# Patient Record
Sex: Female | Born: 1990 | Race: Black or African American | Hispanic: No | Marital: Single | Smoking: Former smoker
Health system: Southern US, Community
[De-identification: ages and names within clinical notes are randomized; demographics above are authoritative.]

## PROBLEM LIST (undated history)

## (undated) ENCOUNTER — Ambulatory Visit (HOSPITAL_COMMUNITY): Payer: Self-pay | Source: Home / Self Care

## (undated) DIAGNOSIS — F419 Anxiety disorder, unspecified: Secondary | ICD-10-CM

## (undated) DIAGNOSIS — E7800 Pure hypercholesterolemia, unspecified: Secondary | ICD-10-CM

## (undated) DIAGNOSIS — Z9221 Personal history of antineoplastic chemotherapy: Secondary | ICD-10-CM

## (undated) DIAGNOSIS — M1990 Unspecified osteoarthritis, unspecified site: Secondary | ICD-10-CM

## (undated) DIAGNOSIS — Z923 Personal history of irradiation: Secondary | ICD-10-CM

## (undated) DIAGNOSIS — G629 Polyneuropathy, unspecified: Secondary | ICD-10-CM

## (undated) DIAGNOSIS — M0890 Juvenile arthritis, unspecified, unspecified site: Secondary | ICD-10-CM

## (undated) DIAGNOSIS — D649 Anemia, unspecified: Secondary | ICD-10-CM

## (undated) HISTORY — DX: Juvenile arthritis, unspecified, unspecified site: M08.90

## (undated) HISTORY — DX: Anemia, unspecified: D64.9

## (undated) HISTORY — DX: Polyneuropathy, unspecified: G62.9

## (undated) HISTORY — PX: DENTAL SURGERY: SHX609

## (undated) HISTORY — DX: Pure hypercholesterolemia, unspecified: E78.00

## (undated) MED FILL — Fosaprepitant Dimeglumine For IV Infusion 150 MG (Base Eq): INTRAVENOUS | Qty: 5 | Status: AC

---
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
## 2011-03-05 LAB — GC/CHLAMYDIA PROBE AMP, GENITAL
Chlamydia, DNA Probe: POSITIVE — AB
GC Probe Amp, Genital: NEGATIVE

## 2011-03-05 LAB — URINE MICROSCOPIC-ADD ON

## 2011-03-05 LAB — URINALYSIS, ROUTINE W REFLEX MICROSCOPIC
Bilirubin Urine: NEGATIVE
Glucose, UA: NEGATIVE mg/dL
Hgb urine dipstick: NEGATIVE
Specific Gravity, Urine: 1.015 (ref 1.005–1.030)
Urobilinogen, UA: 0.2 mg/dL (ref 0.0–1.0)
pH: 7.5 (ref 5.0–8.0)

## 2011-03-05 LAB — WET PREP, GENITAL: Yeast Wet Prep HPF POC: NONE SEEN

## 2013-11-30 ENCOUNTER — Ambulatory Visit (INDEPENDENT_AMBULATORY_CARE_PROVIDER_SITE_OTHER): Payer: Medicaid Other | Admitting: General Practice

## 2013-11-30 ENCOUNTER — Encounter: Payer: Self-pay | Admitting: General Practice

## 2013-11-30 VITALS — BP 106/58 | HR 80 | Temp 98.0°F | Ht 69.25 in | Wt 216.5 lb

## 2013-11-30 DIAGNOSIS — Z0000 Encounter for general adult medical examination without abnormal findings: Secondary | ICD-10-CM

## 2013-11-30 DIAGNOSIS — Z124 Encounter for screening for malignant neoplasm of cervix: Secondary | ICD-10-CM

## 2013-11-30 DIAGNOSIS — Z01419 Encounter for gynecological examination (general) (routine) without abnormal findings: Secondary | ICD-10-CM

## 2013-11-30 LAB — POCT URINALYSIS DIPSTICK
Bilirubin, UA: NEGATIVE
Blood, UA: NEGATIVE
GLUCOSE UA: NEGATIVE
Ketones, UA: NEGATIVE
NITRITE UA: NEGATIVE
Spec Grav, UA: 1.025
UROBILINOGEN UA: NEGATIVE
pH, UA: 6.5

## 2013-11-30 LAB — POCT UA - MICROSCOPIC ONLY
Casts, Ur, LPF, POC: NEGATIVE
Crystals, Ur, HPF, POC: NEGATIVE
RBC, urine, microscopic: NEGATIVE
YEAST UA: NEGATIVE

## 2013-11-30 NOTE — Progress Notes (Signed)
   Subjective:    Patient ID: Gabrielle [name], female    DOB: [birth year], 23 y.o.   MRN: [id number]  HPI Patient presents today for annual exam. She denies any complaints at this time.     Review of Systems  Constitutional: Negative for fever and chills.  Respiratory: Negative for chest tightness and shortness of breath.   Cardiovascular: Negative for chest pain and palpitations.  Genitourinary: Negative for dysuria, frequency, hematuria and difficulty urinating.  Neurological: Negative for dizziness, weakness and headaches.       Objective:   Physical Exam  Constitutional: She is oriented to person, place, and time. She appears well-developed and well-nourished.  HENT:  Head: Normocephalic and atraumatic.  Right Ear: External ear normal.  Left Ear: External ear normal.  Mouth/Throat: Oropharynx is clear and moist.  Eyes: Conjunctivae and EOM are normal. Pupils are equal, round, and reactive to light.  Neck: Normal range of motion. Neck supple. No thyromegaly present.  Cardiovascular: Normal rate, regular rhythm, normal heart sounds and intact distal pulses.   Pulmonary/Chest: Effort normal and breath sounds normal. No respiratory distress. She exhibits no tenderness. Right breast exhibits no inverted nipple, no mass, no nipple discharge, no skin change and no tenderness. Left breast exhibits no inverted nipple, no mass, no nipple discharge, no skin change and no tenderness. Breasts are symmetrical.  Abdominal: Soft. Bowel sounds are normal. She exhibits no distension.  Genitourinary: Vagina normal and uterus normal. No breast swelling, tenderness, discharge or bleeding. No labial fusion. There is no rash, tenderness, lesion or injury on the right labia. There is no rash, tenderness, lesion or injury on the left labia. Uterus is not deviated, not enlarged, not fixed and not tender. Cervix exhibits no motion tenderness, no discharge and no friability. Right adnexum displays no mass, no  tenderness and no fullness. Left adnexum displays no mass, no tenderness and no fullness. No erythema, tenderness or bleeding around the vagina. No foreign body around the vagina. No signs of injury around the vagina. No vaginal discharge found.  Lymphadenopathy:    She has no cervical adenopathy.  Neurological: She is alert and oriented to person, place, and time.  Skin: Skin is warm and dry.  Psychiatric: She has a normal mood and affect.          Assessment & Plan:  1. Annual physical exam - POCT urinalysis dipstick - POCT UA - Microscopic Only - Pap IG, CT/NG w/ reflex HPV when ASC-U -discussed healthy eating and regular exercise RTO prn Patient verbalized understanding Erby Pian, FNP-C

## 2013-11-30 NOTE — Patient Instructions (Signed)

## 2013-12-02 ENCOUNTER — Other Ambulatory Visit: Payer: Self-pay | Admitting: General Practice

## 2013-12-02 DIAGNOSIS — A749 Chlamydial infection, unspecified: Secondary | ICD-10-CM

## 2013-12-02 DIAGNOSIS — N390 Urinary tract infection, site not specified: Secondary | ICD-10-CM

## 2013-12-02 LAB — PAP IG, CT-NG, RFX HPV ASCU
Chlamydia, Nuc. Acid Amp: POSITIVE — AB
Gonococcus by Nucleic Acid Amp: NEGATIVE
PAP Smear Comment: 0

## 2013-12-02 MED ORDER — AZITHROMYCIN 500 MG PO TABS: 1000.0000 mg | ORAL_TABLET | Freq: Once | ORAL | Status: DC

## 2013-12-02 MED ORDER — NITROFURANTOIN MONOHYD MACRO 100 MG PO CAPS: 100.0000 mg | ORAL_CAPSULE | Freq: Two times a day (BID) | ORAL | Status: DC

## 2014-01-28 ENCOUNTER — Telehealth: Payer: Self-pay | Admitting: Family Medicine

## 2014-02-04 ENCOUNTER — Telehealth: Payer: Self-pay | Admitting: General Practice

## 2014-02-04 ENCOUNTER — Ambulatory Visit (INDEPENDENT_AMBULATORY_CARE_PROVIDER_SITE_OTHER): Payer: Medicaid Other | Admitting: Family Medicine

## 2014-02-04 VITALS — BP 101/59 | HR 86 | Temp 98.6°F | Ht 69.25 in | Wt 221.0 lb

## 2014-02-04 DIAGNOSIS — IMO0001 Reserved for inherently not codable concepts without codable children: Secondary | ICD-10-CM

## 2014-02-04 DIAGNOSIS — N898 Other specified noninflammatory disorders of vagina: Secondary | ICD-10-CM

## 2014-02-04 LAB — POCT WET PREP (WET MOUNT)
KOH Wet Prep POC: POSITIVE
Trichomonas Wet Prep HPF POC: NEGATIVE

## 2014-02-04 MED ORDER — FLUCONAZOLE 150 MG PO TABS: 150.0000 mg | ORAL_TABLET | Freq: Once | ORAL | Status: DC

## 2014-02-04 MED ORDER — METRONIDAZOLE 500 MG PO TABS: 500.0000 mg | ORAL_TABLET | Freq: Three times a day (TID) | ORAL | Status: DC

## 2014-02-04 NOTE — Patient Instructions (Signed)

## 2014-02-04 NOTE — Progress Notes (Signed)
   Subjective:    Patient ID: Gabrielle [name], female    DOB: 07-16-1991, 23 y.o.   MRN: [id number]  HPI This 23 y.o. female presents for evaluation of vaginal discharge.  She has concerns about her vagina not being as tight since having her baby.  She is wanting to get her birth control implant changed and needs a referral to obgyn.   Review of Systems C/o vaginal discharge No chest pain, SOB, HA, dizziness, vision change, N/V, diarrhea, constipation, dysuria, urinary urgency or frequency, myalgias, arthralgias or rash.     Objective:   Physical Exam  Vital signs noted  Well developed well nourished female.  HEENT - Head atraumatic Normocephalic                Eyes - PERRLA, Conjuctiva - clear Sclera- Clear EOMI                Ears - EAC's Wnl TM's Wnl Gross Hearing WNL                Throat - oropharanx wnl Respiratory - Lungs CTA bilateral Cardiac - RRR S1 and S2 without murmur GI - Abdomen soft Nontender and bowel sounds active x 4       Assessment & Plan:  Vaginal discharge - Plan: POCT Wet Prep ([place]), metroNIDAZOLE (FLAGYL) 500 MG tablet, fluconazole (DIFLUCAN) 150 MG tablet  Contraception - Plan: Ambulatory referral to Obstetrics / Gynecology  Discussed doing kegel exercises for her vaginal or pelvic changes after child birth.  Lysbeth Penner FNP

## 2014-02-04 NOTE — Telephone Encounter (Signed)
appt made

## 2014-02-19 ENCOUNTER — Emergency Department (HOSPITAL_COMMUNITY)
Admission: EM | Admit: 2014-02-19 | Discharge: 2014-02-19 | Disposition: A | Discharge status: Home / Self Care | Payer: Medicaid Other | Attending: Emergency Medicine | Admitting: Emergency Medicine

## 2014-02-19 ENCOUNTER — Encounter (HOSPITAL_COMMUNITY): Payer: Self-pay | Admitting: Emergency Medicine

## 2014-02-19 DIAGNOSIS — R5381 Other malaise: Secondary | ICD-10-CM | POA: Insufficient documentation

## 2014-02-19 DIAGNOSIS — R5383 Other fatigue: Secondary | ICD-10-CM

## 2014-02-19 DIAGNOSIS — N939 Abnormal uterine and vaginal bleeding, unspecified: Secondary | ICD-10-CM

## 2014-02-19 DIAGNOSIS — R110 Nausea: Secondary | ICD-10-CM | POA: Insufficient documentation

## 2014-02-19 DIAGNOSIS — N390 Urinary tract infection, site not specified: Secondary | ICD-10-CM | POA: Insufficient documentation

## 2014-02-19 DIAGNOSIS — Z3202 Encounter for pregnancy test, result negative: Secondary | ICD-10-CM | POA: Insufficient documentation

## 2014-02-19 DIAGNOSIS — N898 Other specified noninflammatory disorders of vagina: Secondary | ICD-10-CM | POA: Insufficient documentation

## 2014-02-19 LAB — URINALYSIS, ROUTINE W REFLEX MICROSCOPIC
BILIRUBIN URINE: NEGATIVE
Glucose, UA: NEGATIVE mg/dL
Ketones, ur: NEGATIVE mg/dL
LEUKOCYTES UA: NEGATIVE
NITRITE: POSITIVE — AB
PH: 5.5 (ref 5.0–8.0)
Protein, ur: 30 mg/dL — AB
Urobilinogen, UA: 0.2 mg/dL (ref 0.0–1.0)

## 2014-02-19 LAB — URINE MICROSCOPIC-ADD ON

## 2014-02-19 LAB — PREGNANCY, URINE: Preg Test, Ur: NEGATIVE

## 2014-02-19 MED ORDER — ONDANSETRON 8 MG PO TBDP
8.0000 mg | ORAL_TABLET | Freq: Once | ORAL | Status: AC
  Administered 2014-02-19: 8 mg via ORAL
  Filled 2014-02-19: qty 1

## 2014-02-19 MED ORDER — CEPHALEXIN 500 MG PO CAPS: 500.0000 mg | ORAL_CAPSULE | Freq: Four times a day (QID) | ORAL | Status: DC

## 2014-02-19 NOTE — Discharge Instructions (Signed)
Abnormal Uterine Bleeding Abnormal uterine bleeding means bleeding from the vagina that is not your normal menstrual period. This can be:  Bleeding or spotting between periods.  Bleeding after sex (sexual intercourse).  Bleeding that is heavier or more than normal.  Periods that last longer than usual.  Bleeding after menopause. There are many problems that may cause this. Treatment will depend on the cause of the bleeding. Any kind of bleeding that is not normal should be reviewed by your doctor.  HOME CARE Watch your condition for any changes. These actions may lessen any discomfort you are having:  Do not use tampons or douches as told by your doctor.  Change your pads often. You should get regular pelvic exams and Pap tests. Keep all appointments for tests as told by your doctor. GET HELP IF:  You are bleeding for more than 1 week.  You feel dizzy at times. GET HELP RIGHT AWAY IF:   You pass out.  You have to change pads every 15 to 30 minutes.  You have belly pain.  You have a fever.  You become sweaty or weak.  You are passing large blood clots from the vagina.  You feel sick to your stomach (nauseous) and throw up (vomit). MAKE SURE YOU:  Understand these instructions.  Will watch your condition.  Will get help right away if you are not doing well or get worse. Document Released: 09/01/2009 Document Revised: 08/25/2013 Document Reviewed: 06/03/2013 [employer] Patient Information 2014 [employer].  

## 2014-02-19 NOTE — ED Provider Notes (Signed)
CSN: [id number]     Arrival date & time 02/19/14  1823 History  This chart was scribed for Sharyon Cable, MD by Jenne Campus, ED Scribe. This patient was seen in room APA16A/APA16A and the patient's care was started at 7:11 PM.   Chief Complaint  Patient presents with  . Nausea     Patient is a 23 y.o. female presenting with vaginal bleeding. The history is provided by the patient. No language interpreter was used.  Vaginal Bleeding Quality:  Bright red and dark red Severity:  Moderate Onset quality:  Gradual Duration:  2 weeks Timing:  Constant Progression:  Waxing and waning Chronicity:  New Menstrual history:  Regular Context: spontaneously   Associated symptoms: fatigue and nausea   Associated symptoms: no abdominal pain, no dysuria and no fever     HPI Comments: Gabrielle [name] is a 23 y.o. female who presents to the Emergency Department complaining of 2 weeks of constant vaginal bleeding. Pt states that her normal menses starts around the 28th of each month. Pt states that she had a NMP at the end of February, had one week with no bleeding and then started up with the vaginal bleeding once again. She states that the bleeding is slowing down currently but reports that the bleeding will go through cycles of brown blood then back bright red blood the next day. She reports associated nausea and fatigue and expressed concern over a possible pregnancy but denies taking any at home pregnancy tests. She denies any prior episodes of the same but admits that she is currently being treated with a menstrual regulation medication that starts with a "m". She also reports that she was recently treated with antibiotics for a yeast infection and UTI prior to the symptoms onset. She denies any abdominal pain, back pain or dysuria as associated symptoms. She denies any h/o chronic medical conditions.   PMH - none History reviewed. No pertinent past surgical history. Family History  Problem  Relation Age of Onset  . Cancer Maternal Aunt     BREAST  . Cancer Paternal Grandmother     PROSTATE   History  Substance Use Topics  . Smoking status: Never Smoker   . Smokeless tobacco: Not on file  . Alcohol Use: No   No OB history provided.  Review of Systems  Constitutional: Positive for fatigue. Negative for fever.  Gastrointestinal: Positive for nausea. Negative for abdominal pain.  Genitourinary: Positive for vaginal bleeding. Negative for dysuria.  All other systems reviewed and are negative.   Allergies  Review of patient's allergies indicates no known allergies.  Home Medications   Current Outpatient Rx  Name  Route  Sig  Dispense  Refill  . fluconazole (DIFLUCAN) 150 MG tablet   Oral   Take 1 tablet (150 mg total) by mouth once.   2 tablet   1   . metroNIDAZOLE (FLAGYL) 500 MG tablet   Oral   Take 1 tablet (500 mg total) by mouth 3 (three) times daily.   14 tablet   0    Triage Vitals: BP 119/46  Pulse 71  Temp(Src) 97.5 F (36.4 C) (Oral)  Resp 20  SpO2 100%  LMP 02/04/2014  Physical Exam  Nursing note and vitals reviewed.  CONSTITUTIONAL: Well developed/well nourished HEAD: Normocephalic/atraumatic EYES: EOMI/PERRL ENMT: Mucous membranes moist NECK: supple no meningeal signs SPINE:entire spine nontender CV: S1/S2 noted, no murmurs/rubs/gallops noted LUNGS: Lungs are clear to auscultation bilaterally, no apparent distress ABDOMEN: soft, nontender, no  rebound or guarding GU:no cva tenderness NEURO: Pt is awake/alert, moves all extremitiesx4 EXTREMITIES: pulses normal, full ROM SKIN: warm, pallor noted PSYCH: no abnormalities of mood noted  ED Course  Procedures  Medications  ondansetron (ZOFRAN-ODT) disintegrating tablet 8 mg (8 mg Oral Given 02/19/14 1927)    DIAGNOSTIC STUDIES: Oxygen Saturation is 100% on RA, normal by my interpretation.    COORDINATION OF CARE: 7:15 PM-Discussed treatment plan which includes Zofran and UA  with pt at bedside and pt agreed to plan.  Since she is not pregnant, she is refusing all further testing and reqests d/c home I advised need to check HGB due to recent vag bleeding Referred to gynecologist   Labs Review Labs Reviewed  URINALYSIS, [place] - Abnormal; Notable for the following:    Specific Gravity, Urine >1.030 (*)    Hgb urine dipstick LARGE (*)    Protein, ur 30 (*)    Nitrite POSITIVE (*)    All other components within normal limits  URINE MICROSCOPIC-ADD ON - Abnormal; Notable for the following:    Squamous Epithelial / LPF MANY (*)    Bacteria, UA MANY (*)    All other components within normal limits  PREGNANCY, URINE    MDM   Final diagnoses:  Nausea  UTI (lower urinary tract infection)  Vaginal bleeding    Nursing notes including past medical history and social history reviewed and considered in documentation Labs/vital reviewed and considered  I personally performed the services described in this documentation, which was scribed in my presence. The recorded information has been reviewed and is accurate.      Sharyon Cable, MD 02/19/14 [phone]

## 2014-02-19 NOTE — ED Notes (Signed)
Pt c/o intermittent n/v x4-5 days and states "I just don't feel right. I think I might be pregnant".

## 2014-02-19 NOTE — ED Notes (Signed)
Pt refuses the blood draw

## 2014-02-19 NOTE — ED Notes (Signed)
Pt states that she just wants a work note and to find out if shes pregnant bc she feels like she is (frequent urination, not feeling right, nausea, change in period)

## 2014-03-31 ENCOUNTER — Telehealth: Payer: Self-pay | Admitting: Family Medicine

## 2014-04-01 ENCOUNTER — Other Ambulatory Visit: Payer: Self-pay | Admitting: Family Medicine

## 2014-04-01 DIAGNOSIS — N898 Other specified noninflammatory disorders of vagina: Secondary | ICD-10-CM

## 2014-04-01 MED ORDER — METRONIDAZOLE 500 MG PO TABS: 500.0000 mg | ORAL_TABLET | Freq: Three times a day (TID) | ORAL | Status: DC

## 2014-09-26 ENCOUNTER — Telehealth: Payer: Self-pay | Admitting: Nurse Practitioner

## 2014-09-26 NOTE — Telephone Encounter (Signed)
Left message to call back  

## 2014-09-26 NOTE — Telephone Encounter (Signed)
APPT MADE FOR VAG DISCHARGE.

## 2014-10-03 ENCOUNTER — Ambulatory Visit: Payer: Medicaid Other | Admitting: Nurse Practitioner

## 2014-10-05 NOTE — Telephone Encounter (Signed)
Multiple attempts have been made to contact pt, the first line is busy and the 2nd line states the subscriber you are trying to reach is not available, will close encounter

## 2014-11-29 ENCOUNTER — Telehealth: Payer: Self-pay | Admitting: Nurse Practitioner

## 2014-12-08 NOTE — Telephone Encounter (Signed)
Stp regarding this today, she has an appt with MMM 2/9 and will discuss at that appt, offered earlier appt, pt declined, advised pt ok to try and take probiotic otc.

## 2014-12-27 ENCOUNTER — Other Ambulatory Visit: Payer: Medicaid Other | Admitting: Family Medicine

## 2015-02-08 ENCOUNTER — Encounter: Payer: Self-pay | Admitting: Family

## 2015-02-08 ENCOUNTER — Ambulatory Visit (INDEPENDENT_AMBULATORY_CARE_PROVIDER_SITE_OTHER): Payer: Medicaid Other | Admitting: Family

## 2015-02-08 VITALS — BP 116/65 | HR 82 | Temp 98.7°F | Ht 69.25 in | Wt 236.8 lb

## 2015-02-08 DIAGNOSIS — N390 Urinary tract infection, site not specified: Secondary | ICD-10-CM

## 2015-02-08 DIAGNOSIS — N898 Other specified noninflammatory disorders of vagina: Secondary | ICD-10-CM | POA: Diagnosis not present

## 2015-02-08 LAB — POCT WET PREP (WET MOUNT)

## 2015-02-08 LAB — POCT UA - MICROSCOPIC ONLY
CASTS, UR, LPF, POC: NEGATIVE
CRYSTALS, UR, HPF, POC: NEGATIVE
Mucus, UA: NEGATIVE
YEAST UA: NEGATIVE

## 2015-02-08 LAB — POCT URINALYSIS DIPSTICK
BILIRUBIN UA: NEGATIVE
Glucose, UA: NEGATIVE
Ketones, UA: NEGATIVE
NITRITE UA: POSITIVE
PH UA: 6
Spec Grav, UA: >=1.03
UROBILINOGEN UA: NEGATIVE

## 2015-02-08 MED ORDER — SULFAMETHOXAZOLE-TRIMETHOPRIM 800-160 MG PO TABS: 1.0000 | ORAL_TABLET | Freq: Two times a day (BID) | ORAL | Status: DC

## 2015-02-08 NOTE — Patient Instructions (Signed)

## 2015-02-08 NOTE — Progress Notes (Signed)
   Subjective:    Patient ID: Gabrielle [name], female    DOB: [birth year], 24 y.o.   MRN: [id number]  Vaginal Discharge The patient's primary symptoms include a genital odor and vaginal discharge. The patient's pertinent negatives include no genital itching, genital lesions or genital rash. This is a new problem. The current episode started yesterday. The problem occurs constantly. The problem has been waxing and waning. The pain is moderate. The problem affects both sides. Associated symptoms include back pain and frequency. Pertinent negatives include no chills, diarrhea, headaches, hematuria, nausea, sore throat, urgency or vomiting. The vaginal discharge was mucoid. She has tried nothing for the symptoms.      Review of Systems  Constitutional: Negative.  Negative for chills.  HENT: Negative.  Negative for sore throat.   Eyes: Negative.   Respiratory: Negative.  Negative for shortness of breath.   Cardiovascular: Negative.  Negative for palpitations.  Gastrointestinal: Negative.  Negative for nausea, vomiting and diarrhea.  Endocrine: Negative.   Genitourinary: Positive for frequency and vaginal discharge. Negative for urgency and hematuria.  Musculoskeletal: Positive for back pain.  Neurological: Negative.  Negative for headaches.  Hematological: Negative.   Psychiatric/Behavioral: Negative.   All other systems reviewed and are negative.      Objective:   Physical Exam  Constitutional: She is oriented to person, place, and time. She appears well-developed and well-nourished. No distress.  Eyes: Pupils are equal, round, and reactive to light.  Neck: Normal range of motion. Neck supple. No thyromegaly present.  Cardiovascular: Normal rate, regular rhythm, normal heart sounds and intact distal pulses.   No murmur heard. Pulmonary/Chest: Effort normal and breath sounds normal. No respiratory distress. She has no wheezes.  Abdominal: Soft. Bowel sounds are normal. She exhibits no  distension. There is no tenderness.  Musculoskeletal: Normal range of motion. She exhibits no edema or tenderness.  Neurological: She is alert and oriented to person, place, and time. She has normal reflexes. No cranial nerve deficit.  Skin: Skin is warm and dry.  Psychiatric: She has a normal mood and affect. Her behavior is normal. Judgment and thought content normal.  Vitals reviewed.    BP 116/65 mmHg  Pulse 82  Temp(Src) 98.7 F (37.1 C) (Oral)  Ht 5' 9.25" (1.759 m)  Wt 236 lb 12.8 oz (107.412 kg)  BMI 34.72 kg/m2  LMP 01/20/2015 (Approximate)      Assessment & Plan:  1. Vaginal discharge - POCT Wet Prep [facility]) - POCT UA - Microscopic Only - POCT urinalysis dipstick  2. Urinary tract infection without hematuria, site unspecified -Force fluids AZO over the counter X2 days RTO prn - sulfamethoxazole-trimethoprim (BACTRIM DS,SEPTRA DS) 800-160 MG per tablet; Take 1 tablet by mouth 2 (two) times daily.  Dispense: 10 tablet; Refill: 0  Evelina Dun, FNP

## 2015-04-25 ENCOUNTER — Ambulatory Visit: Payer: Medicaid Other | Admitting: Family Medicine

## 2015-04-28 ENCOUNTER — Encounter: Payer: Self-pay | Admitting: Family Medicine

## 2015-09-28 ENCOUNTER — Encounter: Payer: Self-pay | Admitting: Nurse Practitioner

## 2015-09-28 ENCOUNTER — Ambulatory Visit (INDEPENDENT_AMBULATORY_CARE_PROVIDER_SITE_OTHER): Payer: Medicaid Other | Admitting: Nurse Practitioner

## 2015-09-28 ENCOUNTER — Ambulatory Visit: Payer: Medicaid Other | Admitting: Family Medicine

## 2015-09-28 VITALS — BP 115/72 | HR 88 | Temp 97.0°F | Ht 69.0 in | Wt 250.0 lb

## 2015-09-28 DIAGNOSIS — Z09 Encounter for follow-up examination after completed treatment for conditions other than malignant neoplasm: Secondary | ICD-10-CM

## 2015-09-28 DIAGNOSIS — S060X0A Concussion without loss of consciousness, initial encounter: Secondary | ICD-10-CM | POA: Diagnosis not present

## 2015-09-28 DIAGNOSIS — S060X0D Concussion without loss of consciousness, subsequent encounter: Secondary | ICD-10-CM
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
## 2015-09-28 NOTE — Patient Instructions (Signed)
Head Injury, Adult °You have a head injury. Headaches and throwing up (vomiting) are common after a head injury. It should be easy to wake up from sleeping. Sometimes you must stay in the hospital. Most problems happen within the first 24 hours. Side effects may occur up to 7-10 days after the injury.  °WHAT ARE THE TYPES OF HEAD INJURIES? °Head injuries can be as minor as a bump. Some head injuries can be more severe. More severe head injuries include: °· A jarring injury to the brain (concussion). °· A bruise of the brain (contusion). This mean there is bleeding in the brain that can cause swelling. °· A cracked skull (skull fracture). °· Bleeding in the brain that collects, clots, and forms a bump (hematoma). °WHEN SHOULD I GET HELP RIGHT AWAY?  °· You are confused or sleepy. °· You cannot be woken up. °· You feel sick to your stomach (nauseous) or keep throwing up (vomiting). °· Your dizziness or unsteadiness is getting worse. °· You have very bad, lasting headaches that are not helped by medicine. Take medicines only as told by your doctor. °· You cannot use your arms or legs like normal. °· You cannot walk. °· You notice changes in the black spots in the center of the colored part of your eye (pupil). °· You have clear or bloody fluid coming from your nose or ears. °· You have trouble seeing. °During the next 24 hours after the injury, you must stay with someone who can watch you. This person should get help right away (call 911 in the [place] if you start to shake and are not able to control it (have seizures), you pass out, or you are unable to wake up. °HOW CAN I PREVENT A HEAD INJURY IN THE FUTURE? °· Wear seat belts. °· Wear a helmet while bike riding and playing sports like football. °· Stay away from dangerous activities around the house. °WHEN CAN I RETURN TO NORMAL ACTIVITIES AND ATHLETICS? °See your doctor before doing these activities. You should not do normal activities or play contact sports until 1  week after the following symptoms have stopped: °· Headache that does not go away. °· Dizziness. °· Poor attention. °· Confusion. °· Memory problems. °· Sickness to your stomach or throwing up. °· Tiredness. °· Fussiness. °· Bothered by bright lights or loud noises. °· Anxiousness or depression. °· Restless sleep. °MAKE SURE YOU:  °· Understand these instructions. °· Will watch your condition. °· Will get help right away if you are not doing well or get worse. °  °This information is not intended to replace advice given to you by your health care provider. Make sure you discuss any questions you have with your health care provider. °  °Document Released: 10/17/2008 Document Revised: 11/25/2014 Document Reviewed: 07/12/2013 °Elsevier Interactive Patient Education ©2016 [employer] ° °

## 2016-04-11 ENCOUNTER — Telehealth: Payer: Self-pay | Admitting: Family Medicine

## 2016-04-11 ENCOUNTER — Encounter (INDEPENDENT_AMBULATORY_CARE_PROVIDER_SITE_OTHER): Payer: Self-pay

## 2016-04-11 NOTE — Telephone Encounter (Signed)
Pt is concerned about how much hair is falling out and wants to know why it's happening. Advised pt to discuss with the provider tomorrow at her appt. Pt voiced understanding.

## 2016-04-12 ENCOUNTER — Ambulatory Visit: Payer: Medicaid Other | Admitting: Family Medicine

## 2016-04-16 ENCOUNTER — Encounter: Payer: Self-pay | Admitting: Family Medicine

## 2016-05-22 ENCOUNTER — Other Ambulatory Visit: Payer: Self-pay | Admitting: Family Medicine

## 2016-05-23 ENCOUNTER — Other Ambulatory Visit: Payer: Self-pay | Admitting: *Deleted

## 2016-05-23 ENCOUNTER — Telehealth: Payer: Self-pay | Admitting: Family Medicine

## 2016-05-23 MED ORDER — FLUCONAZOLE 150 MG PO TABS: 150.0000 mg | ORAL_TABLET | Freq: Once | ORAL | Status: DC

## 2016-05-23 NOTE — Telephone Encounter (Signed)
Sent to [facility] in [place]

## 2016-05-23 NOTE — Telephone Encounter (Signed)
At pharm - NA / BUSY on pt call

## 2016-05-23 NOTE — Telephone Encounter (Signed)
Pt will contact OB

## 2016-05-23 NOTE — Telephone Encounter (Signed)
She will need to call her OB to talk to them about the next treatment. They may say it is OK to take the fluconazole.

## 2016-05-23 NOTE — Telephone Encounter (Signed)
Can you call pt with below? Thanks

## 2016-05-23 NOTE — Telephone Encounter (Signed)
Needs to call OB and see what they suggest.

## 2016-07-12 ENCOUNTER — Encounter (HOSPITAL_COMMUNITY): Payer: Self-pay | Admitting: Gastroenterology

## 2016-07-12 ENCOUNTER — Emergency Department (HOSPITAL_COMMUNITY)
Admission: EM | Admit: 2016-07-12 | Discharge: 2016-07-12 | Disposition: A | Discharge status: Home / Self Care | Payer: Medicaid Other | Attending: Emergency Medicine | Admitting: Emergency Medicine

## 2016-07-12 DIAGNOSIS — S39012A Strain of muscle, fascia and tendon of lower back, initial encounter: Secondary | ICD-10-CM | POA: Diagnosis not present

## 2016-07-12 DIAGNOSIS — X500XXA Overexertion from strenuous movement or load, initial encounter: Secondary | ICD-10-CM | POA: Diagnosis not present

## 2016-07-12 DIAGNOSIS — O9A212 Injury, poisoning and certain other consequences of external causes complicating pregnancy, second trimester: Secondary | ICD-10-CM | POA: Diagnosis not present

## 2016-07-12 DIAGNOSIS — Y990 Civilian activity done for income or pay: Secondary | ICD-10-CM | POA: Diagnosis not present

## 2016-07-12 DIAGNOSIS — Z3A15 15 weeks gestation of pregnancy: Secondary | ICD-10-CM | POA: Diagnosis not present

## 2016-07-12 DIAGNOSIS — Y929 Unspecified place or not applicable: Secondary | ICD-10-CM | POA: Insufficient documentation

## 2016-07-12 DIAGNOSIS — Y93F2 Activity, caregiving, lifting: Secondary | ICD-10-CM | POA: Insufficient documentation

## 2016-07-12 LAB — URINALYSIS, ROUTINE W REFLEX MICROSCOPIC
Bilirubin Urine: NEGATIVE
GLUCOSE, UA: NEGATIVE mg/dL
Hgb urine dipstick: NEGATIVE
KETONES UR: NEGATIVE mg/dL
NITRITE: NEGATIVE
PH: 8 (ref 5.0–8.0)
Protein, ur: NEGATIVE mg/dL
SPECIFIC GRAVITY, URINE: 1.02 (ref 1.005–1.030)

## 2016-07-12 LAB — URINE MICROSCOPIC-ADD ON: RBC / HPF: NONE SEEN RBC/hpf (ref 0–5)

## 2016-07-12 MED ORDER — ACETAMINOPHEN 325 MG PO TABS
650.0000 mg | ORAL_TABLET | Freq: Once | ORAL | Status: AC
  Administered 2016-07-12: 650 mg via ORAL
  Filled 2016-07-12: qty 2

## 2016-07-12 NOTE — ED Triage Notes (Signed)
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 PT ambulatory from triage with NAD and denies any urinary symptoms. PT also states she is [redacted] weeks pregnant and sees Dr. Adah Perl in [place] as her OBGYN. PT denies any vaginal discharge.

## 2016-07-12 NOTE — ED Notes (Signed)
FHR 184

## 2016-07-12 NOTE — ED Notes (Signed)
Gabrielle [name] at bedside. 

## 2016-07-12 NOTE — Discharge Instructions (Signed)
Alternate ice and heat to your lower back.  Tylenol every 4 hrs as needed.  Follow-up with your OB doctor or return to ER for any worsening symptoms

## 2016-07-12 NOTE — ED Provider Notes (Signed)
[facility] DEPT Provider Note   CSN: [id number] Arrival date & time: 07/12/16  1002     History   Chief Complaint Chief Complaint  Patient presents with  . Back Pain    HPI Gabrielle [name] is a 25 y.o. female.  HPI   Gabrielle [name] is a 25 y.o. female who is [redacted] weeks pregnant, G2 P1Ab0 presents to the Emergency Department complaining of Diffuse low back pain for 2 days. She reports the pain began after pulling and heavy lifting. Pain is exacerbated by certain movements and improves at rest. She denies any radiation of pain, abdominal pain, urine or bowel changes, numbness or weakness to the lower extremities, and vaginal bleeding. She has not taken any medications or tried any therapies for symptomatically relief. Patient reports pregnancy without complications, has an appointment with her OB/GYN on September 21 and currently has routine prenatal care.   History reviewed. No pertinent past medical history.  There are no active problems to display for this patient.   Past Surgical History:  Procedure Laterality Date  . DENTAL SURGERY      OB History    Gravida Para Term Preterm AB Living   3         1   SAB TAB Ectopic Multiple Live Births                  Obstetric Comments   PT states [redacted] weeks gestation       Home Medications    Prior to Admission medications   Medication Sig Start Date End Date Taking? Authorizing Provider  Prenatal Vit-Fe Fumarate-FA (PRENATAL MULTIVITAMIN) TABS tablet Take 1 tablet by mouth daily at 12 noon.   Yes Historical Provider, MD  fluconazole (DIFLUCAN) 150 MG tablet Take 1 tablet (150 mg total) by mouth once. Patient not taking: Reported on 07/12/2016 05/23/16   Eustaquio Maize, MD    Family History Family History  Problem Relation Age of Onset  . Cancer Maternal Aunt     BREAST  . Cancer Paternal Grandmother     PROSTATE    Social History Social History  Substance Use Topics  . Smoking status: Never Smoker  . Smokeless  tobacco: Never Used  . Alcohol use No     Allergies   Review of patient's allergies indicates no known allergies.   Review of Systems Review of Systems  Constitutional: Negative for fever.  Respiratory: Negative for shortness of breath.   Gastrointestinal: Negative for abdominal pain, constipation and vomiting.  Genitourinary: Negative for decreased urine volume, difficulty urinating, dysuria, flank pain and hematuria.  Musculoskeletal: Positive for back pain. Negative for joint swelling.  Skin: Negative for rash.  Neurological: Negative for weakness and numbness.  All other systems reviewed and are negative.    Physical Exam Updated Vital Signs BP 115/70 (BP Location: Right Arm)   Pulse 75   Temp 98 F (36.7 C) (Oral)   Resp 18   Ht 5\' 10"  (1.778 m)   Wt 113.4 kg   SpO2 99%   BMI 35.87 kg/m   Physical Exam  Constitutional: She is oriented to person, place, and time. She appears well-developed and well-nourished. No distress.  HENT:  Head: Normocephalic and atraumatic.  Neck: Normal range of motion. Neck supple.  Cardiovascular: Normal rate, regular rhythm, normal heart sounds and intact distal pulses.   No murmur heard. Pulmonary/Chest: Effort normal and breath sounds normal. No respiratory distress.  Abdominal: Soft. She exhibits no distension. There is no  tenderness.  Musculoskeletal: She exhibits tenderness. She exhibits no edema.       Lumbar back: She exhibits tenderness and pain. She exhibits normal range of motion, no swelling, no deformity, no laceration and normal pulse.  ttp of the lumbar bilateral paraspinal muscles.  No spinal tenderness.  DP pulses are brisk and symmetrical.  Distal sensation intact.  Pt has 5/5 strength against resistance of bilateral lower extremities.     Neurological: She is alert and oriented to person, place, and time. She has normal strength. No sensory deficit. She exhibits normal muscle tone. Coordination and gait normal.    Reflex Scores:      Patellar reflexes are 2+ on the right side and 2+ on the left side.      Achilles reflexes are 2+ on the right side and 2+ on the left side. Skin: Skin is warm and dry. No rash noted.  Nursing note and vitals reviewed.    ED Treatments / Results  Labs (all labs ordered are listed, but only abnormal results are displayed) Labs Reviewed  URINALYSIS, ROUTINE W REFLEX MICROSCOPIC (NOT AT [facility]) - Abnormal; Notable for the following:       Result Value   APPearance HAZY (*)    Leukocytes, UA SMALL (*)    All other components within normal limits  URINE MICROSCOPIC-ADD ON - Abnormal; Notable for the following:    Squamous Epithelial / LPF 0-5 (*)    Bacteria, UA MANY (*)    All other components within normal limits    EKG  EKG Interpretation None       Radiology No results found.  Procedures Procedures (including critical care time)  Medications Ordered in ED Medications  acetaminophen (TYLENOL) tablet 650 mg (650 mg Oral Given 07/12/16 1115)     Initial Impression / Assessment and Plan / ED Course  I have reviewed the triage vital signs and the nursing notes.  Pertinent labs & imaging results that were available during my care of the patient were reviewed by me and considered in my medical decision making (see chart for details).  Clinical Course   FHT's 60  Pt is well appearing.  Ambulates with a steady gait.  Low back pain reproduced with palpation and movement. No focal neuro deficits.  No concerning sx's for emergent neurological or infectious process. Agrees to symptomatic tx and PMD f/u  Final Clinical Impressions(s) / ED Diagnoses   Final diagnoses:  Lumbar strain, initial encounter    New Prescriptions New Prescriptions   No medications on file     Kem Parkinson, PA-C 07/13/16 [place], MD 07/14/16 [phone]

## 2017-05-05 ENCOUNTER — Ambulatory Visit: Payer: Medicaid Other | Admitting: Family Medicine

## 2017-05-06 ENCOUNTER — Ambulatory Visit: Payer: Medicaid Other | Admitting: Family Medicine

## 2017-05-06 ENCOUNTER — Encounter: Payer: Self-pay | Admitting: Family Medicine

## 2017-05-07 ENCOUNTER — Telehealth: Payer: Self-pay | Admitting: Family Medicine

## 2019-10-26 ENCOUNTER — Encounter (HOSPITAL_COMMUNITY): Payer: Self-pay | Admitting: Emergency Medicine

## 2019-10-26 ENCOUNTER — Emergency Department (HOSPITAL_COMMUNITY)
Admission: EM | Admit: 2019-10-26 | Discharge: 2019-10-26 | Disposition: A | Discharge status: Home / Self Care | Payer: Medicaid Other | Attending: Emergency Medicine | Admitting: Emergency Medicine

## 2019-10-26 ENCOUNTER — Other Ambulatory Visit: Payer: Self-pay

## 2019-10-26 DIAGNOSIS — M549 Dorsalgia, unspecified: Secondary | ICD-10-CM | POA: Diagnosis present

## 2019-10-26 MED ORDER — DEXAMETHASONE 4 MG PO TABS
8.0000 mg | ORAL_TABLET | Freq: Once | ORAL | Status: AC
  Administered 2019-10-26: 08:00:00 8 mg via ORAL
  Filled 2019-10-26: qty 2

## 2019-10-26 MED ORDER — OXYCODONE-ACETAMINOPHEN 5-325 MG PO TABS
1.0000 | ORAL_TABLET | Freq: Once | ORAL | Status: AC
  Administered 2019-10-26: 1 via ORAL
  Filled 2019-10-26: qty 1

## 2019-10-26 MED ORDER — IBUPROFEN 200 MG PO TABS
600.0000 mg | ORAL_TABLET | Freq: Once | ORAL | Status: AC
  Administered 2019-10-26: 600 mg via ORAL
  Filled 2019-10-26: qty 3

## 2019-10-26 MED ORDER — DEXAMETHASONE 4 MG PO TABS: 4.0000 mg | ORAL_TABLET | Freq: Two times a day (BID) | ORAL | 0 refills | Status: DC

## 2019-10-26 MED ORDER — IBUPROFEN 600 MG PO TABS: 600.0000 mg | ORAL_TABLET | Freq: Four times a day (QID) | ORAL | 0 refills | Status: DC | PRN

## 2019-10-26 NOTE — ED Triage Notes (Signed)
Pt reports for couple months having left upper back pains. Reports that she lifts at work. Denies any injuries. Using icy hot and tylenol without relief

## 2019-10-26 NOTE — ED Provider Notes (Signed)
[facility] DEPT Provider Note   CSN: [id number] Arrival date & time: 10/26/19  0719     History   Chief Complaint Chief Complaint  Patient presents with  . Back Pain    HPI Gabrielle [name] is a 28 y.o. female.     HPI   28 year old female with back pain.  Upper back somewhat worse on the left side.  Pain began approximately 3 months ago.  Is fairly constant.  Seems to be the worst when she wakes up in the morning.  Worse with movement.  She is tried taking Tylenol and used icy hot with minimal improvement.  She did begin a new job approximately 6 months ago at [employer] and reports that she does regular lifting at this position.  Denies any specific trauma/strain.  No acute respiratory complaints.  Denies any prior history of back problems.  History reviewed. No pertinent past medical history.  There are no active problems to display for this patient.   Past Surgical History:  Procedure Laterality Date  . DENTAL SURGERY       OB History    Gravida  3   Para      Term      Preterm      AB      Living  1     SAB      TAB      Ectopic      Multiple      Live Births           Obstetric Comments  PT states [redacted] weeks gestation         Home Medications    Prior to Admission medications   Medication Sig Start Date End Date Taking? Authorizing Provider  dexamethasone (DECADRON) 4 MG tablet Take 1 tablet (4 mg total) by mouth 2 (two) times daily. 10/26/19   Virgel Manifold, MD  fluconazole (DIFLUCAN) 150 MG tablet Take 1 tablet (150 mg total) by mouth once. Patient not taking: Reported on 07/12/2016 05/23/16   Eustaquio Maize, MD  ibuprofen (ADVIL) 600 MG tablet Take 1 tablet (600 mg total) by mouth every 6 (six) hours as needed. 10/26/19   Virgel Manifold, MD  Prenatal Vit-Fe Fumarate-FA (PRENATAL MULTIVITAMIN) TABS tablet Take 1 tablet by mouth daily at 12 noon.    [provider]    Family History Family  History  Problem Relation Age of Onset  . Cancer Maternal Aunt        BREAST  . Cancer Paternal Grandmother        PROSTATE    Social History Social History   Tobacco Use  . Smoking status: Never Smoker  . Smokeless tobacco: Never Used  Substance Use Topics  . Alcohol use: No  . Drug use: No     Allergies   Patient has no known allergies.   Review of Systems Review of Systems  All systems reviewed and negative, other than as noted in HPI.  Physical Exam Updated Vital Signs BP 112/83   Pulse 70   Temp 98.3 F (36.8 C) (Oral)   Resp 15   LMP 10/18/2019   SpO2 100%   Breastfeeding Unknown   Physical Exam Vitals signs and nursing note reviewed.  Constitutional:      General: She is not in acute distress.    Appearance: She is well-developed.  HENT:     Head: Normocephalic and atraumatic.  Eyes:     General:  Right eye: No discharge.        Left eye: No discharge.     Conjunctiva/sclera: Conjunctivae normal.  Neck:     Musculoskeletal: Neck supple.  Cardiovascular:     Rate and Rhythm: Normal rate and regular rhythm.     Heart sounds: Normal heart sounds. No murmur. No friction rub. No gallop.   Pulmonary:     Effort: Pulmonary effort is normal. No respiratory distress.     Breath sounds: Normal breath sounds.  Abdominal:     General: There is no distension.     Palpations: Abdomen is soft.     Tenderness: There is no abdominal tenderness.  Musculoskeletal:     Comments: Back is grossly normal to inspection.  Mild tenderness to palpation of the left trapezius musculature.  No appreciable midline tenderness.  Breath sounds clear/symmetric bilaterally.  Skin:    General: Skin is warm and dry.  Neurological:     Mental Status: She is alert.  Psychiatric:        Behavior: Behavior normal.        Thought Content: Thought content normal.      ED Treatments / Results  Labs (all labs ordered are listed, but only abnormal results are displayed)  Labs Reviewed - No data to display  EKG EKG Interpretation  Date/Time:  Tuesday October 26 2019 07:28:16 EST Ventricular Rate:  68 PR Interval:    QRS Duration: 71 QT Interval:  409 QTC Calculation: 435 R Axis:   73 Text Interpretation: Sinus rhythm Non-specific ST-t changes No old tracing to compare Confirmed by Virgel Manifold [id number]) on 10/26/2019 7:38:48 AM   Radiology No results found.  Procedures Procedures (including critical care time)  Medications Ordered in ED Medications  oxyCODONE-acetaminophen (PERCOCET/ROXICET) 5-325 MG per tablet 1 tablet (has no administration in time range)  ibuprofen (ADVIL) tablet 600 mg (has no administration in time range)  dexamethasone (DECADRON) tablet 8 mg (has no administration in time range)     Initial Impression / Assessment and Plan / ED Course  I have reviewed the triage vital signs and the nursing notes.  Pertinent labs & imaging results that were available during my care of the patient were reviewed by me and considered in my medical decision making (see chart for details).        28 year old female with 2 to 31-month history of upper back pain.  Seems muscular in nature.  Possibly related to lifting at her job.  I doubt emergent etiology.  Imaging likely of little utility.  Plan symptomatic treatment.  Return precautions discussed.  Outpatient follow-up otherwise.  Final Clinical Impressions(s) / ED Diagnoses   Final diagnoses:  Upper back pain    ED Discharge Orders         Ordered    dexamethasone (DECADRON) 4 MG tablet  2 times daily     10/26/19 0747    ibuprofen (ADVIL) 600 MG tablet  Every 6 hours PRN     10/26/19 0747           Virgel Manifold, MD 10/26/19 [phone]

## 2020-04-01 ENCOUNTER — Other Ambulatory Visit: Payer: Self-pay

## 2020-04-01 ENCOUNTER — Ambulatory Visit
Admission: EM | Admit: 2020-04-01 | Discharge: 2020-04-01 | Disposition: A | Discharge status: Home / Self Care | Payer: Medicaid Other | Attending: Physician Assistant | Admitting: Physician Assistant

## 2020-04-01 DIAGNOSIS — M722 Plantar fascial fibromatosis: Secondary | ICD-10-CM | POA: Diagnosis not present

## 2020-04-01 MED ORDER — METHYLPREDNISOLONE 4 MG PO TBPK: ORAL_TABLET | ORAL | 0 refills | Status: DC

## 2020-04-01 NOTE — ED Triage Notes (Signed)
Patient presents for evaluation of right foot that she states feels tingly and numb on the ball of her foot when she walks for over one month.  She also notes right ankle swelling off/on.  Home interventions have not provided much relief.

## 2020-04-01 NOTE — Discharge Instructions (Signed)
Start medrol pack as directed. Ice compress, stretches. Follow up with ortho/sports medicine if symptoms not improving.

## 2020-04-01 NOTE — ED Provider Notes (Signed)
[name]    CSN: [id number] Arrival date & time: 04/01/20  0945      History   Chief Complaint Chief Complaint  Patient presents with  . Foot Pain    right    HPI Gabrielle [name] is a 29 y.o. female.   29 year old female comes in for 1 month history of right foot pain. Denies injury/trauma. Has ankle swelling that is intermittent. Pain to the plantar surface that is worse first thing in the morning. Work requires long hour of standing and walking. Taking ibuprofen 200-400mg  QD. Tylenol without relief.     History reviewed. No pertinent past medical history.  There are no problems to display for this patient.   Past Surgical History:  Procedure Laterality Date  . DENTAL SURGERY      OB History    Gravida  3   Para      Term      Preterm      AB      Living  1     SAB      TAB      Ectopic      Multiple      Live Births           Obstetric Comments  PT states [redacted] weeks gestation         Home Medications    Prior to Admission medications   Medication Sig Start Date End Date Taking? Authorizing Provider  fluconazole (DIFLUCAN) 150 MG tablet Take 1 tablet (150 mg total) by mouth once. Patient not taking: Reported on 07/12/2016 05/23/16   Eustaquio Maize, MD  ibuprofen (ADVIL) 600 MG tablet Take 1 tablet (600 mg total) by mouth every 6 (six) hours as needed. 10/26/19   Virgel Manifold, MD  methylPREDNISolone (MEDROL DOSEPAK) 4 MG TBPK tablet Follow pack direction 04/01/20   Ok Edwards, PA-C  Prenatal Vit-Fe Fumarate-FA (PRENATAL MULTIVITAMIN) TABS tablet Take 1 tablet by mouth daily at 12 noon.    [provider]    Family History Family History  Problem Relation Age of Onset  . Cancer Maternal Aunt        BREAST  . Cancer Paternal Grandmother        PROSTATE    Social History Social History   Tobacco Use  . Smoking status: Never Smoker  . Smokeless tobacco: Never Used  Substance Use Topics  . Alcohol use: No  .  Drug use: No     Allergies   Patient has no known allergies.   Review of Systems Review of Systems  Reason unable to perform ROS: See HPI as above.     Physical Exam Triage Vital Signs ED Triage Vitals  Enc Vitals Group     BP 04/01/20 1010 123/85     Pulse Rate 04/01/20 1010 77     Resp 04/01/20 1010 15     Temp 04/01/20 1010 98.3 F (36.8 C)     Temp Source 04/01/20 1010 Oral     SpO2 04/01/20 1010 99 %     Weight --      Height --      Head Circumference --      Peak Flow --      Pain Score 04/01/20 1011 9     Pain Loc --      Pain Edu? --      Excl. in [place]? --    No data found.  Updated Vital Signs BP  123/85 (BP Location: Left Arm)   Pulse 77   Temp 98.3 F (36.8 C) (Oral)   Resp 15   LMP 03/20/2020 (Approximate)   SpO2 99%   Visual Acuity Right Eye Distance:   Left Eye Distance:   Bilateral Distance:    Right Eye Near:   Left Eye Near:    Bilateral Near:     Physical Exam Constitutional:      General: She is not in acute distress.    Appearance: Normal appearance. She is well-developed. She is not toxic-appearing or diaphoretic.  HENT:     Head: Normocephalic and atraumatic.  Eyes:     Conjunctiva/sclera: Conjunctivae normal.     Pupils: Pupils are equal, round, and reactive to light.  Pulmonary:     Effort: Pulmonary effort is normal. No respiratory distress.     Comments: Speaking in full sentences without difficulty Musculoskeletal:     Cervical back: Normal range of motion and neck supple.     Comments: No swelling, erythema, warmth, contusion seen.  No tenderness to palpation of ankle or foot.  Tenderness to palpation along plantar surface.  Full range of motion of ankle and toes.  Strength 5/5. Sensation intact and equal bilaterally. Pedal pulse 2+  Skin:    General: Skin is warm and dry.  Neurological:     Mental Status: She is alert and oriented to person, place, and time.      UC Treatments / Results  Labs (all labs ordered  are listed, but only abnormal results are displayed) Labs Reviewed - No data to display  EKG   Radiology No results found.  Procedures Procedures (including critical care time)  Medications Ordered in UC Medications - No data to display  Initial Impression / Assessment and Plan / UC Course  I have reviewed the triage vital signs and the nursing notes.  Pertinent labs & imaging results that were available during my care of the patient were reviewed by me and considered in my medical decision making (see chart for details).    History and exam consistent with plantar fasciitis.  Discussed NSAIDs versus corticosteroid.  Patient would like to start corticosteroid at this time.  Medrol pack as directed.  Other symptomatic management discussed, stretches discussed.  Return precautions given.  Patient expresses understanding and agrees to plan.  Final Clinical Impressions(s) / UC Diagnoses   Final diagnoses:  Plantar fasciitis of right foot    ED Prescriptions    Medication Sig Dispense Auth. Provider   methylPREDNISolone (MEDROL DOSEPAK) 4 MG TBPK tablet Follow pack direction 21 tablet Ok Edwards, PA-C     PDMP not reviewed this encounter.   Ok Edwards, PA-C 04/01/20 1051

## 2020-04-10 ENCOUNTER — Emergency Department (HOSPITAL_COMMUNITY): Payer: Medicaid Other

## 2020-04-10 ENCOUNTER — Emergency Department (HOSPITAL_COMMUNITY)
Admission: EM | Admit: 2020-04-10 | Discharge: 2020-04-10 | Disposition: A | Discharge status: Home / Self Care | Payer: Medicaid Other | Attending: Emergency Medicine | Admitting: Emergency Medicine

## 2020-04-10 ENCOUNTER — Other Ambulatory Visit: Payer: Self-pay

## 2020-04-10 ENCOUNTER — Encounter (HOSPITAL_COMMUNITY): Payer: Self-pay

## 2020-04-10 DIAGNOSIS — M79671 Pain in right foot: Secondary | ICD-10-CM | POA: Insufficient documentation

## 2020-04-10 DIAGNOSIS — M79673 Pain in unspecified foot: Secondary | ICD-10-CM

## 2020-04-10 MED ORDER — NAPROXEN 500 MG PO TABS: 500.0000 mg | ORAL_TABLET | Freq: Two times a day (BID) | ORAL | 0 refills | Status: DC

## 2020-04-10 NOTE — ED Triage Notes (Signed)
Pt c/o right foot pain and numb pinky toe for over a month. Seen by UC and for steroid rx.

## 2020-04-10 NOTE — Discharge Instructions (Addendum)
Recommend taking NSAIDs such as the prescribed naproxen for pain control.  Recommend recheck with orthopedic doctor.  Recommend return to ER if you develop such as worsening pain, numbness, swelling or skin color changes.

## 2020-04-10 NOTE — ED Provider Notes (Signed)
[facility] DEPT Provider Note   CSN: [id number] Arrival date & time: 04/10/20  1843     History Chief Complaint  Patient presents with  . Foot Pain    Gabrielle [name] is a 29 y.o. female.  Presents ER chief complaint foot pain.  Patient reports that over the past month she has been having dull achy right foot pain, states is worse when walking but is able to walk without any significant difficulty.  No known injuries.  Went to urgent care and states that she was given steroid which she has completed.  States that she also has pain and tingling sensation in her pinky and fourth toes.  Has not noted any skin color changes, no swelling.  Has not taken any other medications today for her pain.  HPI     History reviewed. No pertinent past medical history.  There are no problems to display for this patient.   Past Surgical History:  Procedure Laterality Date  . DENTAL SURGERY       OB History    Gravida  3   Para      Term      Preterm      AB      Living  1     SAB      TAB      Ectopic      Multiple      Live Births           Obstetric Comments  PT states [redacted] weeks gestation        Family History  Problem Relation Age of Onset  . Cancer Maternal Aunt        BREAST  . Cancer Paternal Grandmother        PROSTATE    Social History   Tobacco Use  . Smoking status: Never Smoker  . Smokeless tobacco: Never Used  Substance Use Topics  . Alcohol use: No  . Drug use: No    Home Medications Prior to Admission medications   Medication Sig Start Date End Date Taking? Authorizing Provider  fluconazole (DIFLUCAN) 150 MG tablet Take 1 tablet (150 mg total) by mouth once. Patient not taking: Reported on 07/12/2016 05/23/16   Eustaquio Maize, MD  ibuprofen (ADVIL) 600 MG tablet Take 1 tablet (600 mg total) by mouth every 6 (six) hours as needed. 10/26/19   Virgel Manifold, MD  methylPREDNISolone (MEDROL DOSEPAK) 4 MG TBPK tablet  Follow pack direction 04/01/20   Ok Edwards, PA-C  Prenatal Vit-Fe Fumarate-FA (PRENATAL MULTIVITAMIN) TABS tablet Take 1 tablet by mouth daily at 12 noon.    [provider]    Allergies    Patient has no known allergies.  Review of Systems   Review of Systems  Constitutional: Negative for chills and fever.  HENT: Negative for ear pain and sore throat.   Eyes: Negative for pain and visual disturbance.  Respiratory: Negative for cough and shortness of breath.   Cardiovascular: Negative for chest pain and palpitations.  Gastrointestinal: Negative for abdominal pain and vomiting.  Genitourinary: Negative for dysuria and hematuria.  Musculoskeletal: Positive for arthralgias. Negative for back pain.  Skin: Negative for color change and rash.  Neurological: Negative for seizures and syncope.  All other systems reviewed and are negative.   Physical Exam Updated Vital Signs BP 104/70 (BP Location: Left Arm)   Pulse 78   Temp 98.1 F (36.7 C) (Oral)   Resp 16   Ht  5\' 9"  (1.753 m)   Wt 97.5 kg   LMP 03/20/2020 (Approximate)   SpO2 97%   BMI 31.75 kg/m   Physical Exam Vitals and nursing note reviewed.  Constitutional:      Appearance: Normal appearance.  HENT:     Head: Normocephalic and atraumatic.     Nose: Nose normal.  Eyes:     Extraocular Movements: Extraocular movements intact.     Pupils: Pupils are equal, round, and reactive to light.  Cardiovascular:     Rate and Rhythm: Normal rate.     Pulses: Normal pulses.  Pulmonary:     Effort: Pulmonary effort is normal. No respiratory distress.  Musculoskeletal:     Cervical back: Normal range of motion.     Comments: Right lower extremity: Right foot appears normal, there is no focal tenderness on careful palpation, range of motion, sensation to light touch intact throughout foot, toes, normal DP and PT pulses  Skin:    General: Skin is warm and dry.     Capillary Refill: Capillary refill takes less than 2  seconds.  Neurological:     General: No focal deficit present.     Mental Status: She is oriented to person, place, and time.     ED Results / Procedures / Treatments   Labs (all labs ordered are listed, but only abnormal results are displayed) Labs Reviewed - No data to display  EKG None  Radiology DG Foot Complete Right  Result Date: 04/10/2020 CLINICAL DATA:  29 year old female with history of right-sided foot pain. EXAM: RIGHT FOOT COMPLETE - 3+ VIEW COMPARISON:  No priors. FINDINGS: There is no evidence of fracture or dislocation. There is no evidence of arthropathy or other focal bone abnormality. Soft tissues are unremarkable. IMPRESSION: Negative. Electronically Signed   By: Vinnie Langton M.D.   On: 04/10/2020 19:27    Procedures Procedures (including critical care time)  Medications Ordered in ED Medications - No data to display  ED Course  I have reviewed the triage vital signs and the nursing notes.  Pertinent labs & imaging results that were available during my care of the patient were reviewed by me and considered in my medical decision making (see chart for details).    MDM Rules/Calculators/A&P                      29 year old lady presented to ER with right foot pain.  No associated trauma, ongoing for the past month.  X-rays today were negative.  No physical exam abnormality noted on my careful inspection of her foot.  Given the normal exam and the negative x-ray, do not feel patient needs any additional work-up today.  Suspect may have ligamentous strain or other MSK strain.  Recommend that she try course of NSAIDs, follow-up with Ortho.    After the discussed management above, the patient was determined to be safe for discharge.  The patient was in agreement with this plan and all questions regarding their care were answered.  ED return precautions were discussed and the patient will return to the ED with any significant worsening of condition.   Final  Clinical Impression(s) / ED Diagnoses Final diagnoses:  Foot pain  Foot pain, right    Rx / DC Orders ED Discharge Orders    None       Lucrezia Starch, MD 04/10/20 2146

## 2020-04-11 ENCOUNTER — Telehealth: Payer: Self-pay | Admitting: *Deleted

## 2020-04-11 NOTE — Telephone Encounter (Signed)
Pt called regarding which pharmacy Rx was e-scribed to.  RNCM reviewed chart to access After Visit Summary and found that Rx was not written.  Advised pt to contact PCP as the EDP advised NSAIDs for pain.

## 2020-04-13 ENCOUNTER — Other Ambulatory Visit: Payer: Self-pay

## 2020-04-13 ENCOUNTER — Ambulatory Visit
Admission: EM | Admit: 2020-04-13 | Discharge: 2020-04-13 | Disposition: A | Discharge status: Home / Self Care | Payer: Medicaid Other | Attending: Emergency Medicine | Admitting: Emergency Medicine

## 2020-04-13 DIAGNOSIS — M79671 Pain in right foot: Secondary | ICD-10-CM | POA: Diagnosis not present

## 2020-04-13 NOTE — ED Provider Notes (Signed)
[name]    CSN: [id number] Arrival date & time: 04/13/20  0943      History   Chief Complaint Chief Complaint  Patient presents with  . Foot Pain    HPI Gabrielle [name] is a 29 y.o. female presenting for persistent right foot pain with lateral numbness.  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  Patient previously seen in urgent care setting on 5/15: Given steroid for plantar fasciitis.  Went to the ER on 5/24: X-ray negative, physical exam reassuring.  Advised to take NSAIDs, follow-up with Ortho.  Patient states since ER visit pain has persisted.  Denies repeat traumatization.  Has tried wearing better shoes, though states that it hurts her foot.   History reviewed. No pertinent past medical history.  There are no problems to display for this patient.   Past Surgical History:  Procedure Laterality Date  . DENTAL SURGERY      OB History    Gravida  3   Para      Term      Preterm      AB      Living  1     SAB      TAB      Ectopic      Multiple      Live Births           Obstetric Comments  PT states [redacted] weeks gestation         Home Medications    Prior to Admission medications   Medication Sig Start Date End Date Taking? Authorizing Provider  ibuprofen (ADVIL) 600 MG tablet Take 1 tablet (600 mg total) by mouth every 6 (six) hours as needed. 10/26/19   Virgel Manifold, MD  naproxen (NAPROSYN) 500 MG tablet Take 1 tablet (500 mg total) by mouth 2 (two) times daily. 04/10/20   Lucrezia Starch, MD  Prenatal Vit-Fe Fumarate-FA (PRENATAL MULTIVITAMIN) TABS tablet Take 1 tablet by mouth daily at 12 noon.    [provider]    Family History Family History  Problem Relation Age of Onset  . Cancer Maternal Aunt        BREAST  . Cancer Paternal Grandmother        PROSTATE    Social History Social History   Tobacco Use  . Smoking status: Never Smoker    . Smokeless tobacco: Never Used  Substance Use Topics  . Alcohol use: No  . Drug use: No     Allergies   Patient has no known allergies.   Review of Systems As per HPI   Physical Exam Triage Vital Signs ED Triage Vitals  Enc Vitals Group     BP      Pulse      Resp      Temp      Temp src      SpO2      Weight      Height      Head Circumference      Peak Flow      Pain Score      Pain Loc      Pain Edu?      Excl. in [place]?    No data found.  Updated Vital Signs BP (!) 121/56   Pulse 80   Temp 98 F (36.7 C)   Resp 16  LMP 03/20/2020 (Approximate)   SpO2 98%   Visual Acuity Right Eye Distance:   Left Eye Distance:   Bilateral Distance:    Right Eye Near:   Left Eye Near:    Bilateral Near:     Physical Exam Constitutional:      General: She is not in acute distress. HENT:     Head: Normocephalic and atraumatic.  Eyes:     General: No scleral icterus.    Pupils: Pupils are equal, round, and reactive to light.  Cardiovascular:     Rate and Rhythm: Normal rate.  Pulmonary:     Effort: Pulmonary effort is normal.  Musculoskeletal:        General: Tenderness present. No swelling or deformity. Normal range of motion.     Right lower leg: No edema.     Left lower leg: No edema.  Skin:    Coloration: Skin is not jaundiced or pale.  Neurological:     Mental Status: She is alert and oriented to person, place, and time.      UC Treatments / Results  Labs (all labs ordered are listed, but only abnormal results are displayed) Labs Reviewed - No data to display  EKG   Radiology No results found.  Procedures Procedures (including critical care time)  Medications Ordered in UC Medications - No data to display  Initial Impression / Assessment and Plan / UC Course  I have reviewed the triage vital signs and the nursing notes.  Pertinent labs & imaging results that were available during my care of the patient were reviewed by me and  considered in my medical decision making (see chart for details).     Patient denies repeat traumatization since 5/24 ER visit: Repeat radiography deferred.  Exam unremarkable, though patient tearful second to pain.  Applied cam walker in office which patient tolerated well.  Stressed importance of follow-up with orthopedics for further evaluation/management.  Return precautions discussed, patient verbalized understanding and is agreeable to plan. Final Clinical Impressions(s) / UC Diagnoses   Final diagnoses:  Right foot pain     Discharge Instructions     Recommend RICE: rest, ice, compression, elevation as needed for pain.    Heat therapy (hot compress, warm wash rag, hot showers, etc.) can help relax muscles and soothe muscle aches. Cold therapy (ice packs) can be used to help swelling both after injury and after prolonged use of areas of chronic pain/aches.  For pain: Continue naproxen, may take Tylenol additionally if needed.  Very important that you follow-up with orthopedics for further evaluation.    ED Prescriptions    None     I have reviewed the PDMP during this encounter.   [name], Gabrielle, [place] 04/13/20 1013

## 2020-04-13 NOTE — ED Triage Notes (Signed)
Pt c/o right foot pain and numbness, states unable to tolerate work. Has not followed up with ortho. Naproxen prescribed in ER not helping.

## 2020-04-13 NOTE — Discharge Instructions (Addendum)
Recommend RICE: rest, ice, compression, elevation as needed for pain.    Heat therapy (hot compress, warm wash rag, hot showers, etc.) can help relax muscles and soothe muscle aches. Cold therapy (ice packs) can be used to help swelling both after injury and after prolonged use of areas of chronic pain/aches.  For pain: Continue naproxen, may take Tylenol additionally if needed.  Very important that you follow-up with orthopedics for further evaluation.

## 2020-05-12 ENCOUNTER — Telehealth: Payer: Self-pay

## 2020-05-12 NOTE — Telephone Encounter (Signed)

## 2020-05-15 ENCOUNTER — Ambulatory Visit: Payer: Medicaid Other | Admitting: Internal Medicine

## 2020-09-27 ENCOUNTER — Other Ambulatory Visit: Payer: Self-pay

## 2020-09-27 ENCOUNTER — Ambulatory Visit
Admission: EM | Admit: 2020-09-27 | Discharge: 2020-09-27 | Disposition: A | Discharge status: Home / Self Care | Payer: Medicaid Other | Attending: Family Medicine | Admitting: Family Medicine

## 2020-09-27 ENCOUNTER — Telehealth: Payer: Medicaid Other | Admitting: Family

## 2020-09-27 DIAGNOSIS — B349 Viral infection, unspecified: Secondary | ICD-10-CM

## 2020-09-27 DIAGNOSIS — Z20822 Contact with and (suspected) exposure to covid-19: Secondary | ICD-10-CM

## 2020-09-27 MED ORDER — CYCLOBENZAPRINE HCL 10 MG PO TABS: 10.0000 mg | ORAL_TABLET | Freq: Two times a day (BID) | ORAL | 0 refills | Status: DC | PRN

## 2020-09-27 MED ORDER — DICYCLOMINE HCL 20 MG PO TABS: 20.0000 mg | ORAL_TABLET | Freq: Two times a day (BID) | ORAL | 0 refills | Status: DC

## 2020-09-27 NOTE — ED Triage Notes (Signed)
Pt states she has had generalized body aches, a headache, and abdominal pain x 4-5 days. Pt also complains of loose stools and body soreness. Pt is aox4 and ambulatory.

## 2020-09-27 NOTE — ED Provider Notes (Signed)
[name]    CSN: [id number] Arrival date & time: 09/27/20  1056      History   Chief Complaint Chief Complaint  Patient presents with   Generalized Body Aches    x 4 days   Headache    x 4 days   Abdominal Pain    x 4 days    HPI Gabrielle [name] is a 29 y.o. female.   Patient presents with myalgias headache abdominal pain and diarrhea.  Cough is been minimal if any.  Denies sore throat.  Not aware of any fever although she does have some low-grade fever this morning.  No known exposure to similar illness but she does have a son who is in school.  HPI  History reviewed. No pertinent past medical history.  There are no problems to display for this patient.   Past Surgical History:  Procedure Laterality Date   DENTAL SURGERY      OB History    Gravida  3   Para      Term      Preterm      AB      Living  1     SAB      TAB      Ectopic      Multiple      Live Births           Obstetric Comments  PT states [redacted] weeks gestation         Home Medications    Prior to Admission medications   Medication Sig Start Date End Date Taking? Authorizing Provider  naproxen (NAPROSYN) 500 MG tablet Take 1 tablet (500 mg total) by mouth 2 (two) times daily. 04/10/20   Lucrezia Starch, MD  Prenatal Vit-Fe Fumarate-FA (PRENATAL MULTIVITAMIN) TABS tablet Take 1 tablet by mouth daily at 12 noon.    [provider]    Family History Family History  Problem Relation Age of Onset   Cancer Maternal Aunt        BREAST   Cancer Paternal Grandmother        PROSTATE    Social History Social History   Tobacco Use   Smoking status: Never Smoker   Smokeless tobacco: Never Used  Scientific laboratory technician Use: Never used  Substance Use Topics   Alcohol use: No   Drug use: No     Allergies   Patient has no known allergies.   Review of Systems Review of Systems  Constitutional: Positive for appetite change and fatigue.    Gastrointestinal: Positive for abdominal pain and diarrhea.  Musculoskeletal: Positive for myalgias.  All other systems reviewed and are negative.    Physical Exam Triage Vital Signs ED Triage Vitals  Enc Vitals Group     BP --      Pulse Rate 09/27/20 1220 74     Resp 09/27/20 1220 18     Temp 09/27/20 1220 98.3 F (36.8 C)     Temp Source 09/27/20 1220 Oral     SpO2 09/27/20 1220 99 %     Weight --      Height --      Head Circumference --      Peak Flow --      Pain Score 09/27/20 1222 9     Pain Loc --      Pain Edu? --      Excl. in [place]? --    No data found.  Updated  Vital Signs Pulse 74    Temp 98.3 F (36.8 C) (Oral)    Resp 18    LMP 09/24/2020 (Exact Date)    SpO2 99%   Visual Acuity Right Eye Distance:   Left Eye Distance:   Bilateral Distance:    Right Eye Near:   Left Eye Near:    Bilateral Near:     Physical Exam Vitals and nursing note reviewed.  Constitutional:      Appearance: She is well-developed. She is obese.  Cardiovascular:     Rate and Rhythm: Normal rate and regular rhythm.  Pulmonary:     Effort: Pulmonary effort is normal.     Breath sounds: Normal breath sounds.  Abdominal:     General: Bowel sounds are normal.     Palpations: Abdomen is soft.     Tenderness: There is no abdominal tenderness. There is no guarding.  Musculoskeletal:     Cervical back: Normal range of motion.  Neurological:     Mental Status: She is alert.  Psychiatric:        Mood and Affect: Mood normal.        Behavior: Behavior normal.      UC Treatments / Results  Labs (all labs ordered are listed, but only abnormal results are displayed) Labs Reviewed - No data to display  EKG   Radiology No results found.  Procedures Procedures (including critical care time)  Medications Ordered in UC Medications - No data to display  Initial Impression / Assessment and Plan / UC Course  I have reviewed the triage vital signs and the nursing  notes.  Pertinent labs & imaging results that were available during my care of the patient were reviewed by me and considered in my medical decision making (see chart for details).     Flulike illness.  Will treat symptomatically with medication for abdominal pains and myalgias and Tylenol for fever Final Clinical Impressions(s) / UC Diagnoses   Final diagnoses:  None   Discharge Instructions   None    ED Prescriptions    None     PDMP not reviewed this encounter.   Wardell Honour, MD 09/27/20 [phone]

## 2020-09-27 NOTE — Progress Notes (Signed)
E-Visit for Corona Virus Screening  Your current symptoms could be consistent with the coronavirus.  Many health care providers can now test patients at their office but not all are.  [place] has multiple testing sites. For information on our [place] testing locations and hours go to [email or web address]  We are enrolling you in our [place] for [place] . Daily you will receive a questionnaire within the [place] website. Our COVID 19 response team will be monitoring your responses daily.  Testing Information: The COVID-19 Community Testing sites are testing BY APPOINTMENT ONLY.  You can schedule online at [email or web address]  If you do not have access to a smart phone or computer you may call [phone] for an appointment.   Additional testing sites in the Community:  . For [facility] Testing sites in [facility]  FaceUpdate.[email or web address]  . For Pop-up testing sites in [place]  BowlDirectory.co.uk  . For [facility] [email or web address]  . For [facility] testing in [place] and [employer] [email or web address]  . For [facility] testing in [facility]   [email or web address]  For  more information about community testing call [phone]   Please quarantine yourself while awaiting your test results. Please stay home for a minimum of 10 days from the first day of illness with improving symptoms and you have had 24 hours of no fever (without the use of Tylenol (Acetaminophen) Motrin (Ibuprofen) or any fever reducing medication).  Also - Do not get tested prior to returning to work because once you have had a positive test the test can stay  positive for more than a month in some cases.   You should wear a mask or cloth face covering over your nose and mouth if you must be around other people or animals, including pets (even at home). Try to stay at least 6 feet away from other people. This will protect the people around you.  Please continue good preventive care measures, including:  frequent hand-washing, avoid touching your face, cover coughs/sneezes, stay out of crowds and keep a 6 foot distance from others.  COVID-19 is a respiratory illness with symptoms that are similar to the flu. Symptoms are typically mild to moderate, but there have been cases of severe illness and death due to the virus.   The following symptoms may appear 2-14 days after exposure: . Fever . Cough . Shortness of breath or difficulty breathing . Chills . Repeated shaking with chills . Muscle pain . Headache . Sore throat . New loss of taste or smell . Fatigue . Congestion or runny nose . Nausea or vomiting . Diarrhea  Go to the nearest hospital ED for assessment if fever/cough/breathlessness are severe or illness seems like a threat to life.  It is vitally important that if you feel that you have an infection such as this virus or any other virus that you stay home and away from places where you may spread it to others.  You should avoid contact with people age 55 and older.   You can use medication such as I have prescribed Naprosyn 500 mg. Take twice daily as needed for fever or body aches for 2 weeks  You may also take acetaminophen (Tylenol) as needed for fever.  Reduce your risk of any infection by using the same precautions used for avoiding the common cold or flu:  Marland Kitchen Wash your hands often with soap and warm water for at least 20 seconds.  If soap and water are not readily available,  use an alcohol-based hand sanitizer with at least 60% alcohol.  . If coughing or sneezing, cover your mouth and nose by coughing or sneezing into the elbow areas  of your shirt or coat, into a tissue or into your sleeve (not your hands). . Avoid shaking hands with others and consider head nods or verbal greetings only. . Avoid touching your eyes, nose, or mouth with unwashed hands.  . Avoid close contact with people who are sick. . Avoid places or events with large numbers of people in one location, like concerts or sporting events. . Carefully consider travel plans you have or are making. . If you are planning any travel outside or inside the [place], visit the CDC's Travelers' Health webpage for the latest health notices. . If you have some symptoms but not all symptoms, continue to monitor at home and seek medical attention if your symptoms worsen. . If you are having a medical emergency, call 911.  HOME CARE . Only take medications as instructed by your medical team. . Drink plenty of fluids and get plenty of rest. . A steam or ultrasonic humidifier can help if you have congestion.   GET HELP RIGHT AWAY IF YOU HAVE EMERGENCY WARNING SIGNS** FOR COVID-19. If you or someone is showing any of these signs seek emergency medical care immediately. Call 911 or proceed to your closest emergency facility if: . You develop worsening high fever. . Trouble breathing . Bluish lips or face . Persistent pain or pressure in the chest . New confusion . Inability to wake or stay awake . You cough up blood. . Your symptoms become more severe  **This list is not all possible symptoms. Contact your medical provider for any symptoms that are sever or concerning to you.  MAKE SURE YOU   Understand these instructions.  Will watch your condition.  Will get help right away if you are not doing well or get worse.  Your e-visit answers were reviewed by a board certified advanced clinical practitioner to complete your personal care plan.  Depending on the condition, your plan could have included both over the counter or prescription medications.  If there is a problem  please reply once you have received a response from your provider.  Your safety is important to [place].  If you have drug allergies check your prescription carefully.    You can use MyChart to ask questions about today's visit, request a non-urgent call back, or ask for a work or school excuse for 24 hours related to this e-Visit. If it has been greater than 24 hours you will need to follow up with your provider, or enter a new e-Visit to address those concerns. You will get an e-mail in the next two days asking about your experience.  I hope that your e-visit has been valuable and will speed your recovery. Thank you for using e-visits.   Approximately 5 minutes was spent documenting and reviewing patient's chart.

## 2021-01-15 ENCOUNTER — Encounter: Payer: Self-pay | Admitting: *Deleted

## 2021-01-15 ENCOUNTER — Ambulatory Visit
Admission: EM | Admit: 2021-01-15 | Discharge: 2021-01-15 | Disposition: A | Discharge status: Home / Self Care | Payer: Medicaid Other | Attending: Emergency Medicine | Admitting: Emergency Medicine

## 2021-01-15 ENCOUNTER — Other Ambulatory Visit: Payer: Self-pay

## 2021-01-15 DIAGNOSIS — Z1152 Encounter for screening for COVID-19: Secondary | ICD-10-CM | POA: Diagnosis not present

## 2021-01-15 DIAGNOSIS — J069 Acute upper respiratory infection, unspecified: Secondary | ICD-10-CM

## 2021-01-15 DIAGNOSIS — G4700 Insomnia, unspecified: Secondary | ICD-10-CM | POA: Insufficient documentation

## 2021-01-15 DIAGNOSIS — Z20822 Contact with and (suspected) exposure to covid-19: Secondary | ICD-10-CM | POA: Insufficient documentation

## 2021-01-15 DIAGNOSIS — N898 Other specified noninflammatory disorders of vagina: Secondary | ICD-10-CM | POA: Diagnosis not present

## 2021-01-15 DIAGNOSIS — Z113 Encounter for screening for infections with a predominantly sexual mode of transmission: Secondary | ICD-10-CM | POA: Insufficient documentation

## 2021-01-15 LAB — POCT URINE PREGNANCY: Preg Test, Ur: NEGATIVE

## 2021-01-15 LAB — POCT RAPID STREP A (OFFICE): Rapid Strep A Screen: NEGATIVE

## 2021-01-15 MED ORDER — IBUPROFEN 800 MG PO TABS: 800.0000 mg | ORAL_TABLET | Freq: Three times a day (TID) | ORAL | 0 refills | Status: DC

## 2021-01-15 MED ORDER — BENZONATATE 200 MG PO CAPS: 200.0000 mg | ORAL_CAPSULE | Freq: Three times a day (TID) | ORAL | 0 refills | Status: AC | PRN

## 2021-01-15 MED ORDER — FLUTICASONE PROPIONATE 50 MCG/ACT NA SUSP: 1.0000 | Freq: Every day | NASAL | 0 refills | Status: DC

## 2021-01-15 MED ORDER — CETIRIZINE HCL 10 MG PO CAPS: 10.0000 mg | ORAL_CAPSULE | Freq: Every day | ORAL | 0 refills | Status: DC

## 2021-01-15 MED ORDER — HYDROXYZINE HCL 25 MG PO TABS: 25.0000 mg | ORAL_TABLET | Freq: Every evening | ORAL | 0 refills | Status: DC | PRN

## 2021-01-15 NOTE — ED Triage Notes (Signed)
Pt reports starting 2 days ago developing a sore throat, cough and HA. Pt also has a vag discharge.

## 2021-01-15 NOTE — ED Provider Notes (Signed)
[name]    CSN: [id number] Arrival date & time: 01/15/21  0806      History   Chief Complaint Chief Complaint  Patient presents with  . Sore Throat  . Cough  . Headache  . Vaginal Discharge    HPI Gabrielle [name] is a 30 y.o. female presenting today for evaluation of URI symptoms.  Reports associated cough, headache and sore throat.  Symptoms began 2 days ago.  Reports a lot of frontal headache/sinus pressure congestion drainage and cough.  Denies fevers.  Denies known sick contacts.    Also reports associated discharge.  Unsure as this discharge is normal for her.  Denies any significant itching irritation or burning associated with it.  Also reports poor sleep over the past 2 weeks.  Reports very fragmented and often will only have approximately 1 hour sustained sleep.  Feels very drowsy during the day and fatigued.  Denies any specific thoughts keeping her up, but does report she is in school and does have stress.  HPI  History reviewed. No pertinent past medical history.  There are no problems to display for this patient.   Past Surgical History:  Procedure Laterality Date  . DENTAL SURGERY      OB History    Gravida  3   Para      Term      Preterm      AB      Living  1     SAB      IAB      Ectopic      Multiple      Live Births           Obstetric Comments  PT states [redacted] weeks gestation         Home Medications    Prior to Admission medications   Medication Sig Start Date End Date Taking? Authorizing Provider  benzonatate (TESSALON) 200 MG capsule Take 1 capsule (200 mg total) by mouth 3 (three) times daily as needed for up to 7 days for cough. 01/15/21 01/22/21 Yes Rajah Tagliaferro C, PA-C  Cetirizine HCl 10 MG CAPS Take 1 capsule (10 mg total) by mouth daily for 10 days. 01/15/21 01/25/21 Yes Rhianna Raulerson C, PA-C  fluticasone (FLONASE) 50 MCG/ACT nasal spray Place 1-2 sprays into both nostrils daily. 01/15/21  Yes Gracianna Vink,  Phelan Schadt C, PA-C  hydrOXYzine (ATARAX/VISTARIL) 25 MG tablet Take 1 tablet (25 mg total) by mouth at bedtime as needed for anxiety (insomnia). 01/15/21  Yes Coulton Schlink C, PA-C  ibuprofen (ADVIL) 800 MG tablet Take 1 tablet (800 mg total) by mouth 3 (three) times daily. 01/15/21  Yes Antonya Leeder C, PA-C  cyclobenzaprine (FLEXERIL) 10 MG tablet Take 1 tablet (10 mg total) by mouth 2 (two) times daily as needed for muscle spasms. Take as needed for aching 09/27/20   Wardell Honour, MD  dicyclomine (BENTYL) 20 MG tablet Take 1 tablet (20 mg total) by mouth 2 (two) times daily. 09/27/20   Wardell Honour, MD  naproxen (NAPROSYN) 500 MG tablet Take 1 tablet (500 mg total) by mouth 2 (two) times daily. 04/10/20   Lucrezia Starch, MD  Prenatal Vit-Fe Fumarate-FA (PRENATAL MULTIVITAMIN) TABS tablet Take 1 tablet by mouth daily at 12 noon.    [provider]    Family History Family History  Problem Relation Age of Onset  . Cancer Maternal Aunt        BREAST  . Cancer Paternal Grandmother  PROSTATE    Social History Social History   Tobacco Use  . Smoking status: Never Smoker  . Smokeless tobacco: Never Used  Vaping Use  . Vaping Use: Never used  Substance Use Topics  . Alcohol use: No  . Drug use: No     Allergies   Patient has no known allergies.   Review of Systems Review of Systems  Constitutional: Negative for activity change, appetite change, chills, fatigue and fever.  HENT: Positive for congestion, rhinorrhea and sore throat. Negative for ear pain, sinus pressure and trouble swallowing.   Eyes: Negative for discharge and redness.  Respiratory: Positive for cough. Negative for chest tightness and shortness of breath.   Cardiovascular: Negative for chest pain.  Gastrointestinal: Negative for abdominal pain, diarrhea, nausea and vomiting.  Genitourinary: Positive for vaginal discharge.  Musculoskeletal: Negative for myalgias.  Skin: Negative for  rash.  Neurological: Negative for dizziness, light-headedness and headaches.  Psychiatric/Behavioral: Positive for sleep disturbance.     Physical Exam Triage Vital Signs ED Triage Vitals [01/15/21 0815]  Enc Vitals Group     BP      Pulse      Resp      Temp      Temp src      SpO2      Weight      Height      Head Circumference      Peak Flow      Pain Score 8     Pain Loc      Pain Edu?      Excl. in [place]?    No data found.  Updated Vital Signs BP 106/72 (BP Location: Left Arm)   Pulse 69   Temp 98.5 F (36.9 C) (Oral)   Resp 16   LMP 01/01/2021   SpO2 98%   Visual Acuity Right Eye Distance:   Left Eye Distance:   Bilateral Distance:    Right Eye Near:   Left Eye Near:    Bilateral Near:     Physical Exam Vitals and nursing note reviewed.  Constitutional:      Appearance: She is well-developed and well-nourished.     Comments: No acute distress  HENT:     Head: Normocephalic and atraumatic.     Ears:     Comments: Bilateral ears without tenderness to palpation of external auricle, tragus and mastoid, EAC's without erythema or swelling, TM's with good bony landmarks and cone of light. Non erythematous.     Nose: Nose normal.     Mouth/Throat:     Comments: Oral mucosa pink and moist, no tonsillar enlargement or exudate. Posterior pharynx patent and nonerythematous, no uvula deviation or swelling. Normal phonation.  Eyes:     Conjunctiva/sclera: Conjunctivae normal.  Cardiovascular:     Rate and Rhythm: Normal rate.  Pulmonary:     Effort: Pulmonary effort is normal. No respiratory distress.     Comments: Breathing comfortably at rest, CTABL, no wheezing, rales or other adventitious sounds auscultated Abdominal:     General: There is no distension.  Musculoskeletal:        General: Normal range of motion.     Cervical back: Neck supple.  Skin:    General: Skin is warm and dry.  Neurological:     Mental Status: She is alert and oriented to  person, place, and time.  Psychiatric:        Mood and Affect: Mood and affect normal.  UC Treatments / Results  Labs (all labs ordered are listed, but only abnormal results are displayed) Labs Reviewed  NOVEL CORONAVIRUS, NAA  CULTURE, GROUP A STREP [facility])  POCT URINE PREGNANCY  POCT RAPID STREP A (OFFICE)  CERVICOVAGINAL ANCILLARY ONLY    EKG   Radiology No results found.  Procedures Procedures (including critical care time)  Medications Ordered in UC Medications - No data to display  Initial Impression / Assessment and Plan / UC Course  I have reviewed the triage vital signs and the nursing notes.  Pertinent labs & imaging results that were available during my care of the patient were reviewed by me and considered in my medical decision making (see chart for details).     1.  Viral URI with cough-strep negative, COVID pending.  2 days of symptoms, exam reassuring.  Recommend symptomatic and supportive care with close monitoring  2.  Vaginal discharge-swab pending, pregnancy negative, deferring empiric treatment, will call with results if needed  3.  Insomnia-we will try hydroxyzine, providing information on sleep hygiene  Discussed strict return precautions. Patient verbalized understanding and is agreeable with plan.  Final Clinical Impressions(s) / UC Diagnoses   Final diagnoses:  Encounter for screening for COVID-19  Viral URI with cough  Vaginal discharge  Insomnia, unspecified type     Discharge Instructions     Strep test negative, Covid test pending, monitor MyChart for results Tylenol and ibuprofen for headache, sore throat Flonase and daily cetirizine help with congestion and drainage May use Tessalon for cough or may use other over-the-counter medicines if preferred May try hydroxyzine at bedtime to help with sleep Vaginal swab pending-we will call if abnormal and provide further treatment if needed  Please follow-up if any symptoms not  improving or worsening    ED Prescriptions    Medication Sig Dispense Auth. Provider   fluticasone (FLONASE) 50 MCG/ACT nasal spray Place 1-2 sprays into both nostrils daily. 16 g Tasfia Vasseur C, PA-C   ibuprofen (ADVIL) 800 MG tablet Take 1 tablet (800 mg total) by mouth 3 (three) times daily. 21 tablet Ventura Leggitt C, PA-C   Cetirizine HCl 10 MG CAPS Take 1 capsule (10 mg total) by mouth daily for 10 days. 10 capsule Miata Culbreth C, PA-C   benzonatate (TESSALON) 200 MG capsule Take 1 capsule (200 mg total) by mouth 3 (three) times daily as needed for up to 7 days for cough. 28 capsule Carlson Belland C, PA-C   hydrOXYzine (ATARAX/VISTARIL) 25 MG tablet Take 1 tablet (25 mg total) by mouth at bedtime as needed for anxiety (insomnia). 12 tablet Aayansh Codispoti, [place] C, PA-C     PDMP not reviewed this encounter.   Avi Archuleta, [place] C, PA-C 01/15/21 0900

## 2021-01-15 NOTE — Discharge Instructions (Signed)
Strep test negative, Covid test pending, monitor MyChart for results Tylenol and ibuprofen for headache, sore throat Flonase and daily cetirizine help with congestion and drainage May use Tessalon for cough or may use other over-the-counter medicines if preferred May try hydroxyzine at bedtime to help with sleep Vaginal swab pending-we will call if abnormal and provide further treatment if needed  Please follow-up if any symptoms not improving or worsening

## 2021-01-16 LAB — CERVICOVAGINAL ANCILLARY ONLY
Bacterial Vaginitis (gardnerella): POSITIVE — AB
Candida Glabrata: NEGATIVE
Candida Vaginitis: NEGATIVE
Chlamydia: NEGATIVE
Comment: NEGATIVE
Comment: NEGATIVE
Comment: NEGATIVE
Comment: NEGATIVE
Comment: NEGATIVE
Comment: NORMAL
Neisseria Gonorrhea: NEGATIVE
Trichomonas: NEGATIVE

## 2021-01-17 ENCOUNTER — Telehealth (HOSPITAL_COMMUNITY): Payer: Self-pay | Admitting: Emergency Medicine

## 2021-01-17 LAB — CULTURE, GROUP A STREP (THRC)

## 2021-01-17 LAB — SARS-COV-2, NAA 2 DAY TAT

## 2021-01-17 LAB — NOVEL CORONAVIRUS, NAA: SARS-CoV-2, NAA: NOT DETECTED

## 2021-01-17 MED ORDER — METRONIDAZOLE 500 MG PO TABS: 500.0000 mg | ORAL_TABLET | Freq: Two times a day (BID) | ORAL | 0 refills | Status: DC

## 2022-02-14 ENCOUNTER — Other Ambulatory Visit: Payer: Self-pay | Admitting: Physician Assistant

## 2022-02-14 DIAGNOSIS — N6325 Unspecified lump in the left breast, overlapping quadrants: Secondary | ICD-10-CM

## 2022-03-08 ENCOUNTER — Other Ambulatory Visit: Payer: Self-pay | Admitting: Physician Assistant

## 2022-03-08 ENCOUNTER — Ambulatory Visit
Admission: RE | Admit: 2022-03-08 | Discharge: 2022-03-08 | Disposition: A | Discharge status: Home / Self Care | Payer: Medicaid Other | Source: Ambulatory Visit | Attending: Physician Assistant | Admitting: Physician Assistant

## 2022-03-08 DIAGNOSIS — N6325 Unspecified lump in the left breast, overlapping quadrants: Secondary | ICD-10-CM

## 2022-03-11 ENCOUNTER — Ambulatory Visit
Admission: RE | Admit: 2022-03-11 | Discharge: 2022-03-11 | Disposition: A | Discharge status: Home / Self Care | Payer: Medicaid Other | Source: Ambulatory Visit | Attending: Physician Assistant | Admitting: Physician Assistant

## 2022-03-11 DIAGNOSIS — C801 Malignant (primary) neoplasm, unspecified: Secondary | ICD-10-CM

## 2022-03-11 DIAGNOSIS — N6325 Unspecified lump in the left breast, overlapping quadrants: Secondary | ICD-10-CM

## 2022-03-11 HISTORY — PX: BREAST BIOPSY: SHX20

## 2022-03-11 HISTORY — DX: Malignant (primary) neoplasm, unspecified: C80.1

## 2022-03-13 ENCOUNTER — Telehealth: Payer: Self-pay | Admitting: Hematology

## 2022-03-13 NOTE — Telephone Encounter (Signed)
Spoke to patient to confirm morning clinic appointment for 5/3, packet mailed to patient ?

## 2022-03-15 ENCOUNTER — Encounter: Payer: Self-pay | Admitting: *Deleted

## 2022-03-15 DIAGNOSIS — C50412 Malignant neoplasm of upper-outer quadrant of left female breast: Secondary | ICD-10-CM

## 2022-03-15 DIAGNOSIS — C50912 Malignant neoplasm of unspecified site of left female breast: Secondary | ICD-10-CM | POA: Insufficient documentation

## 2022-03-19 NOTE — Progress Notes (Signed)
?Radiation Oncology         [phone] ?________________________________ ? ?Name: Gabrielle [name]        MRN: [id number]  ?Date of Service: 03/20/2022 DOB: [birth year] ? ?OB:[id number], [facility]  Rolm Bookbinder, MD    ? ?REFERRING PHYSICIAN: Rolm Bookbinder, MD ? ? ?DIAGNOSIS: The encounter diagnosis was Malignant neoplasm of upper-outer quadrant of left breast in female, estrogen receptor negative ([place]). ? ? ?HISTORY OF PRESENT ILLNESS: Gabrielle [name] is a 31 y.o. female seen in the multidisciplinary breast clinic for a new diagnosis of left breast cancer. The patient was noted to have a palpable mass in the left breast.  Further diagnostic work-up showed a mass in the 12 o'clock position measuring up to 3.7 cm.  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  She is seen to discuss treatment recommendations of her cancer.. ? ? ?PREVIOUS RADIATION THERAPY: No ? ? ?PAST MEDICAL HISTORY: No past medical history on file.   ? ? ?PAST SURGICAL HISTORY: ?Past Surgical History:  ?Procedure Laterality Date  ? BREAST BIOPSY Left 03/11/2022  ? DENTAL SURGERY    ? ? ? ?FAMILY HISTORY:  ?Family History  ?Problem Relation Age of Onset  ? Breast cancer Mother 15  ? Breast cancer Paternal Grandmother   ? Cancer Paternal Grandmother   ? Breast cancer Maternal Aunt 77  ? Cancer Maternal Aunt   ?     BREAST  ? Breast cancer Paternal Aunt 17  ? ? ? ?SOCIAL HISTORY:  reports that she has never smoked. She has never used smokeless tobacco. She reports that she does not drink alcohol and does not use drugs. The patient is single and lives in [place]. She is unemployed and living with her sister. She has a 45 year old son whom she has custody of, and her daughter who is 86 lives with her father.  ? ? ?ALLERGIES: Patient has no known allergies. ? ? ?MEDICATIONS:  ?Current Outpatient Medications  ?Medication Sig Dispense Refill  ? Cetirizine  HCl 10 MG CAPS Take 1 capsule (10 mg total) by mouth daily for 10 days. 10 capsule 0  ? cyclobenzaprine (FLEXERIL) 10 MG tablet Take 1 tablet (10 mg total) by mouth 2 (two) times daily as needed for muscle spasms. Take as needed for aching 20 tablet 0  ? dicyclomine (BENTYL) 20 MG tablet Take 1 tablet (20 mg total) by mouth 2 (two) times daily. 20 tablet 0  ? fluticasone (FLONASE) 50 MCG/ACT nasal spray Place 1-2 sprays into both nostrils daily. 16 g 0  ? hydrOXYzine (ATARAX/VISTARIL) 25 MG tablet Take 1 tablet (25 mg total) by mouth at bedtime as needed for anxiety (insomnia). 12 tablet 0  ? ibuprofen (ADVIL) 800 MG tablet Take 1 tablet (800 mg total) by mouth 3 (three) times daily. 21 tablet 0  ? metroNIDAZOLE (FLAGYL) 500 MG tablet Take 1 tablet (500 mg total) by mouth 2 (two) times daily. 14 tablet 0  ? naproxen (NAPROSYN) 500 MG tablet Take 1 tablet (500 mg total) by mouth 2 (two) times daily. 20 tablet 0  ? Prenatal Vit-Fe Fumarate-FA (PRENATAL MULTIVITAMIN) TABS tablet Take 1 tablet by mouth daily at 12 noon.    ? ?No current facility-administered medications for this visit.  ? ? ? ?REVIEW OF SYSTEMS: On review of systems, the patient reports that she is doing okay overall but has feel inundated with information  about her diagnosis but feels like she's starting to digest the information a bit more by this part of the day since coming in for clinic. No other complaints are noted. ? ?  ? ?PHYSICAL EXAM:  ?Wt Readings from Last 3 Encounters:  ?04/10/20 215 lb (97.5 kg)  ?07/12/16 250 lb (113.4 kg)  ?09/28/15 250 lb (113.4 kg)  ? ?Temp Readings from Last 3 Encounters:  ?01/15/21 98.5 ?F (36.9 ?C) (Oral)  ?09/27/20 98.3 ?F (36.8 ?C) (Oral)  ?04/13/20 98 ?F (36.7 ?C)  ? ?BP Readings from Last 3 Encounters:  ?01/15/21 106/72  ?04/13/20 (!) 121/56  ?04/10/20 104/70  ? ?Pulse Readings from Last 3 Encounters:  ?01/15/21 69  ?09/27/20 74  ?04/13/20 80  ? ? ?In general this is a well appearing caucasian female in no  acute distress. She's alert and oriented x4 and appropriate throughout the examination. Cardiopulmonary assessment is negative for acute distress and she exhibits normal effort. Bilateral breast exam is deferred. ? ? ? ?ECOG = 1 ? ?0 - Asymptomatic (Fully active, able to carry on all predisease activities without restriction) ? ?1 - Symptomatic but completely ambulatory (Restricted in physically strenuous activity but ambulatory and able to carry out work of a light or sedentary nature. For example, light housework, office work) ? ?2 - Symptomatic, <50% in bed during the day (Ambulatory and capable of all self care but unable to carry out any work activities. Up and about more than 50% of waking hours) ? ?3 - Symptomatic, >50% in bed, but not bedbound (Capable of only limited self-care, confined to bed or chair 50% or more of waking hours) ? ?4 - Bedbound (Completely disabled. Cannot carry on any self-care. Totally confined to bed or chair) ? ?5 - Death ? ? Oken MM, Creech RH, Tormey DC, et al. [phone]). "Toxicity and response criteria of the [facility]". [place] Oncol. 5 (6): 649-55 ? ? ? ?LABORATORY DATA:  ?No results found for: WBC, HGB, HCT, MCV, PLT ?No results found for: NA, K, CL, CO2 ?No results found for: ALT, AST, GGT, ALKPHOS, BILITOT ?  ? ?RADIOGRAPHY: US BREAST LTD UNI LEFT INC AXILLA ? ?Result Date: 03/08/2022 ?CLINICAL DATA:  31 year old female presenting for evaluation of a palpable lump in the left breast which she feels has increased in size since she first identified it. Her mother was diagnosed with breast cancer within the last 6 months. She also has family history of breast cancer in multiple aunts and her maternal grandmother. EXAM: DIGITAL DIAGNOSTIC BILATERAL MAMMOGRAM WITH TOMOSYNTHESIS AND CAD; ULTRASOUND LEFT BREAST LIMITED TECHNIQUE: Bilateral digital diagnostic mammography and breast tomosynthesis was performed. The images were evaluated with computer-aided  detection.; Targeted ultrasound examination of the left breast was performed. COMPARISON:  None. ACR Breast Density Category c: The breast tissue is heterogeneously dense, which may obscure small masses. FINDINGS: A BB indicating the palpable site of concern has been placed along the superior aspect of the left breast. Deep to this marker there is a lobulated irregular mass with indistinct margins measuring 3.6 cm. There are associated amorphous calcifications within the mass. No suspicious calcifications, masses or areas of distortion are seen in the bilateral breasts. Physical exam of the left breast demonstrates a firm palpable lump at the 12 o'clock position. Ultrasound of the left breast at 12 o'clock, 9 cm from the nipple demonstrates a heterogeneous oval mass with indistinct margins measuring 3.7 x 2.3 x 3.4 cm. Ultrasound of the left axilla demonstrates  normal-appearing lymph nodes. IMPRESSION: 1. There is a suspicious 3.7 cm mass in the left breast at 12 o'clock. 2.  No evidence of left axillary lymphadenopathy. 3.  No evidence of malignancy in the right breast. RECOMMENDATION: 1. Ultrasound-guided biopsy is recommended for the palpable left breast mass at 12 o'clock. The procedure has been scheduled for 03/11/2022 at 2:45 p.m. 2. The patient has very strong family history of breast cancer in her mother, multiple aunts and her maternal grandmother. She has high risk for breast cancer. Genetics consultation and breast MRI is recommended following biopsy. I have discussed the findings and recommendations with the patient. If applicable, a reminder letter will be sent to the patient regarding the next appointment. BI-RADS CATEGORY  4: Suspicious. Electronically Signed   By: Ammie Ferrier M.D.   On: 03/08/2022 09:07 ? ?MM DIAG BREAST TOMO BILATERAL ? ?Result Date: 03/08/2022 ?CLINICAL DATA:  31 year old female presenting for evaluation of a palpable lump in the left breast which she feels has increased in  size since she first identified it. Her mother was diagnosed with breast cancer within the last 6 months. She also has family history of breast cancer in multiple aunts and her maternal grandmother. EXAM: DIG

## 2022-03-20 ENCOUNTER — Inpatient Hospital Stay (HOSPITAL_BASED_OUTPATIENT_CLINIC_OR_DEPARTMENT_OTHER): Payer: Medicaid Other | Admitting: Genetic Counselor

## 2022-03-20 ENCOUNTER — Inpatient Hospital Stay: Payer: Medicaid Other

## 2022-03-20 ENCOUNTER — Encounter: Payer: Self-pay | Admitting: *Deleted

## 2022-03-20 ENCOUNTER — Inpatient Hospital Stay: Payer: Medicaid Other | Attending: Hematology | Admitting: Hematology

## 2022-03-20 ENCOUNTER — Other Ambulatory Visit: Payer: Self-pay

## 2022-03-20 ENCOUNTER — Other Ambulatory Visit: Payer: Self-pay | Admitting: *Deleted

## 2022-03-20 ENCOUNTER — Encounter: Payer: Self-pay | Admitting: Hematology

## 2022-03-20 ENCOUNTER — Encounter: Payer: Self-pay | Admitting: Radiology

## 2022-03-20 ENCOUNTER — Ambulatory Visit
Admission: RE | Admit: 2022-03-20 | Discharge: 2022-03-20 | Disposition: A | Discharge status: Home / Self Care | Payer: Medicaid Other | Source: Ambulatory Visit | Attending: Radiation Oncology | Admitting: Radiation Oncology

## 2022-03-20 ENCOUNTER — Inpatient Hospital Stay: Payer: Medicaid Other | Admitting: Licensed Clinical Social Worker

## 2022-03-20 VITALS — BP 102/63 | HR 71 | Temp 97.5°F | Resp 18 | Ht 69.0 in | Wt 275.7 lb

## 2022-03-20 DIAGNOSIS — Z8042 Family history of malignant neoplasm of prostate: Secondary | ICD-10-CM | POA: Diagnosis not present

## 2022-03-20 DIAGNOSIS — Z171 Estrogen receptor negative status [ER-]: Secondary | ICD-10-CM | POA: Diagnosis not present

## 2022-03-20 DIAGNOSIS — F17210 Nicotine dependence, cigarettes, uncomplicated: Secondary | ICD-10-CM | POA: Diagnosis not present

## 2022-03-20 DIAGNOSIS — F419 Anxiety disorder, unspecified: Secondary | ICD-10-CM | POA: Insufficient documentation

## 2022-03-20 DIAGNOSIS — E7800 Pure hypercholesterolemia, unspecified: Secondary | ICD-10-CM | POA: Insufficient documentation

## 2022-03-20 DIAGNOSIS — G4700 Insomnia, unspecified: Secondary | ICD-10-CM | POA: Diagnosis not present

## 2022-03-20 DIAGNOSIS — Z803 Family history of malignant neoplasm of breast: Secondary | ICD-10-CM

## 2022-03-20 DIAGNOSIS — C50412 Malignant neoplasm of upper-outer quadrant of left female breast: Secondary | ICD-10-CM | POA: Diagnosis present

## 2022-03-20 LAB — CMP (CANCER CENTER ONLY)
ALT: 14 U/L (ref 0–44)
AST: 15 U/L (ref 15–41)
Albumin: 4 g/dL (ref 3.5–5.0)
Alkaline Phosphatase: 64 U/L (ref 38–126)
Anion gap: 7 (ref 5–15)
BUN: 18 mg/dL (ref 6–20)
CO2: 26 mmol/L (ref 22–32)
Calcium: 9.3 mg/dL (ref 8.9–10.3)
Chloride: 104 mmol/L (ref 98–111)
Creatinine: 0.95 mg/dL (ref 0.44–1.00)
GFR, Estimated: >60 mL/min (ref >60)
Glucose, Bld: 91 mg/dL (ref 70–99)
Potassium: 3.3 mmol/L — ABNORMAL LOW (ref 3.5–5.1)
Sodium: 137 mmol/L (ref 135–145)
Total Bilirubin: 0.6 mg/dL (ref 0.3–1.2)
Total Protein: 7.2 g/dL (ref 6.5–8.1)

## 2022-03-20 LAB — CBC WITH DIFFERENTIAL (CANCER CENTER ONLY)
Abs Immature Granulocytes: 0.04 10*3/uL (ref 0.00–0.07)
Basophils Absolute: 0 10*3/uL (ref 0.0–0.1)
Basophils Relative: 0 %
Eosinophils Absolute: 0.3 10*3/uL (ref 0.0–0.5)
Eosinophils Relative: 4 %
HCT: 39.3 % (ref 36.0–46.0)
Hemoglobin: 12.7 g/dL (ref 12.0–15.0)
Immature Granulocytes: 1 %
Lymphocytes Relative: 33 %
Lymphs Abs: 2.7 10*3/uL (ref 0.7–4.0)
MCH: 26.5 pg (ref 26.0–34.0)
MCHC: 32.3 g/dL (ref 30.0–36.0)
MCV: 82 fL (ref 80.0–100.0)
Monocytes Absolute: 0.6 10*3/uL (ref 0.1–1.0)
Monocytes Relative: 8 %
Neutro Abs: 4.6 10*3/uL (ref 1.7–7.7)
Neutrophils Relative %: 54 %
Platelet Count: 306 10*3/uL (ref 150–400)
RBC: 4.79 MIL/uL (ref 3.87–5.11)
RDW: 13.5 % (ref 11.5–15.5)
WBC Count: 8.3 10*3/uL (ref 4.0–10.5)
nRBC: 0 % (ref 0.0–0.2)

## 2022-03-20 LAB — GENETIC SCREENING ORDER

## 2022-03-20 MED ORDER — ZOLPIDEM TARTRATE 5 MG PO TABS: 5.0000 mg | ORAL_TABLET | Freq: Every evening | ORAL | 0 refills | Status: DC | PRN

## 2022-03-20 NOTE — Research (Signed)
[employer] 2021-05 - Specimen Collection Study to Evaluate Biomarkers in Subjects with Cancer   ? ?03/20/2022 ? ?CONSENT INTRO:  ?Patient Gabrielle [name] was identified by Dr. Burr Medico as a potential candidate for the above listed study.  This Clinical Research Coordinator met with Gabrielle [name], [id number], on 03/20/22 in a manner and location that ensures patient privacy to discuss participation in the above listed research study.  Patient is Unaccompanied.  A copy of the informed consent document and embedded HIPAA Authorization was provided to the patient.  Patient reads, speaks, and understands [place].   ?Patient was provided with the business card of this Coordinator and encouraged to contact the research team with any questions.  Approximately 10 minutes were spent with the patient reviewing the informed consent documents.  Patient was provided the option of taking informed consent documents home to review and was encouraged to review at their convenience with their support network, including other care providers. Patient took the consent documents home to review. Informed patient of voluntary nature of this study.  Patient did not have any questions at this time. This coordinator will follow-up with patient later this week or early next week to discuss any questions/concerns or potential interest.   ? ?Carol Ada, RT(R)(T) ?Clinical Research Coordinator  ?

## 2022-03-20 NOTE — Progress Notes (Signed)
[facility] Social [facility]  ?Initial Assessment ? ? ?Gabrielle [name] is a 31 y.o. year old female presenting alone. Clinical Social Work was referred by  [facility]  for assessment of psychosocial needs.  ? ?SDOH (Social Determinants of Health) assessments performed: Yes ?SDOH Interventions   ? ?Flowsheet Row Most Recent Value  ?SDOH Interventions   ?Food Insecurity Interventions Other (Comment)  [food pantry, pt already has SNAP]  ?Financial Strain Interventions Other (Comment)  [cancer foundations]  ?Housing Interventions Other (Comment)  [pt staying with sister until she can work again]  ?Transportation Interventions Payor Benefit  ? ?  ?  ?Distress Screen completed: Yes ? ?  03/20/2022  ?  3:22 PM  ?ONCBCN DISTRESS SCREENING  ?Screening Type Initial Screening  ?Distress experienced in past week (1-10) 9  ?Practical problem type Housing;Transportation;Food  ?Emotional problem type Nervousness/Anxiety  ?Physical Problem type Sleep/insomnia;Loss of appetitie  ? ? ? ? ?Family/Social Information:  ?Housing Arrangement: patient lives with her 48 yo, sister, sister's girlfriend, sister's two children (recently moved in with sister due to issues with housing in [place]). Pt's older child (58yo) primarily lives in [place] with his dad ?Family members/support persons in your life? Family- mom ,sister ?Transportation concerns: yes, cost of gas. Does have Medicaid transportation benefit  ?Employment: Unemployed (previously worked as a Land). Income source: Supported by Family and Friends and No income ?Financial concerns: Yes, current concerns ?Type of concern: Transportation, Food, and housing (wants to find her own place once she has income again) ?Food access concerns: yes, SNAP benefits are only $23/month currently. Working with DSS to see if they can be increased ?Religious or spiritual practice: unsure ?Services Currently in place:  Healthy blue Medicaid, SNAP ? ?Coping/ Adjustment to diagnosis: ?Patient  understands treatment plan and what happens next? Yes. She is adjusting to the information. She has good support from her mom who was also treated at [facility] for breast cancer as well as the sister she is living with ?Concerns about diagnosis and/or treatment: Overwhelmed by information ?Patient reported stressors: Housing, Publishing rights manager, [employer], and Food ?Hopes and priorities: Be able to be treated and then find a job so she can find housing for her and her son ?Patient enjoys  not addressed ?Current coping skills/ strengths: Motivation for treatment/growth  and Supportive family/friends  ? ? ? SUMMARY: ?Current SDOH Barriers:  ?Financial constraints related to loss of employment causing stress with transportation costs, food, and other bills ? ?Clinical Social Work Clinical Goal(s):  ?explore Data processing manager options for unmet needs related to:  Sales promotion account executive  and Food Insecurity  ? ?Interventions: ?Discussed common feeling and emotions when being diagnosed with cancer, and the importance of support during treatment ?Informed patient of the support team roles and support services at [facility] ?Provided CSW contact information and encouraged patient to call with any questions or concerns ?Completed and submitted application for Lacy Duverney ?Provided information about how to contact transportation benefit through insurance and how to ask about gas reimbursement ?Provided information about on-site food pantry ? ? ?Follow Up Plan: Patient will contact insurance for transportation benefit. Referral made to CSW Manuela Schwartz to follow-up with pt on additional resources ?Patient verbalizes understanding of plan: Yes ? ? ? ?Rheta Hemmelgarn E Orpheus [place], LCSW ?

## 2022-03-20 NOTE — Progress Notes (Signed)
?Gabrielle [name]   ?[phone]) [phone] [phone]   ?Clinic New Consult Note  ? ?Patient Care Team: ?Trey Sailors, PA as PCP - General (Physician Assistant) ?Rockwell Germany, RN as Oncology Nurse Navigator ?Mauro Kaufmann, RN as Oncology Nurse Navigator ?Rolm Bookbinder, MD as Consulting Physician (General Surgery) ?Truitt Merle, MD as Consulting Physician (Hematology) ?Kyung Rudd, MD as Consulting Physician (Radiation Oncology) ?Latanya Maudlin, MD as Consulting Physician (Orthopedic Surgery) ? ?Date of Service:  03/20/2022  ? ?CHIEF COMPLAINTS/PURPOSE OF CONSULTATION:  ?Left Breast Cancer, ER- ? ?REFERRING PHYSICIAN:  ?[facility] ? ? ?ASSESSMENT & PLAN:  ?Gabrielle [name] is a 31 y.o. pre-menopausal female with  ? ?1. 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 B/l MM and left [place] on 03/08/22 showed a 3.7 cm mass at 12 o'clock. Biopsy on 03/11/22 showed metaplastic carcinoma, G3, with Ki67 40%, triple negative  ?--We discussed her imaging findings and the biopsy results in great details. ?-Given the early stage disease, she likely is a candidate for for lumpectomy and sentinel lymph node biopsy. She is agreeable with that. We will obtain MRI to confirm the extent of her disease. She was seen by Dr. Donne Hazel today and likely will proceed with surgery soon. She will have a port placed at the time of surgery.  ?-We discussed the aggressive nature of triple negative breast cancers and high risk of recurrence after surgery. The recommendation and standard of care is chemotherapy.  Unfortunately metaplastic carcinoma is usually not very sensitive to chemotherapy, and that is why we do not recommend a new adjuvant chemotherapy, due to the possibility of cancer progression through chemotherapy before surgery.  However adjuvant chemotherapy is still  recommended and some people may respond to chemo.  We discussed that we cannot tell her if chemo is effective or not after breast surgery.  Based on the keynote 522 clinical trial data, I recommend adjuvant chemotherapy with  weekly carboplatin/taxol for 12 weeks, followed by 4 cycles of adriamycin and cytoxan every 3 weeks. We also give immunotherapy with Keytruda for one year along side chemotherapy.  Potential benefit and side effects of chemotherapy and immunotherapy were discussed with her in detail, she agrees to proceed. ?-I also recommend staging whole-body scans with CT and bone scan to rule out distant metastasis, given the high risk disease and her back pain.  ?-given her ER/PR negativity, she will likely not benefit from antiestrogen therapy. ?-She was also seen by radiation oncologist Dr. Lisbeth Renshaw today. She will likely benefit from breast radiation if she undergo lumpectomy to decrease the risk of breast cancer. ?-We also discussed the breast cancer surveillance after her surgery. She will continue annual screening mammogram, self exam, and a routine office visit with lab and exam with [place]. ?-I encouraged her to have healthy diet and exercise regularly.  ? ?2. Anxiety, Insomnia ?-she reports a history of anxiety, relieved with marijuana ?-she notes recent insomnia, likely related to cancer diagnosis. I will prescribe ambien for her to use only as needed. ? ?3. Family History, Genetics ?-she has a strong family history of breast cancer, in both sides of her family. Given this and her young age, she is eligible for genetic testing. (Of note, Dr. Donne Hazel is also her mother's surgeon) ? ? ?PLAN:  ?-genetics today ?-baseline echo ?-breast MRI ?-staging CT/bone scan ?-She will proceed with lumpectomy and sentinel  lymph node biopsy with Dr. Donne Hazel in the near future, port placement during breast surgery. ?-I will see her back 2 weeks after her surgery to finalize her adjuvant chemotherapy. ? ? ?Oncology  History Overview Note  ? Cancer Staging  ?Malignant neoplasm of upper-outer quadrant of left breast in female, estrogen receptor negative ([place]) ?Staging form: Breast, AJCC 8th Edition ?- Clinical stage from 03/11/2022: Stage IIB (cT2, cN0, cM0, G3, ER-, PR-, HER2-) - Signed by Truitt Merle, MD on 03/19/2022 ? ?  ?Malignant neoplasm of upper-outer quadrant of left breast in female, estrogen receptor negative ([place])  ?03/08/2022 Mammogram  ? CLINICAL DATA:  31 year old female presenting for evaluation of a ?palpable lump in the left breast which she feels has increased in ?size since she first identified it. Her mother was diagnosed with ?breast cancer within the last 6 months. She also has family history ?of breast cancer in multiple aunts and her maternal grandmother. ?  ?EXAM: ?DIGITAL DIAGNOSTIC BILATERAL MAMMOGRAM WITH TOMOSYNTHESIS AND CAD; ?ULTRASOUND LEFT BREAST LIMITED ? ?IMPRESSION: ?1. There is a suspicious 3.7 cm mass in the left breast at 12 ?o'clock. ?  ?2.  No evidence of left axillary lymphadenopathy. ?  ?3.  No evidence of malignancy in the right breast. ?  ?03/11/2022 Cancer Staging  ? Staging form: Breast, AJCC 8th Edition ?- Clinical stage from 03/11/2022: Stage IIB (cT2, cN0, cM0, G3, ER-, PR-, HER2-) - Signed by Truitt Merle, MD on 03/19/2022 ?Stage prefix: Initial diagnosis ?Histologic grading system: 3 grade system ? ?  ?03/11/2022 Initial Biopsy  ? Diagnosis ?Breast, left, needle core biopsy, 12:00 ?- METAPLASTIC CARCINOMA ?- SEE COMMENT ? ?Microscopic Comment ?The biopsy has an invasive epithelial component admixed with chondroid deposition, consistent with a metaplastic ?carcinoma. Based on the biopsy, the carcinoma appears Nottingham grade 3 of 3 and measures 1.2 cm in greatest ?linear extent. ? ?PROGNOSTIC INDICATORS ?Results: ?The tumor cells are NEGATIVE for Her2 (0). ?Estrogen Receptor: 0%, NEGATIVE ?Progesterone Receptor: 0%, NEGATIVE ?Proliferation Marker Ki67: 40% ?  ?03/15/2022 Initial Diagnosis  ?  Malignant neoplasm of upper-outer quadrant of left breast in female, estrogen receptor negative ([place]) ? ?  ? ? ? ?HISTORY OF PRESENTING ILLNESS:  ?[name] 31 y.o. female is a here because of breast cancer. The patient was referred by [facility]. The patient presents to the clinic today alone. ? ? ?She reports she initially noticed a breast lump about 2 months ago, when it was about walnut-sized. She notes the mass has grown since that time. She reports an associated sharp pain, especially when she takes her bra off. She underwent bilateral diagnostic mammography and left breast ultrasonography on 03/08/22 showing: suspicious 3.7 cm mass in left breast at 12 o'clock; no evidence of lymphadenopathy or right breast malignancy. ? ?Biopsy on 03/11/22 showed: metaplastic carcinoma, grade 3, invasive component admixed with chondroid deposition. Prognostic indicators significant for: estrogen receptor, 0% negative and progesterone receptor, 0% negative. Proliferation marker Ki67 at 40%. HER2 negative. ? ? ?Today the patient notes they felt/feeling prior/after... ?-unrelated to her cancer, she reports having flat feet, which causes pain in her right foot. ?-insomnia, likely related to recent diagnosis. She notes she tried tylenol PM but ended up feeling drowsy the next day. She denies taking naps or feeling anxious during the day. ? ?She has a PMHx of.... ?-ruptured disc in her back, seen 2022 (Dr. Gladstone Lighter) ?-high cholesterol, not on medicine ?-no prior surgeries ? ?Socially... ?-she is currently unemployed; she was previously a  Scientist, water quality at [employer]. ?-she is single, with two children. Her two children are 13 and 5. Her youngest lives with her and her oldest stays with their dad. ?-She is unsure whether or not she wants more children. ?-she reports breast cancer in her mother (79 y.o.), a maternal aunt (61 y.o.), a paternal aunt (93 y.o.), her paternal grandmother, and prostate cancer in a paternal  uncle. ?-she smokes marijuana, which helps with her anxiety ?-she only drinks alcohol socially ? ?GYN HISTORY  ?Menarchal: 31 years old ?LMP: late 02/2022 ?Contraceptive: none ?HRT: n/a ?GP: 2 ? ? ?REVIEW OF SYSTEMS:    ?

## 2022-03-20 NOTE — Research (Signed)
[id number] - TREATMENT OF REFRACTORY NAUSEA  ? ?03/20/2022 ? ?CONSENT INTRO:  ?Patient Gabrielle [name] was identified by Dr. Burr Medico as a potential candidate for the above listed study.  This Clinical Research Coordinator met with [name], [id number], on 03/20/22 in a manner and location that ensures patient privacy to discuss participation in the above listed research study.  Patient is Unaccompanied.  A copy of the informed consent document and separate HIPAA Authorization was provided to the patient.  Patient reads, speaks, and understands [place].   ?Patient was provided with the business card of this Coordinator and encouraged to contact the research team with any questions.  Approximately 10 minutes were spent with the patient reviewing the informed consent documents.  Patient was provided the option of taking informed consent documents home to review and was encouraged to review at their convenience with their support network, including other care providers. Patient took the consent documents home to review. Informed patient of voluntary nature of this study as well as the 2 part nature of the study. Patient did not have any questions at this time. This coordinator will follow-up with patient later this week or early next week to discuss any questions/concerns or potential interest.  ? ?Carol Ada, RT(R)(T) ?Clinical Research Coordinator ?  ?

## 2022-03-21 ENCOUNTER — Encounter: Payer: Self-pay | Admitting: Genetic Counselor

## 2022-03-21 ENCOUNTER — Encounter: Payer: Self-pay | Admitting: Licensed Clinical Social Worker

## 2022-03-21 DIAGNOSIS — Z171 Estrogen receptor negative status [ER-]: Secondary | ICD-10-CM

## 2022-03-21 DIAGNOSIS — Z8042 Family history of malignant neoplasm of prostate: Secondary | ICD-10-CM | POA: Insufficient documentation

## 2022-03-21 DIAGNOSIS — Z803 Family history of malignant neoplasm of breast: Secondary | ICD-10-CM

## 2022-03-21 HISTORY — DX: Family history of malignant neoplasm of breast: Z80.3

## 2022-03-21 HISTORY — DX: Family history of malignant neoplasm of prostate: Z80.42

## 2022-03-21 NOTE — Progress Notes (Signed)
REFERRING PROVIDER: ?Truitt Merle, MD ?[address] ?[place],  [place] [address] ? ?PRIMARY PROVIDER:  ?Trey Sailors, PA ? ?PRIMARY REASON FOR VISIT:  ?1. Malignant neoplasm of upper-outer quadrant of left breast in female, estrogen receptor negative ([place])   ?2. Family history of breast cancer   ?3. Family history of prostate cancer   ? ? ?HISTORY OF PRESENT ILLNESS:   ?Ms. Cubillos, a 31 y.o. female, was seen for a [place] cancer genetics consultation during the breast multidisciplinary clinic at the request of Dr. Burr Medico due to a personal and family history of cancer.  Ms. Belli presents to clinic today to discuss the possibility of a hereditary predisposition to cancer, to discuss genetic testing, and to further clarify her future cancer risks, as well as potential cancer risks for family members.  ? ?In April 2023, at the age of 61, Ms. Gervais was diagnosed with left breast cancer (triple negative).  The treatment plan is pending.  ? ?CANCER HISTORY:  ?Oncology History Overview Note  ? Cancer Staging  ?Malignant neoplasm of upper-outer quadrant of left breast in female, estrogen receptor negative ([place]) ?Staging form: Breast, AJCC 8th Edition ?- Clinical stage from 03/11/2022: Stage IIB (cT2, cN0, cM0, G3, ER-, PR-, HER2-) - Signed by Truitt Merle, MD on 03/19/2022 ? ?  ?Malignant neoplasm of upper-outer quadrant of left breast in female, estrogen receptor negative ([place])  ?03/08/2022 Mammogram  ? CLINICAL DATA:  31 year old female presenting for evaluation of a ?palpable lump in the left breast which she feels has increased in ?size since she first identified it. Her mother was diagnosed with ?breast cancer within the last 6 months. She also has family history ?of breast cancer in multiple aunts and her maternal grandmother. ?  ?EXAM: ?DIGITAL DIAGNOSTIC BILATERAL MAMMOGRAM WITH TOMOSYNTHESIS AND CAD; ?ULTRASOUND LEFT BREAST LIMITED ? ?IMPRESSION: ?1. There is a suspicious 3.7 cm mass in the left breast at  12 ?o'clock. ?  ?2.  No evidence of left axillary lymphadenopathy. ?  ?3.  No evidence of malignancy in the right breast. ?  ?03/11/2022 Cancer Staging  ? Staging form: Breast, AJCC 8th Edition ?- Clinical stage from 03/11/2022: Stage IIB (cT2, cN0, cM0, G3, ER-, PR-, HER2-) - Signed by Truitt Merle, MD on 03/19/2022 ?Stage prefix: Initial diagnosis ?Histologic grading system: 3 grade system ? ?  ?03/11/2022 Initial Biopsy  ? Diagnosis ?Breast, left, needle core biopsy, 12:00 ?- METAPLASTIC CARCINOMA ?- SEE COMMENT ? ?Microscopic Comment ?The biopsy has an invasive epithelial component admixed with chondroid deposition, consistent with a metaplastic ?carcinoma. Based on the biopsy, the carcinoma appears Nottingham grade 3 of 3 and measures 1.2 cm in greatest ?linear extent. ? ?PROGNOSTIC INDICATORS ?Results: ?The tumor cells are NEGATIVE for Her2 (0). ?Estrogen Receptor: 0%, NEGATIVE ?Progesterone Receptor: 0%, NEGATIVE ?Proliferation Marker Ki67: 40% ?  ?03/15/2022 Initial Diagnosis  ? Malignant neoplasm of upper-outer quadrant of left breast in female, estrogen receptor negative ([place]) ? ?  ? ? ?Past Medical History:  ?Diagnosis Date  ? Family history of breast cancer 03/21/2022  ? Family history of prostate cancer 03/21/2022  ? Hypercholesteremia   ? ? ?Past Surgical History:  ?Procedure Laterality Date  ? BREAST BIOPSY Left 03/11/2022  ? DENTAL SURGERY    ? ? ? ?FAMILY HISTORY:  ?We obtained a detailed, 4-generation family history.  Significant diagnoses are listed below: ?Family History  ?Problem Relation Age of Onset  ? Breast cancer Mother 67  ? Prostate cancer Father 47  ?  Breast cancer Maternal Aunt 60  ? Breast cancer Paternal Aunt   ?     dx unknown age  ? Prostate cancer Paternal Uncle   ?     dx after 75  ? Breast cancer Paternal Grandmother   ?     dx after 33  ? Prostate cancer Paternal Grandfather   ?     dx 71s; metastatic  ? ? ? ?Ms. Dwight is unaware of previous family history of genetic testing for hereditary  cancer risks. There is no reported Ashkenazi Jewish ancestry. There is no known consanguinity. ? ?GENETIC COUNSELING ASSESSMENT: Ms. Osoria is a 31 y.o. female with a personal and family history of cancer which is somewhat suggestive of a hereditary cancer syndrome and predisposition to cancer given her age of diagnosis and the presence of related cancers in the family. We, therefore, discussed and recommended the following at today's visit.  ? ?DISCUSSION: We discussed that 5 - 10% of cancer is hereditary, with most cases of hereditary breast cancer associated with mutations in BRCA1/2.  There are other genes that can be associated with hereditary breast and prostate cancer syndromes.  Type of cancer risk and level of risk are gene-specific. We discussed that testing is beneficial for several reasons including knowing how to follow individuals after completing their treatment, identifying whether potential treatment options would be beneficial, and understanding if other family members could be at risk for cancer and allowing them to undergo genetic testing.  ? ?We reviewed the characteristics, features and inheritance patterns of hereditary cancer syndromes. We also discussed genetic testing, including the appropriate family members to test, the process of testing, insurance coverage and turn-around-time for results. We discussed the implications of a negative, positive and/or variant of uncertain significant result. In order to get genetic test results in a timely manner so that Ms. Brazee can use these genetic test results for surgical decisions, we recommended Ms. Kennon Rounds pursue genetic testing for the [employer].  The BRCAplus panel offered by [employer] and includes sequencing and deletion/duplication analysis for the following 8 genes: ATM, BRCA1, BRCA2, CDH1, CHEK2, PALB2, PTEN, and TP53. Once complete, we recommend Ms. Kennon Rounds pursue reflex genetic testing to a more comprehensive gene panel.   ? ?The CustomNext-Cancer+RNAinsight panel offered by [facility] includes sequencing and rearrangement analysis for the following 47 genes:  APC, ATM, AXIN2, BARD1, BMPR1A, BRCA1, BRCA2, BRIP1, CDH1, CDK4, CDKN2A, CHEK2, DICER1, EPCAM, GREM1, HOXB13, MEN1, MLH1, MSH2, MSH3, MSH6, MUTYH, NBN, NF1, NF2, NTHL1, PALB2, PMS2, POLD1, POLE, PTEN, RAD51C, RAD51D, RECQL, RET, SDHA, SDHAF2, SDHB, SDHC, SDHD, SMAD4, SMARCA4, STK11, TP53, TSC1, TSC2, and VHL.  RNA data is routinely analyzed for use in variant interpretation for all genes. ? ?Based on Ms. Woolridge's personal history of cancer, she meets medical criteria for genetic testing.  ? ?PLAN: After considering the risks, benefits, and limitations, Ms. Whitehair provided informed consent to pursue genetic testing and the blood sample was sent to [facility] for analysis of the [facility] and CustomNext-Cancer +RNAinsight Panel. Results should be available within approximately 1-2 weeks' time, at which point they will be disclosed by telephone to Ms. Merk, as will any additional recommendations warranted by these results. Ms. Willis will receive a summary of her genetic counseling visit and a copy of her results once available. This information will also be available in Epic.  ? ? ?Lastly, we encouraged Ms. Arseneault to remain in contact with cancer genetics annually so that we can continuously update  the family history and inform her of any changes in cancer genetics and testing that may be of benefit for this family.  ? ?Ms. Alejos's questions were answered to her satisfaction today. Our contact information was provided should additional questions or concerns arise. Thank you for the referral and allowing [place] to share in the care of your patient.  ? ?Dejour Vos M. Joette Catching, [place], [place] ?Genetic Counselor ?[email or web address] ?(P) [phone] ? ?The patient was seen for a total of 20 minutes in face-to-face genetic counseling. The patient was seen alone.  Drs. Magrinat, Lindi Adie  and/or Burr Medico were available to discuss this case as needed.  ?_______________________________________________________________________ ?For Office Staff:  ?Number of people involved in session: 1 ?Was an Intern/ s

## 2022-03-21 NOTE — Progress Notes (Signed)
[facility] CSW Progress Note ? ?Clinical Social Worker contacted patient by phone to introduce self and provide contact details.  CSW inquired if pt is familiar w/ the [facility], which she is as her mother receives supportive services from them.  Pt verbalized agreement w/ a referral which CSW sent on pt's behalf.  CSW also provided pt w/ contact details for financial counseling to be contacted once treatment begins as pt may be eligible for [employer].  CSW to remain available to provide supportive services as appropriate throughout duration of treatment. ? ? ? ?Henriette Combs , LCSW ?

## 2022-03-22 ENCOUNTER — Other Ambulatory Visit: Payer: Self-pay | Admitting: Hematology

## 2022-03-22 ENCOUNTER — Telehealth: Payer: Self-pay | Admitting: *Deleted

## 2022-03-22 ENCOUNTER — Ambulatory Visit
Admission: RE | Admit: 2022-03-22 | Discharge: 2022-03-22 | Disposition: A | Discharge status: Home / Self Care | Payer: Medicaid Other | Source: Ambulatory Visit | Attending: Hematology | Admitting: Hematology

## 2022-03-22 DIAGNOSIS — Z171 Estrogen receptor negative status [ER-]: Secondary | ICD-10-CM

## 2022-03-22 MED ORDER — PROCHLORPERAZINE MALEATE 10 MG PO TABS: 10.0000 mg | ORAL_TABLET | Freq: Four times a day (QID) | ORAL | 0 refills | Status: DC | PRN

## 2022-03-22 MED ORDER — GADOBUTROL 1 MMOL/ML IV SOLN
10.0000 mL | Freq: Once | INTRAVENOUS | Status: AC | PRN
  Administered 2022-03-22: 10 mL via INTRAVENOUS

## 2022-03-22 NOTE — Telephone Encounter (Signed)
Spoke with patient to review her appts. She stated when she went for her MRI today she got nauseated and sick when they gave her the contrast and was unable to complete it. She is r/s for Monday and per Dr. Burr Medico will call in Compazine for her.  She also states she does want a reduction at this time, just lump sln and port.  ?

## 2022-03-25 ENCOUNTER — Ambulatory Visit
Admission: RE | Admit: 2022-03-25 | Discharge: 2022-03-25 | Disposition: A | Discharge status: Home / Self Care | Payer: Medicaid Other | Source: Ambulatory Visit | Attending: Hematology | Admitting: Hematology

## 2022-03-25 ENCOUNTER — Ambulatory Visit (HOSPITAL_COMMUNITY): Payer: Medicaid Other

## 2022-03-25 ENCOUNTER — Ambulatory Visit (HOSPITAL_COMMUNITY)
Admission: RE | Admit: 2022-03-25 | Discharge: 2022-03-25 | Disposition: A | Discharge status: Home / Self Care | Payer: Medicaid Other | Source: Ambulatory Visit | Attending: Hematology | Admitting: Hematology

## 2022-03-25 DIAGNOSIS — Z171 Estrogen receptor negative status [ER-]: Secondary | ICD-10-CM

## 2022-03-25 DIAGNOSIS — Z5111 Encounter for antineoplastic chemotherapy: Secondary | ICD-10-CM | POA: Insufficient documentation

## 2022-03-25 DIAGNOSIS — C50412 Malignant neoplasm of upper-outer quadrant of left female breast: Secondary | ICD-10-CM | POA: Diagnosis present

## 2022-03-25 DIAGNOSIS — E785 Hyperlipidemia, unspecified: Secondary | ICD-10-CM | POA: Diagnosis not present

## 2022-03-25 DIAGNOSIS — Z0189 Encounter for other specified special examinations: Secondary | ICD-10-CM | POA: Diagnosis not present

## 2022-03-25 LAB — ECHOCARDIOGRAM COMPLETE
Area-P 1/2: 2.62 cm2
Calc EF: 61.7 %
S' Lateral: 3.5 cm
Single Plane A2C EF: 61.6 %
Single Plane A4C EF: 59.5 %

## 2022-03-25 MED ORDER — GADOBUTROL 1 MMOL/ML IV SOLN
10.0000 mL | Freq: Once | INTRAVENOUS | Status: AC | PRN
  Administered 2022-03-25: 10 mL via INTRAVENOUS

## 2022-03-25 NOTE — Progress Notes (Signed)
?  Echocardiogram ?2D Echocardiogram has been performed. ? ?[name] ?03/25/2022, 9:44 AM ?

## 2022-03-26 ENCOUNTER — Ambulatory Visit (HOSPITAL_COMMUNITY)
Admission: RE | Admit: 2022-03-26 | Discharge: 2022-03-26 | Disposition: A | Discharge status: Home / Self Care | Payer: Medicaid Other | Source: Ambulatory Visit | Attending: Hematology | Admitting: Hematology

## 2022-03-26 ENCOUNTER — Other Ambulatory Visit: Payer: Self-pay | Admitting: *Deleted

## 2022-03-26 ENCOUNTER — Ambulatory Visit (HOSPITAL_COMMUNITY): Payer: Medicaid Other

## 2022-03-26 ENCOUNTER — Other Ambulatory Visit: Payer: Self-pay | Admitting: General Surgery

## 2022-03-26 DIAGNOSIS — C50412 Malignant neoplasm of upper-outer quadrant of left female breast: Secondary | ICD-10-CM | POA: Diagnosis not present

## 2022-03-26 MED ORDER — TECHNETIUM TC 99M MEDRONATE IV KIT
21.4000 | PACK | Freq: Once | INTRAVENOUS | Status: AC
  Administered 2022-03-26: 21.4 via INTRAVENOUS

## 2022-03-26 MED ORDER — IOHEXOL 300 MG/ML  SOLN
100.0000 mL | Freq: Once | INTRAMUSCULAR | Status: AC | PRN
  Administered 2022-03-26: 100 mL via INTRAVENOUS

## 2022-03-26 MED ORDER — SODIUM CHLORIDE (PF) 0.9 % IJ SOLN
INTRAMUSCULAR | Status: AC
  Filled 2022-03-26: qty 50

## 2022-03-27 NOTE — Therapy (Signed)
?OUTPATIENT PHYSICAL THERAPY BREAST CANCER BASELINE EVALUATION ? ? ?Patient Name: Gabrielle [name] ?MRN: [id number] ?DOB:October 31, 1991, 31 y.o., female ?Today's Date: 03/28/2022 ? ? PT End of Session - 03/28/22 0803   ? ? Visit Number 1   ? Number of Visits 2   ? Date for PT Re-Evaluation 05/09/22   ? PT Start Time [phone]   ? PT Stop Time [phone]   ? PT Time Calculation (min) 28 min   ? Activity Tolerance Patient tolerated treatment well   ? Behavior During Therapy [facility] for tasks assessed/performed   ? ?  ?  ? ?  ? ? ?Past Medical History:  ?Diagnosis Date  ? Family history of breast cancer 03/21/2022  ? Family history of prostate cancer 03/21/2022  ? Hypercholesteremia   ? ?Past Surgical History:  ?Procedure Laterality Date  ? BREAST BIOPSY Left 03/11/2022  ? DENTAL SURGERY    ? ?Patient Active Problem List  ? Diagnosis Date Noted  ? Family history of breast cancer 03/21/2022  ? Family history of prostate cancer 03/21/2022  ? Malignant neoplasm of upper-outer quadrant of left breast in female, estrogen receptor negative ([place]) 03/15/2022  ? ? ?PCP: Raelyn Number, PA ? ?REFERRING PROVIDER: Dr. Burr Medico ? ?REFERRING DIAG: Lt breast cancer ? ?THERAPY DIAG:  ?Malignant neoplasm of upper-outer quadrant of left breast in female, estrogen receptor negative ([place]) ? ?Abnormal posture ? ?ONSET DATE: 03/18/22 ? ?SUBJECTIVE                                                                                                                                                                                          ? ?SUBJECTIVE STATEMENT: ?Patient reports she is here today to be seen by her medical team for her newly diagnosed left breast cancer.  ? ?PERTINENT HISTORY:  ?Patient was diagnosed with new left breast cancer. Diagnostic MMG/US showed a 3.7 cm mass in left breast at 12 o'clock; no evidence of lymphadenopathy or right breast malignancy. Biopsy demonstrated metaplastic carcinoma, admixed with chondroid deposition, ER/PR-, Her2-.  Will be having  lumpectomy 04/16/22 with SLNB.  Will be having radiation and chemotherapy  ? ?PATIENT GOALS   reduce lymphedema risk and learn post op HEP.  ? ?PAIN:  ?Are you having pain? No ? ? ?PRECAUTIONS: Active CA  ? ?HAND DOMINANCE: right ? ?WEIGHT BEARING RESTRICTIONS No ? ?FALLS:  ?Has patient fallen in last 6 months? No ? ?LIVING ENVIRONMENT: ?Patient lives with: 72 year old, sister, sister's girlfriend and 2 nieces ?Lives in: House/apartment ? ?OCCUPATION: not currently working ? ?LEISURE: taking care of kids ? ?PRIOR LEVEL OF FUNCTION: Independent ? ? ?OBJECTIVE ? ?  COGNITION: ? Overall cognitive status: Within functional limits for tasks assessed   ? ?POSTURE:  ?Forward head and rounded shoulders posture ? ?UPPER EXTREMITY AROM/PROM: ? ?A/PROM RIGHT  03/28/2022 ?  ?Shoulder extension   ?Shoulder flexion   ?Shoulder abduction   ?Shoulder internal rotation   ?Shoulder external rotation   ?  (Blank rows = not tested) ? ?A/PROM LEFT  03/28/2022  ?Shoulder extension 54  ?Shoulder flexion 155  ?Shoulder abduction 165  ?Shoulder internal rotation   ?Shoulder external rotation 95  ?  (Blank rows = not tested) ? ? ?CERVICAL AROM: ?All within normal limits:  ? ?LYMPHEDEMA ASSESSMENTS:  ? ?[place] RIGHT  03/28/2022  ?10 cm proximal to olecranon process   ?Olecranon process   ?10 cm proximal to ulnar styloid process   ?Just proximal to ulnar styloid process   ?Across hand at thumb web space   ?At base of 2nd digit   ?(Blank rows = not tested) ? ?[place] LEFT  03/28/2022  ?10 cm proximal to olecranon process 40  ?Olecranon process 28  ?10 cm proximal to ulnar styloid process 22  ?Just proximal to ulnar styloid process 16.5  ?Across hand at thumb web space 19.5  ?At base of 2nd digit 6.4  ?(Blank rows = not tested) ? ? ?L-DEX LYMPHEDEMA SCREENING: ? ?The patient was assessed using the L-Dex machine today to produce a lymphedema index baseline score. The patient will be reassessed on a regular basis (typically every 3 months) to obtain  new L-Dex scores. If the score is > 6.5 points away from his/her baseline score indicating onset of subclinical lymphedema, it will be recommended to wear a compression garment for 4 weeks, 12 hours per day and then be reassessed. If the score continues to be > 6.5 points from baseline at reassessment, we will initiate lymphedema treatment. Assessing in this manner has a 95% rate of preventing clinically significant lymphedema. ? ? ?QUICK DASH SURVEY:0% ? ? ?PATIENT EDUCATION:  ?Education details: Lymphedema risk reduction and post op shoulder/posture HEP ?Person educated: Patient ?Education method: Explanation, Demonstration, Handout ?Education comprehension: Patient verbalized understanding and returned demonstration ? ? ?HOME EXERCISE PROGRAM: ?Patient was instructed today in a home exercise program today for post op shoulder range of motion. These included active assist shoulder flexion in sitting, scapular retraction, wall walking with shoulder abduction, and hands behind head external rotation.  She was encouraged to do these twice a day, holding 3 seconds and repeating 5 times when permitted by her physician. ? ? ?ASSESSMENT: ? ?CLINICAL IMPRESSION: ?Pt is planning to have a left lumpectomy and SLNB along with adjuvant chemotherapy and radiation. She will benefit from a post op PT reassessment to determine needs and from L-Dex screens every 3 months for 2 years to detect subclinical lymphedema. ? ?Pt will benefit from skilled therapeutic intervention to improve on the following deficits: Decreased knowledge of precautions, impaired UE functional use, pain, decreased ROM, postural dysfunction.  ? ?PT treatment/interventions: ADL/self-care home management, pt/family education, therapeutic exercise ? ?REHAB POTENTIAL: Excellent ? ?CLINICAL DECISION MAKING: Stable/uncomplicated ? ?EVALUATION COMPLEXITY: Low ? ? ?GOALS: ?Goals reviewed with patient? YES ? ?LONG TERM GOALS: (STG=LTG) ? ? Name Target Date Goal  status  ?1 Pt will be able to verbalize understanding of pertinent lymphedema risk reduction practices relevant to her dx specifically related to skin care.  ?Baseline:  No knowledge 03/28/2022 Achieved at eval  ?2 Pt will be able to return demo and/or verbalize understanding of the post op HEP  related to regaining shoulder ROM. ?Baseline:  No knowledge 03/28/2022 Achieved at eval  ?3 Pt will be able to verbalize understanding of the importance of attending the post op After Breast CA Class for further lymphedema risk reduction education and therapeutic exercise.  ?Baseline:  No knowledge 03/28/2022 Achieved at eval  ?4 Pt will demo she has regained full shoulder ROM and function post operatively compared to baselines.  ?Baseline: See objective measurements taken today. 05/09/22   ? ? ? ?PLAN: ?PT FREQUENCY/DURATION: EVAL and 1 follow up appointment.  ? ?PLAN FOR NEXT SESSION: will reassess 3-4 weeks post op to determine needs. ?  ?Patient will follow up at outpatient cancer rehab 3-4 weeks following surgery.  If the patient requires physical therapy at that time, a specific plan will be dictated and sent to the referring physician for approval. ?The patient was educated today on appropriate basic range of motion exercises to begin post operatively and the importance of attending the After Breast Cancer class following surgery.  Patient was educated today on lymphedema risk reduction practices as it pertains to recommendations that will benefit the patient immediately following surgery.  She verbalized good understanding.   ? ?Physical Therapy Information for After Breast Cancer Surgery/Treatment: ? ?Lymphedema is a swelling condition that you may be at risk for in your arm if you have lymph nodes removed from the armpit area.  After a sentinel node biopsy, the risk is approximately 5-9% and is higher after an axillary node dissection.  There is treatment available for this condition and it is not life-threatening.   Contact your physician or physical therapist with concerns. ?You may begin the 4 shoulder/posture exercises (see additional sheet) when permitted by your physician (typically a week after surgery).  If you have dra

## 2022-03-28 ENCOUNTER — Telehealth: Payer: Self-pay | Admitting: *Deleted

## 2022-03-28 ENCOUNTER — Encounter: Payer: Self-pay | Admitting: *Deleted

## 2022-03-28 ENCOUNTER — Encounter: Payer: Self-pay | Admitting: Hematology

## 2022-03-28 ENCOUNTER — Ambulatory Visit: Payer: Medicaid Other | Attending: Hematology | Admitting: Rehabilitation

## 2022-03-28 ENCOUNTER — Encounter: Payer: Self-pay | Admitting: Rehabilitation

## 2022-03-28 DIAGNOSIS — R293 Abnormal posture: Secondary | ICD-10-CM | POA: Insufficient documentation

## 2022-03-28 DIAGNOSIS — C50412 Malignant neoplasm of upper-outer quadrant of left female breast: Secondary | ICD-10-CM | POA: Diagnosis present

## 2022-03-28 DIAGNOSIS — Z171 Estrogen receptor negative status [ER-]: Secondary | ICD-10-CM | POA: Insufficient documentation

## 2022-03-28 NOTE — Telephone Encounter (Signed)
Spoke with patient to follow up from [facility] 5/3 and assess navigation needs.  Patient denies any questions or concerns at this time.  Encouraged her to call should anything arise. Patient verbalized understanding.  ?

## 2022-03-29 ENCOUNTER — Encounter: Payer: Self-pay | Admitting: Genetic Counselor

## 2022-03-29 ENCOUNTER — Telehealth: Payer: Self-pay | Admitting: Genetic Counselor

## 2022-03-29 DIAGNOSIS — Z1379 Encounter for other screening for genetic and chromosomal anomalies: Secondary | ICD-10-CM | POA: Insufficient documentation

## 2022-03-29 NOTE — Telephone Encounter (Signed)
?  Revealed negative genetic testing for BRCAPlus STAT panel.  Discussed that we do not know why she has breast  cancer or why there is cancer in the family. It could be sporadic/famillial, due to a change in a gene that she did not inherit, due to a different gene that we are not testing, or maybe our current technology may not be able to pick something up.  It will be important for her to keep in contact with genetics to keep up with whether additional testing may be needed.  Results of pan-cancer panel are pending.  ? ? ?\ ?

## 2022-04-02 ENCOUNTER — Telehealth: Payer: Self-pay | Admitting: Radiology

## 2022-04-02 NOTE — Telephone Encounter (Signed)
[employer] 2021-05 - Specimen Collection Study to Evaluate Biomarkers in Subjects with Cancer   ? ?04/02/2022 ? ?PHONE CALL: Confirmed I was speaking with [name] . Informed patient reason for call was to follow-up about the above mentioned study. Patient stated she has read the consent form and is interested in participation. An appointment time was created for Friday 04/05/2022 at 10AM for consent and 10:30AM for lab collection. Patient was thanked for her time and consideration of the above mentioned study.  ? ?Carol Ada, RT(R)(T) ?Clinical Research Coordinator ? ?

## 2022-04-03 ENCOUNTER — Ambulatory Visit: Payer: Self-pay | Admitting: Genetic Counselor

## 2022-04-03 DIAGNOSIS — Z8042 Family history of malignant neoplasm of prostate: Secondary | ICD-10-CM

## 2022-04-03 DIAGNOSIS — Z803 Family history of malignant neoplasm of breast: Secondary | ICD-10-CM

## 2022-04-03 DIAGNOSIS — Z1379 Encounter for other screening for genetic and chromosomal anomalies: Secondary | ICD-10-CM

## 2022-04-03 DIAGNOSIS — Z171 Estrogen receptor negative status [ER-]: Secondary | ICD-10-CM

## 2022-04-03 NOTE — Telephone Encounter (Signed)
Revealed negative pan-cancer panel.    

## 2022-04-03 NOTE — Progress Notes (Signed)
HPI:   ?Ms. Coventry was previously seen in the [place] clinic due to a personal and family history of cancer and concerns regarding a hereditary predisposition to cancer. Please refer to our prior cancer genetics clinic note for more information regarding our discussion, assessment and recommendations, at the time. Ms. Blomquist recent genetic test results were disclosed to her, as were recommendations warranted by these results. These results and recommendations are discussed in more detail below. ? ?CANCER HISTORY:  ?Oncology History Overview Note  ? Cancer Staging  ?Malignant neoplasm of upper-outer quadrant of left breast in female, estrogen receptor negative ([place]) ?Staging form: Breast, AJCC 8th Edition ?- Clinical stage from 03/11/2022: Stage IIB (cT2, cN0, cM0, G3, ER-, PR-, HER2-) - Signed by Truitt Merle, MD on 03/19/2022 ? ?  ?Malignant neoplasm of upper-outer quadrant of left breast in female, estrogen receptor negative ([place])  ?03/08/2022 Mammogram  ? CLINICAL DATA:  31 year old female presenting for evaluation of a ?palpable lump in the left breast which she feels has increased in ?size since she first identified it. Her mother was diagnosed with ?breast cancer within the last 6 months. She also has family history ?of breast cancer in multiple aunts and her maternal grandmother. ?  ?EXAM: ?DIGITAL DIAGNOSTIC BILATERAL MAMMOGRAM WITH TOMOSYNTHESIS AND CAD; ?ULTRASOUND LEFT BREAST LIMITED ? ?IMPRESSION: ?1. There is a suspicious 3.7 cm mass in the left breast at 12 ?o'clock. ?  ?2.  No evidence of left axillary lymphadenopathy. ?  ?3.  No evidence of malignancy in the right breast. ?  ?03/11/2022 Cancer Staging  ? Staging form: Breast, AJCC 8th Edition ?- Clinical stage from 03/11/2022: Stage IIB (cT2, cN0, cM0, G3, ER-, PR-, HER2-) - Signed by Truitt Merle, MD on 03/19/2022 ?Stage prefix: Initial diagnosis ?Histologic grading system: 3 grade system ? ?  ?03/11/2022 Initial Biopsy  ? Diagnosis ?Breast,  left, needle core biopsy, 12:00 ?- METAPLASTIC CARCINOMA ?- SEE COMMENT ? ?Microscopic Comment ?The biopsy has an invasive epithelial component admixed with chondroid deposition, consistent with a metaplastic ?carcinoma. Based on the biopsy, the carcinoma appears Nottingham grade 3 of 3 and measures 1.2 cm in greatest ?linear extent. ? ?PROGNOSTIC INDICATORS ?Results: ?The tumor cells are NEGATIVE for Her2 (0). ?Estrogen Receptor: 0%, NEGATIVE ?Progesterone Receptor: 0%, NEGATIVE ?Proliferation Marker Ki67: 40% ?  ?03/15/2022 Initial Diagnosis  ? Malignant neoplasm of upper-outer quadrant of left breast in female, estrogen receptor negative ([place]) ? ?  ?03/28/2022 Genetic Testing  ? Negative hereditary cancer genetic testing: no pathogenic variants detected in [facility] BRCAPlus Panel or [facility] CustomNext-Cancer +RNAinsight Panel.  Report dates are 03/28/2022 and 03/31/2022. .  ? ?The BRCAplus panel offered by [employer] and includes sequencing and deletion/duplication analysis for the following 8 genes: ATM, BRCA1, BRCA2, CDH1, CHEK2, PALB2, PTEN, and TP53.  The CustomNext-Cancer+RNAinsight panel offered by [facility] includes sequencing and rearrangement analysis for the following 47 genes:  APC, ATM, AXIN2, BARD1, BMPR1A, BRCA1, BRCA2, BRIP1, CDH1, CDK4, CDKN2A, CHEK2, DICER1, EPCAM, GREM1, HOXB13, MEN1, MLH1, MSH2, MSH3, MSH6, MUTYH, NBN, NF1, NF2, NTHL1, PALB2, PMS2, POLD1, POLE, PTEN, RAD51C, RAD51D, RECQL, RET, SDHA, SDHAF2, SDHB, SDHC, SDHD, SMAD4, SMARCA4, STK11, TP53, TSC1, TSC2, and VHL.  RNA data is routinely analyzed for use in variant interpretation for all genes. ?  ? ? ?FAMILY HISTORY:  ?We obtained a detailed, 4-generation family history.  Significant diagnoses are listed below: ?     ?Family History  ?Problem Relation Age of Onset  ? Breast cancer Mother 46  ?  Prostate cancer Father 96  ? Breast cancer Maternal Aunt 57  ? Breast cancer Paternal Aunt    ?      dx unknown age  ? Prostate cancer  Paternal Uncle    ?      dx after 86  ? Breast cancer Paternal Grandmother    ?      dx after 20  ? Prostate cancer Paternal Grandfather    ?      dx 89s; metastatic  ?  ?  ?Ms. Heidt is unaware of previous family history of genetic testing for hereditary cancer risks. There is no reported Ashkenazi Jewish ancestry. There is no known consanguinity. ?  ? ?GENETIC TEST RESULTS:  ?The [employer] CustomNext-Cancer +RNAinsight Panel found no pathogenic mutations ? ?The CustomNext-Cancer+RNAinsight panel offered by [facility] includes sequencing and rearrangement analysis for the following 47 genes:  APC, ATM, AXIN2, BARD1, BMPR1A, BRCA1, BRCA2, BRIP1, CDH1, CDK4, CDKN2A, CHEK2, DICER1, EPCAM, GREM1, HOXB13, MEN1, MLH1, MSH2, MSH3, MSH6, MUTYH, NBN, NF1, NF2, NTHL1, PALB2, PMS2, POLD1, POLE, PTEN, RAD51C, RAD51D, RECQL, RET, SDHA, SDHAF2, SDHB, SDHC, SDHD, SMAD4, SMARCA4, STK11, TP53, TSC1, TSC2, and VHL.  RNA data is routinely analyzed for use in variant interpretation for all genes. ? ?The test report has been scanned into EPIC and is located under the Molecular Pathology section of the Results Review tab.  A portion of the result report is included below for reference. Genetic testing reported out on Mar 31, 2022.  ? ? ? ? ?Even though a pathogenic variant was not identified, possible explanations for the cancer in the family may include: ?There may be no hereditary risk for cancer in the family. The cancers in Ms. Mcelvain and/or her family may be familial or due to other genetic and environmental factors. ?There may be a gene mutation in one of these genes that current testing methods cannot detect but that chance is small. ?There could be another gene that has not yet been discovered, or that we have not yet tested, that is responsible for the cancer diagnoses in the family.  ?It is also possible there is a hereditary cause for the cancer in the family that Ms. Belgrave did not inherit. ? ?Therefore, it is important to  remain in touch with cancer genetics in the future so that we can continue to offer Ms. Hemminger the most up to date genetic testing.  ? ?ADDITIONAL GENETIC TESTING:  ?We discussed with Ms. Welte that her genetic testing was fairly extensive.  If there are additional relevant genes identified to increase cancer risk that can be analyzed in the future, we would be happy to discuss and coordinate this testing at that time.   ? ?CANCER SCREENING RECOMMENDATIONS:  ?Ms. Belling's test result is considered negative (normal).  This means that we have not identified a hereditary cause for her personal history of breast cancer at this time.  ? ?An individual's cancer risk and medical management are not determined by genetic test results alone. Overall cancer risk assessment incorporates additional factors, including personal medical history, family history, and any available genetic information that may result in a personalized plan for cancer prevention and surveillance. Therefore, it is recommended she continue to follow the cancer management and screening guidelines provided by her oncology and primary healthcare provider. ? ?RECOMMENDATIONS FOR FAMILY MEMBERS:   ?Since she did not inherit a identifiable mutation in a cancer predisposition gene included on this panel, her children could not have inherited a known mutation  from her in one of these genes. ?Individuals in this family might be at some increased risk of developing cancer, over the general population risk, due to the family history of cancer.  Individuals in the family should notify their providers of the family history of cancer. We recommend women in this family have a yearly mammogram beginning at age 19, or 24 years younger than the earliest onset of cancer, an annual clinical breast exam, and perform monthly breast self-exams.  Males in the family should speak with their providers about prostate cancer screening. ?Other members of the family may still carry a  pathogenic variant in one of these genes that Ms. Molenda did not inherit. Based on the family history, we recommend her father, who was diagnosed with prostate cancer at age 17, have genetic counseling and testing.

## 2022-04-04 ENCOUNTER — Encounter (HOSPITAL_BASED_OUTPATIENT_CLINIC_OR_DEPARTMENT_OTHER): Payer: Self-pay | Admitting: General Surgery

## 2022-04-05 ENCOUNTER — Encounter: Payer: Medicaid Other | Admitting: Radiology

## 2022-04-05 ENCOUNTER — Other Ambulatory Visit: Payer: Self-pay

## 2022-04-05 ENCOUNTER — Other Ambulatory Visit: Payer: Medicaid Other

## 2022-04-05 ENCOUNTER — Telehealth: Payer: Self-pay | Admitting: Radiology

## 2022-04-05 ENCOUNTER — Encounter (HOSPITAL_BASED_OUTPATIENT_CLINIC_OR_DEPARTMENT_OTHER): Payer: Self-pay | Admitting: General Surgery

## 2022-04-05 NOTE — Telephone Encounter (Signed)
[employer] 2021-05 - Specimen Collection Study to Evaluate Biomarkers in Subjects with Cancer    04/05/22  PHONE CALL: Confirmed I was speaking with Gabrielle [name] . Informed patient reason for call was to confirm appt on Monday 5/22 at 10AM for consent and blood collection. Patient confirmed appt. Thanked patient for her time and looking forward to speaking with her then.   Carol Ada, RT(R)(T) Clinical Research Coordinator

## 2022-04-08 ENCOUNTER — Other Ambulatory Visit: Payer: Self-pay | Admitting: *Deleted

## 2022-04-08 ENCOUNTER — Inpatient Hospital Stay: Payer: Medicaid Other | Admitting: Radiology

## 2022-04-08 ENCOUNTER — Inpatient Hospital Stay: Payer: Medicaid Other

## 2022-04-08 ENCOUNTER — Other Ambulatory Visit: Payer: Self-pay

## 2022-04-08 DIAGNOSIS — C50412 Malignant neoplasm of upper-outer quadrant of left female breast: Secondary | ICD-10-CM

## 2022-04-08 DIAGNOSIS — Z006 Encounter for examination for normal comparison and control in clinical research program: Secondary | ICD-10-CM

## 2022-04-08 LAB — RESEARCH LABS

## 2022-04-08 NOTE — Progress Notes (Signed)

## 2022-04-08 NOTE — Research (Signed)
[employer] 2021-05 - Specimen Collection Study to Evaluate Biomarkers in Subjects with Cancer   This Nurse has reviewed this patient's inclusion and exclusion criteria as a second review and confirms Gabrielle [name] is eligible for study participation.  Patient may continue with enrollment.  Foye Spurling, BSN, RN, [employer] Research Nurse II 04/08/2022 10:09 AM

## 2022-04-08 NOTE — Research (Signed)
[employer] 2021-05 - Specimen Collection Study to Evaluate Biomarkers in Subjects with Cancer    04/08/2022  ELIGIBILITY:  This Coordinator has reviewed this patient's inclusion and exclusion criteria and confirmed Gabrielle [name] is eligible for study participation.  Patient will continue with enrollment.  Eligibility confirmed by treating investigator and research nurse who also agrees that patient should proceed with enrollment.   CONSENT:  Patient Gabrielle [name] was identified by Dr. Burr Medico as a potential candidate for the above listed study.  This Clinical Research Coordinator met with Gabrielle [name], Gabrielle [name] on 04/08/22 in a manner and location that ensures patient privacy to discuss participation in the above listed research study.  Patient is Unaccompanied.  Patient was previously provided with informed consent documents.  Patient confirmed they have read the informed consent documents.  As outlined in the informed consent form, this Coordinator and [name] discussed the purpose of the research study, the investigational nature of the study, study procedures and requirements for study participation, potential risks and benefits of study participation, as well as alternatives to participation.  This study is not blinded or double-blinded. The patient understands participation is voluntary and they may withdraw from study participation at any time.  This study does not involve randomization.  This study does not involve an investigational drug or device. This study does not involve a placebo. Patient understands enrollment is pending full eligibility review.   Confidentiality and how the patient's information will be used as part of study participation were discussed.  Patient was informed there is reimbursement provided for their time and effort spent on trial participation.  The patient is encouraged to discuss research study participation with their insurance provider to determine what  costs they may incur as part of study participation, including research related injury.    All questions were answered to patient's satisfaction.  The informed consent with embedded HIPAA language was reviewed page by page.  The patient's mental and emotional status is appropriate to provide informed consent, and the patient verbalizes an understanding of study participation.  Patient has agreed to participate in the above listed research study and has voluntarily signed the informed consent version dated 01 Dec 2020 with embedded HIPAA language, version dated 01 Dec 2020  on 04/08/22 at 9:59AM.  The patient was provided with a copy of the signed informed consent form with embedded HIPAA language for their reference.  No study specific procedures were obtained prior to the signing of the informed consent document.  Approximately 20 minutes were spent with the patient reviewing the informed consent documents.  Patient was not requested to complete a Release of Information form.   After completion of consent documents, patient was escorted to the lab. Patient was thanked for her time and participation in the above mentioned study.   MEDICAL HISTORY QUESTIONNAIRE:  Medical History:  High Blood Pressure  No Coronary Artery Disease No Lupus    No Rheumatoid Arthritis  No Diabetes   No      Lynch Syndrome  No  Is the patient currently taking a magnesium supplement?   No  Does the patient have a personal history of cancer (greater than 5 years ago)?  No  Does the patient have a family history of cancer in 1st or 2nd degree relatives? Yes If yes, Relationship(s) and Cancer type(s)? Mom: Breast; maternal aunt and great aunt: Breast; Paternal grandmother: Breast; paternal aunt: breast; grandfather and uncle: prostate  Does the patient have history of  alcohol consumption? Yes   If yes, current or former? Current Number of years? 2 Drinks per week? 1  Does the patient have history of cigarette, cigar,  pipe, or chewing tobacco use?  No   LAB + GIFT CARD:  Eligibility: Eligibility criteria reviewed with patient. This coordinator has reviewed this patient's inclusion and exclusion criteria and confirmed patient is eligible for study participation. Eligibility confirmed by treating investigator, who also agrees that patient should proceed with enrollment. Patient will continue with enrollment. Blood Collection: Research blood obtained by [facility] venipuncture per patient's preference. Patient tolerated well without any adverse events. Gift Card: $50 gift card given to patient for her participation in this study.     Carol Ada, RT(R)(T) Clinical Research Coordinator

## 2022-04-10 ENCOUNTER — Other Ambulatory Visit: Payer: Self-pay | Admitting: Hematology

## 2022-04-10 MED ORDER — ZOLPIDEM TARTRATE 5 MG PO TABS: 5.0000 mg | ORAL_TABLET | Freq: Every evening | ORAL | 0 refills | Status: DC | PRN

## 2022-04-16 ENCOUNTER — Encounter (HOSPITAL_BASED_OUTPATIENT_CLINIC_OR_DEPARTMENT_OTHER)
Admission: RE | Disposition: A | Discharge status: Home / Self Care | Payer: Self-pay | Source: Home / Self Care | Attending: General Surgery

## 2022-04-16 ENCOUNTER — Ambulatory Visit (HOSPITAL_BASED_OUTPATIENT_CLINIC_OR_DEPARTMENT_OTHER): Payer: Medicaid Other | Admitting: Anesthesiology

## 2022-04-16 ENCOUNTER — Ambulatory Visit (HOSPITAL_COMMUNITY): Payer: Medicaid Other

## 2022-04-16 ENCOUNTER — Ambulatory Visit (HOSPITAL_BASED_OUTPATIENT_CLINIC_OR_DEPARTMENT_OTHER)
Admission: RE | Admit: 2022-04-16 | Discharge: 2022-04-16 | Disposition: A | Discharge status: Home / Self Care | Payer: Medicaid Other | Attending: General Surgery | Admitting: General Surgery

## 2022-04-16 ENCOUNTER — Other Ambulatory Visit: Payer: Self-pay

## 2022-04-16 ENCOUNTER — Encounter (HOSPITAL_BASED_OUTPATIENT_CLINIC_OR_DEPARTMENT_OTHER): Payer: Self-pay | Admitting: General Surgery

## 2022-04-16 DIAGNOSIS — Z6841 Body Mass Index (BMI) 40.0 and over, adult: Secondary | ICD-10-CM | POA: Insufficient documentation

## 2022-04-16 DIAGNOSIS — Z171 Estrogen receptor negative status [ER-]: Secondary | ICD-10-CM | POA: Diagnosis not present

## 2022-04-16 DIAGNOSIS — C50412 Malignant neoplasm of upper-outer quadrant of left female breast: Secondary | ICD-10-CM | POA: Insufficient documentation

## 2022-04-16 DIAGNOSIS — C50912 Malignant neoplasm of unspecified site of left female breast: Secondary | ICD-10-CM | POA: Diagnosis not present

## 2022-04-16 HISTORY — PX: PORTACATH PLACEMENT: SHX2246

## 2022-04-16 HISTORY — PX: BREAST LUMPECTOMY WITH AXILLARY LYMPH NODE BIOPSY: SHX5593

## 2022-04-16 LAB — POCT PREGNANCY, URINE: Preg Test, Ur: NEGATIVE

## 2022-04-16 SURGERY — BREAST LUMPECTOMY WITH AXILLARY LYMPH NODE BIOPSY
Anesthesia: General | Site: Chest | Laterality: Right

## 2022-04-16 MED ORDER — 0.9 % SODIUM CHLORIDE (POUR BTL) OPTIME
TOPICAL | Status: DC | PRN
  Administered 2022-04-16: 120 mL

## 2022-04-16 MED ORDER — FENTANYL CITRATE (PF) 100 MCG/2ML IJ SOLN
INTRAMUSCULAR | Status: AC
  Filled 2022-04-16: qty 2

## 2022-04-16 MED ORDER — ACETAMINOPHEN 500 MG PO TABS
ORAL_TABLET | ORAL | Status: AC
  Filled 2022-04-16: qty 2

## 2022-04-16 MED ORDER — HEPARIN (PORCINE) IN NACL 1000-0.9 UT/500ML-% IV SOLN
INTRAVENOUS | Status: AC
  Filled 2022-04-16: qty 500

## 2022-04-16 MED ORDER — FENTANYL CITRATE (PF) 100 MCG/2ML IJ SOLN
25.0000 ug | INTRAMUSCULAR | Status: DC | PRN
  Administered 2022-04-16 (×2): 50 ug via INTRAVENOUS

## 2022-04-16 MED ORDER — PROPOFOL 10 MG/ML IV BOLUS
INTRAVENOUS | Status: DC | PRN
  Administered 2022-04-16: 100 mg via INTRAVENOUS
  Administered 2022-04-16: 200 mg via INTRAVENOUS

## 2022-04-16 MED ORDER — PHENYLEPHRINE HCL (PRESSORS) 10 MG/ML IV SOLN
INTRAVENOUS | Status: DC | PRN
  Administered 2022-04-16 (×6): 80 ug via INTRAVENOUS

## 2022-04-16 MED ORDER — KETOROLAC TROMETHAMINE 15 MG/ML IJ SOLN
INTRAMUSCULAR | Status: AC
Start: 2022-04-16 — End: ?
  Filled 2022-04-16: qty 1

## 2022-04-16 MED ORDER — MIDAZOLAM HCL 2 MG/2ML IJ SOLN
INTRAMUSCULAR | Status: AC
  Filled 2022-04-16: qty 2

## 2022-04-16 MED ORDER — DEXAMETHASONE SODIUM PHOSPHATE 4 MG/ML IJ SOLN
INTRAMUSCULAR | Status: DC | PRN
  Administered 2022-04-16: 10 mg via INTRAVENOUS

## 2022-04-16 MED ORDER — OXYCODONE HCL 5 MG PO TABS: 5.0000 mg | ORAL_TABLET | Freq: Four times a day (QID) | ORAL | 0 refills | Status: DC | PRN

## 2022-04-16 MED ORDER — DEXMEDETOMIDINE (PRECEDEX) IN NS 20 MCG/5ML (4 MCG/ML) IV SYRINGE
PREFILLED_SYRINGE | INTRAVENOUS | Status: DC | PRN
  Administered 2022-04-16 (×3): 4 ug via INTRAVENOUS
  Administered 2022-04-16: 8 ug via INTRAVENOUS

## 2022-04-16 MED ORDER — ACETAMINOPHEN 500 MG PO TABS
1000.0000 mg | ORAL_TABLET | ORAL | Status: AC
  Administered 2022-04-16: 1000 mg via ORAL

## 2022-04-16 MED ORDER — MAGTRACE LYMPHATIC TRACER
INTRAMUSCULAR | Status: DC | PRN
  Administered 2022-04-16: 2 mL via INTRAMUSCULAR

## 2022-04-16 MED ORDER — LIDOCAINE HCL (CARDIAC) PF 100 MG/5ML IV SOSY
PREFILLED_SYRINGE | INTRAVENOUS | Status: DC | PRN
  Administered 2022-04-16: 60 mg via INTRAVENOUS

## 2022-04-16 MED ORDER — HEPARIN SOD (PORK) LOCK FLUSH 100 UNIT/ML IV SOLN
INTRAVENOUS | Status: DC | PRN
  Administered 2022-04-16: 450 [IU] via INTRAVENOUS

## 2022-04-16 MED ORDER — CEFAZOLIN IN SODIUM CHLORIDE 3-0.9 GM/100ML-% IV SOLN
3.0000 g | INTRAVENOUS | Status: AC
  Administered 2022-04-16: 3 g via INTRAVENOUS

## 2022-04-16 MED ORDER — MIDAZOLAM HCL 2 MG/2ML IJ SOLN
2.0000 mg | Freq: Once | INTRAMUSCULAR | Status: AC
  Administered 2022-04-16: 2 mg via INTRAVENOUS

## 2022-04-16 MED ORDER — KETOROLAC TROMETHAMINE 15 MG/ML IJ SOLN
15.0000 mg | INTRAMUSCULAR | Status: AC
  Administered 2022-04-16: 15 mg via INTRAVENOUS

## 2022-04-16 MED ORDER — CEFAZOLIN IN SODIUM CHLORIDE 3-0.9 GM/100ML-% IV SOLN
INTRAVENOUS | Status: AC
Start: 2022-04-16 — End: ?
  Filled 2022-04-16: qty 100

## 2022-04-16 MED ORDER — ONDANSETRON HCL 4 MG/2ML IJ SOLN
INTRAMUSCULAR | Status: AC
  Filled 2022-04-16: qty 2

## 2022-04-16 MED ORDER — CHLORHEXIDINE GLUCONATE CLOTH 2 % EX PADS: 6.0000 | MEDICATED_PAD | Freq: Once | CUTANEOUS | Status: DC

## 2022-04-16 MED ORDER — FENTANYL CITRATE (PF) 100 MCG/2ML IJ SOLN
50.0000 ug | Freq: Once | INTRAMUSCULAR | Status: AC
  Administered 2022-04-16: 50 ug via INTRAVENOUS

## 2022-04-16 MED ORDER — BUPIVACAINE HCL (PF) 0.25 % IJ SOLN
INTRAMUSCULAR | Status: AC
  Filled 2022-04-16: qty 30

## 2022-04-16 MED ORDER — LIDOCAINE 2% (20 MG/ML) 5 ML SYRINGE
INTRAMUSCULAR | Status: AC
Start: 2022-04-16 — End: ?
  Filled 2022-04-16: qty 5

## 2022-04-16 MED ORDER — LACTATED RINGERS IV SOLN: INTRAVENOUS | Status: DC

## 2022-04-16 MED ORDER — ONDANSETRON HCL 4 MG/2ML IJ SOLN: 4.0000 mg | Freq: Four times a day (QID) | INTRAMUSCULAR | Status: DC | PRN

## 2022-04-16 MED ORDER — CHLORHEXIDINE GLUCONATE CLOTH 2 % EX PADS
6.0000 | MEDICATED_PAD | Freq: Once | CUTANEOUS | Status: DC
Start: 2022-04-16 — End: 2022-04-16

## 2022-04-16 MED ORDER — DEXAMETHASONE SODIUM PHOSPHATE 10 MG/ML IJ SOLN
INTRAMUSCULAR | Status: AC
  Filled 2022-04-16: qty 1

## 2022-04-16 MED ORDER — OXYCODONE HCL 5 MG PO TABS
5.0000 mg | ORAL_TABLET | Freq: Once | ORAL | Status: AC | PRN
  Administered 2022-04-16: 5 mg via ORAL

## 2022-04-16 MED ORDER — BUPIVACAINE HCL (PF) 0.25 % IJ SOLN
INTRAMUSCULAR | Status: DC | PRN
  Administered 2022-04-16: 6 mL

## 2022-04-16 MED ORDER — MIDAZOLAM HCL 5 MG/5ML IJ SOLN
INTRAMUSCULAR | Status: DC | PRN
  Administered 2022-04-16: 2 mg via INTRAVENOUS

## 2022-04-16 MED ORDER — OXYCODONE HCL 5 MG PO TABS
ORAL_TABLET | ORAL | Status: AC
  Filled 2022-04-16: qty 1

## 2022-04-16 MED ORDER — OXYCODONE HCL 5 MG/5ML PO SOLN: 5.0000 mg | Freq: Once | ORAL | Status: AC | PRN

## 2022-04-16 MED ORDER — FENTANYL CITRATE (PF) 100 MCG/2ML IJ SOLN
INTRAMUSCULAR | Status: DC | PRN
  Administered 2022-04-16 (×4): 50 ug via INTRAVENOUS

## 2022-04-16 MED ORDER — HEPARIN SOD (PORK) LOCK FLUSH 100 UNIT/ML IV SOLN
INTRAVENOUS | Status: AC
  Filled 2022-04-16: qty 5

## 2022-04-16 MED ORDER — BUPIVACAINE-EPINEPHRINE (PF) 0.5% -1:200000 IJ SOLN
INTRAMUSCULAR | Status: DC | PRN
  Administered 2022-04-16: 25 mL via PERINEURAL

## 2022-04-16 MED ORDER — EPHEDRINE SULFATE (PRESSORS) 50 MG/ML IJ SOLN
INTRAMUSCULAR | Status: DC | PRN
  Administered 2022-04-16: 10 mg via INTRAVENOUS

## 2022-04-16 MED ORDER — PROPOFOL 500 MG/50ML IV EMUL
INTRAVENOUS | Status: AC
  Filled 2022-04-16: qty 50

## 2022-04-16 SURGICAL SUPPLY — 80 items
ADH SKN CLS APL DERMABOND .7 (GAUZE/BANDAGES/DRESSINGS) ×2
APL PRP STRL LF DISP 70% ISPRP (MISCELLANEOUS) ×2
APL SKNCLS STERI-STRIP NONHPOA (GAUZE/BANDAGES/DRESSINGS) ×2
APPLIER CLIP 9.375 MED OPEN (MISCELLANEOUS)
APR CLP MED 9.3 20 MLT OPN (MISCELLANEOUS)
BAG DECANTER FOR FLEXI CONT (MISCELLANEOUS) ×3 IMPLANT
BENZOIN TINCTURE PRP APPL 2/3 (GAUZE/BANDAGES/DRESSINGS) ×3 IMPLANT
BINDER BREAST LRG (GAUZE/BANDAGES/DRESSINGS) IMPLANT
BINDER BREAST MEDIUM (GAUZE/BANDAGES/DRESSINGS) IMPLANT
BINDER BREAST XLRG (GAUZE/BANDAGES/DRESSINGS) IMPLANT
BINDER BREAST XXLRG (GAUZE/BANDAGES/DRESSINGS) IMPLANT
BLADE SURG 11 STRL SS (BLADE) ×3 IMPLANT
BLADE SURG 15 STRL LF DISP TIS (BLADE) ×2 IMPLANT
BLADE SURG 15 STRL SS (BLADE) ×3
BNDG COHESIVE 4X5 TAN ST LF (GAUZE/BANDAGES/DRESSINGS) IMPLANT
CANISTER SUCT 1200ML W/VALVE (MISCELLANEOUS) IMPLANT
CHLORAPREP W/TINT 26 (MISCELLANEOUS) ×3 IMPLANT
CLIP APPLIE 9.375 MED OPEN (MISCELLANEOUS) IMPLANT
CLIP TI WIDE RED SMALL 6 (CLIP) IMPLANT
COVER BACK TABLE 60X90IN (DRAPES) ×3 IMPLANT
COVER MAYO STAND STRL (DRAPES) ×3 IMPLANT
COVER PROBE 5X48 (MISCELLANEOUS)
COVER PROBE W GEL 5X96 (DRAPES) ×3 IMPLANT
DERMABOND ADVANCED (GAUZE/BANDAGES/DRESSINGS) ×1
DERMABOND ADVANCED .7 DNX12 (GAUZE/BANDAGES/DRESSINGS) ×2 IMPLANT
DRAIN CHANNEL 19F RND (DRAIN) IMPLANT
DRAPE C-ARM 42X72 X-RAY (DRAPES) ×3 IMPLANT
DRAPE LAPAROSCOPIC ABDOMINAL (DRAPES) ×4 IMPLANT
DRAPE U-SHAPE 76X120 STRL (DRAPES) IMPLANT
DRAPE UTILITY XL STRL (DRAPES) ×4 IMPLANT
DRSG TEGADERM 4X4.75 (GAUZE/BANDAGES/DRESSINGS) ×3 IMPLANT
ELECT COATED BLADE 2.86 ST (ELECTRODE) ×3 IMPLANT
ELECT REM PT RETURN 9FT ADLT (ELECTROSURGICAL) ×3
ELECTRODE REM PT RTRN 9FT ADLT (ELECTROSURGICAL) ×2 IMPLANT
EVACUATOR SILICONE 100CC (DRAIN) IMPLANT
GAUZE 4X4 16PLY ~~LOC~~+RFID DBL (SPONGE) ×3 IMPLANT
GAUZE SPONGE 4X4 12PLY STRL LF (GAUZE/BANDAGES/DRESSINGS) ×3 IMPLANT
GLOVE BIO SURGEON STRL SZ7 (GLOVE) ×3 IMPLANT
GLOVE BIOGEL PI IND STRL 7.5 (GLOVE) ×2 IMPLANT
GLOVE BIOGEL PI INDICATOR 7.5 (GLOVE) ×1
GOWN STRL REUS W/ TWL LRG LVL3 (GOWN DISPOSABLE) ×6 IMPLANT
GOWN STRL REUS W/TWL LRG LVL3 (GOWN DISPOSABLE) ×9
HEMOSTAT ARISTA ABSORB 3G PWDR (HEMOSTASIS) IMPLANT
IV KIT MINILOC 20X1 SAFETY (NEEDLE) IMPLANT
KIT CVR 48X5XPRB PLUP LF (MISCELLANEOUS) IMPLANT
KIT MARKER MARGIN INK (KITS) ×3 IMPLANT
KIT PORT POWER 8FR ISP CVUE (Port) ×1 IMPLANT
NDL HYPO 25X1 1.5 SAFETY (NEEDLE) ×4 IMPLANT
NDL SAFETY ECLIPSE 18X1.5 (NEEDLE) ×2 IMPLANT
NEEDLE HYPO 18GX1.5 SHARP (NEEDLE) ×3
NEEDLE HYPO 25X1 1.5 SAFETY (NEEDLE) ×6 IMPLANT
NS IRRIG 1000ML POUR BTL (IV SOLUTION) IMPLANT
PACK BASIN DAY SURGERY FS (CUSTOM PROCEDURE TRAY) ×3 IMPLANT
PENCIL SMOKE EVACUATOR (MISCELLANEOUS) ×3 IMPLANT
PIN SAFETY STERILE (MISCELLANEOUS) IMPLANT
RETRACTOR ONETRAX LX 90X20 (MISCELLANEOUS) IMPLANT
SLEEVE SCD COMPRESS KNEE MED (STOCKING) ×3 IMPLANT
SPIKE FLUID TRANSFER (MISCELLANEOUS) IMPLANT
SPONGE T-LAP 18X18 ~~LOC~~+RFID (SPONGE) IMPLANT
SPONGE T-LAP 4X18 ~~LOC~~+RFID (SPONGE) ×3 IMPLANT
STRIP CLOSURE SKIN 1/2X4 (GAUZE/BANDAGES/DRESSINGS) ×3 IMPLANT
SUT MNCRL AB 4-0 PS2 18 (SUTURE) ×4 IMPLANT
SUT MON AB 5-0 PS2 18 (SUTURE) IMPLANT
SUT PROLENE 2 0 SH DA (SUTURE) ×3 IMPLANT
SUT SILK 2 0 SH (SUTURE) ×2 IMPLANT
SUT SILK 2 0 TIES 17X18 (SUTURE)
SUT SILK 2-0 18XBRD TIE BLK (SUTURE) IMPLANT
SUT VIC AB 2-0 SH 27 (SUTURE) ×9
SUT VIC AB 2-0 SH 27XBRD (SUTURE) ×2 IMPLANT
SUT VIC AB 3-0 SH 27 (SUTURE) ×6
SUT VIC AB 3-0 SH 27X BRD (SUTURE) ×2 IMPLANT
SUT VIC AB 5-0 PS2 18 (SUTURE) IMPLANT
SUT VICRYL AB 3 0 TIES (SUTURE) IMPLANT
SYR 5ML LUER SLIP (SYRINGE) ×3 IMPLANT
SYR CONTROL 10ML LL (SYRINGE) ×6 IMPLANT
TOWEL GREEN STERILE FF (TOWEL DISPOSABLE) ×3 IMPLANT
TRACER MAGTRACE VIAL (MISCELLANEOUS) IMPLANT
TRAY FAXITRON CT DISP (TRAY / TRAY PROCEDURE) ×1 IMPLANT
TUBE CONNECTING 20X1/4 (TUBING) IMPLANT
YANKAUER SUCT BULB TIP NO VENT (SUCTIONS) IMPLANT

## 2022-04-16 NOTE — Anesthesia Postprocedure Evaluation (Signed)
Anesthesia Post Note  Patient: Gabrielle [name]  Procedure(s) Performed: LEFT BREAST LUMPECTOMY WITH LEFT AXILLARY SENTINEL LYMPH NODE BIOPSY (Left: Breast) INSERTION PORT-A-CATH (Right: Chest)     Patient location during evaluation: PACU Anesthesia Type: General Level of consciousness: awake and alert Pain management: pain level controlled Vital Signs Assessment: post-procedure vital signs reviewed and stable Respiratory status: spontaneous breathing, nonlabored ventilation, respiratory function stable and patient connected to nasal cannula oxygen Cardiovascular status: blood pressure returned to baseline and stable Postop Assessment: no apparent nausea or vomiting Anesthetic complications: no Comments: A tooth was noted to be broken postoperatively.  While in PACU, the patient recognized that part of her left upper tooth was missing.  This was discussed discussed with the patient as a possible risk associated with anesthesia and airway placement.  The broken tooth is missing about half of the native tooth along an almost perfect vertical midline division of the tooth.  There are also multiple other teeth missing from this left upper region that have been gone prior to today.   No notable events documented.  Last Vitals:  Vitals:   04/16/22 1045 04/16/22 1100  BP: 113/64 118/70  Pulse: 69 63  Resp: 10 16  Temp:  (!) 36.4 C  SpO2: 98% 98%    Last Pain:  Vitals:   04/16/22 1100  TempSrc:   PainSc: [place]

## 2022-04-16 NOTE — Op Note (Addendum)
Preoperative diagnosis: Clinical stage II left breast cancer Postoperative diagnosis: Same as above Procedure: 1.  Right internal jugular port placement 2.  Left breast lumpectomy 3.  Injection of mag trace for sentinel lymph node identification 4.  Left deep axillary sentinel lymph node biopsy Surgeon: Dr. Serita Grammes Anesthesia: General with a left pectoral block Specimens: 1.  Left breast tissue marked with paint 2.  Additional medial, anterior, inferior, superior, posterior margins marked short superior, long lateral, double deep 3.  Left deep axillary sentinel lymph nodes with highest count of 5188 Complications: None Drains: None Sponge needle count was correct at completion Disposition to recovery stable condition  Indications:31 y.o. female who noted a palpable left breast mass. Density is C. Has a fh of breast cancer in mom at age 29, pgm and multiple aunts. She has irregular mass on mm that on [place] measures 3.7x3.4x2.3 cm in size. Her ax [place] is negative. 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 MRI shows only a 4 cm biopsy proven breast cancer.  Staging scans negative.  We discussed proceeding with a lumpectomy and sentinel node biopsy with port.  We elected not to do neoadjuvant chemotherapy due to the nature of this metaplastic cancer  Procedure: After informed consent was obtained the patient was taken to the operative room.  She had first undergone a left pectoral block.  She was given antibiotics.  SCDs were in place.  She was placed under general anesthesia.  She was then prepped and draped for the port in the standard sterile surgical fashion.  Surgical timeout was then performed.  I first prepped the area around the left areola.  I then injected 2 cc of mag trace in the subareolar position for later sentinel lymph node identification. I then was able to identify the internal jugular vein on the right side with ultrasound.  I made a small nick in the skin.  I accessed the  vein under ultrasound.  I placed the wire.  This was confirmed to be in the correct position.  I then created a pocket underneath the clavicle.  I tunneled the line between the 2 sites.  I then placed the dilator using fluoroscopy.  The wire assembly was removed.  The line was placed through the sheath.  The sheath was then removed.  The line was pulled back to be in the distal vena cava.  She had some ectopy with the port placement and I left this shorter in the superior vena cava due to this ectopy.  This was in good position on fluoroscopy and ready for use.  I then attached the port and sutured this into place with a single 2-0 Prolene suture.  This aspirated blood easily and flushed.  I placed heparin in this.  I closed this with 3-0 Vicryl and 4 Monocryl.  Glue and Steri-Strip were applied.  We then reprepped and draped her for the lumpectomy and sentinel lymph node biopsy.  An additional timeout was performed.  The mass was palpable.  This was a fairly large mass.  I elected to make an incision overlying it to get clear margins.  I then remove the mass and the surrounding tissue with an attempt to get a clear margin.  This was marked with paint.  The mass was large and by both palpation as well as 3D imaging I thought I might be close so I removed all the additional margins.  I also removed some posterior margin all the way down to the muscle.  I placed clips in the cavity.  I then closed the tissue down with 2-0 Vicryl.  The skin was closed with 3-0 Vicryl and 4-0 Monocryl.  Glue and Steri-Strips were applied.  I then made an incision in her low axilla.  I carried this through the axillary fascia.  I was able to identify what appeared to be a couple of nodes with activity.  I remove these.  There is no more activity once I remove these.  I then obtained hemostasis.  I closed this with 2-0 Vicryl, 3-0 Vicryl, and 4 Monocryl.  Glue and Steri-Strips were applied.  She tolerated this well was extubated and  transferred to recovery stable.

## 2022-04-16 NOTE — Transfer of Care (Signed)
Immediate Anesthesia Transfer of Care Note  Patient: Gabrielle [name]  Procedure(s) Performed: LEFT BREAST LUMPECTOMY WITH LEFT AXILLARY SENTINEL LYMPH NODE BIOPSY (Left: Breast) INSERTION PORT-A-CATH (Right: Chest)  Patient Location: PACU  Anesthesia Type:GA combined with regional for post-op pain  Level of Consciousness: awake, alert  and oriented  Airway & Oxygen Therapy: Patient Spontanous Breathing and Patient connected to face mask oxygen  Post-op Assessment: Report given to RN and Post -op Vital signs reviewed and stable  Post vital signs: Reviewed and stable  Last Vitals:  Vitals Value Taken Time  BP    Temp    Pulse 66 04/16/22 0944  Resp 20 04/16/22 0944  SpO2 100 % 04/16/22 0944  Vitals shown include unvalidated device data.  Last Pain:  Vitals:   04/16/22 0648  TempSrc: [place]  PainSc: 0-No pain         Complications: No notable events documented.

## 2022-04-16 NOTE — Anesthesia Procedure Notes (Signed)
Procedure Name: LMA Insertion Date/Time: 04/16/2022 7:38 AM Performed by: Maryella Shivers, CRNA Pre-anesthesia Checklist: Patient identified, Emergency Drugs available, Suction available and Patient being monitored Patient Re-evaluated:Patient Re-evaluated prior to induction Oxygen Delivery Method: Circle system utilized Preoxygenation: Pre-oxygenation with 100% oxygen Induction Type: IV induction Ventilation: Mask ventilation without difficulty LMA: LMA inserted LMA Size: 4.0 Number of attempts: 1 Airway Equipment and Method: Bite block Placement Confirmation: positive ETCO2 Tube secured with: Tape Dental Injury: Teeth and Oropharynx as per pre-operative assessment

## 2022-04-16 NOTE — Interval H&P Note (Signed)
History and Physical Interval Note:  04/16/2022 7:02 AM  Gabrielle [name]  has presented today for surgery, with the diagnosis of LEFT BREAST CANCER.  The various methods of treatment have been discussed with the patient and family. After consideration of risks, benefits and other options for treatment, the patient has consented to  Procedure(s): LEFT BREAST LUMPECTOMY WITH LEFT AXILLARY SENTINEL LYMPH NODE BIOPSY (Left) INSERTION PORT-A-CATH (N/A) as a surgical intervention.  The patient's history has been reviewed, patient examined, no change in status, stable for surgery.  I have reviewed the patient's chart and labs.  Questions were answered to the patient's satisfaction.     Rolm Bookbinder

## 2022-04-16 NOTE — H&P (Signed)
31 y.o. female who noted a palpable left breast mass. Density is C. Has a fh of breast cancer in mom at age 45, pgm and multiple aunts. She has irregular mass on mm that on [place] measures 3.7x3.4x2.3 cm in size. Her ax [place] is negative. 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 MRI shows only a 4 cm biopsy proven breast cancer.  Staging scans negative.  I know her mother from taking care of her for her breast cancer.   Review of Systems: A complete review of systems was obtained from the patient. I have reviewed this information and discussed as appropriate with the patient. See HPI as well for other ROS.  Review of Systems  All other systems reviewed and are negative.   Medical History: Past Medical History:  Diagnosis Date   History of cancer   Hyperlipidemia   Patient Active Problem List  Diagnosis   Malignant neoplasm of upper-outer quadrant of left breast in female, estrogen receptor negative (CMS-HCC)   Past Surgical History:  Procedure Laterality Date   .Breast Biopsy Left 03/11/2022    No Known Allergies  Current Outpatient Medications on File Prior to Visit  Medication Sig Dispense Refill   cetirizine (ZYRTEC) 10 mg capsule Take by mouth   dicyclomine (BENTYL) 20 mg tablet Take 20 mg by mouth 2 (two) times daily   fluticasone propionate (FLONASE) 50 mcg/actuation nasal spray Place into one nostril   hydrOXYzine (ATARAX) 25 MG tablet Take by mouth   ibuprofen (MOTRIN) 800 MG tablet Take 800 mg by mouth 3 (three) times daily    Family History  Problem Relation Age of Onset   Breast cancer Mother   Diabetes Brother    Social History   Tobacco Use  Smoking Status Never  Smokeless Tobacco Never    Social History   Socioeconomic History   Marital status: Unknown  Tobacco Use   Smoking status: Never   Smokeless tobacco: Never  Vaping Use   Vaping Use: Never used  Substance and Sexual Activity   Alcohol use: Yes   Objective:  There were no vitals filed  for this visit.  There is no height or weight on file to calculate BMI.  Physical Exam Constitutional:  Appearance: Normal appearance. She is obese.  Chest:  Breasts: Right: No inverted nipple, mass or nipple discharge.  Left: Mass present. No inverted nipple or nipple discharge.  Comments: 4 cm upper central left breast mass, mobile nontender Lymphadenopathy:  Upper Body:  Right upper body: No supraclavicular or axillary adenopathy.  Left upper body: No supraclavicular or axillary adenopathy.  Neurological:  Mental Status: She is alert.   Assessment and Plan:   Malignant neoplasm of upper-outer quadrant of left breast in female, estrogen receptor negative (CMS-HCC)  Left breast seed guided lumpectomy, left axillary sn biopsy, port placement  We discussed the staging and pathophysiology of breast cancer. We discussed all of the different options for treatment for breast cancer including surgery, chemotherapy, radiation therapy, Herceptin, and antiestrogen therapy. With metaplastic nature dont think primary systemic therapy is best option given likely lack of response or even progression. I feel like surgery first is best plan for her. Discussed more aggressive nature of this tumor.  We discussed a sentinel lymph node biopsy as she does not appear to having lymph node involvement right now. We discussed the performance of that with injection of tracer. We discussed that there is a chance of having a positive node with a sentinel lymph node biopsy  and we will await the permanent pathology to make any other first further decisions in terms of her treatment. We discussed up to a 5% risk lifetime of chronic shoulder pain as well as lymphedema associated with a sentinel lymph node biopsy. We discussed the options for treatment of the breast cancer which included lumpectomy versus a mastectomy. We discussed the performance of the lumpectomy with radioactive seed placement. We discussed a 5-10%  chance of a positive margin requiring reexcision in the operating room. We also discussed that she will likely need radiation therapy if she undergoes lumpectomy. We discussed mastectomy and the postoperative care for that as well. Mastectomy can be followed by reconstruction. The decision for lumpectomy vs mastectomy has no impact on decision for chemotherapy. Most mastectomy patients will not need radiation therapy. We discussed that there is no difference in her survival whether she undergoes lumpectomy with radiation therapy or antiestrogen therapy versus a mastectomy. There is also no real difference between her recurrence in the breast. We discussed the risks of operation including bleeding, infection, possible reoperation. She understands her further therapy will be based on what her stages at the time of her operation.

## 2022-04-16 NOTE — Anesthesia Preprocedure Evaluation (Signed)
Anesthesia Evaluation  Patient identified by MRN, date of birth, ID band Patient awake    Reviewed: Allergy & Precautions, H&P , NPO status , Patient's Chart, lab work & pertinent test results  Airway Mallampati: II   Neck ROM: full    Dental   Pulmonary Current Smoker,    breath sounds clear to auscultation       Cardiovascular negative cardio ROS   Rhythm:regular Rate:Normal     Neuro/Psych    GI/Hepatic   Endo/Other  Morbid obesity  Renal/GU      Musculoskeletal   Abdominal   Peds  Hematology   Anesthesia Other Findings   Reproductive/Obstetrics Breast CA                             Anesthesia Physical Anesthesia Plan  ASA: 2  Anesthesia Plan: General   Post-op Pain Management: Regional block*   Induction: Intravenous  PONV Risk Score and Plan: 2 and Ondansetron, Dexamethasone, Midazolam and Treatment may vary due to age or medical condition  Airway Management Planned: LMA  Additional Equipment:   Intra-op Plan:   Post-operative Plan: Extubation in OR  Informed Consent: I have reviewed the patients History and Physical, chart, labs and discussed the procedure including the risks, benefits and alternatives for the proposed anesthesia with the patient or authorized representative who has indicated his/her understanding and acceptance.     Dental advisory given  Plan Discussed with: CRNA, Anesthesiologist and Surgeon  Anesthesia Plan Comments:         Anesthesia Quick Evaluation

## 2022-04-16 NOTE — Progress Notes (Signed)
Assisted Dr. Hodierne with left, pectoralis, ultrasound guided block. Side rails up, monitors on throughout procedure. See vital signs in flow sheet. Tolerated Procedure well. 

## 2022-04-16 NOTE — Anesthesia Procedure Notes (Signed)
Anesthesia Regional Block: Pectoralis block   Pre-Anesthetic Checklist: , timeout performed,  Correct Patient, Correct Site, Correct Laterality,  Correct Procedure, Correct Position, site marked,  Risks and benefits discussed,  Surgical consent,  Pre-op evaluation,  At surgeon's request and post-op pain management  Laterality: Left  Prep: chloraprep       Needles:  Injection technique: Single-shot  Needle Type: Echogenic Needle     Needle Length: 9cm  Needle Gauge: 21     Additional Needles:   Narrative:  Start time: 04/16/2022 7:10 AM End time: 04/16/2022 7:18 AM Injection made incrementally with aspirations every 5 mL.  Performed by: Personally  Anesthesiologist: Albertha Ghee, MD  Additional Notes: Pt tolerated the procedure well.

## 2022-04-16 NOTE — Discharge Instructions (Addendum)
[facility] Office Phone Number [phone]  POST OP INSTRUCTIONS Take 400 mg of ibuprofen every 8 hours or 650 mg tylenol every 6 hours for next 72 hours then as needed. Use ice several times daily also.   A prescription for pain medication may be given to you upon discharge.  Take your pain medication as prescribed, if needed.  If narcotic pain medicine is not needed, then you may take acetaminophen (Tylenol), naprosyn (Alleve) or ibuprofen (Advil) as needed. Take your usually prescribed medications unless otherwise directed If you need a refill on your pain medication, please contact your pharmacy.  They will contact our office to request authorization.  Prescriptions will not be filled after 5pm or on week-ends. You should eat very light the first 24 hours after surgery, such as soup, crackers, pudding, etc.  Resume your normal diet the day after surgery. Most patients will experience some swelling and bruising in the breast.  Ice packs and a good support bra will help.  Wear the breast binder provided or a sports bra for 72 hours day and night.  After that wear a sports bra during the day until you return to the office. Swelling and bruising can take several days to resolve.  It is common to experience some constipation if taking pain medication after surgery.  Increasing fluid intake and taking a stool softener will usually help or prevent this problem from occurring.  A mild laxative (Milk of Magnesia or Miralax) should be taken according to package directions if there are no bowel movements after 48 hours. I used skin glue on the incision, you may shower in 24 hours.  The glue will flake off over the next 2-3 weeks.  Any sutures or staples will be removed at the office during your follow-up visit. ACTIVITIES:  You may resume regular daily activities (gradually increasing) beginning the next day.  Wearing a good support bra or sports bra minimizes pain and swelling.  You may  have sexual intercourse when it is comfortable. You may drive when you no longer are taking prescription pain medication, you can comfortably wear a seatbelt, and you can safely maneuver your car and apply brakes. RETURN TO WORK:  ______________________________________________________________________________________ Dennis Bast should see your doctor in the office for a follow-up appointment approximately two weeks after your surgery.  Your doctor's nurse will typically make your follow-up appointment when she calls you with your pathology report.  Expect your pathology report 3-4 business days after your surgery.  You may call to check if you do not hear from [place] after three days. OTHER INSTRUCTIONS: _______________________________________________________________________________________________ _____________________________________________________________________________________________________________________________________ _____________________________________________________________________________________________________________________________________ _____________________________________________________________________________________________________________________________________  WHEN TO CALL DR WAKEFIELD: Fever over 101.0 Nausea and/or vomiting. Extreme swelling or bruising. Continued bleeding from incision. Increased pain, redness, or drainage from the incision.  The clinic staff is available to answer your questions during regular business hours.  Please don't hesitate to call and ask to speak to one of the nurses for clinical concerns.  If you have a medical emergency, go to the nearest emergency room or call 911.  A surgeon from [facility] is always on call at the hospital.  For further questions, please visit [email or web address]  Not Tylenol/Ibuprofen until 1pm   Post Anesthesia Home Care Instructions  Activity: Get plenty of rest for the remainder of the  day. A responsible individual must stay with you for 24 hours following the procedure.  For the next 24 hours, DO NOT: -Drive a car -Paediatric nurse -Drink alcoholic beverages -  Take any medication unless instructed by your physician -Make any legal decisions or sign important papers.  Meals: Start with liquid foods such as gelatin or soup. Progress to regular foods as tolerated. Avoid greasy, spicy, heavy foods. If nausea and/or vomiting occur, drink only clear liquids until the nausea and/or vomiting subsides. Call your physician if vomiting continues.  Special Instructions/Symptoms: Your throat may feel dry or sore from the anesthesia or the breathing tube placed in your throat during surgery. If this causes discomfort, gargle with warm salt water. The discomfort should disappear within 24 hours.  If you had a scopolamine patch placed behind your ear for the management of post- operative nausea and/or vomiting:  1. The medication in the patch is effective for 72 hours, after which it should be removed.  Wrap patch in a tissue and discard in the trash. Wash hands thoroughly with soap and water. 2. You may remove the patch earlier than 72 hours if you experience unpleasant side effects which may include dry mouth, dizziness or visual disturbances. 3. Avoid touching the patch. Wash your hands with soap and water after contact with the patch.

## 2022-04-17 ENCOUNTER — Encounter (HOSPITAL_BASED_OUTPATIENT_CLINIC_OR_DEPARTMENT_OTHER): Payer: Self-pay | Admitting: General Surgery

## 2022-04-17 NOTE — Addendum Note (Signed)
Addendum  created 04/17/22 0850 by Maryella Shivers, CRNA   Clinical Note Signed

## 2022-04-18 LAB — SURGICAL PATHOLOGY

## 2022-04-22 ENCOUNTER — Encounter: Payer: Self-pay | Admitting: *Deleted

## 2022-04-25 ENCOUNTER — Other Ambulatory Visit: Payer: Self-pay | Admitting: General Surgery

## 2022-04-26 ENCOUNTER — Encounter: Payer: Self-pay | Admitting: Licensed Clinical Social Worker

## 2022-04-26 DIAGNOSIS — Z171 Estrogen receptor negative status [ER-]: Secondary | ICD-10-CM

## 2022-04-29 ENCOUNTER — Encounter: Payer: Self-pay | Admitting: *Deleted

## 2022-05-01 ENCOUNTER — Telehealth: Payer: Self-pay | Admitting: Hematology

## 2022-05-01 NOTE — Telephone Encounter (Signed)
.  Called patient to schedule appointment per 6/13 inbasket, patient is aware of date and time.   

## 2022-05-06 ENCOUNTER — Encounter: Payer: Self-pay | Admitting: Rehabilitation

## 2022-05-08 ENCOUNTER — Inpatient Hospital Stay: Payer: Medicaid Other | Admitting: Hematology

## 2022-05-08 ENCOUNTER — Inpatient Hospital Stay: Payer: Medicaid Other

## 2022-05-08 NOTE — Pre-Procedure Instructions (Signed)
Surgical Instructions    Your procedure is scheduled on Tuesday, June 27.  Report to [facility] Main Entrance "A" at 7:00 A.M., then check in with the Admitting office.  Call this number if you have problems the morning of surgery:  [phone]   If you have any questions prior to your surgery date call [phone]: Open Monday-Friday 8am-4pm    Remember:  Do not eat after midnight the night before your surgery  You may drink clear liquids until 6:00 the morning of your surgery.   Clear liquids allowed are: Water, Non-Citrus Juices (without pulp), Carbonated Beverages, Clear Tea, Black Coffee ONLY (NO MILK, CREAM OR POWDERED CREAMER of any kind), and Gatorade  Patient Instructions  The night before surgery:  No food after midnight. ONLY clear liquids after midnight  The day of surgery (if you do NOT have diabetes):  Drink ONE (1) Pre-Surgery Clear Ensure by 6:00AM the morning of surgery. Drink in one sitting. Do not sip.  This drink was given to you during your hospital  pre-op appointment visit.  Nothing else to drink after completing the  Pre-Surgery Clear Ensure.           If you have questions, please contact your surgeon's office.     Take these medicines the morning of surgery with A SIP OF WATER:  acetaminophen (TYLENOL)  if needed traMADol (ULTRAM) 50  if needed   As of today, STOP taking any Aspirin (unless otherwise instructed by your surgeon) Aleve, Naproxen, Ibuprofen, Motrin, Advil, Goody's, BC's, all herbal medications, fish oil, and all vitamins.  [facility] is not responsible for any belongings or valuables.   Do NOT Smoke (Tobacco/Vaping)  24 hours prior to your procedure  If you use a CPAP at night, you may bring your mask for your overnight stay.   Contacts, glasses, hearing aids, dentures or partials may not be worn into surgery, please bring cases for these belongings   For patients admitted to the hospital, discharge time will be determined  by your treatment team.   Patients discharged the day of surgery will not be allowed to drive home, and someone needs to stay with them for 24 hours.   SURGICAL WAITING ROOM VISITATION Patients having surgery or a procedure in a hospital may have two support people. Children under the age of 84 must have an adult with them who is not the patient. They may stay in the waiting area during the procedure and may switch out with other visitors. If the patient needs to stay at the hospital during part of their recovery, the visitor guidelines for inpatient rooms apply.  Please refer to the [facility] website for the visitor guidelines for Inpatients (after your surgery is over and you are in a regular room).       Special instructions:    Oral Hygiene is also important to reduce your risk of infection.  Remember - BRUSH YOUR TEETH THE MORNING OF SURGERY WITH YOUR REGULAR TOOTHPASTE   [facility]- Preparing For Surgery  Before surgery, you can play an important role. Because skin is not sterile, your skin needs to be as free of germs as possible. You can reduce the number of germs on your skin by washing with CHG (chlorahexidine gluconate) Soap before surgery.  CHG is an antiseptic cleaner which kills germs and bonds with the skin to continue killing germs even after washing.     Please do not use if you have an allergy to CHG or  antibacterial soaps. If your skin becomes reddened/irritated stop using the CHG.  Do not shave (including legs and underarms) for at least 48 hours prior to first CHG shower. It is OK to shave your face.  Please follow these instructions carefully.     Shower the NIGHT BEFORE SURGERY and the MORNING OF SURGERY with CHG Soap.   If you chose to wash your hair, wash your hair first as usual with your normal shampoo. After you shampoo, rinse your hair and body thoroughly to remove the shampoo.  Then [employer] and genitals (private parts) with your normal soap and rinse  thoroughly to remove soap.  After that Use CHG Soap as you would any other liquid soap. You can apply CHG directly to the skin and wash gently with a scrungie or a clean washcloth.   Apply the CHG Soap to your body ONLY FROM THE NECK DOWN.  Do not use on open wounds or open sores. Avoid contact with your eyes, ears, mouth and genitals (private parts). Wash Face and genitals (private parts)  with your normal soap.   Wash thoroughly, paying special attention to the area where your surgery will be performed.  Thoroughly rinse your body with warm water from the neck down.  DO NOT shower/wash with your normal soap after using and rinsing off the CHG Soap.  Pat yourself dry with a CLEAN TOWEL.  Wear CLEAN PAJAMAS to bed the night before surgery  Place CLEAN SHEETS on your bed the night before your surgery  DO NOT SLEEP WITH PETS.   Day of Surgery:  Take a shower with CHG soap. Wear Clean/Comfortable clothing the morning of surgery Do not apply any deodorants/lotions.   Remember to brush your teeth WITH YOUR REGULAR TOOTHPASTE. Do not wear jewelry or makeup Do not wear lotions, powders, perfumes/colognes, or deodorant. Do not shave 48 hours prior to surgery.  Men may shave face and neck. Do not bring valuables to the hospital. Do not wear nail polish, gel polish, artificial nails, or any other type of covering on natural nails (fingers and toes) If you have artificial nails or gel coating that need to be removed by a nail salon, please have this removed prior to surgery. Artificial nails or gel coating may interfere with anesthesia's ability to adequately monitor your vital signs.   If you received a COVID test during your pre-op visit, it is requested that you wear a mask when out in public, stay away from anyone that may not be feeling well, and notify your surgeon if you develop symptoms. If you have been in contact with anyone that has tested positive in the last 10 days, please  notify your surgeon.    Please read over the following fact sheets that you were given.

## 2022-05-09 ENCOUNTER — Encounter (HOSPITAL_COMMUNITY): Payer: Self-pay

## 2022-05-09 ENCOUNTER — Encounter (HOSPITAL_COMMUNITY)
Admission: RE | Admit: 2022-05-09 | Discharge: 2022-05-09 | Disposition: A | Discharge status: Home / Self Care | Payer: Medicaid Other | Source: Ambulatory Visit | Attending: General Surgery | Admitting: General Surgery

## 2022-05-09 ENCOUNTER — Other Ambulatory Visit: Payer: Self-pay

## 2022-05-09 VITALS — BP 110/75 | HR 61 | Temp 98.3°F | Resp 18 | Ht 70.0 in | Wt 279.3 lb

## 2022-05-09 DIAGNOSIS — Z01812 Encounter for preprocedural laboratory examination: Secondary | ICD-10-CM | POA: Diagnosis present

## 2022-05-09 DIAGNOSIS — Z01818 Encounter for other preprocedural examination: Secondary | ICD-10-CM

## 2022-05-09 HISTORY — DX: Unspecified osteoarthritis, unspecified site: M19.90

## 2022-05-09 LAB — CBC
HCT: 38.8 % (ref 36.0–46.0)
Hemoglobin: 12.2 g/dL (ref 12.0–15.0)
MCH: 26.5 pg (ref 26.0–34.0)
MCHC: 31.4 g/dL (ref 30.0–36.0)
MCV: 84.2 fL (ref 80.0–100.0)
Platelets: 312 10*3/uL (ref 150–400)
RBC: 4.61 MIL/uL (ref 3.87–5.11)
RDW: 13.5 % (ref 11.5–15.5)
WBC: 10.3 10*3/uL (ref 4.0–10.5)
nRBC: 0 % (ref 0.0–0.2)

## 2022-05-09 NOTE — Progress Notes (Signed)
PCP - Raelyn Number, NP Cardiologist - Denies  PPM/ICD - Denies Device Orders - n/a Rep Notified - n/a  Chest x-ray - denies EKG - 10-27-2019 Stress Test - denies ECHO - 03/25/2022 Cardiac Cath - denies  Sleep Study - Denies CPAP - n/a  No DM  Blood Thinner Instructions: n/a Aspirin Instructions: n/a  ERAS Protcol - Yes. Clear liquids until 0600 morning of surgery PRE-SURGERY Ensure or G2- Ensure given to patient at PAT visit  COVID TEST- n/a   Anesthesia review: No   Patient denies shortness of breath, fever, cough and chest pain at PAT appointment   All instructions explained to the patient, with a verbal understanding of the material. Patient agrees to go over the instructions while at home for a better understanding. Patient also instructed to self quarantine after being tested for COVID-19. The opportunity to ask questions was provided.

## 2022-05-13 ENCOUNTER — Ambulatory Visit: Payer: Medicaid Other | Admitting: Hematology

## 2022-05-14 ENCOUNTER — Encounter (HOSPITAL_COMMUNITY)
Admission: RE | Disposition: A | Discharge status: Home / Self Care | Payer: Self-pay | Source: Home / Self Care | Attending: General Surgery

## 2022-05-14 ENCOUNTER — Other Ambulatory Visit: Payer: Self-pay

## 2022-05-14 ENCOUNTER — Ambulatory Visit (HOSPITAL_COMMUNITY): Payer: Medicaid Other | Admitting: Certified Registered"

## 2022-05-14 ENCOUNTER — Encounter (HOSPITAL_COMMUNITY): Payer: Self-pay | Admitting: General Surgery

## 2022-05-14 ENCOUNTER — Ambulatory Visit (HOSPITAL_COMMUNITY)
Admission: RE | Admit: 2022-05-14 | Discharge: 2022-05-14 | Disposition: A | Discharge status: Home / Self Care | Payer: Medicaid Other | Attending: General Surgery | Admitting: General Surgery

## 2022-05-14 ENCOUNTER — Ambulatory Visit (HOSPITAL_BASED_OUTPATIENT_CLINIC_OR_DEPARTMENT_OTHER): Payer: Medicaid Other | Admitting: Certified Registered"

## 2022-05-14 DIAGNOSIS — Z87891 Personal history of nicotine dependence: Secondary | ICD-10-CM | POA: Insufficient documentation

## 2022-05-14 DIAGNOSIS — Z803 Family history of malignant neoplasm of breast: Secondary | ICD-10-CM | POA: Diagnosis not present

## 2022-05-14 DIAGNOSIS — C50412 Malignant neoplasm of upper-outer quadrant of left female breast: Secondary | ICD-10-CM | POA: Insufficient documentation

## 2022-05-14 DIAGNOSIS — C50912 Malignant neoplasm of unspecified site of left female breast: Secondary | ICD-10-CM | POA: Diagnosis not present

## 2022-05-14 DIAGNOSIS — Z6839 Body mass index (BMI) 39.0-39.9, adult: Secondary | ICD-10-CM | POA: Insufficient documentation

## 2022-05-14 DIAGNOSIS — Z171 Estrogen receptor negative status [ER-]: Secondary | ICD-10-CM | POA: Diagnosis not present

## 2022-05-14 HISTORY — PX: AXILLARY SENTINEL NODE BIOPSY: SHX5738

## 2022-05-14 LAB — POCT PREGNANCY, URINE: Preg Test, Ur: NEGATIVE

## 2022-05-14 SURGERY — BIOPSY, LYMPH NODE, SENTINEL, AXILLARY
Anesthesia: General | Site: Breast | Laterality: Left

## 2022-05-14 MED ORDER — ONDANSETRON HCL 4 MG/2ML IJ SOLN
INTRAMUSCULAR | Status: DC | PRN
  Administered 2022-05-14: 4 mg via INTRAVENOUS

## 2022-05-14 MED ORDER — OXYCODONE HCL 5 MG/5ML PO SOLN: 5.0000 mg | Freq: Once | ORAL | Status: DC | PRN

## 2022-05-14 MED ORDER — ACETAMINOPHEN 500 MG PO TABS: 1000.0000 mg | ORAL_TABLET | ORAL | Status: DC

## 2022-05-14 MED ORDER — 0.9 % SODIUM CHLORIDE (POUR BTL) OPTIME
TOPICAL | Status: DC | PRN
  Administered 2022-05-14: 1000 mL

## 2022-05-14 MED ORDER — ONDANSETRON HCL 4 MG/2ML IJ SOLN: 4.0000 mg | Freq: Once | INTRAMUSCULAR | Status: DC | PRN

## 2022-05-14 MED ORDER — MEPERIDINE HCL 25 MG/ML IJ SOLN: 6.2500 mg | INTRAMUSCULAR | Status: DC | PRN

## 2022-05-14 MED ORDER — HEMOSTATIC AGENTS (NO CHARGE) OPTIME
TOPICAL | Status: DC | PRN
  Administered 2022-05-14: 1 via TOPICAL

## 2022-05-14 MED ORDER — ENSURE PRE-SURGERY PO LIQD: 296.0000 mL | Freq: Once | ORAL | Status: DC

## 2022-05-14 MED ORDER — SODIUM CHLORIDE (PF) 0.9 % IJ SOLN
INTRAVENOUS | Status: DC | PRN
  Administered 2022-05-14: 5 mL

## 2022-05-14 MED ORDER — BUPIVACAINE-EPINEPHRINE 0.25% -1:200000 IJ SOLN
INTRAMUSCULAR | Status: DC | PRN
  Administered 2022-05-14: 4 mL

## 2022-05-14 MED ORDER — PROPOFOL 10 MG/ML IV BOLUS
INTRAVENOUS | Status: DC | PRN
  Administered 2022-05-14: 100 mg via INTRAVENOUS
  Administered 2022-05-14: 200 mg via INTRAVENOUS

## 2022-05-14 MED ORDER — FENTANYL CITRATE (PF) 100 MCG/2ML IJ SOLN
INTRAMUSCULAR | Status: AC
  Administered 2022-05-14: 100 ug via INTRAVENOUS
  Filled 2022-05-14: qty 2

## 2022-05-14 MED ORDER — FENTANYL CITRATE (PF) 250 MCG/5ML IJ SOLN
INTRAMUSCULAR | Status: AC
  Filled 2022-05-14: qty 5

## 2022-05-14 MED ORDER — BUPIVACAINE-EPINEPHRINE (PF) 0.25% -1:200000 IJ SOLN
INTRAMUSCULAR | Status: DC | PRN
  Administered 2022-05-14: 30 mL via PERINEURAL

## 2022-05-14 MED ORDER — MIDAZOLAM HCL 2 MG/2ML IJ SOLN
INTRAMUSCULAR | Status: AC
  Filled 2022-05-14: qty 2

## 2022-05-14 MED ORDER — CHLORHEXIDINE GLUCONATE CLOTH 2 % EX PADS: 6.0000 | MEDICATED_PAD | Freq: Once | CUTANEOUS | Status: DC

## 2022-05-14 MED ORDER — LIDOCAINE 2% (20 MG/ML) 5 ML SYRINGE
INTRAMUSCULAR | Status: AC
  Filled 2022-05-14: qty 5

## 2022-05-14 MED ORDER — ACETAMINOPHEN 160 MG/5ML PO SOLN: 325.0000 mg | ORAL | Status: DC | PRN

## 2022-05-14 MED ORDER — BUPIVACAINE-EPINEPHRINE (PF) 0.25% -1:200000 IJ SOLN
INTRAMUSCULAR | Status: AC
  Filled 2022-05-14: qty 30

## 2022-05-14 MED ORDER — FENTANYL CITRATE (PF) 100 MCG/2ML IJ SOLN
INTRAMUSCULAR | Status: DC | PRN
  Administered 2022-05-14: 50 ug via INTRAVENOUS

## 2022-05-14 MED ORDER — FENTANYL CITRATE (PF) 100 MCG/2ML IJ SOLN
25.0000 ug | INTRAMUSCULAR | Status: DC | PRN
  Administered 2022-05-14: 50 ug via INTRAVENOUS

## 2022-05-14 MED ORDER — SODIUM CHLORIDE (PF) 0.9 % IJ SOLN
INTRAMUSCULAR | Status: AC
  Filled 2022-05-14: qty 10

## 2022-05-14 MED ORDER — FENTANYL CITRATE (PF) 100 MCG/2ML IJ SOLN: 100.0000 ug | Freq: Once | INTRAMUSCULAR | Status: AC

## 2022-05-14 MED ORDER — MIDAZOLAM HCL 2 MG/2ML IJ SOLN: 2.0000 mg | Freq: Once | INTRAMUSCULAR | Status: AC

## 2022-05-14 MED ORDER — EPHEDRINE SULFATE (PRESSORS) 50 MG/ML IJ SOLN
INTRAMUSCULAR | Status: DC | PRN
  Administered 2022-05-14 (×3): 5 mg via INTRAVENOUS

## 2022-05-14 MED ORDER — ONDANSETRON HCL 4 MG/2ML IJ SOLN
INTRAMUSCULAR | Status: AC
  Filled 2022-05-14: qty 2

## 2022-05-14 MED ORDER — OXYCODONE HCL 5 MG PO TABS: 5.0000 mg | ORAL_TABLET | Freq: Four times a day (QID) | ORAL | 0 refills | Status: DC | PRN

## 2022-05-14 MED ORDER — ACETAMINOPHEN 325 MG PO TABS: 325.0000 mg | ORAL_TABLET | ORAL | Status: DC | PRN

## 2022-05-14 MED ORDER — MIDAZOLAM HCL 2 MG/2ML IJ SOLN
INTRAMUSCULAR | Status: AC
  Administered 2022-05-14: 2 mg via INTRAVENOUS
  Filled 2022-05-14: qty 2

## 2022-05-14 MED ORDER — METHYLENE BLUE 1 % INJ SOLN
INTRAVENOUS | Status: AC
  Filled 2022-05-14: qty 10

## 2022-05-14 MED ORDER — DEXAMETHASONE SODIUM PHOSPHATE 10 MG/ML IJ SOLN
INTRAMUSCULAR | Status: AC
  Filled 2022-05-14: qty 1

## 2022-05-14 MED ORDER — ORAL CARE MOUTH RINSE: 15.0000 mL | Freq: Once | OROMUCOSAL | Status: AC

## 2022-05-14 MED ORDER — LACTATED RINGERS IV SOLN: INTRAVENOUS | Status: DC

## 2022-05-14 MED ORDER — CEFAZOLIN IN SODIUM CHLORIDE 3-0.9 GM/100ML-% IV SOLN
3.0000 g | INTRAVENOUS | Status: AC
  Administered 2022-05-14: 3 g via INTRAVENOUS
  Filled 2022-05-14: qty 100

## 2022-05-14 MED ORDER — EPHEDRINE 5 MG/ML INJ
INTRAVENOUS | Status: AC
  Filled 2022-05-14: qty 5

## 2022-05-14 MED ORDER — PROPOFOL 10 MG/ML IV BOLUS
INTRAVENOUS | Status: AC
  Filled 2022-05-14: qty 20

## 2022-05-14 MED ORDER — OXYCODONE HCL 5 MG PO TABS: 5.0000 mg | ORAL_TABLET | Freq: Once | ORAL | Status: DC | PRN

## 2022-05-14 MED ORDER — DEXAMETHASONE SODIUM PHOSPHATE 10 MG/ML IJ SOLN
INTRAMUSCULAR | Status: DC | PRN
  Administered 2022-05-14: 10 mg via INTRAVENOUS

## 2022-05-14 MED ORDER — LIDOCAINE HCL (CARDIAC) PF 100 MG/5ML IV SOSY
PREFILLED_SYRINGE | INTRAVENOUS | Status: DC | PRN
  Administered 2022-05-14: 60 mg via INTRAVENOUS

## 2022-05-14 MED ORDER — FENTANYL CITRATE (PF) 100 MCG/2ML IJ SOLN
INTRAMUSCULAR | Status: AC
  Filled 2022-05-14: qty 2

## 2022-05-14 MED ORDER — CHLORHEXIDINE GLUCONATE 0.12 % MT SOLN
15.0000 mL | Freq: Once | OROMUCOSAL | Status: AC
  Administered 2022-05-14: 15 mL via OROMUCOSAL
  Filled 2022-05-14: qty 15

## 2022-05-14 SURGICAL SUPPLY — 49 items
APPLIER CLIP 9.375 MED OPEN (MISCELLANEOUS) ×4
BAG COUNTER SPONGE SURGICOUNT (BAG) ×2 IMPLANT
BENZOIN TINCTURE PRP APPL 2/3 (GAUZE/BANDAGES/DRESSINGS) IMPLANT
CANISTER SUCT 3000ML PPV (MISCELLANEOUS) ×2 IMPLANT
CHLORAPREP W/TINT 26 (MISCELLANEOUS) ×2 IMPLANT
CLIP APPLIE 9.375 MED OPEN (MISCELLANEOUS) IMPLANT
CNTNR URN SCR LID CUP LEK RST (MISCELLANEOUS) ×1 IMPLANT
CONT SPEC 4OZ STRL OR WHT (MISCELLANEOUS) ×1
COVER PROBE W GEL 5X96 (DRAPES) ×2 IMPLANT
COVER SURGICAL LIGHT HANDLE (MISCELLANEOUS) ×2 IMPLANT
DERMABOND ADVANCED (GAUZE/BANDAGES/DRESSINGS) ×1
DERMABOND ADVANCED .7 DNX12 (GAUZE/BANDAGES/DRESSINGS) IMPLANT
DRAPE CHEST BREAST 15X10 FENES (DRAPES) ×2 IMPLANT
ELECT REM PT RETURN 9FT ADLT (ELECTROSURGICAL) ×2
ELECTRODE REM PT RTRN 9FT ADLT (ELECTROSURGICAL) ×1 IMPLANT
GAUZE 4X4 16PLY ~~LOC~~+RFID DBL (SPONGE) ×2 IMPLANT
GAUZE SPONGE 4X4 12PLY STRL (GAUZE/BANDAGES/DRESSINGS) IMPLANT
GLOVE BIO SURGEON STRL SZ7 (GLOVE) ×2 IMPLANT
GLOVE BIOGEL PI IND STRL 7.5 (GLOVE) ×1 IMPLANT
GLOVE BIOGEL PI INDICATOR 7.5 (GLOVE) ×1
GOWN STRL REUS W/ TWL LRG LVL3 (GOWN DISPOSABLE) ×2 IMPLANT
GOWN STRL REUS W/TWL LRG LVL3 (GOWN DISPOSABLE) ×2
HEMOSTAT ARISTA ABSORB 3G PWDR (HEMOSTASIS) ×1 IMPLANT
KIT BASIN OR (CUSTOM PROCEDURE TRAY) ×2 IMPLANT
KIT TURNOVER KIT B (KITS) ×2 IMPLANT
NDL 18GX1X1/2 (RX/OR ONLY) (NEEDLE) IMPLANT
NDL FILTER BLUNT 18X1 1/2 (NEEDLE) IMPLANT
NDL HYPO 25GX1X1/2 BEV (NEEDLE) ×1 IMPLANT
NEEDLE 18GX1X1/2 (RX/OR ONLY) (NEEDLE) IMPLANT
NEEDLE FILTER BLUNT 18X 1/2SAF (NEEDLE)
NEEDLE FILTER BLUNT 18X1 1/2 (NEEDLE) IMPLANT
NEEDLE HYPO 25GX1X1/2 BEV (NEEDLE) ×2 IMPLANT
NS IRRIG 1000ML POUR BTL (IV SOLUTION) ×2 IMPLANT
PACK GENERAL/GYN (CUSTOM PROCEDURE TRAY) ×2 IMPLANT
PAD ARMBOARD 7.5X6 YLW CONV (MISCELLANEOUS) ×2 IMPLANT
PENCIL SMOKE EVACUATOR (MISCELLANEOUS) ×2 IMPLANT
RETRACTOR ONETRAX LX 135X30 (MISCELLANEOUS) ×1 IMPLANT
SPIKE FLUID TRANSFER (MISCELLANEOUS) ×2 IMPLANT
STRIP CLOSURE SKIN 1/2X4 (GAUZE/BANDAGES/DRESSINGS) ×1 IMPLANT
SUT ETHILON 3 0 FSL (SUTURE) ×1 IMPLANT
SUT MNCRL AB 4-0 PS2 18 (SUTURE) ×2 IMPLANT
SUT SILK 2 0 SH (SUTURE) ×1 IMPLANT
SUT VIC AB 2-0 SH 27 (SUTURE) ×2
SUT VIC AB 2-0 SH 27XBRD (SUTURE) ×1 IMPLANT
SUT VIC AB 3-0 SH 27 (SUTURE) ×1
SUT VIC AB 3-0 SH 27XBRD (SUTURE) ×1 IMPLANT
SYR CONTROL 10ML LL (SYRINGE) ×2 IMPLANT
TOWEL GREEN STERILE (TOWEL DISPOSABLE) ×2 IMPLANT
TOWEL GREEN STERILE FF (TOWEL DISPOSABLE) ×2 IMPLANT

## 2022-05-14 NOTE — Interval H&P Note (Signed)
History and Physical Interval Note:  05/14/2022 8:46 AM Have disucssed need to return, have injected magtrace in preop. Will proceed today [name]  has presented today for surgery, with the diagnosis of LEFT BREAST CANCER.  The various methods of treatment have been discussed with the patient and family. After consideration of risks, benefits and other options for treatment, the patient has consented to  Procedure(s) with comments: LEFT AXILLARY SENTINEL NODE BIOPSY, POSSIBLE LOW AXILLARY NODE DISSECTION (Left) - GEN w/ PEC BLOCK as a surgical intervention.  The patient's history has been reviewed, patient examined, no change in status, stable for surgery.  I have reviewed the patient's chart and labs.  Questions were answered to the patient's satisfaction.     Gabrielle [name]

## 2022-05-14 NOTE — Transfer of Care (Signed)
Immediate Anesthesia Transfer of Care Note  Patient: Gabrielle [name]  Procedure(s) Performed: LEFT AXILLARY SENTINEL NODE BIOPSY (Left: Breast)  Patient Location: PACU  Anesthesia Type:GA combined with regional for post-op pain  Level of Consciousness: drowsy  Airway & Oxygen Therapy: Patient Spontanous Breathing and Patient connected to face mask oxygen  Post-op Assessment: Report given to RN and Post -op Vital signs reviewed and stable  Post vital signs: Reviewed and stable  Last Vitals:  Vitals Value Taken Time  BP 107/58 05/14/22 1024  Temp    Pulse 57 05/14/22 1027  Resp 21 05/14/22 1027  SpO2 98 % 05/14/22 1027  Vitals shown include unvalidated device data.  Last Pain:  Vitals:   05/14/22 0754  TempSrc:   PainSc: 8       Patients Stated Pain Goal: 1 (05/14/22 0754)  Complications: No notable events documented.

## 2022-05-15 ENCOUNTER — Encounter (HOSPITAL_COMMUNITY): Payer: Self-pay | Admitting: General Surgery

## 2022-05-15 LAB — SURGICAL PATHOLOGY

## 2022-05-23 ENCOUNTER — Encounter: Payer: Self-pay | Admitting: *Deleted

## 2022-05-28 ENCOUNTER — Inpatient Hospital Stay (HOSPITAL_BASED_OUTPATIENT_CLINIC_OR_DEPARTMENT_OTHER): Payer: Medicaid Other | Admitting: Hematology

## 2022-05-28 ENCOUNTER — Encounter: Payer: Self-pay | Admitting: Hematology

## 2022-05-28 ENCOUNTER — Inpatient Hospital Stay: Payer: Medicaid Other | Attending: Hematology

## 2022-05-28 ENCOUNTER — Other Ambulatory Visit: Payer: Self-pay

## 2022-05-28 VITALS — BP 113/75 | HR 74 | Temp 98.3°F | Resp 17 | Ht 70.0 in | Wt 278.4 lb

## 2022-05-28 DIAGNOSIS — Z171 Estrogen receptor negative status [ER-]: Secondary | ICD-10-CM

## 2022-05-28 DIAGNOSIS — Z5111 Encounter for antineoplastic chemotherapy: Secondary | ICD-10-CM | POA: Insufficient documentation

## 2022-05-28 DIAGNOSIS — Z8042 Family history of malignant neoplasm of prostate: Secondary | ICD-10-CM | POA: Diagnosis not present

## 2022-05-28 DIAGNOSIS — Z803 Family history of malignant neoplasm of breast: Secondary | ICD-10-CM | POA: Insufficient documentation

## 2022-05-28 DIAGNOSIS — Z5112 Encounter for antineoplastic immunotherapy: Secondary | ICD-10-CM | POA: Diagnosis not present

## 2022-05-28 DIAGNOSIS — C50412 Malignant neoplasm of upper-outer quadrant of left female breast: Secondary | ICD-10-CM | POA: Diagnosis not present

## 2022-05-28 DIAGNOSIS — F419 Anxiety disorder, unspecified: Secondary | ICD-10-CM | POA: Diagnosis not present

## 2022-05-28 DIAGNOSIS — G4700 Insomnia, unspecified: Secondary | ICD-10-CM | POA: Insufficient documentation

## 2022-05-28 MED ORDER — PROCHLORPERAZINE MALEATE 10 MG PO TABS: 10.0000 mg | ORAL_TABLET | Freq: Four times a day (QID) | ORAL | 1 refills | Status: DC | PRN

## 2022-05-28 MED ORDER — ONDANSETRON HCL 8 MG PO TABS: 8.0000 mg | ORAL_TABLET | Freq: Two times a day (BID) | ORAL | 1 refills | Status: DC | PRN

## 2022-05-28 MED ORDER — LIDOCAINE-PRILOCAINE 2.5-2.5 % EX CREA: TOPICAL_CREAM | CUTANEOUS | 3 refills | Status: DC

## 2022-05-28 NOTE — Progress Notes (Addendum)
Gabrielle [name]   [phone] Fax:([phone]) [phone]   Clinic Follow up Note   Patient Care Team: Trey Sailors, PA as PCP - General (Physician Assistant) Rockwell Germany, RN as Oncology Nurse Navigator Mauro Kaufmann, RN as Oncology Nurse Navigator Rolm Bookbinder, MD as Consulting Physician (General Surgery) Truitt Merle, MD as Consulting Physician (Hematology) Kyung Rudd, MD as Consulting Physician (Radiation Oncology) Latanya Maudlin, MD as Consulting Physician (Orthopedic Surgery) 05/28/2022  CHIEF COMPLAINT:   ASSESSMENT & PLAN:  Gabrielle [name] is a 31 y.o. pre-menopausal female with    1. 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 B/l MM and left [place] on 03/08/22 showed a 3.7 cm mass at 12 o'clock. Biopsy on 03/11/22 showed metaplastic carcinoma, G3, with Ki67 40%, triple negative  -she underwent left lumpectomy on 04/16/2022 and SLN biopsy on 05/14/2022, I reviewed her surgical path with her  -Also metaplastic carcinoma is less sensitive to chemotherapy, given the large size of tumor, her young age, I still recommend adjuvant chemotherapy with carboplatin and Taxol for 3 months, followed by [facility] every 3 weeks for 4 cycles, along with Keytruda for 1 year --Chemotherapy consent: Side effects including but does not not limited to, fatigue, nausea, vomiting, diarrhea, hair loss, neuropathy, fluid retention, renal and kidney dysfunction, neutropenic fever, needed for blood transfusion, bleeding, cardiomyopathy which could be irreversible, immunotherapy related autoimmune disease, especially skin rash, joint pain, thyroid and other endocrine disorders, etc were discussed with patient in great detail. She agrees to proceed. -The goal of chemotherapy is curative -Her baseline echocardiogram from May 2023 was normal. -Status fatigue,  significant left shoulder pain from recent surgery, will give her 2-3 weeks to recover well from surgery, she will follow-up with Dr. Donne Hazel later this week.   2. 2. Anxiety, Insomnia -she reports a history of anxiety, relieved with marijuana -she notes recent insomnia, likely related to cancer diagnosis. I will prescribe ambien for her to use only as needed.   3. Family History, Genetics -she has a strong family history of breast cancer, in both sides of her family. Given this and her young age, she is eligible for genetic testing. (Of note, Dr. Donne Hazel is also her mother's surgeon)  Plan -I recommend adjuvant chemotherapy to start in 2 to 3 weeks, waiting for Dr. Cristal Generous input and OK to start chemo -chemo class today -She will meet our nurse for the nausea study -I plan to see her back before the first treatment.  SUMMARY OF ONCOLOGIC HISTORY: Oncology History Overview Note   Cancer Staging  Malignant neoplasm of upper-outer quadrant of left breast in female, estrogen receptor negative ([place]) Staging form: Breast, AJCC 8th Edition - Clinical stage from 03/11/2022: Stage IIB (cT2, cN0, cM0, G3, ER-, PR-, HER2-) - Signed by Truitt Merle, MD on 03/19/2022    Malignant neoplasm of upper-outer quadrant of left breast in female, estrogen receptor negative ([place])  03/08/2022 Mammogram   CLINICAL DATA:  31 year old female presenting for evaluation of a palpable lump in the left breast which she feels has increased in size since she first identified it. Her mother was diagnosed with breast cancer within the last 6 months. She also has family history of breast cancer in multiple aunts and her maternal grandmother.   EXAM: DIGITAL DIAGNOSTIC BILATERAL MAMMOGRAM WITH TOMOSYNTHESIS AND CAD; ULTRASOUND LEFT BREAST LIMITED  IMPRESSION: 1. There is a suspicious  3.7 cm mass in the left breast at 12 o'clock.   2.  No evidence of left axillary lymphadenopathy.   3.  No evidence of malignancy  in the right breast.   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 Based on the biopsy, the carcinoma appears Nottingham grade 3 of 3 and measures 1.2 cm in greatest linear extent.  PROGNOSTIC INDICATORS Results: The tumor cells are NEGATIVE for Her2 (0). Estrogen Receptor: 0%, NEGATIVE Progesterone Receptor: 0%, NEGATIVE Proliferation Marker Ki67: 40%   03/15/2022 Initial Diagnosis   Malignant neoplasm of upper-outer quadrant of left breast in female, estrogen receptor negative ([place])   03/28/2022 Genetic Testing   Negative hereditary cancer genetic testing: no pathogenic variants detected in [facility] BRCAPlus Panel or [employer] CustomNext-Cancer +RNAinsight Panel.  Report dates are 03/28/2022 and 03/31/2022. Marland Kitchen   The BRCAplus panel offered by [employer] and includes sequencing and deletion/duplication analysis for the following 8 genes: ATM, BRCA1, BRCA2, CDH1, CHEK2, PALB2, PTEN, and TP53.  The CustomNext-Cancer+RNAinsight panel offered by [employer] [facility] includes sequencing and rearrangement analysis for the following 47 genes:  APC, ATM, AXIN2, BARD1, BMPR1A, BRCA1, BRCA2, BRIP1, CDH1, CDK4, CDKN2A, CHEK2, DICER1, EPCAM, GREM1, HOXB13, MEN1, MLH1, MSH2, MSH3, MSH6, MUTYH, NBN, NF1, NF2, NTHL1, PALB2, PMS2, POLD1, POLE, PTEN, RAD51C, RAD51D, RECQL, RET, SDHA, SDHAF2, SDHB, SDHC, SDHD, SMAD4, SMARCA4, STK11, TP53, TSC1, TSC2, and VHL.  RNA data is routinely analyzed for use in variant interpretation for all genes.    05/14/2022 Cancer Staging   Staging form: Breast, AJCC 8th Edition - Pathologic stage from 05/14/2022: Stage IIA (pT2, pN0, cM0, G3, ER-, PR-, HER2-) - Signed by Truitt Merle, MD on 05/28/2022 Stage prefix: Initial diagnosis Histologic grading system: 3 grade system Residual tumor (R): R0 - None     CURRENT THERAPY: pending adjuvant chemo   INTERVAL HISTORY: Mr. Kiester returns for follow-up.  She underwent left lumpectomy on Apr 16, 2022, and sentinel lymph node biopsy on May 14, 2022.  She overall tolerated surgery well, but still feels fatigued, and has significant left shoulder and upper arm pain probably related to her surgery.  She has no edema in the left arm or breast.  She lives with her brother now, one of her 2 children lives with her sister now when she is recovering from surgery.  The rest of review of systems negative.  MEDICAL HISTORY:  Past Medical History:  Diagnosis Date   Arthritis    Bilateral Knees   Cancer ([place]) 03/11/2022   Breast   Family history of breast cancer 03/21/2022   Family history of prostate cancer 03/21/2022   Hypercholesteremia     SURGICAL HISTORY: Past Surgical History:  Procedure Laterality Date   AXILLARY SENTINEL NODE BIOPSY Left 05/14/2022   Procedure: LEFT AXILLARY SENTINEL NODE BIOPSY;  Surgeon: Rolm Bookbinder, MD;  Location: [facility];  Service: General;  Laterality: Left;  GEN w/ PEC BLOCK   BREAST BIOPSY Left 03/11/2022   BREAST LUMPECTOMY WITH AXILLARY LYMPH NODE BIOPSY Left 04/16/2022   Procedure: LEFT BREAST LUMPECTOMY WITH LEFT AXILLARY SENTINEL  LYMPH NODE BIOPSY;  Surgeon: Rolm Bookbinder, MD;  Location: [facility];  Service: General;  Laterality: Left;   DENTAL SURGERY     PORTACATH PLACEMENT Right 04/16/2022   Procedure: INSERTION PORT-A-CATH;  Surgeon: Rolm Bookbinder, MD;  Location: [facility];  Service: General;  Laterality: Right;    I have reviewed the social history and family history  with the patient and they are unchanged from previous note.  ALLERGIES:  has No Known Allergies.  MEDICATIONS:  Current Outpatient Medications  Medication Sig Dispense Refill   acetaminophen (TYLENOL) 500 MG tablet Take 1,000 mg by mouth every 6 (six) hours as needed (pain.).     ibuprofen (ADVIL) 200 MG tablet Take 200 mg by mouth every 8 (eight) hours as needed (pain).     oxyCODONE (OXY IR/ROXICODONE) 5 MG immediate release tablet Take 1 tablet (5 mg total) by mouth every 6 (six) hours as needed. (Patient not taking: Reported on 05/02/2022) 10 tablet 0   oxyCODONE (OXY IR/ROXICODONE) 5 MG immediate release tablet Take 1 tablet (5 mg total) by mouth every 6 (six) hours as needed for moderate pain, severe pain or breakthrough pain. 10 tablet 0   prochlorperazine (COMPAZINE) 10 MG tablet Take 1 tablet (10 mg total) by mouth every 6 (six) hours as needed for nausea or vomiting. (Patient not taking: Reported on 05/02/2022) 30 tablet 0   traMADol (ULTRAM) 50 MG tablet Take 50 mg by mouth 2 (two) times daily as needed for pain.     zolpidem (AMBIEN) 5 MG tablet Take 1 tablet (5 mg total) by mouth at bedtime as needed for sleep. (Patient not taking: Reported on 05/02/2022) 20 tablet 0   No current facility-administered medications for this visit.    PHYSICAL EXAMINATION: ECOG PERFORMANCE STATUS: 1 - Symptomatic but completely ambulatory  There were no vitals filed for this visit. There were no vitals filed for this visit.  GENERAL:alert, no distress and comfortable SKIN: skin color, texture, turgor are normal, no rashes or significant lesions EYES: normal, Conjunctiva are pink and non-injected, sclera clear OROPHARYNX:no exudate, no erythema and lips, buccal mucosa, and tongue normal  NECK: supple, thyroid normal size, non-tender, without nodularity LYMPH:  no palpable lymphadenopathy in the cervical, axillary or inguinal LUNGS: clear to auscultation and percussion with normal breathing  effort HEART: regular rate & rhythm and no murmurs and no lower extremity edema ABDOMEN:abdomen soft, non-tender and normal bowel sounds Musculoskeletal:no cyanosis of digits and no clubbing  NEURO: alert & oriented x 3 with fluent speech, no focal motor/sensory deficits Breasts: Status post left lumpectomy and sentinel lymph node biopsy.  Incisions in left breast and have healed well, there is moderate soft tissue fullness around the axillary incision, with moderate tenderness.  Breast inspection showed them to be symmetrical with no nipple discharge. Palpation of the breasts and axilla revealed no obvious mass that I could appreciate.   LABORATORY DATA:  I have reviewed the data as listed    Latest Ref Rng & Units 05/09/2022    1:40 PM 03/20/2022    8:15 AM  CBC  WBC 4.0 - 10.5 K/uL 10.3  8.3   Hemoglobin 12.0 - 15.0 g/dL 12.2  12.7   Hematocrit 36.0 - 46.0 % 38.8  39.3   Platelets 150 - 400 K/uL 312  306         Latest Ref Rng & Units 03/20/2022    8:15 AM  CMP  Glucose 70 - 99 mg/dL 91  BUN 6 - 20 mg/dL 18   Creatinine 0.44 - 1.00 mg/dL 0.95   Sodium 135 - 145 mmol/L 137   Potassium 3.5 - 5.1 mmol/L 3.3   Chloride 98 - 111 mmol/L 104   CO2 22 - 32 mmol/L 26   Calcium 8.9 - 10.3 mg/dL 9.3   Total Protein 6.5 - 8.1 g/dL 7.2   Total Bilirubin 0.3 - 1.2 mg/dL 0.6   Alkaline Phos 38 - 126 U/L 64   AST 15 - 41 U/L 15   ALT 0 - 44 U/L 14       RADIOGRAPHIC STUDIES: I have personally reviewed the radiological images as listed and agreed with the findings in the report. No results found.   ASSESSMENT & PLAN:  No problem-specific Assessment & Plan notes found for this encounter.   No orders of the defined types were placed in this encounter.  All questions were answered. The patient knows to call the clinic with any problems, questions or concerns. No barriers to learning was detected. I spent 40 minutes counseling the patient face to face. The total time spent in the  appointment was 45 minutes and more than 50% was on counseling and review of test results     Truitt Merle, MD 05/28/22

## 2022-05-28 NOTE — Progress Notes (Signed)
START ON PATHWAY REGIMEN - Breast ? ? ?  Cycles 1 through 4: A cycle is every 21 days: ?    Pembrolizumab  ?    Paclitaxel  ?    Carboplatin  ?    Filgrastim-xxxx  ?  Cycles 5 through 8: A cycle is every 21 days: ?    Pembrolizumab  ?    Doxorubicin  ?    Cyclophosphamide  ?    Pegfilgrastim-xxxx  ? ?**Always confirm dose/schedule in your pharmacy ordering system** ? ?Patient Characteristics: ?Preoperative or Nonsurgical Candidate (Clinical Staging), Neoadjuvant Therapy followed by Surgery, Invasive Disease, Chemotherapy, HER2 Negative/Unknown/Equivocal, ER Negative/Unknown, Platinum Therapy Indicated and Candidate for Checkpoint Inhibitor ?Therapeutic Status: Preoperative or Nonsurgical Candidate (Clinical Staging) ?AJCC M Category: cM0 ?AJCC Grade: G3 ?Breast Surgical Plan: Neoadjuvant Therapy followed by Surgery ?ER Status: Negative (-) ?AJCC 8 Stage Grouping: IIB ?HER2 Status: Negative (-) ?AJCC T Category: cT2 ?AJCC N Category: cN0 ?PR Status: Negative (-) ?Intent of Therapy: ?Curative Intent, Discussed with Patient ?

## 2022-05-29 ENCOUNTER — Telehealth: Payer: Self-pay | Admitting: Hematology

## 2022-05-29 ENCOUNTER — Encounter: Payer: Self-pay | Admitting: *Deleted

## 2022-05-29 NOTE — Telephone Encounter (Signed)
Scheduled follow-up appointment per 7/11 los. Patient is aware. 

## 2022-05-30 ENCOUNTER — Telehealth: Payer: Self-pay | Admitting: Radiology

## 2022-05-30 NOTE — Telephone Encounter (Signed)
[id number] - TREATMENT OF REFRACTORY NAUSEA   05/29/2022  PHONE CALL: Confirmed I was speaking with Gabrielle [name] . Informed patient reason for call is to discuss the above mentioned study. Patient is interested in participation. After a second review from a research nurse, patient is not eligible for study due to weekly treatment regimen.   05/30/22  PHONE CALL: Confirmed I was speaking with Gabrielle [name] . Patient contacted scheduling staff to inquire about a research visit. This coordinator informed patient that she would not be eligible for the above mentioned study. Patient expressed understanding. Thanked patient for her time.   Carol Ada, RT(R)(T) Clinical Research Coordinator

## 2022-06-03 ENCOUNTER — Other Ambulatory Visit: Payer: Self-pay | Admitting: Hematology

## 2022-06-03 ENCOUNTER — Encounter: Payer: Self-pay | Admitting: *Deleted

## 2022-06-03 ENCOUNTER — Other Ambulatory Visit: Payer: Self-pay

## 2022-06-03 ENCOUNTER — Inpatient Hospital Stay: Payer: Medicaid Other

## 2022-06-03 VITALS — BP 113/66 | HR 70 | Temp 98.3°F | Resp 17

## 2022-06-03 DIAGNOSIS — Z171 Estrogen receptor negative status [ER-]: Secondary | ICD-10-CM

## 2022-06-03 DIAGNOSIS — Z5111 Encounter for antineoplastic chemotherapy: Secondary | ICD-10-CM | POA: Diagnosis not present

## 2022-06-03 MED ORDER — GOSERELIN ACETATE 3.6 MG ~~LOC~~ IMPL
3.6000 mg | DRUG_IMPLANT | Freq: Once | SUBCUTANEOUS | Status: AC
  Administered 2022-06-03: 3.6 mg via SUBCUTANEOUS
  Filled 2022-06-03: qty 3.6

## 2022-06-03 NOTE — Therapy (Signed)
OUTPATIENT PHYSICAL THERAPY BREAST CANCER POST OP FOLLOW UP   Patient Name: Gabrielle [name] MRN: [id number] DOB:[birth year], 31 y.o., female Today's Date: 06/04/2022   PT End of Session - 06/04/22 0908     Visit Number 2    Number of Visits 10    Date for PT Re-Evaluation 07/09/22    Authorization Type Medicaid healthyblue submitted 06/04/22    PT Start Time 0912    PT Stop Time 0952    PT Time Calculation (min) 40 min    Activity Tolerance Patient tolerated treatment well    Behavior During Therapy [facility] for tasks assessed/performed             Past Medical History:  Diagnosis Date   Arthritis    Bilateral Knees   Cancer ([place]) 03/11/2022   Breast   Family history of breast cancer 03/21/2022   Family history of prostate cancer 03/21/2022   Hypercholesteremia    Past Surgical History:  Procedure Laterality Date   AXILLARY SENTINEL NODE BIOPSY Left 05/14/2022   Procedure: LEFT AXILLARY SENTINEL NODE BIOPSY;  Surgeon: Rolm Bookbinder, MD;  Location: [place];  Service: General;  Laterality: Left;  GEN w/ PEC BLOCK   BREAST BIOPSY Left 03/11/2022   BREAST LUMPECTOMY WITH AXILLARY LYMPH NODE BIOPSY Left 04/16/2022   Procedure: LEFT BREAST LUMPECTOMY WITH LEFT AXILLARY SENTINEL LYMPH NODE BIOPSY;  Surgeon: Rolm Bookbinder, MD;  Location: [place];  Service: General;  Laterality: Left;   DENTAL SURGERY     PORTACATH PLACEMENT Right 04/16/2022   Procedure: INSERTION PORT-A-CATH;  Surgeon: Rolm Bookbinder, MD;  Location: [place];  Service: General;  Laterality: Right;   Patient Active Problem List   Diagnosis Date Noted   Genetic testing 03/29/2022   Family history of breast cancer 03/21/2022   Family history of prostate cancer 03/21/2022   Malignant neoplasm of upper-outer quadrant of left breast in female, estrogen receptor negative ([place]) 03/15/2022    PCP: Raelyn Number, PA  REFERRING PROVIDER: Dr. Burr Medico  REFERRING DIAG: Left breast  cancer  THERAPY DIAG:  Malignant neoplasm of upper-outer quadrant of left breast in female, estrogen receptor negative ([place])  Abnormal posture  Aftercare following surgery for neoplasm  Rationale for Evaluation and Treatment Rehabilitation  ONSET DATE: 03/18/22  SUBJECTIVE:                                                                                                                                                                                           SUBJECTIVE STATEMENT: The back of my arm hurts really bad.  The gabapentin and mm relaxers are helping. I  don't think the breast is swollen.  I am having chemo first and then radiation. I can't really move my arm.   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 Attempted to do SLNB but only fibroadipose tissue was removed. Returned to sx for removal of 4 negative LNs 05/14/22  PATIENT GOALS:  Reassess how my recovery is going related to arm function, pain, and swelling.  PAIN:  Are you having pain? Yes: NPRS scale: 2/10 Pain location: under arm and behind the arm  Pain description: tight, heavy  Aggravating factors: having 2 muscle spasm a week under the arm Relieving factors: medication, not using it much 2  PRECAUTIONS: Recent Surgery, left UE Lymphedema risk,  ACTIVITY LEVEL / LEISURE: limited currently    OBJECTIVE:   PATIENT SURVEYS:  QUICK DASH: 25% from 0%  OBSERVATIONS: Incisions well healed but red due to how it sits in the axilla.  Slight bulge over incision but seems to be skin vs edema.  Not wearing compression bra but has sports bra.  Reminded pt that she could get a compression or other bra with her prescription as needed.  No general edema noted  POSTURE:  Rounded shoulders   LYMPHEDEMA ASSESSMENT:  UPPER EXTREMITY AROM/PROM:   A/PROM RIGHT  03/28/2022    Shoulder extension    Shoulder flexion    Shoulder  abduction    Shoulder internal rotation    Shoulder external rotation                            (Blank rows = not tested)   A/PROM LEFT  03/28/2022 06/04/22  Shoulder extension 54 28 - pain  Shoulder flexion 155 85 - pain  Shoulder abduction 165 88 - pain  Shoulder internal rotation     Shoulder external rotation 95                           (Blank rows = not tested)    CERVICAL AROM: All within normal limits:    LYMPHEDEMA ASSESSMENTS:    LANDMARK RIGHT  06/04/22  10 cm proximal to olecranon process  38  Olecranon process  27.5  10 cm proximal to ulnar styloid process  22.0  Just proximal to ulnar styloid process  16.4  Across hand at thumb web space  19  At base of 2nd digit  6.4  (Blank rows = not tested)   [facility] LEFT  03/28/2022 06/04/22  10 cm proximal to olecranon process 40 39  Olecranon process '28 30  10 '$ cm proximal to ulnar styloid process 22 21.4  Just proximal to ulnar styloid process 16.5 16.2  Across hand at thumb web space 19.5 18.5  At base of 2nd digit 6.4 6.1  (Blank rows = not teste   PATIENT EDUCATION:  Education details: nerve healing, desensitization principles, not guarding arm when walking, TE per below and per each daily note Person educated: Patient Education method: Explanation, Demonstration, Verbal cues, and Handouts Education comprehension: verbalized understanding, returned demonstration, verbal cues required, and needs further education   HOME EXERCISE PROGRAM: Access Code: [email or web address] URL: [email or web address] Date: 06/04/2022 Prepared by: Shan Levans  Exercises - Supine Shoulder Flexion with Dowel AAROM - Palms Up  - 1-2 x daily - 7 x weekly - 1 sets - 5 reps - 5 second hold - Flexion-Extension Shoulder Pendulum with Table  Support  - 1-2 x daily - 7 x weekly - 1 sets - 5 reps - 10second hold - Standing Shoulder Abduction ROM with Dowel  - 1 x daily - 7 x weekly - 1-3 sets - 5 reps - 3 second  hold    ASSESSMENT:  CLINICAL IMPRESSION: Pt presents post lumpectomy and removal of 4 LNs with significant nerve pain and guarding of the UE.  UE AROM is decreased by almost 50% from baseline.  Discussed how nothing can be injured at this point and that is it okay to move and stretch with nerve pain and that this is beneficial.  Changed TE to the below exercises.  Attempted PROM today but too much guarding and pain.  Incisions are well healed and no edema noted.  Pt did better with AAROM today and tolerated these well.    Pt will benefit from skilled therapeutic intervention to improve on the following deficits: Decreased knowledge of precautions, impaired UE functional use, pain, decreased ROM, postural dysfunction.   PT treatment/interventions: ADL/Self care home management, Therapeutic exercises, Patient/Family education, Self Care, DME instructions, Taping, Manual therapy, and Re-evaluation     GOALS: Goals reviewed with patient? Yes  LONG TERM GOALS:  (STG=LTG)  GOALS Name Target Date  Goal status  1 Pt will demonstrate she has regained full shoulder ROM and function post operatively compared to baselines.  Baseline: 07/09/2022 ONGOING  2 Pt will be scheduled for continued SOZO surveillance 07/09/2022 INITIAL  3 Pt will be educated on lymphedema risk reduction 07/09/22 Initial   Pt will return to PLOF at home due to no limitations with the LT UE 07/09/22 Initial     PLAN: PT FREQUENCY/DURATION: 2x per week x 4 weeks and then SOZO screens  PLAN FOR NEXT SESSION: AAROM/PROM to improve Lt shoulder ROM    Gadiel John, Adrian Prince, PT 06/04/2022, 9:59 AM

## 2022-06-04 ENCOUNTER — Encounter: Payer: Self-pay | Admitting: *Deleted

## 2022-06-04 ENCOUNTER — Ambulatory Visit: Payer: Medicaid Other | Attending: Hematology | Admitting: Rehabilitation

## 2022-06-04 ENCOUNTER — Encounter: Payer: Self-pay | Admitting: Rehabilitation

## 2022-06-04 DIAGNOSIS — Z483 Aftercare following surgery for neoplasm: Secondary | ICD-10-CM | POA: Diagnosis present

## 2022-06-04 DIAGNOSIS — Z171 Estrogen receptor negative status [ER-]: Secondary | ICD-10-CM | POA: Insufficient documentation

## 2022-06-04 DIAGNOSIS — R293 Abnormal posture: Secondary | ICD-10-CM | POA: Diagnosis present

## 2022-06-04 DIAGNOSIS — C50412 Malignant neoplasm of upper-outer quadrant of left female breast: Secondary | ICD-10-CM | POA: Insufficient documentation

## 2022-06-05 ENCOUNTER — Other Ambulatory Visit: Payer: Self-pay

## 2022-06-05 ENCOUNTER — Other Ambulatory Visit: Payer: Self-pay | Admitting: *Deleted

## 2022-06-05 ENCOUNTER — Encounter: Payer: Self-pay | Admitting: Hematology

## 2022-06-05 NOTE — Progress Notes (Signed)
Called pt to introduce myself as her Arboriculturist and to discuss the [employer].  Pt's insurance should pay for her treatment at 100%.  I informed her of the [employer] and went over what it covers.  Pt is not working right now so she will provide a letter of support.  Once received I will approve her for the grant.  I emailed her a Dispensing optician and I will give her my card at her next visit.

## 2022-06-06 ENCOUNTER — Other Ambulatory Visit: Payer: Self-pay

## 2022-06-07 ENCOUNTER — Inpatient Hospital Stay: Payer: Medicaid Other

## 2022-06-07 NOTE — Progress Notes (Signed)
Pharmacist Chemotherapy Monitoring - Initial Assessment    Anticipated start date: 06/14/22   The following has been reviewed per standard work regarding the patient's treatment regimen: The patient's diagnosis, treatment plan and drug doses, and organ/hematologic function Lab orders and baseline tests specific to treatment regimen  The treatment plan start date, drug sequencing, and pre-medications Prior authorization status  Patient's documented medication list, including drug-drug interaction screen and prescriptions for anti-emetics and supportive care specific to the treatment regimen The drug concentrations, fluid compatibility, administration routes, and timing of the medications to be used The patient's access for treatment and lifetime cumulative dose history, if applicable  The patient's medication allergies and previous infusion related reactions, if applicable   Changes made to treatment plan:  N/A  Follow up needed:  N/A   Philomena Course, RPH, 06/07/2022  1:25 PM

## 2022-06-10 ENCOUNTER — Other Ambulatory Visit: Payer: Self-pay

## 2022-06-12 ENCOUNTER — Other Ambulatory Visit: Payer: Self-pay

## 2022-06-13 ENCOUNTER — Ambulatory Visit: Payer: Medicaid Other

## 2022-06-13 DIAGNOSIS — Z483 Aftercare following surgery for neoplasm: Secondary | ICD-10-CM

## 2022-06-13 DIAGNOSIS — C50412 Malignant neoplasm of upper-outer quadrant of left female breast: Secondary | ICD-10-CM | POA: Diagnosis not present

## 2022-06-13 DIAGNOSIS — R293 Abnormal posture: Secondary | ICD-10-CM

## 2022-06-13 NOTE — Therapy (Addendum)
OUTPATIENT PHYSICAL THERAPY BREAST CANCER TREATMENT NOTE   Patient Name: Gabrielle [name] MRN: [id number] DOB:January 25, 1991, 31 y.o., female Today's Date: 06/13/2022   PT End of Session - 06/13/22 0935     Visit Number 3    Date for PT Re-Evaluation 07/09/22    Authorization Type Medicaid healthyblue submitted 06/04/22    PT Start Time 0935    PT Stop Time 4287    PT Time Calculation (min) 40 min    Activity Tolerance Patient tolerated treatment well    Behavior During Therapy [facility] for tasks assessed/performed             Past Medical History:  Diagnosis Date   Arthritis    Bilateral Knees   Cancer ([place]) 03/11/2022   Breast   Family history of breast cancer 03/21/2022   Family history of prostate cancer 03/21/2022   Hypercholesteremia    Past Surgical History:  Procedure Laterality Date   AXILLARY SENTINEL NODE BIOPSY Left 05/14/2022   Procedure: LEFT AXILLARY SENTINEL NODE BIOPSY;  Surgeon: Rolm Bookbinder, MD;  Location: [place];  Service: General;  Laterality: Left;  GEN w/ PEC BLOCK   BREAST BIOPSY Left 03/11/2022   BREAST LUMPECTOMY WITH AXILLARY LYMPH NODE BIOPSY Left 04/16/2022   Procedure: LEFT BREAST LUMPECTOMY WITH LEFT AXILLARY SENTINEL LYMPH NODE BIOPSY;  Surgeon: Rolm Bookbinder, MD;  Location: [place];  Service: General;  Laterality: Left;   DENTAL SURGERY     PORTACATH PLACEMENT Right 04/16/2022   Procedure: INSERTION PORT-A-CATH;  Surgeon: Rolm Bookbinder, MD;  Location: [place];  Service: General;  Laterality: Right;   Patient Active Problem List   Diagnosis Date Noted   Genetic testing 03/29/2022   Family history of breast cancer 03/21/2022   Family history of prostate cancer 03/21/2022   Malignant neoplasm of upper-outer quadrant of left breast in female, estrogen receptor negative ([place]) 03/15/2022    PCP: Raelyn Number, PA  REFERRING PROVIDER: Dr. Burr Medico  REFERRING DIAG: Left breast cancer  THERAPY DIAG:   Malignant neoplasm of upper-outer quadrant of left breast in female, estrogen receptor negative ([place])  Abnormal posture  Aftercare following surgery for neoplasm  Rationale for Evaluation and Treatment Rehabilitation  ONSET DATE: 03/18/22  SUBJECTIVE:                                                                                                                                                                                           SUBJECTIVE STATEMENT: Patient reports she is still having left shoulder pain.  She rates this a 6/10 but she feels this is likely due to trying to decrease  her pain medication.  Still having pain at the back of the arm.  My left breast feels tight and restricted, almost heavy.    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 Attempted to do SLNB but only fibroadipose tissue was removed. Returned to sx for removal of 4 negative LNs 05/14/22  PATIENT GOALS:  Reassess how my recovery is going related to arm function, pain, and swelling.  PAIN:  Are you having pain? Yes: NPRS scale: 2/10 Pain location: under arm and behind the arm  Pain description: tight, heavy  Aggravating factors: having 2 muscle spasm a week under the arm Relieving factors: medication, not using it much 2  PRECAUTIONS: Recent Surgery, left UE Lymphedema risk,  ACTIVITY LEVEL / LEISURE: limited currently    OBJECTIVE:   PATIENT SURVEYS:  QUICK DASH: 25% from 0%  OBSERVATIONS: Incisions well healed but red due to how it sits in the axilla.  Slight bulge over incision but seems to be skin vs edema.  Not wearing compression bra but has sports bra.  Reminded pt that she could get a compression or other bra with her prescription as needed.  No general edema noted  POSTURE:  Rounded shoulders   LYMPHEDEMA ASSESSMENT:  UPPER EXTREMITY AROM/PROM:   A/PROM RIGHT  03/28/2022    Shoulder  extension    Shoulder flexion    Shoulder abduction    Shoulder internal rotation    Shoulder external rotation                            (Blank rows = not tested)   A/PROM LEFT  03/28/2022 06/04/22  Shoulder extension 54 28 - pain  Shoulder flexion 155 85 - pain  Shoulder abduction 165 88 - pain  Shoulder internal rotation     Shoulder external rotation 95                           (Blank rows = not tested)    CERVICAL AROM: All within normal limits:    LYMPHEDEMA ASSESSMENTS:    LANDMARK RIGHT  06/04/22  10 cm proximal to olecranon process  38  Olecranon process  27.5  10 cm proximal to ulnar styloid process  22.0  Just proximal to ulnar styloid process  16.4  Across hand at thumb web space  19  At base of 2nd digit  6.4  (Blank rows = not tested)   [facility] LEFT  03/28/2022 06/04/22  10 cm proximal to olecranon process 40 39  Olecranon process '28 30  10 ' cm proximal to ulnar styloid process 22 21.4  Just proximal to ulnar styloid process 16.5 16.2  Across hand at thumb web space 19.5 18.5  At base of 2nd digit 6.4 6.1  (Blank rows = not teste    TODAY'S TREATMENT: 06/13/22 Pulleys : flexion, scaption and IR x 2 min each Finger ladder x 5 flexion Seated ball rolls x 20 fwd, then 10 to each side UE ranger (bottom of ranger held on wall by PT) x 10  up and down and x 10 side to side for shoulder stability Supine dowel AAROM: flexion, abduction x 10 each PROM flexion and abduction, IR and ER x 8 min    PATIENT EDUCATION:  Education details: nerve healing, desensitization principles, not guarding arm when walking, TE per below and per each  daily note Person educated: Patient Education method: Explanation, Demonstration, Verbal cues, and Handouts Education comprehension: verbalized understanding, returned demonstration, verbal cues required, and needs further education   HOME EXERCISE PROGRAM: Access Code: [email or web address] URL: [email or web address] Date:  06/04/2022 Prepared by: Shan Levans  Exercises - Supine Shoulder Flexion with Dowel AAROM - Palms Up  - 1-2 x daily - 7 x weekly - 1 sets - 5 reps - 5 second hold - Flexion-Extension Shoulder Pendulum with Table Support  - 1-2 x daily - 7 x weekly - 1 sets - 5 reps - 10second hold - Standing Shoulder Abduction ROM with Dowel  - 1 x daily - 7 x weekly - 1-3 sets - 5 reps - 3 second  hold   ASSESSMENT:  CLINICAL IMPRESSION: Patient demonstrates improved ROM but still having some issues with feeling restriction around the left breast and axilla area.  She has minimal limitation on ER and IR.  Flexion to approx 110 degrees, and abduction to approx 120 degrees.  She would benefit from continued skilled PT for left shoulder ROM and soft tissue mobilization.       GOALS: Goals reviewed with patient? Yes  LONG TERM GOALS:  (STG=LTG)  GOALS Name Target Date  Goal status  1 Pt will demonstrate she has regained full shoulder ROM and function post operatively compared to baselines.  Baseline: 07/09/2022 ONGOING  2 Pt will be scheduled for continued SOZO surveillance 07/09/2022 INITIAL  3 Pt will be educated on lymphedema risk reduction 07/09/22 Initial   Pt will return to PLOF at home due to no limitations with the LT UE 07/09/22 Initial     PLAN: PT FREQUENCY/DURATION: 2x per week x 4 weeks and then SOZO screens  PLAN FOR NEXT SESSION: AAROM/PROM to improve Lt shoulder ROM PHYSICAL THERAPY DISCHARGE SUMMARY  Visits from Start of Care: 3  Current functional level related to goals / functional outcomes: See above   Remaining deficits: See above   Education / Equipment: See above   Patient agrees to discharge. Patient goals were partially met. Patient is being discharged due to not returning since the last visit.    Anderson Malta B. Mahreen Schewe, PT 06/13/22 2:17 PM  [facility] [address], [address] [place], [place] [address] Phone # [phone] Fax  [phone]

## 2022-06-14 ENCOUNTER — Other Ambulatory Visit: Payer: Self-pay

## 2022-06-14 ENCOUNTER — Encounter: Payer: Self-pay | Admitting: Hematology

## 2022-06-14 ENCOUNTER — Inpatient Hospital Stay: Payer: Medicaid Other

## 2022-06-14 ENCOUNTER — Ambulatory Visit (HOSPITAL_COMMUNITY)
Admission: RE | Admit: 2022-06-14 | Discharge: 2022-06-14 | Disposition: A | Discharge status: Home / Self Care | Payer: Medicaid Other | Source: Ambulatory Visit | Attending: Hematology | Admitting: Hematology

## 2022-06-14 ENCOUNTER — Encounter: Payer: Self-pay | Admitting: *Deleted

## 2022-06-14 ENCOUNTER — Inpatient Hospital Stay (HOSPITAL_BASED_OUTPATIENT_CLINIC_OR_DEPARTMENT_OTHER): Payer: Medicaid Other | Admitting: Hematology

## 2022-06-14 VITALS — BP 105/61 | HR 97 | Temp 98.6°F | Resp 18 | Wt 276.1 lb

## 2022-06-14 VITALS — BP 119/71 | HR 73 | Temp 98.0°F | Resp 18

## 2022-06-14 DIAGNOSIS — Z95828 Presence of other vascular implants and grafts: Secondary | ICD-10-CM

## 2022-06-14 DIAGNOSIS — Z171 Estrogen receptor negative status [ER-]: Secondary | ICD-10-CM

## 2022-06-14 DIAGNOSIS — C50412 Malignant neoplasm of upper-outer quadrant of left female breast: Secondary | ICD-10-CM | POA: Insufficient documentation

## 2022-06-14 DIAGNOSIS — Z5111 Encounter for antineoplastic chemotherapy: Secondary | ICD-10-CM | POA: Diagnosis not present

## 2022-06-14 LAB — CBC WITH DIFFERENTIAL (CANCER CENTER ONLY)
Abs Immature Granulocytes: 0.04 10*3/uL (ref 0.00–0.07)
Basophils Absolute: 0 10*3/uL (ref 0.0–0.1)
Basophils Relative: 0 %
Eosinophils Absolute: 0.3 10*3/uL (ref 0.0–0.5)
Eosinophils Relative: 3 %
HCT: 37.5 % (ref 36.0–46.0)
Hemoglobin: 12.6 g/dL (ref 12.0–15.0)
Immature Granulocytes: 0 %
Lymphocytes Relative: 33 %
Lymphs Abs: 3 10*3/uL (ref 0.7–4.0)
MCH: 26.8 pg (ref 26.0–34.0)
MCHC: 33.6 g/dL (ref 30.0–36.0)
MCV: 79.6 fL — ABNORMAL LOW (ref 80.0–100.0)
Monocytes Absolute: 0.7 10*3/uL (ref 0.1–1.0)
Monocytes Relative: 7 %
Neutro Abs: 5.2 10*3/uL (ref 1.7–7.7)
Neutrophils Relative %: 57 %
Platelet Count: 295 10*3/uL (ref 150–400)
RBC: 4.71 MIL/uL (ref 3.87–5.11)
RDW: 13.7 % (ref 11.5–15.5)
WBC Count: 9.1 10*3/uL (ref 4.0–10.5)
nRBC: 0 % (ref 0.0–0.2)

## 2022-06-14 LAB — CMP (CANCER CENTER ONLY)
ALT: 9 U/L (ref 0–44)
AST: 14 U/L — ABNORMAL LOW (ref 15–41)
Albumin: 4.1 g/dL (ref 3.5–5.0)
Alkaline Phosphatase: 74 U/L (ref 38–126)
Anion gap: 5 (ref 5–15)
BUN: 15 mg/dL (ref 6–20)
CO2: 26 mmol/L (ref 22–32)
Calcium: 9.7 mg/dL (ref 8.9–10.3)
Chloride: 107 mmol/L (ref 98–111)
Creatinine: 1 mg/dL (ref 0.44–1.00)
GFR, Estimated: >60 mL/min (ref >60)
Glucose, Bld: 107 mg/dL — ABNORMAL HIGH (ref 70–99)
Potassium: 3.7 mmol/L (ref 3.5–5.1)
Sodium: 138 mmol/L (ref 135–145)
Total Bilirubin: 0.4 mg/dL (ref 0.3–1.2)
Total Protein: 7.1 g/dL (ref 6.5–8.1)

## 2022-06-14 LAB — TSH: TSH: 0.63 u[IU]/mL (ref 0.350–4.500)

## 2022-06-14 MED ORDER — SODIUM CHLORIDE 0.9% FLUSH
10.0000 mL | Freq: Once | INTRAVENOUS | Status: AC
  Administered 2022-06-14: 10 mL

## 2022-06-14 MED ORDER — SODIUM CHLORIDE 0.9 % IV SOLN
750.0000 mg | Freq: Once | INTRAVENOUS | Status: AC
  Administered 2022-06-14: 750 mg via INTRAVENOUS
  Filled 2022-06-14: qty 75

## 2022-06-14 MED ORDER — DIPHENHYDRAMINE HCL 50 MG/ML IJ SOLN
25.0000 mg | Freq: Once | INTRAMUSCULAR | Status: AC
  Administered 2022-06-14: 25 mg via INTRAVENOUS
  Filled 2022-06-14: qty 1

## 2022-06-14 MED ORDER — SODIUM CHLORIDE 0.9 % IV SOLN
80.0000 mg/m2 | Freq: Once | INTRAVENOUS | Status: AC
  Administered 2022-06-14: 198 mg via INTRAVENOUS
  Filled 2022-06-14: qty 33

## 2022-06-14 MED ORDER — PALONOSETRON HCL INJECTION 0.25 MG/5ML
0.2500 mg | Freq: Once | INTRAVENOUS | Status: AC
  Administered 2022-06-14: 0.25 mg via INTRAVENOUS
  Filled 2022-06-14: qty 5

## 2022-06-14 MED ORDER — SODIUM CHLORIDE 0.9 % IV SOLN: Freq: Once | INTRAVENOUS | Status: AC

## 2022-06-14 MED ORDER — SODIUM CHLORIDE 0.9 % IV SOLN
150.0000 mg | Freq: Once | INTRAVENOUS | Status: AC
  Administered 2022-06-14: 150 mg via INTRAVENOUS
  Filled 2022-06-14: qty 150

## 2022-06-14 MED ORDER — SODIUM CHLORIDE 0.9 % IV SOLN
200.0000 mg | Freq: Once | INTRAVENOUS | Status: AC
  Administered 2022-06-14: 200 mg via INTRAVENOUS
  Filled 2022-06-14: qty 200

## 2022-06-14 MED ORDER — FAMOTIDINE IN NACL 20-0.9 MG/50ML-% IV SOLN
20.0000 mg | Freq: Once | INTRAVENOUS | Status: AC
  Administered 2022-06-14: 20 mg via INTRAVENOUS
  Filled 2022-06-14: qty 50

## 2022-06-14 NOTE — Progress Notes (Signed)
[name]   [phone] [phone]   Clinic Follow up Note   Patient Care Team: Trey Sailors, PA as PCP - General (Physician Assistant) Rockwell Germany, RN as Oncology Nurse Navigator Mauro Kaufmann, RN as Oncology Nurse Navigator Rolm Bookbinder, MD as Consulting Physician (General Surgery) Truitt Merle, MD as Consulting Physician (Hematology) Kyung Rudd, MD as Consulting Physician (Radiation Oncology) Latanya Maudlin, MD as Consulting Physician (Orthopedic Surgery)  Date of Service:  06/14/2022  CHIEF COMPLAINT: f/u of left breast cancer  CURRENT THERAPY:  Adjuvant carboplatin/taxol, weekly, starting 06/14/22 Gabrielle [name], starting 06/14/22  ASSESSMENT & PLAN:  Gabrielle [name] is a 31 y.o. pre-menopausal female with   1. 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 Biopsy 03/11/22 showed metaplastic carcinoma. -baseline echo 03/25/22 was normal (EF 60-65%). -staging CT CAP and bone scan 03/26/22 negative for disease outside of the breast. 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 SLN biopsy on 05/14/22 was negative (0/4). -given large tumor size and young age, adjuvant chemo is recommended. Plan for 3 months of weekly carboplatin/taxol, followed by 4 cycles of AC every 3 weeks, with Beryle Flock  -she had port placed at time of surgery in anticipation of beginning chemotherapy with carboplatin/taxol and Keytruda today, 06/14/22. Chest x-ray was obtained today to confirm the position of her port, and this showed the tip is positioned high. I discussed that she may need to go back to surgery to have this fixed, and that we may have to give chemo peripherally today.she is agreeable.  -Potential side effects from chemotherapy and  immunotherapy reviewed with her again, including things to watch and management, she agrees to proceed. -labs reviewed, overall WNL and adequate to proceed with first chemo today as scheduled.    2. Anxiety, Insomnia -she reports a history of anxiety, relieved with marijuana -I prescribed Ambien on 04/10/22 for her to use as needed.   3. Family History, Genetics -she has a strong family history of breast cancer, in both sides of her family. (Of note, Dr. Donne Hazel is also her mother's surgeon) -genetic testing done on 03/20/22 was negative.    Plan -proceed with first carbo/taxol and Keytruda today. -lab, flush, and taxol weekly for next two weeks  -Keytruda every 3 weeks -f/u in 2 weeks  -sent a message to Dr. Donne Hazel about her port issue    No problem-specific Assessment & Plan notes found for this encounter.   SUMMARY OF ONCOLOGIC HISTORY: Oncology History Overview Note   Cancer Staging  Malignant neoplasm of upper-outer quadrant of left breast in female, estrogen receptor negative ([place]) Staging form: Breast, AJCC 8th Edition - Clinical stage from 03/11/2022: Stage IIB (cT2, cN0, cM0, G3, ER-, PR-, HER2-) - Signed by Truitt Merle, MD on 03/19/2022    Malignant neoplasm of upper-outer quadrant of left breast in female, estrogen receptor negative ([place])  03/08/2022 Mammogram   CLINICAL DATA:  31 year old female presenting for evaluation of a palpable lump in the left breast which she feels has increased in size since she first identified it. Her mother was diagnosed with breast cancer within the last 6 months. She also has family history of breast cancer in multiple aunts and her maternal grandmother.   EXAM: DIGITAL DIAGNOSTIC BILATERAL MAMMOGRAM WITH TOMOSYNTHESIS AND CAD; ULTRASOUND LEFT BREAST LIMITED  IMPRESSION: 1. There  is a suspicious 3.7 cm mass in the left breast at 12 o'clock.   2.  No evidence of left axillary lymphadenopathy.   3.  No evidence of malignancy in  the right breast.   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 Based on the biopsy, the carcinoma appears Nottingham grade 3 of 3 and measures 1.2 cm in greatest linear extent.  PROGNOSTIC INDICATORS Results: The tumor cells are NEGATIVE for Her2 (0). Estrogen Receptor: 0%, NEGATIVE Progesterone Receptor: 0%, NEGATIVE Proliferation Marker Ki67: 40%   03/15/2022 Initial Diagnosis   Malignant neoplasm of upper-outer quadrant of left breast in female, estrogen receptor negative ([place])   03/28/2022 Genetic Testing   Negative hereditary cancer genetic testing: no pathogenic variants detected in [facility] BRCAPlus Panel or [facility] CustomNext-Cancer +RNAinsight Panel.  Report dates are 03/28/2022 and 03/31/2022. Marland Kitchen   The BRCAplus panel offered by [employer] and includes sequencing and deletion/duplication analysis for the following 8 genes: ATM, BRCA1, BRCA2, CDH1, CHEK2, PALB2, PTEN, and TP53.  The CustomNext-Cancer+RNAinsight panel offered by [facility] includes sequencing and rearrangement analysis for the following 47 genes:  APC, ATM, AXIN2, BARD1, BMPR1A, BRCA1, BRCA2, BRIP1, CDH1, CDK4, CDKN2A, CHEK2, DICER1, EPCAM, GREM1, HOXB13, MEN1, MLH1, MSH2, MSH3, MSH6, MUTYH, NBN, NF1, NF2, NTHL1, PALB2, PMS2, POLD1, POLE, PTEN, RAD51C, RAD51D, RECQL, RET, SDHA, SDHAF2, SDHB, SDHC, SDHD, SMAD4, SMARCA4, STK11, TP53, TSC1, TSC2, and VHL.  RNA data is routinely analyzed for use in variant interpretation for all genes.   05/14/2022  Cancer Staging   Staging form: Breast, AJCC 8th Edition - Pathologic stage from 05/14/2022: Stage IIA (pT2, pN0, cM0, G3, ER-, PR-, HER2-) - Signed by Truitt Merle, MD on 05/28/2022 Stage prefix: Initial diagnosis Histologic grading system: 3 grade system Residual tumor (R): R0 - None   06/14/2022 -  Chemotherapy   Patient is on Treatment Plan : BREAST Pembrolizumab (200) D1 + Carboplatin (5) D1 + Paclitaxel (80) D1,8,15 q21d X 4 cycles / Pembrolizumab (200) D1 + AC D1 q21d x 4 cycles        INTERVAL HISTORY:  Gabrielle [name] is here for a follow up of breast cancer. She was last seen by me on 05/28/22. She presents to the clinic alone. She denies any new complaints   All other systems were reviewed with the patient and are negative.  MEDICAL HISTORY:  Past Medical History:  Diagnosis Date   Arthritis    Bilateral Knees   Cancer ([place]) 03/11/2022   Breast   Family history of breast cancer 03/21/2022   Family history of prostate cancer 03/21/2022   Hypercholesteremia     SURGICAL HISTORY: Past Surgical History:  Procedure Laterality Date   AXILLARY SENTINEL NODE BIOPSY Left 05/14/2022   Procedure: LEFT AXILLARY SENTINEL NODE BIOPSY;  Surgeon: Rolm Bookbinder, MD;  Location: [facility];  Service: General;  Laterality: Left;  GEN w/ PEC BLOCK   BREAST BIOPSY Left 03/11/2022   BREAST LUMPECTOMY WITH AXILLARY LYMPH NODE BIOPSY Left 04/16/2022   Procedure: LEFT BREAST LUMPECTOMY WITH LEFT AXILLARY SENTINEL LYMPH NODE BIOPSY;  Surgeon: Rolm Bookbinder, MD;  Location: [facility];  Service: General;  Laterality: Left;   DENTAL SURGERY     PORTACATH PLACEMENT Right 04/16/2022   Procedure: INSERTION PORT-A-CATH;  Surgeon: Rolm Bookbinder, MD;  Location: [facility];  Service: General;  Laterality: Right;    I have reviewed the social history and family history with the patient and they are unchanged from previous note.  ALLERGIES:  has No Known  Allergies.  MEDICATIONS:  Current Outpatient Medications  Medication Sig Dispense Refill   acetaminophen (TYLENOL) 500 MG tablet Take 1,000 mg by mouth every 6 (six) hours as needed (pain.).     ibuprofen (ADVIL) 200 MG tablet Take 200 mg by mouth every 8 (eight) hours as needed (pain).     lidocaine-prilocaine (EMLA) cream Apply to affected area once 30 g 3   ondansetron (ZOFRAN) 8 MG tablet Take 1 tablet (8 mg total) by mouth 2 (two) times daily as needed. Start on the third day after carboplatin and AC chemotherapy. 30 tablet 1   oxyCODONE (OXY IR/ROXICODONE) 5 MG immediate release tablet Take 1 tablet (5 mg total) by mouth every 6 (six) hours as needed. 10 tablet 0   oxyCODONE (OXY IR/ROXICODONE) 5 MG immediate release tablet Take 1 tablet (5 mg total) by mouth every 6 (six) hours as needed for moderate pain, severe pain or breakthrough pain. 10 tablet 0   prochlorperazine (COMPAZINE) 10 MG tablet Take 1 tablet (10 mg total) by mouth every 6 (six) hours as needed for nausea or vomiting. 30 tablet 0   prochlorperazine (COMPAZINE) 10 MG tablet Take 1 tablet (10 mg total) by mouth every 6 (six) hours as needed (Nausea or vomiting). 30 tablet 1   traMADol (ULTRAM) 50 MG tablet Take 50 mg by mouth 2 (two) times daily as needed for pain.     zolpidem (AMBIEN) 5 MG tablet Take 1 tablet (5 mg total) by mouth at bedtime as needed for sleep. 20 tablet 0   No current facility-administered medications for this visit.   Facility-Administered Medications Ordered in Other Visits  Medication Dose Route Frequency Provider Last Rate Last Admin   CARBOplatin (PARAPLATIN) 750 mg in sodium chloride 0.9 % 250 mL chemo infusion  750 mg Intravenous Once Truitt Merle, MD        PHYSICAL EXAMINATION: ECOG PERFORMANCE STATUS: 0 - Asymptomatic  Vitals:   06/14/22 1045  BP: 105/61  Pulse: 97  Resp: 18  Temp: 98.6 F (37 C)  SpO2: 99%   Wt Readings from Last 3 Encounters:  06/14/22 276 lb 1 oz (125.2 kg)   05/28/22 278 lb 6.4 oz (126.3 kg)  05/14/22 275 lb (124.7 kg)     GENERAL:alert, no distress and comfortable SKIN: skin color normal, no rashes or significant lesions EYES: normal, Conjunctiva are pink and non-injected, sclera clear  NEURO: alert & oriented x 3 with fluent speech  LABORATORY DATA:  I have reviewed the data as listed    Latest Ref Rng & Units 06/14/2022   10:27 AM 05/09/2022    1:40 PM 03/20/2022    8:15 AM  CBC  WBC 4.0 - 10.5 K/uL 9.1  10.3  8.3   Hemoglobin 12.0 - 15.0 g/dL 12.6  12.2  12.7   Hematocrit 36.0 - 46.0 % 37.5  38.8  39.3   Platelets 150 - 400 K/uL 295  312  306         Latest Ref Rng & Units 06/14/2022  10:27 AM 03/20/2022    8:15 AM  CMP  Glucose 70 - 99 mg/dL 107  91   BUN 6 - 20 mg/dL 15  18   Creatinine 0.44 - 1.00 mg/dL 1.00  0.95   Sodium 135 - 145 mmol/L 138  137   Potassium 3.5 - 5.1 mmol/L 3.7  3.3   Chloride 98 - 111 mmol/L 107  104   CO2 22 - 32 mmol/L 26  26   Calcium 8.9 - 10.3 mg/dL 9.7  9.3   Total Protein 6.5 - 8.1 g/dL 7.1  7.2   Total Bilirubin 0.3 - 1.2 mg/dL 0.4  0.6   Alkaline Phos 38 - 126 U/L 74  64   AST 15 - 41 U/L 14  15   ALT 0 - 44 U/L 9  14       RADIOGRAPHIC STUDIES: I have personally reviewed the radiological images as listed and agreed with the findings in the report. DG Chest 1 View  Result Date: 06/14/2022 CLINICAL DATA:  Port catheter tip location. Left-sided breast cancer. EXAM: CHEST  1 VIEW COMPARISON:  Chest CT of 03/26/2022.  No prior plain films. FINDINGS: Right-sided Port-A-Cath from an internal jugular approach terminates superiorly, in the region of the right brachiocephalic vein or high SVC. Midline trachea. Normal heart size and mediastinal contours. No pleural effusion or pneumothorax. Clear lungs. Left axillary node dissection and lumpectomy. IMPRESSION: Right Port-A-Cath tip positioned high, in the region of the right brachiocephalic vein or high SVC. Electronically Signed   By: Abigail Miyamoto  M.D.   On: 06/14/2022 09:52      No orders of the defined types were placed in this encounter.  All questions were answered. The patient knows to call the clinic with any problems, questions or concerns. No barriers to learning was detected. The total time spent in the appointment was 30 minutes.     Truitt Merle, MD 06/14/2022   I, Wilburn Mylar, am acting as scribe for Truitt Merle, MD.   I have reviewed the above documentation for accuracy and completeness, and I agree with the above.

## 2022-06-14 NOTE — Progress Notes (Signed)
Port [employer]. Per today's xray, port not adequate for treatment today. Dr Burr Medico has been notified. Will use PIV for D1C1 treatment.

## 2022-06-14 NOTE — Patient Instructions (Signed)
[facility] MEDICAL [facility]COLOGY   Discharge Instructions: Thank you for choosing [facility] to provide your oncology and hematology care.   If you have a lab appointment with the Cancer Center, please go directly to the Cancer Center and check in at the registration area.   Wear comfortable clothing and clothing appropriate for easy access to any Portacath or PICC line.   We strive to give you quality time with your provider. You may need to reschedule your appointment if you arrive late (15 or more minutes).  Arriving late affects you and other patients whose appointments are after yours.  Also, if you miss three or more appointments without notifying the office, you may be dismissed from the clinic at the provider's discretion.      For prescription refill requests, have your pharmacy contact our office and allow 72 hours for refills to be completed.    Today you received the following chemotherapy and/or immunotherapy agents: pembrolizumab, paclitaxel, and carboplatin      To help prevent nausea and vomiting after your treatment, we encourage you to take your nausea medication as directed.  BELOW ARE SYMPTOMS THAT SHOULD BE REPORTED IMMEDIATELY: *FEVER GREATER THAN 100.4 F (38 C) OR HIGHER *CHILLS OR SWEATING *NAUSEA AND VOMITING THAT IS NOT CONTROLLED WITH YOUR NAUSEA MEDICATION *UNUSUAL SHORTNESS OF BREATH *UNUSUAL BRUISING OR BLEEDING *URINARY PROBLEMS (pain or burning when urinating, or frequent urination) *BOWEL PROBLEMS (unusual diarrhea, constipation, pain near the anus) TENDERNESS IN MOUTH AND THROAT WITH OR WITHOUT PRESENCE OF ULCERS (sore throat, sores in mouth, or a toothache) UNUSUAL RASH, SWELLING OR PAIN  UNUSUAL VAGINAL DISCHARGE OR ITCHING   Items with * indicate a potential emergency and should be followed up as soon as possible or go to the Emergency Department if any problems should occur.  Please show the CHEMOTHERAPY ALERT CARD or  IMMUNOTHERAPY ALERT CARD at check-in to the Emergency Department and triage nurse.  Should you have questions after your visit or need to cancel or reschedule your appointment, please contact [facility]COL[facility]  Dept: [phone]  and follow the prompts.  Office hours are 8:00 a.m. to 4:30 p.m. Monday - Friday. Please note that voicemails left after 4:00 p.m. may not be returned until the following business day.  We are closed weekends and major holidays. You have access to a nurse at all times for urgent questions. Please call the main number to the clinic Dept: [phone] and follow the prompts.   For any non-urgent questions, you may also contact your provider using MyChart. We now offer e-Visits for anyone 18 and older to request care online for non-urgent symptoms. For details visit [email or web address].   Also download the MyChart app! Go to the app store, search "MyChart", open the app, select [facility], and log in with your MyChart username and password.  Masks are optional in the cancer centers. If you would like for your care team to wear a mask while they are taking care of you, please let them know. For doctor visits, patients may have with them one support person who is at least 32 years old. At this time, visitors are not allowed in the infusion area. 

## 2022-06-15 LAB — T4: T4, Total: 7.7 ug/dL (ref 4.5–12.0)

## 2022-06-17 ENCOUNTER — Telehealth: Payer: Self-pay | Admitting: *Deleted

## 2022-06-17 NOTE — Telephone Encounter (Signed)
Called pt to see how she did with her recent treatment.  She reports feeling nauseated/queezy.  States she feels horrible.  She is drinking pedialyte but not eating much.  She is urinating OK.  No BM since Saturday am.  She reports no energy.  She asked if she can take her [place].  Informed yes.  She has been taking compazine & started zofran today.  Encouraged to continue with theses as ordered & try to continue to drink plenty of fluids.  Informed that report will be given to Dr Feng/Pod RN & will ask them to check on her tomorrow in case you need to be seen.

## 2022-06-17 NOTE — Telephone Encounter (Signed)
-----   Message from Clyda Hurdle, RN sent at 06/14/2022  4:14 PM EDT ----- Regarding: 1st Time call back- Dr Burr Medico 1st Time Keytruda/Taxol/Carbo Dr Lewayne Bunting patient.

## 2022-06-18 ENCOUNTER — Telehealth: Payer: Self-pay | Admitting: Hematology

## 2022-06-18 ENCOUNTER — Ambulatory Visit: Payer: Medicaid Other | Admitting: Physical Therapy

## 2022-06-18 ENCOUNTER — Other Ambulatory Visit: Payer: Self-pay

## 2022-06-18 NOTE — Telephone Encounter (Signed)
Scheduled follow-up appointments per 7/28 los. Patient is aware. 

## 2022-06-21 ENCOUNTER — Encounter: Payer: Self-pay | Admitting: *Deleted

## 2022-06-21 ENCOUNTER — Encounter: Payer: Self-pay | Admitting: Hematology

## 2022-06-21 ENCOUNTER — Other Ambulatory Visit: Payer: Self-pay

## 2022-06-21 ENCOUNTER — Inpatient Hospital Stay: Payer: Medicaid Other | Attending: Hematology

## 2022-06-21 ENCOUNTER — Inpatient Hospital Stay: Payer: Medicaid Other

## 2022-06-21 ENCOUNTER — Encounter: Payer: Medicaid Other | Admitting: Physical Therapy

## 2022-06-21 VITALS — BP 96/64 | HR 77 | Temp 98.1°F | Resp 16 | Wt 275.8 lb

## 2022-06-21 DIAGNOSIS — G4700 Insomnia, unspecified: Secondary | ICD-10-CM | POA: Diagnosis not present

## 2022-06-21 DIAGNOSIS — Z171 Estrogen receptor negative status [ER-]: Secondary | ICD-10-CM

## 2022-06-21 DIAGNOSIS — Z853 Personal history of malignant neoplasm of breast: Secondary | ICD-10-CM | POA: Diagnosis not present

## 2022-06-21 DIAGNOSIS — R232 Flushing: Secondary | ICD-10-CM | POA: Diagnosis not present

## 2022-06-21 DIAGNOSIS — E7800 Pure hypercholesterolemia, unspecified: Secondary | ICD-10-CM | POA: Diagnosis not present

## 2022-06-21 DIAGNOSIS — Z803 Family history of malignant neoplasm of breast: Secondary | ICD-10-CM | POA: Insufficient documentation

## 2022-06-21 DIAGNOSIS — Z5111 Encounter for antineoplastic chemotherapy: Secondary | ICD-10-CM | POA: Diagnosis not present

## 2022-06-21 DIAGNOSIS — D649 Anemia, unspecified: Secondary | ICD-10-CM | POA: Diagnosis not present

## 2022-06-21 DIAGNOSIS — M79602 Pain in left arm: Secondary | ICD-10-CM | POA: Insufficient documentation

## 2022-06-21 DIAGNOSIS — Z95828 Presence of other vascular implants and grafts: Secondary | ICD-10-CM

## 2022-06-21 LAB — CBC WITH DIFFERENTIAL (CANCER CENTER ONLY)
Abs Immature Granulocytes: 0.05 10*3/uL (ref 0.00–0.07)
Basophils Absolute: 0 10*3/uL (ref 0.0–0.1)
Basophils Relative: 1 %
Eosinophils Absolute: 0.2 10*3/uL (ref 0.0–0.5)
Eosinophils Relative: 3 %
HCT: 33.7 % — ABNORMAL LOW (ref 36.0–46.0)
Hemoglobin: 11.2 g/dL — ABNORMAL LOW (ref 12.0–15.0)
Immature Granulocytes: 1 %
Lymphocytes Relative: 36 %
Lymphs Abs: 2.4 10*3/uL (ref 0.7–4.0)
MCH: 26.5 pg (ref 26.0–34.0)
MCHC: 33.2 g/dL (ref 30.0–36.0)
MCV: 79.9 fL — ABNORMAL LOW (ref 80.0–100.0)
Monocytes Absolute: 0.3 10*3/uL (ref 0.1–1.0)
Monocytes Relative: 5 %
Neutro Abs: 3.7 10*3/uL (ref 1.7–7.7)
Neutrophils Relative %: 54 %
Platelet Count: 247 10*3/uL (ref 150–400)
RBC: 4.22 MIL/uL (ref 3.87–5.11)
RDW: 13.2 % (ref 11.5–15.5)
WBC Count: 6.7 10*3/uL (ref 4.0–10.5)
nRBC: 0 % (ref 0.0–0.2)

## 2022-06-21 LAB — CMP (CANCER CENTER ONLY)
ALT: 11 U/L (ref 0–44)
AST: 18 U/L (ref 15–41)
Albumin: 4 g/dL (ref 3.5–5.0)
Alkaline Phosphatase: 84 U/L (ref 38–126)
Anion gap: 5 (ref 5–15)
BUN: 11 mg/dL (ref 6–20)
CO2: 28 mmol/L (ref 22–32)
Calcium: 9.5 mg/dL (ref 8.9–10.3)
Chloride: 103 mmol/L (ref 98–111)
Creatinine: 0.79 mg/dL (ref 0.44–1.00)
GFR, Estimated: >60 mL/min (ref >60)
Glucose, Bld: 93 mg/dL (ref 70–99)
Potassium: 3.5 mmol/L (ref 3.5–5.1)
Sodium: 136 mmol/L (ref 135–145)
Total Bilirubin: 0.3 mg/dL (ref 0.3–1.2)
Total Protein: 7.6 g/dL (ref 6.5–8.1)

## 2022-06-21 LAB — TSH: TSH: 0.789 u[IU]/mL (ref 0.350–4.500)

## 2022-06-21 MED ORDER — SODIUM CHLORIDE 0.9% FLUSH
10.0000 mL | Freq: Once | INTRAVENOUS | Status: AC
  Administered 2022-06-21: 10 mL

## 2022-06-21 MED ORDER — SODIUM CHLORIDE 0.9 % IV SOLN
80.0000 mg/m2 | Freq: Once | INTRAVENOUS | Status: AC
  Administered 2022-06-21: 198 mg via INTRAVENOUS
  Filled 2022-06-21: qty 33

## 2022-06-21 MED ORDER — FAMOTIDINE IN NACL 20-0.9 MG/50ML-% IV SOLN
20.0000 mg | Freq: Once | INTRAVENOUS | Status: AC
  Administered 2022-06-21: 20 mg via INTRAVENOUS
  Filled 2022-06-21: qty 50

## 2022-06-21 MED ORDER — SODIUM CHLORIDE 0.9% FLUSH
10.0000 mL | INTRAVENOUS | Status: DC | PRN
  Administered 2022-06-21: 10 mL

## 2022-06-21 MED ORDER — HEPARIN SOD (PORK) LOCK FLUSH 100 UNIT/ML IV SOLN
500.0000 [IU] | Freq: Once | INTRAVENOUS | Status: AC | PRN
  Administered 2022-06-21: 500 [IU]

## 2022-06-21 MED ORDER — DIPHENHYDRAMINE HCL 50 MG/ML IJ SOLN
25.0000 mg | Freq: Once | INTRAMUSCULAR | Status: AC
  Administered 2022-06-21: 25 mg via INTRAVENOUS
  Filled 2022-06-21: qty 1

## 2022-06-21 MED ORDER — SODIUM CHLORIDE 0.9 % IV SOLN: Freq: Once | INTRAVENOUS | Status: AC

## 2022-06-21 MED ORDER — ALTEPLASE 2 MG IJ SOLR
2.0000 mg | Freq: Once | INTRAMUSCULAR | Status: AC
  Administered 2022-06-21: 2 mg
  Filled 2022-06-21: qty 2

## 2022-06-21 NOTE — Progress Notes (Signed)
Pt is approved for the $1000 [employer] grant.  

## 2022-06-21 NOTE — Progress Notes (Signed)
There was back and forth as whether to use the [facility] on Ms. Kennon Rounds- per the surgeon's specialty note/note from 04/16/22 and Dr. Burr Medico- now that there is blood return- ok to use [facility] for treatment.

## 2022-06-21 NOTE — Patient Instructions (Signed)
[facility] ONCOLOGY   Discharge Instructions: Thank you for choosing [facility] to provide your oncology and hematology care.   If you have a lab appointment with the [facility], please go directly to the [facility] and check in at the registration area.   Wear comfortable clothing and clothing appropriate for easy access to any Portacath or PICC line.   We strive to give you quality time with your provider. You may need to reschedule your appointment if you arrive late (15 or more minutes).  Arriving late affects you and other patients whose appointments are after yours.  Also, if you miss three or more appointments without notifying the office, you may be dismissed from the clinic at the provider's discretion.      For prescription refill requests, have your pharmacy contact our office and allow 72 hours for refills to be completed.    Today you received the following chemotherapy and/or immunotherapy agents: Paclitaxel   To help prevent nausea and vomiting after your treatment, we encourage you to take your nausea medication as directed.  BELOW ARE SYMPTOMS THAT SHOULD BE REPORTED IMMEDIATELY: *FEVER GREATER THAN 100.4 F (38 C) OR HIGHER *CHILLS OR SWEATING *NAUSEA AND VOMITING THAT IS NOT CONTROLLED WITH YOUR NAUSEA MEDICATION *UNUSUAL SHORTNESS OF BREATH *UNUSUAL BRUISING OR BLEEDING *URINARY PROBLEMS (pain or burning when urinating, or frequent urination) *BOWEL PROBLEMS (unusual diarrhea, constipation, pain near the anus) TENDERNESS IN MOUTH AND THROAT WITH OR WITHOUT PRESENCE OF ULCERS (sore throat, sores in mouth, or a toothache) UNUSUAL RASH, SWELLING OR PAIN  UNUSUAL VAGINAL DISCHARGE OR ITCHING   Items with * indicate a potential emergency and should be followed up as soon as possible or go to the Emergency Department if any problems should occur.  Please show the CHEMOTHERAPY ALERT CARD or IMMUNOTHERAPY ALERT CARD at check-in to  the Emergency Department and triage nurse.  Should you have questions after your visit or need to cancel or reschedule your appointment, please contact [facility]  Dept: [phone]  and follow the prompts.  Office hours are 8:00 a.m. to 4:30 p.m. Monday - Friday. Please note that voicemails left after 4:00 p.m. may not be returned until the following business day.  We are closed weekends and major holidays. You have access to a nurse at all times for urgent questions. Please call the main number to the clinic Dept: [phone] and follow the prompts.   For any non-urgent questions, you may also contact your provider using MyChart. We now offer e-Visits for anyone 31 and older to request care online for non-urgent symptoms. For details visit [email or web address].   Also download the MyChart app! Go to the app store, search "MyChart", open the app, select [facility], and log in with your MyChart username and password.  Masks are optional in the cancer centers. If you would like for your care team to wear a mask while they are taking care of you, please let them know. For doctor visits, patients may have with them one support person who is at least 32 years old. At this time, visitors are not allowed in the infusion area.

## 2022-06-23 LAB — T4: T4, Total: 8.9 ug/dL (ref 4.5–12.0)

## 2022-06-25 ENCOUNTER — Encounter: Payer: Medicaid Other | Admitting: Rehabilitation

## 2022-06-27 ENCOUNTER — Encounter: Payer: Medicaid Other | Admitting: Rehabilitation

## 2022-06-28 ENCOUNTER — Other Ambulatory Visit: Payer: Self-pay

## 2022-06-28 ENCOUNTER — Inpatient Hospital Stay: Payer: Medicaid Other | Admitting: Hematology

## 2022-06-28 ENCOUNTER — Inpatient Hospital Stay: Payer: Medicaid Other

## 2022-06-28 ENCOUNTER — Inpatient Hospital Stay (HOSPITAL_BASED_OUTPATIENT_CLINIC_OR_DEPARTMENT_OTHER): Payer: Medicaid Other | Admitting: Hematology

## 2022-06-28 ENCOUNTER — Encounter: Payer: Self-pay | Admitting: Hematology

## 2022-06-28 VITALS — BP 134/74 | HR 68 | Temp 98.1°F | Resp 15 | Wt 278.5 lb

## 2022-06-28 VITALS — BP 126/79 | HR 75 | Temp 98.8°F | Resp 16

## 2022-06-28 DIAGNOSIS — Z171 Estrogen receptor negative status [ER-]: Secondary | ICD-10-CM

## 2022-06-28 DIAGNOSIS — C50412 Malignant neoplasm of upper-outer quadrant of left female breast: Secondary | ICD-10-CM

## 2022-06-28 DIAGNOSIS — Z5111 Encounter for antineoplastic chemotherapy: Secondary | ICD-10-CM | POA: Diagnosis not present

## 2022-06-28 DIAGNOSIS — Z95828 Presence of other vascular implants and grafts: Secondary | ICD-10-CM

## 2022-06-28 LAB — CBC WITH DIFFERENTIAL (CANCER CENTER ONLY)
Abs Immature Granulocytes: 0.02 10*3/uL (ref 0.00–0.07)
Basophils Absolute: 0 10*3/uL (ref 0.0–0.1)
Basophils Relative: 0 %
Eosinophils Absolute: 0.1 10*3/uL (ref 0.0–0.5)
Eosinophils Relative: 1 %
HCT: 32.6 % — ABNORMAL LOW (ref 36.0–46.0)
Hemoglobin: 10.9 g/dL — ABNORMAL LOW (ref 12.0–15.0)
Immature Granulocytes: 0 %
Lymphocytes Relative: 41 %
Lymphs Abs: 2.2 10*3/uL (ref 0.7–4.0)
MCH: 26.8 pg (ref 26.0–34.0)
MCHC: 33.4 g/dL (ref 30.0–36.0)
MCV: 80.3 fL (ref 80.0–100.0)
Monocytes Absolute: 0.5 10*3/uL (ref 0.1–1.0)
Monocytes Relative: 10 %
Neutro Abs: 2.6 10*3/uL (ref 1.7–7.7)
Neutrophils Relative %: 48 %
Platelet Count: 234 10*3/uL (ref 150–400)
RBC: 4.06 MIL/uL (ref 3.87–5.11)
RDW: 13.3 % (ref 11.5–15.5)
WBC Count: 5.4 10*3/uL (ref 4.0–10.5)
nRBC: 0 % (ref 0.0–0.2)

## 2022-06-28 LAB — CMP (CANCER CENTER ONLY)
ALT: 27 U/L (ref 0–44)
AST: 28 U/L (ref 15–41)
Albumin: 4 g/dL (ref 3.5–5.0)
Alkaline Phosphatase: 98 U/L (ref 38–126)
Anion gap: 5 (ref 5–15)
BUN: 16 mg/dL (ref 6–20)
CO2: 28 mmol/L (ref 22–32)
Calcium: 9.5 mg/dL (ref 8.9–10.3)
Chloride: 104 mmol/L (ref 98–111)
Creatinine: 0.89 mg/dL (ref 0.44–1.00)
GFR, Estimated: >60 mL/min (ref >60)
Glucose, Bld: 93 mg/dL (ref 70–99)
Potassium: 3.9 mmol/L (ref 3.5–5.1)
Sodium: 137 mmol/L (ref 135–145)
Total Bilirubin: 0.2 mg/dL — ABNORMAL LOW (ref 0.3–1.2)
Total Protein: 7.3 g/dL (ref 6.5–8.1)

## 2022-06-28 LAB — TSH: TSH: 0.909 u[IU]/mL (ref 0.350–4.500)

## 2022-06-28 MED ORDER — DIPHENHYDRAMINE HCL 50 MG/ML IJ SOLN
INTRAMUSCULAR | Status: AC
  Filled 2022-06-28: qty 1

## 2022-06-28 MED ORDER — DIPHENHYDRAMINE HCL 50 MG/ML IJ SOLN
25.0000 mg | Freq: Once | INTRAMUSCULAR | Status: AC
  Administered 2022-06-28: 25 mg via INTRAVENOUS

## 2022-06-28 MED ORDER — HEPARIN SOD (PORK) LOCK FLUSH 100 UNIT/ML IV SOLN
500.0000 [IU] | Freq: Once | INTRAVENOUS | Status: AC | PRN
  Administered 2022-06-28: 500 [IU]

## 2022-06-28 MED ORDER — SODIUM CHLORIDE 0.9% FLUSH
10.0000 mL | INTRAVENOUS | Status: DC | PRN
  Administered 2022-06-28: 10 mL

## 2022-06-28 MED ORDER — FAMOTIDINE IN NACL 20-0.9 MG/50ML-% IV SOLN
INTRAVENOUS | Status: AC
  Filled 2022-06-28: qty 50

## 2022-06-28 MED ORDER — FAMOTIDINE IN NACL 20-0.9 MG/50ML-% IV SOLN
20.0000 mg | Freq: Once | INTRAVENOUS | Status: AC
  Administered 2022-06-28: 20 mg via INTRAVENOUS

## 2022-06-28 MED ORDER — SODIUM CHLORIDE 0.9% FLUSH
10.0000 mL | Freq: Once | INTRAVENOUS | Status: AC
  Administered 2022-06-28: 10 mL

## 2022-06-28 MED ORDER — SODIUM CHLORIDE 0.9 % IV SOLN: Freq: Once | INTRAVENOUS | Status: AC

## 2022-06-28 MED ORDER — SODIUM CHLORIDE 0.9 % IV SOLN
80.0000 mg/m2 | Freq: Once | INTRAVENOUS | Status: AC
  Administered 2022-06-28: 198 mg via INTRAVENOUS
  Filled 2022-06-28: qty 33

## 2022-06-28 NOTE — Progress Notes (Signed)
[facility]   [phone] [phone]   Clinic Follow up Note   Patient Care Team: Gabrielle Sailors, PA as PCP - General (Physician Assistant) Gabrielle Germany, RN as Oncology Nurse Navigator Gabrielle Kaufmann, RN as Oncology Nurse Navigator Gabrielle Bookbinder, MD as Consulting Physician (General Surgery) Gabrielle Merle, MD as Consulting Physician (Hematology) Gabrielle Rudd, MD as Consulting Physician (Radiation Oncology) Gabrielle Maudlin, MD as Consulting Physician (Orthopedic Surgery)  Date of Service:  06/28/2022  CHIEF COMPLAINT: f/u of left breast cancer  CURRENT THERAPY:  Adjuvant carboplatin/taxol, weekly, starting 06/14/22 Gabrielle [name], starting 06/14/22  ASSESSMENT & PLAN:  Gabrielle [name] is a 31 y.o. pre-menopausal female with   1. 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 Biopsy 03/11/22 showed metaplastic carcinoma. -baseline echo 03/25/22 was normal (EF 60-65%). -staging CT CAP and bone scan 03/26/22 negative for disease outside of the breast. 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 SLN biopsy on 05/14/22 was negative (0/4). -given large tumor size and young age, adjuvant chemo is recommended. Plan for 3 months of weekly carboplatin/taxol, followed by 4 cycles of AC every 3 weeks, with Gabrielle [name]  -she started zoladex on 06/03/22. -she began chemotherapy with carboplatin/taxol and Keytruda on 06/14/22. There was some discrepancy about her port placement, but we were able to use for week 2. -labs reviewed, she has developed some mild anemia, hgb 10.9. I suggested she start a prenatal vitamin; we will monitor. Otherwise adequate to proceed with week 3 today as scheduled.    2. Anxiety, Insomnia -she reports a history of anxiety,  relieved with marijuana -I prescribed Ambien on 04/10/22 for her to use as needed.   3. Family History, Genetics -she has a strong family history of breast cancer, in both sides of her family. 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     Plan -proceed with week 3 carbo/taxol today. GCSF  daily for 3 days as scheduled  -lab, flush, and taxol weekly              -Keytruda every 3 weeks, next in 1 week -f/u every other week   No problem-specific Assessment & Plan notes found for this encounter.   SUMMARY OF ONCOLOGIC HISTORY: Oncology History Overview Note   Cancer Staging  Malignant neoplasm of upper-outer quadrant of left breast in female, estrogen receptor negative ([employer]) Staging form: Breast, AJCC 8th Edition - Clinical stage from 03/11/2022: Stage IIB (cT2, cN0, cM0, G3, ER-, PR-, HER2-) - Signed by Gabrielle Merle, MD on 03/19/2022    Malignant neoplasm of upper-outer quadrant of left breast in female, estrogen receptor negative ([place])  03/08/2022 Mammogram   CLINICAL DATA:  31 year old female presenting for evaluation of a palpable lump in the left breast which she feels has increased in size since she first identified it. Her mother was diagnosed with breast cancer within the last 6 months. She also has family history of breast cancer in multiple aunts and her maternal grandmother.   EXAM: DIGITAL DIAGNOSTIC BILATERAL MAMMOGRAM WITH TOMOSYNTHESIS AND CAD; ULTRASOUND LEFT BREAST LIMITED  IMPRESSION: 1. There is a suspicious 3.7 cm mass in the left breast at 12 o'clock.   2.  No evidence of left axillary lymphadenopathy.   3.  No evidence of malignancy in  the right breast.   03/11/2022 Cancer Staging   Staging form: Breast, AJCC 8th Edition - Clinical stage from 03/11/2022: Stage IIB (cT2, cN0, cM0, G3, ER-, PR-, HER2-) - Signed by Gabrielle Merle, MD on 03/19/2022 Stage prefix: Initial diagnosis Histologic grading system: 3  grade system   03/11/2022 Initial Biopsy   Diagnosis Breast, left, needle core biopsy, 12:00 - METAPLASTIC CARCINOMA - SEE COMMENT  Microscopic Comment The biopsy has an invasive epithelial component admixed with chondroid deposition, consistent with a metaplastic carcinoma. Based on the biopsy, the carcinoma appears Nottingham grade 3 of 3 and measures 1.2 cm in greatest linear extent.  PROGNOSTIC INDICATORS Results: The tumor cells are NEGATIVE for Her2 (0). Estrogen Receptor: 0%, NEGATIVE Progesterone Receptor: 0%, NEGATIVE Proliferation Marker Ki67: 40%   03/15/2022 Initial Diagnosis   Malignant neoplasm of upper-outer quadrant of left breast in female, estrogen receptor negative ([place])   03/28/2022 Genetic Testing   Negative hereditary cancer genetic testing: no pathogenic variants detected in [facility] BRCAPlus Panel or [facility] CustomNext-Cancer +RNAinsight Panel.  Report dates are 03/28/2022 and 03/31/2022. Marland Kitchen   The BRCAplus panel offered by [employer] and includes sequencing and deletion/duplication analysis for the following 8 genes: ATM, BRCA1, BRCA2, CDH1, CHEK2, PALB2, PTEN, and TP53.  The CustomNext-Cancer+RNAinsight panel offered by [facility] includes sequencing and rearrangement analysis for the following 47 genes:  APC, ATM, AXIN2, BARD1, BMPR1A, BRCA1, BRCA2, BRIP1, CDH1, CDK4, CDKN2A, CHEK2, DICER1, EPCAM, GREM1, HOXB13, MEN1, MLH1, MSH2, MSH3, MSH6, MUTYH, NBN, NF1, NF2, NTHL1, PALB2, PMS2, POLD1, POLE, PTEN, RAD51C, RAD51D, RECQL, RET, SDHA, SDHAF2, SDHB, SDHC, SDHD, SMAD4, SMARCA4, STK11, TP53, TSC1, TSC2, and VHL.  RNA data is routinely analyzed for use in variant interpretation for all genes.   05/14/2022 Cancer Staging   Staging form: Breast, AJCC 8th Edition - Pathologic stage from 05/14/2022: Stage IIA (pT2, pN0, cM0, G3, ER-, PR-, HER2-) - Signed by Gabrielle Merle, MD on 05/28/2022 Stage prefix: Initial diagnosis Histologic grading system: 3 grade system Residual tumor  (R): R0 - None   06/14/2022 -  Chemotherapy   Patient is on Treatment Plan : BREAST Pembrolizumab (200) D1 + Carboplatin (5) D1 + Paclitaxel (80) D1,8,15 q21d X 4 cycles / Pembrolizumab (200) D1 + AC D1 q21d x 4 cycles        INTERVAL HISTORY:  Gabrielle [name] is here for a follow up of breast cancer. She was last seen by me on 06/14/22. She presents to the clinic alone. She reports she is doing better today. She reports she felt very bad after first infusion that evening through that Sunday. She explains she recovers by Tuesday. She notes she better better after second week, indicating her side effects are likely from [place]. She reports some pain to the inside of her left arm, unsure of the cause. She also notes more irritation and some hot flashes.   All other systems were reviewed with the patient and are negative.  MEDICAL HISTORY:  Past Medical History:  Diagnosis Date   Arthritis    Bilateral Knees   Cancer ([place]) 03/11/2022   Breast   Family history of breast cancer 03/21/2022   Family history of prostate cancer 03/21/2022   Hypercholesteremia     SURGICAL HISTORY: Past Surgical History:  Procedure Laterality Date   AXILLARY SENTINEL NODE BIOPSY Left 05/14/2022   Procedure: LEFT AXILLARY SENTINEL NODE BIOPSY;  Surgeon: Gabrielle Bookbinder, MD;  Location: [facility];  Service: General;  Laterality: Left;  GEN w/ PEC BLOCK  BREAST BIOPSY Left 03/11/2022   BREAST LUMPECTOMY WITH AXILLARY LYMPH NODE BIOPSY Left 04/16/2022   Procedure: LEFT BREAST LUMPECTOMY WITH LEFT AXILLARY SENTINEL LYMPH NODE BIOPSY;  Surgeon: Gabrielle Bookbinder, MD;  Location: [facility];  Service: General;  Laterality: Left;   DENTAL SURGERY     PORTACATH PLACEMENT Right 04/16/2022   Procedure: INSERTION PORT-A-CATH;  Surgeon: Gabrielle Bookbinder, MD;  Location: [facility];  Service: General;  Laterality: Right;    I have reviewed the social history and family history with the  patient and they are unchanged from previous note.  ALLERGIES:  has No Known Allergies.  MEDICATIONS:  Current Outpatient Medications  Medication Sig Dispense Refill   acetaminophen (TYLENOL) 500 MG tablet Take 1,000 mg by mouth every 6 (six) hours as needed (pain.).     ibuprofen (ADVIL) 200 MG tablet Take 200 mg by mouth every 8 (eight) hours as needed (pain).     lidocaine-prilocaine (EMLA) cream Apply to affected area once 30 g 3   ondansetron (ZOFRAN) 8 MG tablet Take 1 tablet (8 mg total) by mouth 2 (two) times daily as needed. Start on the third day after carboplatin and AC chemotherapy. 30 tablet 1   oxyCODONE (OXY IR/ROXICODONE) 5 MG immediate release tablet Take 1 tablet (5 mg total) by mouth every 6 (six) hours as needed. 10 tablet 0   oxyCODONE (OXY IR/ROXICODONE) 5 MG immediate release tablet Take 1 tablet (5 mg total) by mouth every 6 (six) hours as needed for moderate pain, severe pain or breakthrough pain. 10 tablet 0   prochlorperazine (COMPAZINE) 10 MG tablet Take 1 tablet (10 mg total) by mouth every 6 (six) hours as needed for nausea or vomiting. 30 tablet 0   prochlorperazine (COMPAZINE) 10 MG tablet Take 1 tablet (10 mg total) by mouth every 6 (six) hours as needed (Nausea or vomiting). 30 tablet 1   traMADol (ULTRAM) 50 MG tablet Take 50 mg by mouth 2 (two) times daily as needed for pain.     zolpidem (AMBIEN) 5 MG tablet Take 1 tablet (5 mg total) by mouth at bedtime as needed for sleep. 20 tablet 0   No current facility-administered medications for this visit.   Facility-Administered Medications Ordered in Other Visits  Medication Dose Route Frequency Provider Last Rate Last Admin   sodium chloride flush (NS) 0.9 % injection 10 mL  10 mL Intracatheter PRN Gabrielle Merle, MD   10 mL at 06/28/22 1207    PHYSICAL EXAMINATION: ECOG PERFORMANCE STATUS: 1 - Symptomatic but completely ambulatory  Vitals:   06/28/22 0921  BP: 134/74  Pulse: 68  Resp: 15  Temp: 98.1 F  (36.7 C)  SpO2: 100%   Wt Readings from Last 3 Encounters:  06/28/22 278 lb 8 oz (126.3 kg)  06/21/22 275 lb 12 oz (125.1 kg)  06/14/22 276 lb 1 oz (125.2 kg)     GENERAL:alert, no distress and comfortable SKIN: skin color, texture, turgor are normal, no rashes or significant lesions EYES: normal, Conjunctiva are pink and non-injected, sclera clear  Musculoskeletal:no cyanosis of digits and no clubbing  NEURO: alert & oriented x 3 with fluent speech, no focal motor/sensory deficits BREAST: No palpable mass, nodules or adenopathy bilaterally. Breast exam benign.   LABORATORY DATA:  I have reviewed the data as listed    Latest Ref Rng & Units 06/28/2022    9:00 AM 06/21/2022    1:15 PM 06/14/2022   10:27 AM  CBC  WBC  4.0 - 10.5 K/uL 5.4  6.7  9.1   Hemoglobin 12.0 - 15.0 g/dL 10.9  11.2  12.6   Hematocrit 36.0 - 46.0 % 32.6  33.7  37.5   Platelets 150 - 400 K/uL 234  247  295         Latest Ref Rng & Units 06/28/2022    9:00 AM 06/21/2022    1:15 PM 06/14/2022   10:27 AM  CMP  Glucose 70 - 99 mg/dL 93  93  107   BUN 6 - 20 mg/dL '16  11  15   ' Creatinine 0.44 - 1.00 mg/dL 0.89  0.79  1.00   Sodium 135 - 145 mmol/L 137  136  138   Potassium 3.5 - 5.1 mmol/L 3.9  3.5  3.7   Chloride 98 - 111 mmol/L 104  103  107   CO2 22 - 32 mmol/L '28  28  26   ' Calcium 8.9 - 10.3 mg/dL 9.5  9.5  9.7   Total Protein 6.5 - 8.1 g/dL 7.3  7.6  7.1   Total Bilirubin 0.3 - 1.2 mg/dL 0.2  0.3  0.4   Alkaline Phos 38 - 126 U/L 98  84  74   AST 15 - 41 U/L '28  18  14   ' ALT 0 - 44 U/L '27  11  9       ' RADIOGRAPHIC STUDIES: I have personally reviewed the radiological images as listed and agreed with the findings in the report. No results found.    No orders of the defined types were placed in this encounter.  All questions were answered. The patient knows to call the clinic with any problems, questions or concerns. No barriers to learning was detected. The total time spent in the appointment was  30 minutes.     Gabrielle Merle, MD 06/28/2022   I, Wilburn Mylar, am acting as scribe for Gabrielle Merle, MD.   I have reviewed the above documentation for accuracy and completeness, and I agree with the above.

## 2022-06-28 NOTE — Patient Instructions (Signed)
[facility]   Discharge Instructions: Thank you for choosing [facility] to provide your oncology and hematology care.   If you have a lab appointment with the [facility], please go directly to the [facility] and check in at the registration area.   Wear comfortable clothing and clothing appropriate for easy access to any Portacath or PICC line.   We strive to give you quality time with your provider. You may need to reschedule your appointment if you arrive late (15 or more minutes).  Arriving late affects you and other patients whose appointments are after yours.  Also, if you miss three or more appointments without notifying the office, you may be dismissed from the clinic at the provider's discretion.      For prescription refill requests, have your pharmacy contact our office and allow 72 hours for refills to be completed.    Today you received the following chemotherapy and/or immunotherapy agents: Paclitaxel   To help prevent nausea and vomiting after your treatment, we encourage you to take your nausea medication as directed.  BELOW ARE SYMPTOMS THAT SHOULD BE REPORTED IMMEDIATELY: *FEVER GREATER THAN 100.4 F (38 C) OR HIGHER *CHILLS OR SWEATING *NAUSEA AND VOMITING THAT IS NOT CONTROLLED WITH YOUR NAUSEA MEDICATION *UNUSUAL SHORTNESS OF BREATH *UNUSUAL BRUISING OR BLEEDING *URINARY PROBLEMS (pain or burning when urinating, or frequent urination) *BOWEL PROBLEMS (unusual diarrhea, constipation, pain near the anus) TENDERNESS IN MOUTH AND THROAT WITH OR WITHOUT PRESENCE OF ULCERS (sore throat, sores in mouth, or a toothache) UNUSUAL RASH, SWELLING OR PAIN  UNUSUAL VAGINAL DISCHARGE OR ITCHING   Items with * indicate a potential emergency and should be followed up as soon as possible or go to the Emergency Department if any problems should occur.  Please show the CHEMOTHERAPY ALERT CARD or IMMUNOTHERAPY ALERT CARD at check-in to  the Emergency Department and triage nurse.  Should you have questions after your visit or need to cancel or reschedule your appointment, please contact [facility]  Dept: [phone]  and follow the prompts.  Office hours are 8:00 a.m. to 4:30 p.m. Monday - Friday. Please note that voicemails left after 4:00 p.m. may not be returned until the following business day.  We are closed weekends and major holidays. You have access to a nurse at all times for urgent questions. Please call the main number to the clinic Dept: [phone] and follow the prompts.   For any non-urgent questions, you may also contact your provider using MyChart. We now offer e-Visits for anyone 78 and older to request care online for non-urgent symptoms. For details visit [email or web address].   Also download the MyChart app! Go to the app store, search "MyChart", open the app, select [facility], and log in with your MyChart username and password.  Masks are optional in the cancer centers. If you would like for your care team to wear a mask while they are taking care of you, please let them know. For doctor visits, patients may have with them one support person who is at least 32 years old. At this time, visitors are not allowed in the infusion area.

## 2022-06-29 ENCOUNTER — Other Ambulatory Visit: Payer: Self-pay

## 2022-06-29 ENCOUNTER — Inpatient Hospital Stay: Payer: Medicaid Other

## 2022-06-29 VITALS — BP 113/67 | HR 80 | Temp 98.6°F | Resp 15

## 2022-06-29 DIAGNOSIS — Z5111 Encounter for antineoplastic chemotherapy: Secondary | ICD-10-CM | POA: Diagnosis not present

## 2022-06-29 DIAGNOSIS — C50412 Malignant neoplasm of upper-outer quadrant of left female breast: Secondary | ICD-10-CM

## 2022-06-29 MED ORDER — FILGRASTIM-AAFI 480 MCG/0.8ML IJ SOSY
480.0000 ug | PREFILLED_SYRINGE | Freq: Once | INTRAMUSCULAR | Status: AC
  Administered 2022-06-29: 480 ug via SUBCUTANEOUS

## 2022-06-29 NOTE — Patient Instructions (Signed)
Filgrastim Injection What is this medication? FILGRASTIM (fil GRA stim) lowers the risk of infection in people who are receiving chemotherapy. It works by helping your body make more white blood cells, which protects your body from infection. It may also be used to help people who have been exposed to high doses of radiation. It can be used to help prepare your body before a stem cell transplant. It works by helping your bone marrow make and release stem cells into the blood. This medicine may be used for other purposes; ask your health care provider or pharmacist if you have questions. COMMON BRAND NAME(S): Neupogen, Nivestym, Releuko, Zarxio What should I tell my care team before I take this medication? They need to know if you have any of these conditions: History of blood diseases, such as sickle cell anemia Kidney disease Recent or ongoing radiation An unusual or allergic reaction to filgrastim, pegfilgrastim, latex, rubber, other medications, foods, dyes, or preservatives Pregnant or trying to get pregnant Breast-feeding How should I use this medication? This medication is injected under the skin or into a vein. It is usually given by your care team in a hospital or clinic setting. It may be given at home. If you get this medication at home, you will be taught how to prepare and give it. Use exactly as directed. Take it as directed on the prescription label at the same time every day. Keep taking it unless your care team tells you to stop. It is important that you put your used needles and syringes in a special sharps container. Do not put them in a trash can. If you do not have a sharps container, call your pharmacist or care team to get one. This medication comes with INSTRUCTIONS FOR USE. Ask your pharmacist for directions on how to use this medication. Read the information carefully. Talk to your pharmacist or care team if you have questions. Talk to your care team about the use of this  medication in children. While it may be prescribed for children for selected conditions, precautions do apply. Overdosage: If you think you have taken too much of this medicine contact a poison control center or emergency room at once. NOTE: This medicine is only for you. Do not share this medicine with others. What if I miss a dose? It is important not to miss any doses. Talk to your care team about what to do if you miss a dose. What may interact with this medication? Medications that may cause a release of neutrophils, such as lithium This list may not describe all possible interactions. Give your health care provider a list of all the medicines, herbs, non-prescription drugs, or dietary supplements you use. Also tell them if you smoke, drink alcohol, or use illegal drugs. Some items may interact with your medicine. What should I watch for while using this medication? Your condition will be monitored carefully while you are receiving this medication. You may need bloodwork while taking this medication. Talk to your care team about your risk of cancer. You may be more at risk for certain types of cancer if you take this medication. What side effects may I notice from receiving this medication? Side effects that you should report to your care team as soon as possible: Allergic reactions--skin rash, itching, hives, swelling of the face, lips, tongue, or throat Capillary leak syndrome--stomach or muscle pain, unusual weakness or fatigue, feeling faint or lightheaded, decrease in the amount of urine, swelling of the ankles, hands, or   feet, trouble breathing High white blood cell level--fever, fatigue, trouble breathing, night sweats, change in vision, weight loss Inflammation of the aorta--fever, fatigue, back, chest, or stomach pain, severe headache Kidney injury (glomerulonephritis)--decrease in the amount of urine, red or dark brown urine, foamy or bubbly urine, swelling of the ankles, hands, or  feet Shortness of breath or trouble breathing Spleen injury--pain in upper left stomach or shoulder Unusual bruising or bleeding Side effects that usually do not require medical attention (report to your care team if they continue or are bothersome): Back pain Bone pain Fatigue Fever Headache Nausea This list may not describe all possible side effects. Call your doctor for medical advice about side effects. You may report side effects to FDA at 1-800-FDA-1088. Where should I keep my medication? Keep out of the reach of children and pets. Keep this medication in the original packaging until you are ready to take it. Protect from light. See product for storage information. Each product may have different instructions. Get rid of any unused medication after the expiration date. To get rid of medications that are no longer needed or have expired: Take the medication to a medications take-back program. Check with your pharmacy or law enforcement to find a location. If you cannot return the medication, ask your pharmacist or care team how to get rid of this medication safely. NOTE: This sheet is a summary. It may not cover all possible information. If you have questions about this medicine, talk to your doctor, pharmacist, or health care provider.  2023 Elsevier/Gold Standard (2022-02-12 00:00:00)  

## 2022-06-30 LAB — T4: T4, Total: 8.4 ug/dL (ref 4.5–12.0)

## 2022-07-01 ENCOUNTER — Inpatient Hospital Stay: Payer: Medicaid Other

## 2022-07-01 ENCOUNTER — Other Ambulatory Visit: Payer: Self-pay

## 2022-07-01 VITALS — BP 96/70 | HR 86 | Temp 98.1°F | Resp 16

## 2022-07-01 DIAGNOSIS — Z5111 Encounter for antineoplastic chemotherapy: Secondary | ICD-10-CM | POA: Diagnosis not present

## 2022-07-01 DIAGNOSIS — C50412 Malignant neoplasm of upper-outer quadrant of left female breast: Secondary | ICD-10-CM

## 2022-07-01 MED ORDER — FILGRASTIM-AAFI 480 MCG/0.8ML IJ SOSY
480.0000 ug | PREFILLED_SYRINGE | Freq: Once | INTRAMUSCULAR | Status: AC
  Administered 2022-07-01: 480 ug via SUBCUTANEOUS
  Filled 2022-07-01: qty 0.8

## 2022-07-02 ENCOUNTER — Encounter: Payer: Medicaid Other | Admitting: Rehabilitation

## 2022-07-02 ENCOUNTER — Inpatient Hospital Stay: Payer: Medicaid Other

## 2022-07-02 VITALS — BP 126/82 | HR 83 | Resp 18

## 2022-07-02 DIAGNOSIS — Z95828 Presence of other vascular implants and grafts: Secondary | ICD-10-CM

## 2022-07-02 DIAGNOSIS — Z171 Estrogen receptor negative status [ER-]: Secondary | ICD-10-CM

## 2022-07-02 DIAGNOSIS — Z5111 Encounter for antineoplastic chemotherapy: Secondary | ICD-10-CM | POA: Diagnosis not present

## 2022-07-02 MED ORDER — FILGRASTIM-AAFI 480 MCG/0.8ML IJ SOSY
480.0000 ug | PREFILLED_SYRINGE | Freq: Once | INTRAMUSCULAR | Status: AC
  Administered 2022-07-02: 480 ug via SUBCUTANEOUS
  Filled 2022-07-02: qty 0.8

## 2022-07-02 MED ORDER — GOSERELIN ACETATE 3.6 MG ~~LOC~~ IMPL
3.6000 mg | DRUG_IMPLANT | Freq: Once | SUBCUTANEOUS | Status: AC
  Administered 2022-07-02: 3.6 mg via SUBCUTANEOUS
  Filled 2022-07-02: qty 3.6

## 2022-07-04 ENCOUNTER — Encounter: Payer: Medicaid Other | Admitting: Rehabilitation

## 2022-07-04 MED FILL — Fosaprepitant Dimeglumine For IV Infusion 150 MG (Base Eq): INTRAVENOUS | Qty: 5 | Status: AC

## 2022-07-05 ENCOUNTER — Inpatient Hospital Stay: Payer: Medicaid Other

## 2022-07-05 ENCOUNTER — Other Ambulatory Visit: Payer: Self-pay

## 2022-07-05 ENCOUNTER — Inpatient Hospital Stay: Payer: Medicaid Other | Admitting: Hematology

## 2022-07-05 VITALS — BP 117/75 | HR 76 | Temp 98.4°F | Resp 16 | Wt 274.0 lb

## 2022-07-05 DIAGNOSIS — Z95828 Presence of other vascular implants and grafts: Secondary | ICD-10-CM

## 2022-07-05 DIAGNOSIS — Z171 Estrogen receptor negative status [ER-]: Secondary | ICD-10-CM

## 2022-07-05 DIAGNOSIS — Z5111 Encounter for antineoplastic chemotherapy: Secondary | ICD-10-CM | POA: Diagnosis not present

## 2022-07-05 LAB — CBC WITH DIFFERENTIAL (CANCER CENTER ONLY)
Abs Immature Granulocytes: 0.15 10*3/uL — ABNORMAL HIGH (ref 0.00–0.07)
Basophils Absolute: 0.1 10*3/uL (ref 0.0–0.1)
Basophils Relative: 1 %
Eosinophils Absolute: 0 10*3/uL (ref 0.0–0.5)
Eosinophils Relative: 0 %
HCT: 33.4 % — ABNORMAL LOW (ref 36.0–46.0)
Hemoglobin: 11.1 g/dL — ABNORMAL LOW (ref 12.0–15.0)
Immature Granulocytes: 2 %
Lymphocytes Relative: 38 %
Lymphs Abs: 2.5 10*3/uL (ref 0.7–4.0)
MCH: 26.8 pg (ref 26.0–34.0)
MCHC: 33.2 g/dL (ref 30.0–36.0)
MCV: 80.7 fL (ref 80.0–100.0)
Monocytes Absolute: 0.7 10*3/uL (ref 0.1–1.0)
Monocytes Relative: 10 %
Neutro Abs: 3.2 10*3/uL (ref 1.7–7.7)
Neutrophils Relative %: 49 %
Platelet Count: 264 10*3/uL (ref 150–400)
RBC: 4.14 MIL/uL (ref 3.87–5.11)
RDW: 14.4 % (ref 11.5–15.5)
WBC Count: 6.6 10*3/uL (ref 4.0–10.5)
nRBC: 0 % (ref 0.0–0.2)

## 2022-07-05 LAB — CMP (CANCER CENTER ONLY)
ALT: 17 U/L (ref 0–44)
AST: 16 U/L (ref 15–41)
Albumin: 4.2 g/dL (ref 3.5–5.0)
Alkaline Phosphatase: 105 U/L (ref 38–126)
Anion gap: 3 — ABNORMAL LOW (ref 5–15)
BUN: 16 mg/dL (ref 6–20)
CO2: 29 mmol/L (ref 22–32)
Calcium: 10.3 mg/dL (ref 8.9–10.3)
Chloride: 104 mmol/L (ref 98–111)
Creatinine: 0.89 mg/dL (ref 0.44–1.00)
GFR, Estimated: >60 mL/min (ref >60)
Glucose, Bld: 90 mg/dL (ref 70–99)
Potassium: 3.8 mmol/L (ref 3.5–5.1)
Sodium: 136 mmol/L (ref 135–145)
Total Bilirubin: 0.3 mg/dL (ref 0.3–1.2)
Total Protein: 7.4 g/dL (ref 6.5–8.1)

## 2022-07-05 MED ORDER — SODIUM CHLORIDE 0.9% FLUSH
10.0000 mL | INTRAVENOUS | Status: DC | PRN
  Administered 2022-07-05: 10 mL

## 2022-07-05 MED ORDER — FAMOTIDINE IN NACL 20-0.9 MG/50ML-% IV SOLN
20.0000 mg | Freq: Once | INTRAVENOUS | Status: AC
  Administered 2022-07-05: 20 mg via INTRAVENOUS
  Filled 2022-07-05: qty 50

## 2022-07-05 MED ORDER — PROCHLORPERAZINE MALEATE 10 MG PO TABS: 10.0000 mg | ORAL_TABLET | Freq: Four times a day (QID) | ORAL | 1 refills | Status: DC | PRN

## 2022-07-05 MED ORDER — ONDANSETRON HCL 8 MG PO TABS: 8.0000 mg | ORAL_TABLET | Freq: Two times a day (BID) | ORAL | 1 refills | Status: DC | PRN

## 2022-07-05 MED ORDER — SODIUM CHLORIDE 0.9 % IV SOLN
750.0000 mg | Freq: Once | INTRAVENOUS | Status: AC
  Administered 2022-07-05: 750 mg via INTRAVENOUS
  Filled 2022-07-05: qty 75

## 2022-07-05 MED ORDER — PALONOSETRON HCL INJECTION 0.25 MG/5ML
0.2500 mg | Freq: Once | INTRAVENOUS | Status: AC
  Administered 2022-07-05: 0.25 mg via INTRAVENOUS
  Filled 2022-07-05: qty 5

## 2022-07-05 MED ORDER — DIPHENHYDRAMINE HCL 50 MG/ML IJ SOLN
25.0000 mg | Freq: Once | INTRAMUSCULAR | Status: AC
  Administered 2022-07-05: 25 mg via INTRAVENOUS
  Filled 2022-07-05: qty 1

## 2022-07-05 MED ORDER — SODIUM CHLORIDE 0.9 % IV SOLN: Freq: Once | INTRAVENOUS | Status: AC

## 2022-07-05 MED ORDER — SODIUM CHLORIDE 0.9 % IV SOLN
80.0000 mg/m2 | Freq: Once | INTRAVENOUS | Status: AC
  Administered 2022-07-05: 198 mg via INTRAVENOUS
  Filled 2022-07-05: qty 33

## 2022-07-05 MED ORDER — HEPARIN SOD (PORK) LOCK FLUSH 100 UNIT/ML IV SOLN
500.0000 [IU] | Freq: Once | INTRAVENOUS | Status: AC | PRN
  Administered 2022-07-05: 500 [IU]

## 2022-07-05 MED ORDER — SODIUM CHLORIDE 0.9 % IV SOLN
150.0000 mg | Freq: Once | INTRAVENOUS | Status: AC
  Administered 2022-07-05: 150 mg via INTRAVENOUS
  Filled 2022-07-05: qty 150

## 2022-07-05 MED ORDER — SODIUM CHLORIDE 0.9 % IV SOLN
200.0000 mg | Freq: Once | INTRAVENOUS | Status: AC
  Administered 2022-07-05: 200 mg via INTRAVENOUS
  Filled 2022-07-05: qty 200

## 2022-07-05 MED ORDER — SODIUM CHLORIDE 0.9% FLUSH
10.0000 mL | Freq: Once | INTRAVENOUS | Status: AC
  Administered 2022-07-05: 10 mL

## 2022-07-05 NOTE — Progress Notes (Signed)
Ok to proceed today using results from last CMP per Dr Burr Medico.

## 2022-07-05 NOTE — Patient Instructions (Signed)
[facility]   Discharge Instructions: Thank you for choosing [facility] to provide your oncology and hematology care.   If you have a lab appointment with the [facility], please go directly to the [facility] and check in at the registration area.   Wear comfortable clothing and clothing appropriate for easy access to any Portacath or PICC line.   We strive to give you quality time with your provider. You may need to reschedule your appointment if you arrive late (15 or more minutes).  Arriving late affects you and other patients whose appointments are after yours.  Also, if you miss three or more appointments without notifying the office, you may be dismissed from the clinic at the provider's discretion.      For prescription refill requests, have your pharmacy contact our office and allow 72 hours for refills to be completed.    Today you received the following chemotherapy and/or immunotherapy agents: pembrolizumab, paclitaxel, carboplatin      To help prevent nausea and vomiting after your treatment, we encourage you to take your nausea medication as directed.  BELOW ARE SYMPTOMS THAT SHOULD BE REPORTED IMMEDIATELY: *FEVER GREATER THAN 100.4 F (38 C) OR HIGHER *CHILLS OR SWEATING *NAUSEA AND VOMITING THAT IS NOT CONTROLLED WITH YOUR NAUSEA MEDICATION *UNUSUAL SHORTNESS OF BREATH *UNUSUAL BRUISING OR BLEEDING *URINARY PROBLEMS (pain or burning when urinating, or frequent urination) *BOWEL PROBLEMS (unusual diarrhea, constipation, pain near the anus) TENDERNESS IN MOUTH AND THROAT WITH OR WITHOUT PRESENCE OF ULCERS (sore throat, sores in mouth, or a toothache) UNUSUAL RASH, SWELLING OR PAIN  UNUSUAL VAGINAL DISCHARGE OR ITCHING   Items with * indicate a potential emergency and should be followed up as soon as possible or go to the Emergency Department if any problems should occur.  Please show the CHEMOTHERAPY ALERT CARD or  IMMUNOTHERAPY ALERT CARD at check-in to the Emergency Department and triage nurse.  Should you have questions after your visit or need to cancel or reschedule your appointment, please contact [facility]  Dept: [phone]  and follow the prompts.  Office hours are 8:00 a.m. to 4:30 p.m. Monday - Friday. Please note that voicemails left after 4:00 p.m. may not be returned until the following business day.  We are closed weekends and major holidays. You have access to a nurse at all times for urgent questions. Please call the main number to the clinic Dept: [phone] and follow the prompts.   For any non-urgent questions, you may also contact your provider using MyChart. We now offer e-Visits for anyone 11 and older to request care online for non-urgent symptoms. For details visit [email or web address].   Also download the MyChart app! Go to the app store, search "MyChart", open the app, select [facility], and log in with your MyChart username and password.  Masks are optional in the cancer centers. If you would like for your care team to wear a mask while they are taking care of you, please let them know. You may have one support person who is at least 32 years old accompany you for your appointments.

## 2022-07-09 ENCOUNTER — Encounter: Payer: Medicaid Other | Admitting: Rehabilitation

## 2022-07-11 ENCOUNTER — Inpatient Hospital Stay (HOSPITAL_BASED_OUTPATIENT_CLINIC_OR_DEPARTMENT_OTHER): Payer: Medicaid Other | Admitting: Hematology

## 2022-07-11 ENCOUNTER — Inpatient Hospital Stay: Payer: Medicaid Other

## 2022-07-11 ENCOUNTER — Encounter: Payer: Self-pay | Admitting: Hematology

## 2022-07-11 ENCOUNTER — Other Ambulatory Visit: Payer: Self-pay

## 2022-07-11 VITALS — BP 114/75 | HR 76 | Temp 98.7°F | Resp 18 | Ht 70.0 in | Wt 274.6 lb

## 2022-07-11 DIAGNOSIS — Z171 Estrogen receptor negative status [ER-]: Secondary | ICD-10-CM

## 2022-07-11 DIAGNOSIS — C50412 Malignant neoplasm of upper-outer quadrant of left female breast: Secondary | ICD-10-CM

## 2022-07-11 DIAGNOSIS — Z5111 Encounter for antineoplastic chemotherapy: Secondary | ICD-10-CM | POA: Diagnosis not present

## 2022-07-11 DIAGNOSIS — Z95828 Presence of other vascular implants and grafts: Secondary | ICD-10-CM

## 2022-07-11 LAB — CMP (CANCER CENTER ONLY)
ALT: 19 U/L (ref 0–44)
AST: 20 U/L (ref 15–41)
Albumin: 4.3 g/dL (ref 3.5–5.0)
Alkaline Phosphatase: 102 U/L (ref 38–126)
Anion gap: 5 (ref 5–15)
BUN: 15 mg/dL (ref 6–20)
CO2: 29 mmol/L (ref 22–32)
Calcium: 10.1 mg/dL (ref 8.9–10.3)
Chloride: 102 mmol/L (ref 98–111)
Creatinine: 0.72 mg/dL (ref 0.44–1.00)
GFR, Estimated: >60 mL/min (ref >60)
Glucose, Bld: 96 mg/dL (ref 70–99)
Potassium: 3.7 mmol/L (ref 3.5–5.1)
Sodium: 136 mmol/L (ref 135–145)
Total Bilirubin: 0.3 mg/dL (ref 0.3–1.2)
Total Protein: 7.6 g/dL (ref 6.5–8.1)

## 2022-07-11 LAB — CBC WITH DIFFERENTIAL (CANCER CENTER ONLY)
Abs Immature Granulocytes: 0.03 10*3/uL (ref 0.00–0.07)
Basophils Absolute: 0 10*3/uL (ref 0.0–0.1)
Basophils Relative: 1 %
Eosinophils Absolute: 0 10*3/uL (ref 0.0–0.5)
Eosinophils Relative: 1 %
HCT: 31.7 % — ABNORMAL LOW (ref 36.0–46.0)
Hemoglobin: 10.6 g/dL — ABNORMAL LOW (ref 12.0–15.0)
Immature Granulocytes: 1 %
Lymphocytes Relative: 45 %
Lymphs Abs: 1.6 10*3/uL (ref 0.7–4.0)
MCH: 26.8 pg (ref 26.0–34.0)
MCHC: 33.4 g/dL (ref 30.0–36.0)
MCV: 80.3 fL (ref 80.0–100.0)
Monocytes Absolute: 0.1 10*3/uL (ref 0.1–1.0)
Monocytes Relative: 2 %
Neutro Abs: 1.8 10*3/uL (ref 1.7–7.7)
Neutrophils Relative %: 50 %
Platelet Count: 143 10*3/uL — ABNORMAL LOW (ref 150–400)
RBC: 3.95 MIL/uL (ref 3.87–5.11)
RDW: 13.8 % (ref 11.5–15.5)
WBC Count: 3.5 10*3/uL — ABNORMAL LOW (ref 4.0–10.5)
nRBC: 0 % (ref 0.0–0.2)

## 2022-07-11 LAB — TSH: TSH: 0.565 u[IU]/mL (ref 0.350–4.500)

## 2022-07-11 MED ORDER — SODIUM CHLORIDE 0.9% FLUSH
10.0000 mL | Freq: Once | INTRAVENOUS | Status: AC
  Administered 2022-07-11: 10 mL

## 2022-07-11 MED ORDER — SODIUM CHLORIDE 0.9 % IV SOLN: Freq: Once | INTRAVENOUS | Status: AC

## 2022-07-11 MED ORDER — PACLITAXEL PROTEIN-BOUND CHEMO INJECTION 100 MG
80.0000 mg/m2 | Freq: Once | INTRAVENOUS | Status: AC
  Administered 2022-07-11: 200 mg via INTRAVENOUS
  Filled 2022-07-11: qty 40

## 2022-07-11 MED ORDER — HEPARIN SOD (PORK) LOCK FLUSH 100 UNIT/ML IV SOLN
500.0000 [IU] | Freq: Once | INTRAVENOUS | Status: AC | PRN
  Administered 2022-07-11: 500 [IU]

## 2022-07-11 MED ORDER — ZOLPIDEM TARTRATE 5 MG PO TABS: 5.0000 mg | ORAL_TABLET | Freq: Every evening | ORAL | 0 refills | Status: DC | PRN

## 2022-07-11 MED ORDER — TRAMADOL HCL 50 MG PO TABS: 50.0000 mg | ORAL_TABLET | Freq: Four times a day (QID) | ORAL | 0 refills | Status: DC | PRN

## 2022-07-11 MED ORDER — PROCHLORPERAZINE MALEATE 10 MG PO TABS
10.0000 mg | ORAL_TABLET | Freq: Once | ORAL | Status: AC
  Administered 2022-07-11: 10 mg via ORAL
  Filled 2022-07-11: qty 1

## 2022-07-11 MED ORDER — SODIUM CHLORIDE 0.9% FLUSH
10.0000 mL | INTRAVENOUS | Status: DC | PRN
  Administered 2022-07-11: 10 mL

## 2022-07-11 NOTE — Patient Instructions (Signed)
[facility]  Discharge Instructions: Thank you for choosing [facility] to provide your oncology and hematology care.   If you have a lab appointment with the [facility], please go directly to the [facility] and check in at the registration area.   Wear comfortable clothing and clothing appropriate for easy access to any Portacath or PICC line.   We strive to give you quality time with your provider. You may need to reschedule your appointment if you arrive late (15 or more minutes).  Arriving late affects you and other patients whose appointments are after yours.  Also, if you miss three or more appointments without notifying the office, you may be dismissed from the clinic at the provider's discretion.      For prescription refill requests, have your pharmacy contact our office and allow 72 hours for refills to be completed.    Today you received the following chemotherapy and/or immunotherapy agent: Abraxane      To help prevent nausea and vomiting after your treatment, we encourage you to take your nausea medication as directed.  BELOW ARE SYMPTOMS THAT SHOULD BE REPORTED IMMEDIATELY: *FEVER GREATER THAN 100.4 F (38 C) OR HIGHER *CHILLS OR SWEATING *NAUSEA AND VOMITING THAT IS NOT CONTROLLED WITH YOUR NAUSEA MEDICATION *UNUSUAL SHORTNESS OF BREATH *UNUSUAL BRUISING OR BLEEDING *URINARY PROBLEMS (pain or burning when urinating, or frequent urination) *BOWEL PROBLEMS (unusual diarrhea, constipation, pain near the anus) TENDERNESS IN MOUTH AND THROAT WITH OR WITHOUT PRESENCE OF ULCERS (sore throat, sores in mouth, or a toothache) UNUSUAL RASH, SWELLING OR PAIN  UNUSUAL VAGINAL DISCHARGE OR ITCHING   Items with * indicate a potential emergency and should be followed up as soon as possible or go to the Emergency Department if any problems should occur.  Please show the CHEMOTHERAPY ALERT CARD or IMMUNOTHERAPY ALERT CARD at check-in to  the Emergency Department and triage nurse.  Should you have questions after your visit or need to cancel or reschedule your appointment, please contact [facility]  Dept: [phone]  and follow the prompts.  Office hours are 8:00 a.m. to 4:30 p.m. Monday - Friday. Please note that voicemails left after 4:00 p.m. may not be returned until the following business day.  We are closed weekends and major holidays. You have access to a nurse at all times for urgent questions. Please call the main number to the clinic Dept: [phone] and follow the prompts.   For any non-urgent questions, you may also contact your provider using MyChart. We now offer e-Visits for anyone 90 and older to request care online for non-urgent symptoms. For details visit [email or web address].   Also download the MyChart app! Go to the app store, search "MyChart", open the app, select [facility], and log in with your MyChart username and password.  Masks are optional in the cancer centers. If you would like for your care team to wear a mask while they are taking care of you, please let them know. You may have one support person who is at least 32 years old accompany you for your appointments. Paclitaxel Nanoparticle Albumin-Bound Injection What is this medication? NANOPARTICLE ALBUMIN-BOUND PACLITAXEL (Na no PAHR ti kuhl al BYOO muhn-bound PAK li TAX el) treats some types of cancer. It works by slowing down the growth of cancer cells. This medicine may be used for other purposes; ask your health care provider or pharmacist if you have questions. COMMON BRAND NAME(S): Abraxane What  should I tell my care team before I take this medication? They need to know if you have any of these conditions: Liver disease Low white blood cell levels An unusual or allergic reaction to paclitaxel, albumin, other medications, foods, dyes, or preservatives If you or your partner are pregnant or trying to get  pregnant Breast-feeding How should I use this medication? This medication is injected into a vein. It is given by your care team in a hospital or clinic setting. Talk to your care team about the use of this medication in children. Special care may be needed. Overdosage: If you think you have taken too much of this medicine contact a poison control center or emergency room at once. NOTE: This medicine is only for you. Do not share this medicine with others. What if I miss a dose? Keep appointments for follow-up doses. It is important not to miss your dose. Call your care team if you are unable to keep an appointment. What may interact with this medication? Other medications may affect the way this medication works. Talk with your care team about all of the medications you take. They may suggest changes to your treatment plan to lower the risk of side effects and to make sure your medications work as intended. This list may not describe all possible interactions. Give your health care provider a list of all the medicines, herbs, non-prescription drugs, or dietary supplements you use. Also tell them if you smoke, drink alcohol, or use illegal drugs. Some items may interact with your medicine. What should I watch for while using this medication? Your condition will be monitored carefully while you are receiving this medication. You may need blood work while taking this medication. This medication may make you feel generally unwell. This is not uncommon as chemotherapy can affect healthy cells as well as cancer cells. Report any side effects. Continue your course of treatment even though you feel ill unless your care team tells you to stop. This medication can cause serious allergic reactions. To reduce the risk, your care team may give you other medications to take before receiving this one. Be sure to follow the directions from your care team. This medication may increase your risk of getting an  infection. Call your care team for advice if you get a fever, chills, sore throat, or other symptoms of a cold or flu. Do not treat yourself. Try to avoid being around people who are sick. This medication may increase your risk to bruise or bleed. Call your care team if you notice any unusual bleeding. Be careful brushing or flossing your teeth or using a toothpick because you may get an infection or bleed more easily. If you have any dental work done, tell your dentist you are receiving this medication. Talk to your care team if you or your partner may be pregnant. Serious birth defects can occur if you take this medication during pregnancy and for 6 months after the last dose. You will need a negative pregnancy test before starting this medication. Contraception is recommended while taking this medication and for 6 months after the last dose. Your care team can help you find the option that works for you. If your partner can get pregnant, use a condom during sex while taking this medication and for 3 months after the last dose. Do not breastfeed while taking this medication and for 2 weeks after the last dose. This medication may cause infertility. Talk to your care team if you are  concerned about your fertility. What side effects may I notice from receiving this medication? Side effects that you should report to your care team as soon as possible: Allergic reactions--skin rash, itching, hives, swelling of the face, lips, tongue, or throat Dry cough, shortness of breath or trouble breathing Infection--fever, chills, cough, sore throat, wounds that don't heal, pain or trouble when passing urine, general feeling of discomfort or being unwell Low red blood cell level--unusual weakness or fatigue, dizziness, headache, trouble breathing Pain, tingling, or numbness in the hands or feet Stomach pain, unusual weakness or fatigue, nausea, vomiting, diarrhea, or fever that lasts longer than expected Unusual  bruising or bleeding Side effects that usually do not require medical attention (report to your care team if they continue or are bothersome): Diarrhea Fatigue Hair loss Loss of appetite Nausea Vomiting This list may not describe all possible side effects. Call your doctor for medical advice about side effects. You may report side effects to FDA at 1-800-FDA-1088. Where should I keep my medication? This medication is given in a hospital or clinic. It will not be stored at home. NOTE: This sheet is a summary. It may not cover all possible information. If you have questions about this medicine, talk to your doctor, pharmacist, or health care provider.  2023 Elsevier/Gold Standard (2022-03-06 00:00:00)

## 2022-07-11 NOTE — Progress Notes (Addendum)
[facility]   [phone] [phone]   Clinic Follow up Note   Patient Care Team: Trey Sailors, PA as PCP - General (Physician Assistant) Rockwell Germany, RN as Oncology Nurse Navigator Mauro Kaufmann, RN as Oncology Nurse Navigator Rolm Bookbinder, MD as Consulting Physician (General Surgery) Truitt Merle, MD as Consulting Physician (Hematology) Kyung Rudd, MD as Consulting Physician (Radiation Oncology) Latanya Maudlin, MD as Consulting Physician (Orthopedic Surgery)  Date of Service:  07/11/2022  CHIEF COMPLAINT: f/u of left breast cancer  CURRENT THERAPY:  Adjuvant carboplatin/taxol, weekly, starting 06/14/22 [name], starting 06/14/22  ASSESSMENT & PLAN:  Gabrielle [name] is a 31 y.o. pre-menopausal female with   1. 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 Biopsy 03/11/22 showed metaplastic carcinoma. -baseline echo 03/25/22 was normal (EF 60-65%). -staging CT CAP and bone scan 03/26/22 negative for disease outside of the breast. 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 SLN biopsy on 05/14/22 was negative (0/4). -given large tumor size and young age, adjuvant chemo is recommended. Plan for 3 months of weekly carboplatin/taxol, followed by 4 cycles of AC every 3 weeks, with Beryle Flock  -she started zoladex on 06/03/22. -she began chemotherapy with carboplatin/taxol and Keytruda on 06/14/22. She has developed whole-body itching and mild numbness since last treatment. We will switch her Taxol to abraxane today. -labs reviewed, hgb 10.6, WBC 3.5. Otherwise adequate to proceed with week 5 today, with abraxane.    2. Back Pain, Left arm tenderness -she reports she woke up with left-sided lower back pain when she walks today,  possibly related due to fatigue and laying down after chemo. I will refill tramadol for her -she will f/u with her orthopedics for her back pain, which she had before   3. Anxiety, Insomnia -she reports a history of anxiety, relieved with marijuana -I prescribed Ambien on 04/10/22 for her to use as needed.   4. Family History, Genetics -she has a strong family history of breast cancer, in both sides of her family. (Of note, Dr. Donne Hazel is also her mother's surgeon) -genetic testing done on 03/20/22 was negative.     Plan -proceed with week 5 chemo with abraxane today. Wil stop Taxol due to allergy reaction  -I refilled tramadol and ambien -GCSF daily for 3 days as scheduled  -lab, flush, and Abraxane weekly              -Carbo and Keytruda every 3 weeks, next due in 1 week -f/u every other week   No problem-specific Assessment & Plan notes found for this encounter.   SUMMARY OF ONCOLOGIC HISTORY: Oncology History Overview Note   Cancer Staging  Malignant neoplasm of upper-outer quadrant of left breast in female, estrogen receptor negative ([place]) Staging form: Breast, AJCC 8th Edition - Clinical stage from 03/11/2022: Stage IIB (cT2, cN0, cM0, G3, ER-, PR-, HER2-) - Signed by Truitt Merle, MD on 03/19/2022    Malignant neoplasm of upper-outer quadrant of left breast in female, estrogen receptor negative ([facility])  03/08/2022 Mammogram   CLINICAL DATA:  31 year old female presenting for evaluation of a palpable lump in the left breast which she feels has increased in size since she first identified it. Her mother was diagnosed with breast cancer within the last 6 months. She also has family history of breast cancer in  multiple aunts and her maternal grandmother.   EXAM: DIGITAL DIAGNOSTIC BILATERAL MAMMOGRAM WITH TOMOSYNTHESIS AND CAD; ULTRASOUND LEFT BREAST LIMITED  IMPRESSION: 1. There is a suspicious 3.7 cm mass in the left breast at 12 o'clock.   2.  No evidence of left axillary  lymphadenopathy.   3.  No evidence of malignancy in the right breast.   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 Based on the biopsy, the carcinoma appears Nottingham grade 3 of 3 and measures 1.2 cm in greatest linear extent.  PROGNOSTIC INDICATORS Results: The tumor cells are NEGATIVE for Her2 (0). Estrogen Receptor: 0%, NEGATIVE Progesterone Receptor: 0%, NEGATIVE Proliferation Marker Ki67: 40%   03/15/2022 Initial Diagnosis   Malignant neoplasm of upper-outer quadrant of left breast in female, estrogen receptor negative ([facility])   03/28/2022 Genetic Testing   Negative hereditary cancer genetic testing: no pathogenic variants detected in [facility] BRCAPlus Panel or [facility] CustomNext-Cancer +RNAinsight Panel.  Report dates are 03/28/2022 and 03/31/2022. Marland Kitchen   The BRCAplus panel offered by [employer] and includes sequencing and deletion/duplication analysis for the following 8 genes: ATM, BRCA1, BRCA2, CDH1, CHEK2, PALB2, PTEN, and TP53.  The CustomNext-Cancer+RNAinsight panel offered by [facility] includes sequencing and rearrangement analysis for the following 47 genes:  APC, ATM, AXIN2, BARD1, BMPR1A, BRCA1, BRCA2, BRIP1, CDH1, CDK4, CDKN2A, CHEK2, DICER1, EPCAM, GREM1, HOXB13, MEN1, MLH1, MSH2, MSH3, MSH6, MUTYH, NBN, NF1, NF2, NTHL1, PALB2, PMS2, POLD1, POLE, PTEN, RAD51C, RAD51D, RECQL, RET, SDHA, SDHAF2, SDHB, SDHC, SDHD, SMAD4, SMARCA4, STK11, TP53, TSC1, TSC2, and VHL.  RNA data is routinely analyzed for use  in variant interpretation for all genes.   05/14/2022 Cancer Staging   Staging form: Breast, AJCC 8th Edition - Pathologic stage from 05/14/2022: Stage IIA (pT2, pN0, cM0, G3, ER-, PR-, HER2-) - Signed by Truitt Merle, MD on 05/28/2022 Stage prefix: Initial diagnosis Histologic grading system: 3 grade system Residual tumor (R): R0 - None   06/13/2022 -  Chemotherapy   Patient is on Treatment Plan : BREAST Pembrolizumab (200) D1 + Carboplatin (1.5) D1,8,15 + Paclitaxel (80) D1,8,15 q21d X 4 cycles / Pembrolizumab (200) D1 + AC D1 q21d x 4 cycles     06/14/2022 - 07/05/2022 Chemotherapy   Patient is on Treatment Plan : BREAST Pembrolizumab (200) D1 + Carboplatin (5) D1 + Paclitaxel (80) D1,8,15 q21d X 4 cycles / Pembrolizumab (200) D1 + AC D1 q21d x 4 cycles        INTERVAL HISTORY:  Gabrielle [name] is here for a follow up of breast cancer. She was last seen by me on 06/28/22. She presents to the clinic alone. She reports she woke up with back pain today. She explains the pain is on the lower left side, worse with standing and walking. She rates the pain at 9/10 at most, not relieved with 63m motrin. She reports whole body itching and a rash to her neck where she scratched. She explains it started after chemo last week. She also reports soreness and tenderness to her left arm, with pain with extension. She notes  this has been going on for 2 weeks. She explains this is not the arm she uses for IV.   All other systems were reviewed with the patient and are negative.  MEDICAL HISTORY:  Past Medical History:  Diagnosis Date   Arthritis    Bilateral Knees   Cancer ([place]) 03/11/2022   Breast   Family history of breast cancer 03/21/2022   Family history of prostate cancer 03/21/2022   Hypercholesteremia     SURGICAL HISTORY: Past Surgical History:  Procedure Laterality Date   AXILLARY SENTINEL NODE BIOPSY Left 05/14/2022   Procedure: LEFT AXILLARY SENTINEL NODE BIOPSY;  Surgeon: Rolm Bookbinder, MD;  Location: [facility];  Service: General;  Laterality: Left;  GEN w/ PEC BLOCK   BREAST BIOPSY Left 03/11/2022   BREAST LUMPECTOMY WITH AXILLARY LYMPH NODE BIOPSY Left 04/16/2022   Procedure: LEFT BREAST LUMPECTOMY WITH LEFT AXILLARY SENTINEL LYMPH NODE BIOPSY;  Surgeon: Rolm Bookbinder, MD;  Location: [facility];  Service: General;  Laterality: Left;   DENTAL SURGERY     PORTACATH PLACEMENT Right 04/16/2022   Procedure: INSERTION PORT-A-CATH;  Surgeon: Rolm Bookbinder, MD;  Location: [facility];  Service: General;  Laterality: Right;    I have reviewed the social history and family history with the patient and they are unchanged from previous note.  ALLERGIES:  has No Known Allergies.  MEDICATIONS:  Current Outpatient Medications  Medication Sig Dispense Refill   acetaminophen (TYLENOL) 500 MG tablet Take 1,000 mg by mouth every 6 (six) hours as needed (pain.).     ibuprofen (ADVIL) 200 MG tablet Take 200 mg by mouth every 8 (eight) hours as needed (pain).     prochlorperazine (COMPAZINE) 10 MG tablet Take 1 tablet (10 mg total) by mouth every 6 (six) hours as needed for nausea or vomiting. 30 tablet 0   traMADol (ULTRAM) 50 MG tablet Take 1 tablet (50 mg total) by mouth every 6 (six) hours as needed. 60 tablet 0   zolpidem (AMBIEN) 5 MG tablet Take 1 tablet (5 mg total) by mouth at bedtime as needed for sleep. 30 tablet 0   No current facility-administered medications for this visit.   Facility-Administered Medications Ordered in Other Visits  Medication Dose Route Frequency Provider Last Rate Last Admin   diphenhydrAMINE (BENADRYL) 50 MG/ML injection            famotidine (PEPCID) 20-0.9 MG/50ML-% IVPB            sodium chloride flush (NS) 0.9 % injection 10 mL  10 mL Intracatheter PRN Truitt Merle, MD   10 mL at 07/11/22 1246    PHYSICAL EXAMINATION: ECOG PERFORMANCE STATUS: 2 - Symptomatic, <50% confined to bed  Vitals:   07/11/22 1010   BP: 114/75  Pulse: 76  Resp: 18  Temp: 98.7 F (37.1 C)  SpO2: 100%   Wt Readings from Last 3 Encounters:  07/11/22 274 lb 9.6 oz (124.6 kg)  07/05/22 274 lb (124.3 kg)  06/28/22 278 lb 8 oz (126.3 kg)     GENERAL:alert, no distress and comfortable SKIN: skin color normal, no rashes or significant lesions EYES: normal, Conjunctiva are pink and non-injected, sclera clear  NEURO: alert & oriented x 3 with fluent speech  LABORATORY DATA:  I have reviewed the data as listed    Latest Ref Rng & Units 07/11/2022    9:50 AM 07/05/2022   11:16 AM 06/28/2022    9:00 AM  CBC  WBC 4.0 -  10.5 K/uL 3.5  6.6  5.4   Hemoglobin 12.0 - 15.0 g/dL 10.6  11.1  10.9   Hematocrit 36.0 - 46.0 % 31.7  33.4  32.6   Platelets 150 - 400 K/uL 143  264  234         Latest Ref Rng & Units 07/11/2022    9:50 AM 07/05/2022   12:03 PM 06/28/2022    9:00 AM  CMP  Glucose 70 - 99 mg/dL 96  90  93   BUN 6 - 20 mg/dL '15  16  16   ' Creatinine 0.44 - 1.00 mg/dL 0.72  0.89  0.89   Sodium 135 - 145 mmol/L 136  136  137   Potassium 3.5 - 5.1 mmol/L 3.7  3.8  3.9   Chloride 98 - 111 mmol/L 102  104  104   CO2 22 - 32 mmol/L '29  29  28   ' Calcium 8.9 - 10.3 mg/dL 10.1  10.3  9.5   Total Protein 6.5 - 8.1 g/dL 7.6  7.4  7.3   Total Bilirubin 0.3 - 1.2 mg/dL 0.3  0.3  0.2   Alkaline Phos 38 - 126 U/L 102  105  98   AST 15 - 41 U/L '20  16  28   ' ALT 0 - 44 U/L '19  17  27       ' RADIOGRAPHIC STUDIES: I have personally reviewed the radiological images as listed and agreed with the findings in the report. No results found.    Orders Placed This Encounter  Procedures   CBC with Differential ([facility] Only)    Standing Status:   Future    Standing Expiration Date:   07/12/2023   CMP ([facility] only)    Standing Status:   Future    Standing Expiration Date:   07/12/2023   CBC with Differential ([facility] Only)    Standing Status:   Future    Standing Expiration Date:   07/19/2023   CMP ([facility] only)    Standing Status:   Future    Standing Expiration Date:   07/19/2023   CBC with Differential ([facility] Only)    Standing Status:   Future    Standing Expiration Date:   07/26/2023   CMP ([facility] only)    Standing Status:   Future    Standing Expiration Date:   07/26/2023   T4    Standing Status:   Future    Number of Occurrences:   1    Standing Expiration Date:   07/26/2023   TSH    Standing Status:   Future    Number of Occurrences:   1    Standing Expiration Date:   07/26/2023   CBC with Differential ([facility] Only)    Standing Status:   Future    Number of Occurrences:   1    Standing Expiration Date:   08/02/2023   CMP ([facility] only)    Standing Status:   Future    Number of Occurrences:   1    Standing Expiration Date:   08/02/2023   CBC with Differential ([facility] Only)    Standing Status:   Future    Standing Expiration Date:   08/09/2023   CMP ([facility] only)    Standing Status:   Future    Standing Expiration Date:   08/09/2023   CBC with Differential ([facility] Only)    Standing Status:  Future    Standing Expiration Date:   08/16/2023   CMP ([facility] only)    Standing Status:   Future    Standing Expiration Date:   08/16/2023   CBC with Differential ([facility] Only)    Standing Status:   Future    Standing Expiration Date:   08/23/2023   CMP ([facility] only)    Standing Status:   Future    Standing Expiration Date:   08/23/2023   CBC with Differential ([facility] Only)    Standing Status:   Future    Standing Expiration Date:   08/30/2023   CMP ([facility] only)    Standing Status:   Future    Standing Expiration Date:   08/30/2023   All questions were answered. The patient knows to call the clinic with any problems, questions or concerns. No barriers to learning was detected. The total time spent in the appointment was 40 minutes.     Truitt Merle, MD 07/11/2022   I, Wilburn Mylar, am acting as  scribe for Truitt Merle, MD.   I have reviewed the above documentation for accuracy and completeness, and I agree with the above.

## 2022-07-12 ENCOUNTER — Encounter: Payer: Medicaid Other | Admitting: Rehabilitation

## 2022-07-12 ENCOUNTER — Inpatient Hospital Stay: Payer: Medicaid Other

## 2022-07-13 LAB — T4: T4, Total: 7.7 ug/dL (ref 4.5–12.0)

## 2022-07-14 ENCOUNTER — Encounter: Payer: Self-pay | Admitting: Hematology

## 2022-07-16 ENCOUNTER — Telehealth: Payer: Self-pay | Admitting: Hematology

## 2022-07-16 NOTE — Telephone Encounter (Signed)
Contacted patient to scheduled appointments. Left message with appointment details and a call back number if patient had any questions or could not accommodate the time we provided.   

## 2022-07-17 ENCOUNTER — Encounter: Payer: Self-pay | Admitting: Hematology

## 2022-07-17 ENCOUNTER — Inpatient Hospital Stay: Payer: Medicaid Other

## 2022-07-17 ENCOUNTER — Inpatient Hospital Stay (HOSPITAL_BASED_OUTPATIENT_CLINIC_OR_DEPARTMENT_OTHER): Payer: Medicaid Other | Admitting: Hematology

## 2022-07-17 DIAGNOSIS — C50412 Malignant neoplasm of upper-outer quadrant of left female breast: Secondary | ICD-10-CM | POA: Diagnosis not present

## 2022-07-17 DIAGNOSIS — Z171 Estrogen receptor negative status [ER-]: Secondary | ICD-10-CM

## 2022-07-17 MED ORDER — GABAPENTIN 100 MG PO CAPS: 100.0000 mg | ORAL_CAPSULE | Freq: Two times a day (BID) | ORAL | 0 refills | Status: DC

## 2022-07-17 NOTE — Progress Notes (Signed)
[place]   Telephone:([phone]) [phone] Fax:(336) [phone]   Clinic Follow up Note   Patient Care Team: Trey Sailors, PA as PCP - General (Physician Assistant) Rockwell Germany, RN as Oncology Nurse Navigator Mauro Kaufmann, RN as Oncology Nurse Navigator Rolm Bookbinder, MD as Consulting Physician (General Surgery) Truitt Merle, MD as Consulting Physician (Hematology) Kyung Rudd, MD as Consulting Physician (Radiation Oncology) Latanya Maudlin, MD as Consulting Physician (Orthopedic Surgery)  Date of Service:  07/17/2022  I connected with Gabrielle [name] on 07/17/2022 at  4:30 PM EDT by telephone visit and verified that I am speaking with the correct person using two identifiers.  I discussed the limitations, risks, security and privacy concerns of performing an evaluation and management service by telephone and the availability of in person appointments. I also discussed with the patient that there may be a patient responsible charge related to this service. The patient expressed understanding and agreed to proceed.   Other persons participating in the visit and their role in the encounter:  none  Patient's location:  home Provider's location:  my office  CHIEF COMPLAINT: LE edema, f/u of breast cancer  CURRENT THERAPY:  Adjuvant carboplatin/taxol, weekly, starting 06/14/22 Gabrielle [name], starting 06/14/22  ASSESSMENT & PLAN:  Gabrielle [name] is a 31 y.o. female with   1. LE edema, Neuropathy -developed on day 3 -improved with gabapentin, compression socks, and leg elevation -I refilled her gabapentin today.  2. 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 Biopsy 03/11/22 showed metaplastic carcinoma. -baseline echo 03/25/22 was normal (EF 60-65%). -staging CT CAP and bone scan 03/26/22 negative for disease outside of the  breast. 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 SLN biopsy on 05/14/22 was negative (0/4). -given large tumor size and young age, adjuvant chemo is recommended. Plan for 3 months of weekly carboplatin/taxol, followed by 4 cycles of AC every 3 weeks, with Beryle Flock  -she started zoladex on 06/03/22. -she began chemotherapy with carboplatin/taxol and Keytruda on 06/14/22. She has developed whole-body itching and mild numbness since last treatment. We switched her Taxol to abraxane with C2D8. 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   3. Anxiety, Insomnia -she reports a history of anxiety, relieved with marijuana -I prescribed Ambien on 04/10/22 for her to use as needed.   4. Family History, Genetics -she has a strong family history of breast cancer, in both sides of her family. (Of note, Dr. Donne Hazel is also her mother's surgeon) -genetic testing done on 03/20/22 was negative.     Plan -I refilled gabapentin for her neuropathy. -We will hold chemotherapy and G-CSF injection this week due to neuropathy -lab, flush, f/u and chemo and Keytruda next week, I plan to reduce her Abraxane dose to 60 mg/m due to neuropathy   No problem-specific Assessment & Plan notes found for this encounter.   SUMMARY OF ONCOLOGIC HISTORY: Oncology History Overview Note   Cancer Staging  Malignant neoplasm of upper-outer quadrant of left breast in female, estrogen receptor negative ([place]) Staging form: Breast, AJCC 8th Edition - Clinical stage from 03/11/2022: Stage IIB (cT2, cN0, cM0, G3, ER-, PR-, HER2-) - Signed by Truitt Merle, MD on 03/19/2022    Malignant neoplasm of upper-outer quadrant of left breast in female, estrogen receptor negative ([place])  03/08/2022 Mammogram   CLINICAL DATA:  31 year old female presenting for evaluation of a palpable lump in the left breast which she feels has  increased in size since she first identified it. Her mother was diagnosed with breast cancer within the last 6 months. She also has family history of breast cancer in multiple aunts and her maternal grandmother.   EXAM: DIGITAL DIAGNOSTIC BILATERAL MAMMOGRAM WITH TOMOSYNTHESIS AND CAD; ULTRASOUND LEFT BREAST LIMITED  IMPRESSION: 1. There is a suspicious 3.7 cm mass in the left breast at 12 o'clock.   2.  No evidence of left axillary lymphadenopathy.   3.  No evidence of malignancy in the right breast.   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 Based on the biopsy, the carcinoma appears Nottingham grade 3 of 3 and measures 1.2 cm in greatest linear extent.  PROGNOSTIC INDICATORS Results: The tumor cells are NEGATIVE for Her2 (0). Estrogen Receptor: 0%, NEGATIVE Progesterone Receptor: 0%, NEGATIVE Proliferation Marker Ki67: 40%   03/15/2022 Initial Diagnosis   Malignant neoplasm of upper-outer quadrant of left breast in female, estrogen receptor negative ([place])   03/28/2022 Genetic Testing   Negative hereditary cancer genetic testing: no pathogenic variants detected in [facility] BRCAPlus Panel or [employer] CustomNext-Cancer +RNAinsight Panel.  Report dates are 03/28/2022 and 03/31/2022. Marland Kitchen   The BRCAplus panel offered by [employer] and includes sequencing and deletion/duplication analysis for the following 8 genes: ATM, BRCA1, BRCA2, CDH1, CHEK2, PALB2, PTEN, and TP53.  The CustomNext-Cancer+RNAinsight panel  offered by [employer] includes sequencing and rearrangement analysis for the following 47 genes:  APC, ATM, AXIN2, BARD1, BMPR1A, BRCA1, BRCA2, BRIP1, CDH1, CDK4, CDKN2A, CHEK2, DICER1, EPCAM, GREM1, HOXB13, MEN1, MLH1, MSH2, MSH3, MSH6, MUTYH, NBN, NF1, NF2, NTHL1, PALB2, PMS2, POLD1, POLE, PTEN, RAD51C, RAD51D, RECQL, RET, SDHA, SDHAF2, SDHB, SDHC, SDHD, SMAD4, SMARCA4, STK11, TP53, TSC1, TSC2, and VHL.  RNA data is routinely analyzed for use in variant interpretation for all genes.   05/14/2022 Cancer Staging   Staging form: Breast, AJCC 8th Edition - Pathologic stage from 05/14/2022: Stage IIA (pT2, pN0, cM0, G3, ER-, PR-, HER2-) - Signed by Truitt Merle, MD on 05/28/2022 Stage prefix: Initial diagnosis Histologic grading system: 3 grade system Residual tumor (R): R0 - None   06/13/2022 -  Chemotherapy   Patient is on Treatment Plan : BREAST Pembrolizumab (200) D1 + Carboplatin (1.5) D1,8,15 + Paclitaxel (80) D1,8,15 q21d X 4 cycles / Pembrolizumab (200) D1 + AC D1 q21d x 4 cycles     06/14/2022 - 07/05/2022 Chemotherapy   Patient is on Treatment Plan : BREAST Pembrolizumab (200) D1 + Carboplatin (5) D1 + Paclitaxel (80) D1,8,15 q21d X 4 cycles / Pembrolizumab (200) D1 + AC D1 q21d x 4 cycles        INTERVAL HISTORY:  Gabrielle [name] was contacted for a follow up of breast cancer. She was last seen by me on 07/11/22.  She reports her swelling resolved with compression socks.   All other systems were reviewed with the patient and are negative.  MEDICAL HISTORY:  Past Medical  History:  Diagnosis Date   Arthritis    Bilateral Knees   Cancer ([place]) 03/11/2022   Breast   Family history of breast cancer 03/21/2022   Family history of prostate cancer 03/21/2022   Hypercholesteremia     SURGICAL HISTORY: Past Surgical History:  Procedure Laterality Date   AXILLARY SENTINEL NODE BIOPSY Left 05/14/2022   Procedure: LEFT AXILLARY SENTINEL NODE BIOPSY;  Surgeon: Rolm Bookbinder, MD;   Location: [place];  Service: General;  Laterality: Left;  GEN w/ PEC BLOCK   BREAST BIOPSY Left 03/11/2022   BREAST LUMPECTOMY WITH AXILLARY LYMPH NODE BIOPSY Left 04/16/2022   Procedure: LEFT BREAST LUMPECTOMY WITH LEFT AXILLARY SENTINEL LYMPH NODE BIOPSY;  Surgeon: Rolm Bookbinder, MD;  Location: [place];  Service: General;  Laterality: Left;   DENTAL SURGERY     PORTACATH PLACEMENT Right 04/16/2022   Procedure: INSERTION PORT-A-CATH;  Surgeon: Rolm Bookbinder, MD;  Location: [place];  Service: General;  Laterality: Right;    I have reviewed the social history and family history with the patient and they are unchanged from previous note.  ALLERGIES:  has No Known Allergies.  MEDICATIONS:  Current Outpatient Medications  Medication Sig Dispense Refill   gabapentin (NEURONTIN) 100 MG capsule Take 1-2 capsules (100-200 mg total) by mouth 2 (two) times daily. 60 capsule 0   acetaminophen (TYLENOL) 500 MG tablet Take 1,000 mg by mouth every 6 (six) hours as needed (pain.).     ibuprofen (ADVIL) 200 MG tablet Take 200 mg by mouth every 8 (eight) hours as needed (pain).     prochlorperazine (COMPAZINE) 10 MG tablet Take 1 tablet (10 mg total) by mouth every 6 (six) hours as needed for nausea or vomiting. 30 tablet 0   traMADol (ULTRAM) 50 MG tablet Take 1 tablet (50 mg total) by mouth every 6 (six) hours as needed. 60 tablet 0   zolpidem (AMBIEN) 5 MG tablet Take 1 tablet (5 mg total) by mouth at bedtime as needed for sleep. 30 tablet 0   No current facility-administered medications for this visit.   Facility-Administered Medications Ordered in Other Visits  Medication Dose Route Frequency Provider Last Rate Last Admin   diphenhydrAMINE (BENADRYL) 50 MG/ML injection            famotidine (PEPCID) 20-0.9 MG/50ML-% IVPB             PHYSICAL EXAMINATION: ECOG PERFORMANCE STATUS: 2 - Symptomatic, <50% confined to bed  There were no vitals filed for this  visit. Wt Readings from Last 3 Encounters:  07/11/22 274 lb 9.6 oz (124.6 kg)  07/05/22 274 lb (124.3 kg)  06/28/22 278 lb 8 oz (126.3 kg)     No vitals taken today, Exam not performed today  LABORATORY DATA:  I have reviewed the data as listed    Latest Ref Rng & Units 07/11/2022    9:50 AM 07/05/2022   11:16 AM 06/28/2022    9:00 AM  CBC  WBC 4.0 - 10.5 K/uL 3.5  6.6  5.4   Hemoglobin 12.0 - 15.0 g/dL 10.6  11.1  10.9   Hematocrit 36.0 - 46.0 % 31.7  33.4  32.6   Platelets 150 - 400 K/uL 143  264  234         Latest Ref Rng & Units 07/11/2022    9:50 AM 07/05/2022   12:03 PM 06/28/2022    9:00 AM  CMP  Glucose 70 - 99 mg/dL 96  90  93   BUN 6 - 20 mg/dL '15  16  16   ' Creatinine 0.44 - 1.00 mg/dL 0.72  0.89  0.89   Sodium 135 - 145 mmol/L 136  136  137   Potassium 3.5 - 5.1 mmol/L 3.7  3.8  3.9   Chloride 98 - 111 mmol/L 102  104  104   CO2 22 - 32 mmol/L '29  29  28   ' Calcium 8.9 - 10.3 mg/dL 10.1  10.3  9.5   Total Protein 6.5 - 8.1 g/dL 7.6  7.4  7.3   Total Bilirubin 0.3 - 1.2 mg/dL 0.3  0.3  0.2   Alkaline Phos 38 - 126 U/L 102  105  98   AST 15 - 41 U/L '20  16  28   ' ALT 0 - 44 U/L '19  17  27       ' RADIOGRAPHIC STUDIES: I have personally reviewed the radiological images as listed and agreed with the findings in the report. No results found.    No orders of the defined types were placed in this encounter.  All questions were answered. The patient knows to call the clinic with any problems, questions or concerns. No barriers to learning was detected. The total time spent in the appointment was 22 minutes.     Truitt Merle, MD 07/17/2022   I, Wilburn Mylar, am acting as scribe for Truitt Merle, MD.   I have reviewed the above documentation for accuracy and completeness, and I agree with the above.

## 2022-07-18 ENCOUNTER — Inpatient Hospital Stay: Payer: Medicaid Other

## 2022-07-19 ENCOUNTER — Inpatient Hospital Stay: Payer: Medicaid Other

## 2022-07-19 ENCOUNTER — Other Ambulatory Visit: Payer: Self-pay

## 2022-07-20 ENCOUNTER — Inpatient Hospital Stay: Payer: Medicaid Other

## 2022-07-23 ENCOUNTER — Inpatient Hospital Stay: Payer: Medicaid Other

## 2022-07-24 ENCOUNTER — Inpatient Hospital Stay: Payer: Medicaid Other

## 2022-07-25 MED FILL — Dexamethasone Sodium Phosphate Inj 100 MG/10ML: INTRAMUSCULAR | Qty: 1 | Status: AC

## 2022-07-25 NOTE — Progress Notes (Addendum)
[place]   Telephone:(336) [phone] Fax:(336) [phone]   Clinic Follow up Note   Patient Care Team: Trey Sailors, PA as PCP - General (Physician Assistant) Rockwell Germany, RN as Oncology Nurse Navigator Mauro Kaufmann, RN as Oncology Nurse Navigator Rolm Bookbinder, MD as Consulting Physician (General Surgery) Truitt Merle, MD as Consulting Physician (Hematology) Kyung Rudd, MD as Consulting Physician (Radiation Oncology) Latanya Maudlin, MD as Consulting Physician (Orthopedic Surgery)  Date of Service:  07/26/2022  CHIEF COMPLAINT: f/u of left breast cancer  CURRENT THERAPY:  Adjuvant carboplatin/taxol, weekly, starting 06/14/22  -taxol switched to abraxane from C2D8 Gabrielle [name], starting 06/14/22  ASSESSMENT & PLAN:  Gabrielle [name] is a 31 y.o. pre-menopausal female with   1.  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 -On adjuvant chemotherapy, chemo held last week due to worsening neuropathy on feet  2.  LE edema, Neuropathy in feet -Secondary to chemotherapy, improved  Plan -we held her chemo last week due to neuropathy, she has recovered well, neuropathy is mild and tolerable in feet, she is on gabapentin -Lab reviewed, adequate for treatment, will plan to proceed with cycle 3 D1 chemo with carbo, taxol and Keytruda, will reduce Taxol dose to 60 mg/m.  She will use ice pack for her feet and hands also during chemotherapy -If her neuropathy gets worse, will stop carbo/taxol, and move to [facility]  -f/u in 2 weeks    No problem-specific Assessment & Plan notes found for this encounter.   SUMMARY OF ONCOLOGIC HISTORY: Oncology History Overview Note   Cancer Staging  Malignant neoplasm of upper-outer quadrant of left breast in female, estrogen receptor negative ([name]) Staging form:  Breast, AJCC 8th Edition - Clinical stage from 03/11/2022: Stage IIB (cT2, cN0, cM0, G3, ER-, PR-, HER2-) - Signed by Truitt Merle, MD on 03/19/2022    Malignant neoplasm of upper-outer quadrant of left breast in female, estrogen receptor negative ([name])  03/08/2022 Mammogram   CLINICAL DATA:  31 year old female presenting for evaluation of a palpable lump in the left breast which she feels has increased in size since she first identified it. Her mother was diagnosed with breast cancer within the last 6 months. She also has family history of breast cancer in multiple aunts and her maternal grandmother.   EXAM: DIGITAL DIAGNOSTIC BILATERAL MAMMOGRAM WITH TOMOSYNTHESIS AND CAD; ULTRASOUND LEFT BREAST LIMITED  IMPRESSION: 1. There is a suspicious 3.7 cm mass in the left breast at 12 o'clock.   2.  No evidence of left axillary lymphadenopathy.   3.  No evidence of malignancy in the right breast.   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 Based on the biopsy, the carcinoma appears Nottingham grade 3 of 3 and measures 1.2 cm in greatest linear extent.  PROGNOSTIC INDICATORS Results: The tumor cells are NEGATIVE for Her2 (0). Estrogen Receptor: 0%, NEGATIVE Progesterone Receptor: 0%, NEGATIVE Proliferation Marker Ki67: 40%  03/15/2022 Initial Diagnosis   Malignant neoplasm of upper-outer quadrant of left breast in female, estrogen receptor negative ([name])   03/28/2022 Genetic Testing   Negative hereditary cancer  genetic testing: no pathogenic variants detected in [employer] BRCAPlus Panel or [employer] CustomNext-Cancer +RNAinsight Panel.  Report dates are 03/28/2022 and 03/31/2022. Marland Kitchen   The BRCAplus panel offered by [employer] and includes sequencing and deletion/duplication analysis for the following 8 genes: ATM, BRCA1, BRCA2, CDH1, CHEK2, PALB2, PTEN, and TP53.  The CustomNext-Cancer+RNAinsight panel offered by [employer] includes sequencing and rearrangement analysis for the following 47 genes:  APC, ATM, AXIN2, BARD1, BMPR1A, BRCA1, BRCA2, BRIP1, CDH1, CDK4, CDKN2A, CHEK2, DICER1, EPCAM, GREM1, HOXB13, MEN1, MLH1, MSH2, MSH3, MSH6, MUTYH, NBN, NF1, NF2, NTHL1, PALB2, PMS2, POLD1, POLE, PTEN, RAD51C, RAD51D, RECQL, RET, SDHA, SDHAF2, SDHB, SDHC, SDHD, SMAD4, SMARCA4, STK11, TP53, TSC1, TSC2, and VHL.  RNA data is routinely analyzed for use in variant interpretation for all genes.   05/14/2022 Cancer Staging   Staging form: Breast, AJCC 8th Edition - Pathologic stage from 05/14/2022: Stage IIA (pT2, pN0, cM0, G3, ER-, PR-, HER2-) - Signed by Truitt Merle, MD on 05/28/2022 Stage prefix: Initial diagnosis Histologic grading system: 3 grade system Residual tumor (R): R0 - None   06/13/2022 -  Chemotherapy   Patient is on Treatment Plan : BREAST Pembrolizumab (200) D1 + Carboplatin (1.5) D1,8,15 + Paclitaxel (80) D1,8,15 q21d X 4 cycles / Pembrolizumab (200) D1 + AC D1 q21d x 4 cycles     06/14/2022 - 07/05/2022 Chemotherapy   Patient is on Treatment Plan : BREAST Pembrolizumab (200) D1 + Carboplatin (5) D1 + Paclitaxel (80) D1,8,15 q21d X 4 cycles / Pembrolizumab (200) D1 + AC D1 q21d x 4 cycles        INTERVAL HISTORY:  Gabrielle [name] is here for a follow up of breast cancer. She was last seen by me on 07/17/22. She presents to the clinic alone.  We canceled her chemotherapy and G-CSF injection last week due to her worsening neuropathy in her feet.  She is feeling better, able to walk without any difficulty, she is  taking gabapentin.  Denies any neuropathy in her hands, no other new symptoms.   All other systems were reviewed with the patient and are negative.  MEDICAL HISTORY:  Past Medical History:  Diagnosis Date   Arthritis    Bilateral Knees   Cancer ([place]) 03/11/2022   Breast   Family history of breast cancer 03/21/2022   Family history of prostate cancer 03/21/2022   Hypercholesteremia     SURGICAL HISTORY: Past Surgical History:  Procedure Laterality Date   AXILLARY SENTINEL NODE BIOPSY Left 05/14/2022   Procedure: LEFT AXILLARY SENTINEL NODE BIOPSY;  Surgeon: Rolm Bookbinder, MD;  Location: [place];  Service: General;  Laterality: Left;  GEN w/ PEC BLOCK   BREAST BIOPSY Left 03/11/2022   BREAST LUMPECTOMY WITH AXILLARY LYMPH NODE BIOPSY Left 04/16/2022   Procedure: LEFT BREAST LUMPECTOMY WITH LEFT AXILLARY SENTINEL LYMPH NODE BIOPSY;  Surgeon: Rolm Bookbinder, MD;  Location: [place];  Service: General;  Laterality: Left;   DENTAL SURGERY     PORTACATH PLACEMENT Right 04/16/2022   Procedure: INSERTION PORT-A-CATH;  Surgeon: Rolm Bookbinder, MD;  Location: [place];  Service: General;  Laterality: Right;    I have reviewed the social history and family history with the patient and they are unchanged from previous note.  ALLERGIES:  has No Known Allergies.  MEDICATIONS:  Current  Outpatient Medications  Medication Sig Dispense Refill   acetaminophen (TYLENOL) 500 MG tablet Take 1,000 mg by mouth every 6 (six) hours as needed (pain.).     gabapentin (NEURONTIN) 100 MG capsule Take 1-2 capsules (100-200 mg total) by mouth 2 (two) times daily. 60 capsule 0   ibuprofen (ADVIL) 200 MG tablet Take 200 mg by mouth every 8 (eight) hours as needed (pain).     prochlorperazine (COMPAZINE) 10 MG tablet Take 1 tablet (10 mg total) by mouth every 6 (six) hours as needed for nausea or vomiting. 30 tablet 0   traMADol (ULTRAM) 50 MG tablet Take 1 tablet (50 mg  total) by mouth every 6 (six) hours as needed. 60 tablet 0   zolpidem (AMBIEN) 5 MG tablet Take 1 tablet (5 mg total) by mouth at bedtime as needed for sleep. 30 tablet 0   No current facility-administered medications for this visit.   Facility-Administered Medications Ordered in Other Visits  Medication Dose Route Frequency Provider Last Rate Last Admin   diphenhydrAMINE (BENADRYL) 50 MG/ML injection            famotidine (PEPCID) 20-0.9 MG/50ML-% IVPB            prochlorperazine (COMPAZINE) tablet 10 mg  10 mg Oral Q6H PRN Truitt Merle, MD   10 mg at 07/26/22 1131   sodium chloride flush (NS) 0.9 % injection 10 mL  10 mL Intracatheter PRN Truitt Merle, MD   10 mL at 07/26/22 1453    PHYSICAL EXAMINATION: ECOG PERFORMANCE STATUS: 1 - Symptomatic but completely ambulatory  Vitals:   07/26/22 1036  BP: 109/70  Pulse: 80  Resp: 15  Temp: 97.8 F (36.6 C)  SpO2: 100%   Wt Readings from Last 3 Encounters:  07/26/22 275 lb 6.4 oz (124.9 kg)  07/11/22 274 lb 9.6 oz (124.6 kg)  07/05/22 274 lb (124.3 kg)     GENERAL:alert, no distress and comfortable SKIN: skin color, texture, turgor are normal, no rashes or significant lesions EYES: normal, Conjunctiva are pink and non-injected, sclera clear NECK: supple, thyroid normal size, non-tender, without nodularity LYMPH:  no palpable lymphadenopathy in the cervical, axillary  LUNGS: clear to auscultation and percussion with normal breathing effort HEART: regular rate & rhythm and no murmurs and no lower extremity edema ABDOMEN:abdomen soft, non-tender and normal bowel sounds Musculoskeletal:no cyanosis of digits and no clubbing  NEURO: alert & oriented x 3 with fluent speech, no focal motor/sensory deficits  LABORATORY DATA:  I have reviewed the data as listed    Latest Ref Rng & Units 07/26/2022   10:11 AM 07/11/2022    9:50 AM 07/05/2022   11:16 AM  CBC  WBC 4.0 - 10.5 K/uL 8.2  3.5  6.6   Hemoglobin 12.0 - 15.0 g/dL 10.3  10.6  11.1    Hematocrit 36.0 - 46.0 % 30.8  31.7  33.4   Platelets 150 - 400 K/uL 279  143  264         Latest Ref Rng & Units 07/26/2022   10:11 AM 07/11/2022    9:50 AM 07/05/2022   12:03 PM  CMP  Glucose 70 - 99 mg/dL 98  96  90   BUN 6 - 20 mg/dL '16  15  16   ' Creatinine 0.44 - 1.00 mg/dL 0.95  0.72  0.89   Sodium 135 - 145 mmol/L 136  136  136   Potassium 3.5 - 5.1 mmol/L 4.0  3.7  3.8  Chloride 98 - 111 mmol/L 102  102  104   CO2 22 - 32 mmol/L '27  29  29   ' Calcium 8.9 - 10.3 mg/dL 10.3  10.1  10.3   Total Protein 6.5 - 8.1 g/dL 8.1  7.6  7.4   Total Bilirubin 0.3 - 1.2 mg/dL 0.2  0.3  0.3   Alkaline Phos 38 - 126 U/L 96  102  105   AST 15 - 41 U/L '23  20  16   ' ALT 0 - 44 U/L '22  19  17       ' RADIOGRAPHIC STUDIES: I have personally reviewed the radiological images as listed and agreed with the findings in the report. No results found.    No orders of the defined types were placed in this encounter.  All questions were answered. The patient knows to call the clinic with any problems, questions or concerns. No barriers to learning was detected. The total time spent in the appointment was 30 minutes.     Truitt Merle, MD 07/26/2022   I, Wilburn Mylar, am acting as scribe for Truitt Merle, MD.   I have reviewed the above documentation for accuracy and completeness, and I agree with the above.

## 2022-07-26 ENCOUNTER — Inpatient Hospital Stay: Payer: Medicaid Other

## 2022-07-26 ENCOUNTER — Inpatient Hospital Stay: Payer: Medicaid Other | Attending: Hematology | Admitting: Hematology

## 2022-07-26 ENCOUNTER — Encounter: Payer: Self-pay | Admitting: Hematology

## 2022-07-26 ENCOUNTER — Other Ambulatory Visit: Payer: Self-pay

## 2022-07-26 VITALS — BP 109/70 | HR 80 | Temp 97.8°F | Resp 15 | Wt 275.4 lb

## 2022-07-26 DIAGNOSIS — R109 Unspecified abdominal pain: Secondary | ICD-10-CM | POA: Insufficient documentation

## 2022-07-26 DIAGNOSIS — Z5111 Encounter for antineoplastic chemotherapy: Secondary | ICD-10-CM | POA: Insufficient documentation

## 2022-07-26 DIAGNOSIS — E7800 Pure hypercholesterolemia, unspecified: Secondary | ICD-10-CM | POA: Insufficient documentation

## 2022-07-26 DIAGNOSIS — Z79899 Other long term (current) drug therapy: Secondary | ICD-10-CM | POA: Diagnosis not present

## 2022-07-26 DIAGNOSIS — Z803 Family history of malignant neoplasm of breast: Secondary | ICD-10-CM | POA: Insufficient documentation

## 2022-07-26 DIAGNOSIS — Z171 Estrogen receptor negative status [ER-]: Secondary | ICD-10-CM

## 2022-07-26 DIAGNOSIS — R112 Nausea with vomiting, unspecified: Secondary | ICD-10-CM | POA: Insufficient documentation

## 2022-07-26 DIAGNOSIS — F419 Anxiety disorder, unspecified: Secondary | ICD-10-CM | POA: Diagnosis not present

## 2022-07-26 DIAGNOSIS — C50412 Malignant neoplasm of upper-outer quadrant of left female breast: Secondary | ICD-10-CM | POA: Diagnosis not present

## 2022-07-26 DIAGNOSIS — T451X5A Adverse effect of antineoplastic and immunosuppressive drugs, initial encounter: Secondary | ICD-10-CM | POA: Diagnosis not present

## 2022-07-26 DIAGNOSIS — G620 Drug-induced polyneuropathy: Secondary | ICD-10-CM | POA: Insufficient documentation

## 2022-07-26 DIAGNOSIS — Z95828 Presence of other vascular implants and grafts: Secondary | ICD-10-CM

## 2022-07-26 DIAGNOSIS — Z8042 Family history of malignant neoplasm of prostate: Secondary | ICD-10-CM | POA: Insufficient documentation

## 2022-07-26 DIAGNOSIS — G4700 Insomnia, unspecified: Secondary | ICD-10-CM | POA: Insufficient documentation

## 2022-07-26 LAB — CBC WITH DIFFERENTIAL (CANCER CENTER ONLY)
Abs Immature Granulocytes: 0.09 10*3/uL — ABNORMAL HIGH (ref 0.00–0.07)
Basophils Absolute: 0 10*3/uL (ref 0.0–0.1)
Basophils Relative: 0 %
Eosinophils Absolute: 0 10*3/uL (ref 0.0–0.5)
Eosinophils Relative: 0 %
HCT: 30.8 % — ABNORMAL LOW (ref 36.0–46.0)
Hemoglobin: 10.3 g/dL — ABNORMAL LOW (ref 12.0–15.0)
Immature Granulocytes: 1 %
Lymphocytes Relative: 34 %
Lymphs Abs: 2.8 10*3/uL (ref 0.7–4.0)
MCH: 27.4 pg (ref 26.0–34.0)
MCHC: 33.4 g/dL (ref 30.0–36.0)
MCV: 81.9 fL (ref 80.0–100.0)
Monocytes Absolute: 0.7 10*3/uL (ref 0.1–1.0)
Monocytes Relative: 8 %
Neutro Abs: 4.6 10*3/uL (ref 1.7–7.7)
Neutrophils Relative %: 57 %
Platelet Count: 279 10*3/uL (ref 150–400)
RBC: 3.76 MIL/uL — ABNORMAL LOW (ref 3.87–5.11)
RDW: 16.3 % — ABNORMAL HIGH (ref 11.5–15.5)
WBC Count: 8.2 10*3/uL (ref 4.0–10.5)
nRBC: 0 % (ref 0.0–0.2)

## 2022-07-26 LAB — PREGNANCY, URINE: Preg Test, Ur: NEGATIVE

## 2022-07-26 LAB — CMP (CANCER CENTER ONLY)
ALT: 22 U/L (ref 0–44)
AST: 23 U/L (ref 15–41)
Albumin: 4.1 g/dL (ref 3.5–5.0)
Alkaline Phosphatase: 96 U/L (ref 38–126)
Anion gap: 7 (ref 5–15)
BUN: 16 mg/dL (ref 6–20)
CO2: 27 mmol/L (ref 22–32)
Calcium: 10.3 mg/dL (ref 8.9–10.3)
Chloride: 102 mmol/L (ref 98–111)
Creatinine: 0.95 mg/dL (ref 0.44–1.00)
GFR, Estimated: >60 mL/min (ref >60)
Glucose, Bld: 98 mg/dL (ref 70–99)
Potassium: 4 mmol/L (ref 3.5–5.1)
Sodium: 136 mmol/L (ref 135–145)
Total Bilirubin: 0.2 mg/dL — ABNORMAL LOW (ref 0.3–1.2)
Total Protein: 8.1 g/dL (ref 6.5–8.1)

## 2022-07-26 MED ORDER — SODIUM CHLORIDE 0.9 % IV SOLN
200.0000 mg | Freq: Once | INTRAVENOUS | Status: AC
  Administered 2022-07-26: 200 mg via INTRAVENOUS
  Filled 2022-07-26: qty 8

## 2022-07-26 MED ORDER — SODIUM CHLORIDE 0.9% FLUSH
10.0000 mL | INTRAVENOUS | Status: DC | PRN
  Administered 2022-07-26: 10 mL

## 2022-07-26 MED ORDER — SODIUM CHLORIDE 0.9 % IV SOLN
750.0000 mg | Freq: Once | INTRAVENOUS | Status: AC
  Administered 2022-07-26: 750 mg via INTRAVENOUS
  Filled 2022-07-26: qty 75

## 2022-07-26 MED ORDER — SODIUM CHLORIDE 0.9% FLUSH
10.0000 mL | Freq: Once | INTRAVENOUS | Status: AC
  Administered 2022-07-26: 10 mL

## 2022-07-26 MED ORDER — PALONOSETRON HCL INJECTION 0.25 MG/5ML
0.2500 mg | Freq: Once | INTRAVENOUS | Status: AC
  Administered 2022-07-26: 0.25 mg via INTRAVENOUS
  Filled 2022-07-26: qty 5

## 2022-07-26 MED ORDER — PACLITAXEL PROTEIN-BOUND CHEMO INJECTION 100 MG
60.0000 mg/m2 | Freq: Once | INTRAVENOUS | Status: AC
  Administered 2022-07-26: 150 mg via INTRAVENOUS
  Filled 2022-07-26: qty 30

## 2022-07-26 MED ORDER — SODIUM CHLORIDE 0.9 % IV SOLN: Freq: Once | INTRAVENOUS | Status: AC

## 2022-07-26 MED ORDER — SODIUM CHLORIDE 0.9 % IV SOLN
150.0000 mg | Freq: Once | INTRAVENOUS | Status: AC
  Administered 2022-07-26: 150 mg via INTRAVENOUS
  Filled 2022-07-26: qty 150

## 2022-07-26 MED ORDER — PROCHLORPERAZINE MALEATE 10 MG PO TABS
10.0000 mg | ORAL_TABLET | Freq: Four times a day (QID) | ORAL | Status: DC | PRN
  Administered 2022-07-26: 10 mg via ORAL
  Filled 2022-07-26: qty 1

## 2022-07-26 MED ORDER — HEPARIN SOD (PORK) LOCK FLUSH 100 UNIT/ML IV SOLN
500.0000 [IU] | Freq: Once | INTRAVENOUS | Status: AC | PRN
  Administered 2022-07-26: 500 [IU]

## 2022-07-26 NOTE — Patient Instructions (Signed)
[facility]   Discharge Instructions: Thank you for choosing [facility] to provide your oncology and hematology care.   If you have a lab appointment with the [facility], please go directly to the [facility] and check in at the registration area.   Wear comfortable clothing and clothing appropriate for easy access to any Portacath or PICC line.   We strive to give you quality time with your provider. You may need to reschedule your appointment if you arrive late (15 or more minutes).  Arriving late affects you and other patients whose appointments are after yours.  Also, if you miss three or more appointments without notifying the office, you may be dismissed from the clinic at the provider's discretion.      For prescription refill requests, have your pharmacy contact our office and allow 72 hours for refills to be completed.    Today you received the following chemotherapy and/or immunotherapy agents: pembrolizumab, paclitaxel-protein bound, carboplatin.      To help prevent nausea and vomiting after your treatment, we encourage you to take your nausea medication as directed.  BELOW ARE SYMPTOMS THAT SHOULD BE REPORTED IMMEDIATELY: *FEVER GREATER THAN 100.4 F (38 C) OR HIGHER *CHILLS OR SWEATING *NAUSEA AND VOMITING THAT IS NOT CONTROLLED WITH YOUR NAUSEA MEDICATION *UNUSUAL SHORTNESS OF BREATH *UNUSUAL BRUISING OR BLEEDING *URINARY PROBLEMS (pain or burning when urinating, or frequent urination) *BOWEL PROBLEMS (unusual diarrhea, constipation, pain near the anus) TENDERNESS IN MOUTH AND THROAT WITH OR WITHOUT PRESENCE OF ULCERS (sore throat, sores in mouth, or a toothache) UNUSUAL RASH, SWELLING OR PAIN  UNUSUAL VAGINAL DISCHARGE OR ITCHING   Items with * indicate a potential emergency and should be followed up as soon as possible or go to the Emergency Department if any problems should occur.  Please show the CHEMOTHERAPY ALERT  CARD or IMMUNOTHERAPY ALERT CARD at check-in to the Emergency Department and triage nurse.  Should you have questions after your visit or need to cancel or reschedule your appointment, please contact [facility]  Dept: [phone]  and follow the prompts.  Office hours are 8:00 a.m. to 4:30 p.m. Monday - Friday. Please note that voicemails left after 4:00 p.m. may not be returned until the following business day.  We are closed weekends and major holidays. You have access to a nurse at all times for urgent questions. Please call the main number to the clinic Dept: [phone] and follow the prompts.   For any non-urgent questions, you may also contact your provider using MyChart. We now offer e-Visits for anyone 57 and older to request care online for non-urgent symptoms. For details visit [email or web address].   Also download the MyChart app! Go to the app store, search "MyChart", open the app, select [facility], and log in with your MyChart username and password.  Masks are optional in the cancer centers. If you would like for your care team to wear a mask while they are taking care of you, please let them know. You may have one support person who is at least 32 years old accompany you for your appointments.

## 2022-08-01 ENCOUNTER — Telehealth: Payer: Self-pay

## 2022-08-01 ENCOUNTER — Encounter: Payer: Self-pay | Admitting: *Deleted

## 2022-08-01 NOTE — Telephone Encounter (Signed)
Pt LVM stating she's been have severe stomach pain along w/nausea and vomiting since her chemo infusion on Friday of last week.  Pt stated the pain started that Friday night and then Saturday the nausea started.  Pt stated she's taking her antiemetics but they are not helping.  Pt requested to speak with Dr. Burr Medico.  Tried calling pt back twice unfortunately got pt's voicemail each time.  LVM requesting pt call Dr. Ernestina Penna office.  This RN will also send pt a [employer] as well.

## 2022-08-02 ENCOUNTER — Other Ambulatory Visit: Payer: Self-pay

## 2022-08-02 ENCOUNTER — Inpatient Hospital Stay: Payer: Medicaid Other

## 2022-08-02 ENCOUNTER — Encounter: Payer: Self-pay | Admitting: Hematology

## 2022-08-02 ENCOUNTER — Inpatient Hospital Stay (HOSPITAL_BASED_OUTPATIENT_CLINIC_OR_DEPARTMENT_OTHER): Payer: Medicaid Other | Admitting: Hematology

## 2022-08-02 VITALS — BP 113/79 | HR 81 | Resp 16

## 2022-08-02 VITALS — BP 120/76 | HR 76 | Temp 98.2°F | Resp 18 | Ht 70.0 in | Wt 272.2 lb

## 2022-08-02 DIAGNOSIS — C50412 Malignant neoplasm of upper-outer quadrant of left female breast: Secondary | ICD-10-CM | POA: Diagnosis not present

## 2022-08-02 DIAGNOSIS — Z95828 Presence of other vascular implants and grafts: Secondary | ICD-10-CM

## 2022-08-02 DIAGNOSIS — Z171 Estrogen receptor negative status [ER-]: Secondary | ICD-10-CM | POA: Diagnosis not present

## 2022-08-02 DIAGNOSIS — Z5111 Encounter for antineoplastic chemotherapy: Secondary | ICD-10-CM | POA: Diagnosis not present

## 2022-08-02 LAB — PREGNANCY, URINE: Preg Test, Ur: NEGATIVE

## 2022-08-02 LAB — CMP (CANCER CENTER ONLY)
ALT: 20 U/L (ref 0–44)
AST: 27 U/L (ref 15–41)
Albumin: 3.8 g/dL (ref 3.5–5.0)
Alkaline Phosphatase: 102 U/L (ref 38–126)
Anion gap: 6 (ref 5–15)
BUN: 13 mg/dL (ref 6–20)
CO2: 29 mmol/L (ref 22–32)
Calcium: 9.9 mg/dL (ref 8.9–10.3)
Chloride: 102 mmol/L (ref 98–111)
Creatinine: 0.87 mg/dL (ref 0.44–1.00)
GFR, Estimated: >60 mL/min (ref >60)
Glucose, Bld: 93 mg/dL (ref 70–99)
Potassium: 3.2 mmol/L — ABNORMAL LOW (ref 3.5–5.1)
Sodium: 137 mmol/L (ref 135–145)
Total Bilirubin: 0.3 mg/dL (ref 0.3–1.2)
Total Protein: 7.7 g/dL (ref 6.5–8.1)

## 2022-08-02 LAB — CBC WITH DIFFERENTIAL (CANCER CENTER ONLY)
Abs Immature Granulocytes: 0.04 10*3/uL (ref 0.00–0.07)
Basophils Absolute: 0 10*3/uL (ref 0.0–0.1)
Basophils Relative: 0 %
Eosinophils Absolute: 0 10*3/uL (ref 0.0–0.5)
Eosinophils Relative: 0 %
HCT: 28.9 % — ABNORMAL LOW (ref 36.0–46.0)
Hemoglobin: 9.5 g/dL — ABNORMAL LOW (ref 12.0–15.0)
Immature Granulocytes: 1 %
Lymphocytes Relative: 44 %
Lymphs Abs: 2.5 10*3/uL (ref 0.7–4.0)
MCH: 27.5 pg (ref 26.0–34.0)
MCHC: 32.9 g/dL (ref 30.0–36.0)
MCV: 83.8 fL (ref 80.0–100.0)
Monocytes Absolute: 0.3 10*3/uL (ref 0.1–1.0)
Monocytes Relative: 6 %
Neutro Abs: 2.8 10*3/uL (ref 1.7–7.7)
Neutrophils Relative %: 49 %
Platelet Count: 415 10*3/uL — ABNORMAL HIGH (ref 150–400)
RBC: 3.45 MIL/uL — ABNORMAL LOW (ref 3.87–5.11)
RDW: 16.1 % — ABNORMAL HIGH (ref 11.5–15.5)
WBC Count: 5.7 10*3/uL (ref 4.0–10.5)
nRBC: 0 % (ref 0.0–0.2)

## 2022-08-02 MED ORDER — HEPARIN SOD (PORK) LOCK FLUSH 100 UNIT/ML IV SOLN
500.0000 [IU] | Freq: Once | INTRAVENOUS | Status: AC | PRN
  Administered 2022-08-02: 500 [IU]

## 2022-08-02 MED ORDER — PROCHLORPERAZINE MALEATE 10 MG PO TABS
10.0000 mg | ORAL_TABLET | Freq: Four times a day (QID) | ORAL | Status: DC | PRN
  Administered 2022-08-02: 10 mg via ORAL
  Filled 2022-08-02: qty 1

## 2022-08-02 MED ORDER — GOSERELIN ACETATE 3.6 MG ~~LOC~~ IMPL
3.6000 mg | DRUG_IMPLANT | Freq: Once | SUBCUTANEOUS | Status: AC
  Administered 2022-08-02: 3.6 mg via SUBCUTANEOUS
  Filled 2022-08-02: qty 3.6

## 2022-08-02 MED ORDER — ZOLPIDEM TARTRATE ER 12.5 MG PO TBCR: 12.5000 mg | EXTENDED_RELEASE_TABLET | Freq: Every evening | ORAL | 0 refills | Status: DC | PRN

## 2022-08-02 MED ORDER — SODIUM CHLORIDE 0.9 % IV SOLN: Freq: Once | INTRAVENOUS | Status: AC

## 2022-08-02 MED ORDER — PACLITAXEL PROTEIN-BOUND CHEMO INJECTION 100 MG
60.0000 mg/m2 | Freq: Once | INTRAVENOUS | Status: AC
  Administered 2022-08-02: 150 mg via INTRAVENOUS
  Filled 2022-08-02: qty 30

## 2022-08-02 MED ORDER — PROMETHAZINE HCL 25 MG PO TABS: 25.0000 mg | ORAL_TABLET | Freq: Four times a day (QID) | ORAL | 0 refills | Status: DC | PRN

## 2022-08-02 MED ORDER — SODIUM CHLORIDE 0.9% FLUSH
10.0000 mL | Freq: Once | INTRAVENOUS | Status: AC
  Administered 2022-08-02: 10 mL

## 2022-08-02 MED ORDER — SODIUM CHLORIDE 0.9% FLUSH
10.0000 mL | INTRAVENOUS | Status: DC | PRN
  Administered 2022-08-02: 10 mL

## 2022-08-02 NOTE — Patient Instructions (Signed)
[facility]   Discharge Instructions: Thank you for choosing [facility] to provide your oncology and hematology care.   If you have a lab appointment with the [facility], please go directly to the [facility] and check in at the registration area.   Wear comfortable clothing and clothing appropriate for easy access to any Portacath or PICC line.   We strive to give you quality time with your provider. You may need to reschedule your appointment if you arrive late (15 or more minutes).  Arriving late affects you and other patients whose appointments are after yours.  Also, if you miss three or more appointments without notifying the office, you may be dismissed from the clinic at the provider's discretion.      For prescription refill requests, have your pharmacy contact our office and allow 72 hours for refills to be completed.    Today you received the following chemotherapy and/or immunotherapy agents: paclitaxel-protein bound (Abraxane)      To help prevent nausea and vomiting after your treatment, we encourage you to take your nausea medication as directed.  BELOW ARE SYMPTOMS THAT SHOULD BE REPORTED IMMEDIATELY: *FEVER GREATER THAN 100.4 F (38 C) OR HIGHER *CHILLS OR SWEATING *NAUSEA AND VOMITING THAT IS NOT CONTROLLED WITH YOUR NAUSEA MEDICATION *UNUSUAL SHORTNESS OF BREATH *UNUSUAL BRUISING OR BLEEDING *URINARY PROBLEMS (pain or burning when urinating, or frequent urination) *BOWEL PROBLEMS (unusual diarrhea, constipation, pain near the anus) TENDERNESS IN MOUTH AND THROAT WITH OR WITHOUT PRESENCE OF ULCERS (sore throat, sores in mouth, or a toothache) UNUSUAL RASH, SWELLING OR PAIN  UNUSUAL VAGINAL DISCHARGE OR ITCHING   Items with * indicate a potential emergency and should be followed up as soon as possible or go to the Emergency Department if any problems should occur.  Please show the CHEMOTHERAPY ALERT CARD or  IMMUNOTHERAPY ALERT CARD at check-in to the Emergency Department and triage nurse.  Should you have questions after your visit or need to cancel or reschedule your appointment, please contact [facility]  Dept: [phone]  and follow the prompts.  Office hours are 8:00 a.m. to 4:30 p.m. Monday - Friday. Please note that voicemails left after 4:00 p.m. may not be returned until the following business day.  We are closed weekends and major holidays. You have access to a nurse at all times for urgent questions. Please call the main number to the clinic Dept: [phone] and follow the prompts.   For any non-urgent questions, you may also contact your provider using MyChart. We now offer e-Visits for anyone 66 and older to request care online for non-urgent symptoms. For details visit [email or web address].   Also download the MyChart app! Go to the app store, search "MyChart", open the app, select [facility], and log in with your MyChart username and password.  Masks are optional in the cancer centers. If you would like for your care team to wear a mask while they are taking care of you, please let them know. You may have one support person who is at least 31 years old accompany you for your appointments.

## 2022-08-02 NOTE — Progress Notes (Signed)
Patient declined to use cryotherapy to prevent worsening of neuropathy. Patient encouraged to use but she reported she was "just too tired today".

## 2022-08-02 NOTE — Progress Notes (Signed)
[name]   Telephone:([phone] Fax:([phone]) [phone]   Clinic Follow up Note   Patient Care Team: Trey Sailors, PA as PCP - General (Physician Assistant) Rockwell Germany, RN as Oncology Nurse Navigator Mauro Kaufmann, RN as Oncology Nurse Navigator Rolm Bookbinder, MD as Consulting Physician (General Surgery) Truitt Merle, MD as Consulting Physician (Hematology) Kyung Rudd, MD as Consulting Physician (Radiation Oncology) Latanya Maudlin, MD as Consulting Physician (Orthopedic Surgery)  Date of Service:  08/02/2022  CHIEF COMPLAINT: chemo toxicities, f/u of left breast cancer  CURRENT THERAPY:  Adjuvant carboplatin/taxol, weekly, starting 06/14/22             -taxol switched to abraxane from C2D8 [name], starting 06/14/22  ASSESSMENT & PLAN:  Gabrielle [name] is a 31 y.o. pre-menopausal female with   1. Chemo toxicities: Nausea with vomiting, Stomach pain, Neuropathy -she reports her symptoms lasted longer than usual with this past cycle. She was unable to keep anything down for a day or two. -she is much improved today with minimal remaining side effects. -I will call in phenergan for her to use next cycle.  2. 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 Biopsy 03/11/22 showed metaplastic carcinoma. -baseline echo 03/25/22 was normal (EF 60-65%). -staging CT CAP and bone scan 03/26/22 negative for disease outside of the breast. 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 SLN biopsy on 05/14/22 was negative (0/4). -given large tumor size and young age, adjuvant chemo is recommended. Plan for 3 months of weekly carboplatin/taxol, followed by 4 cycles of AC every 3 weeks, with Beryle Flock  -she started zoladex on 06/03/22. -she began chemotherapy  with carboplatin/taxol and Keytruda on 06/14/22. She has developed whole-body itching and mild numbness since last treatment. We switched her Taxol to abraxane with C2D8. 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   3. Anxiety, Insomnia -she reports a history of anxiety, relieved with marijuana -I prescribed Ambien on 04/10/22 for her to use as needed. She reports continued issues sleeping; I will call in slow release ambien.   4. Family History, Genetics -she has a strong family history of breast cancer, in both sides of her family. (Of note, Dr. Donne Hazel is also her mother's surgeon) -genetic testing done on 03/20/22 was negative.     Plan -proceed with C3D8 abraxane today and in one week -I called in phenergan and long-acting ambien -lab, flush, f/u and chemo and Keytruda in 2 weeks   No problem-specific Assessment & Plan notes found for this encounter.   SUMMARY OF ONCOLOGIC HISTORY: Oncology History Overview Note   Cancer Staging  Malignant neoplasm of upper-outer quadrant of left breast in female, estrogen receptor negative ([facility]) Staging form: Breast, AJCC 8th Edition - Clinical stage from 03/11/2022: Stage IIB (cT2, cN0, cM0, G3, ER-, PR-, HER2-) - Signed by Truitt Merle, MD on 03/19/2022    Malignant neoplasm of upper-outer quadrant of left breast in female, estrogen receptor negative ([facility])  03/08/2022 Mammogram   CLINICAL DATA:  31 year old female presenting for evaluation of a palpable lump in the left breast which she feels has increased in size since she first identified it. Her mother was diagnosed with breast cancer within the last 6 months. She also has family history of breast cancer in multiple aunts and  her maternal grandmother.   EXAM: DIGITAL DIAGNOSTIC BILATERAL MAMMOGRAM WITH TOMOSYNTHESIS AND CAD; ULTRASOUND LEFT BREAST LIMITED  IMPRESSION: 1. There is a suspicious 3.7 cm mass in the left breast at 12 o'clock.   2.   No evidence of left axillary lymphadenopathy.   3.  No evidence of malignancy in the right breast.   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 Based on the biopsy, the carcinoma appears Nottingham grade 3 of 3 and measures 1.2 cm in greatest linear extent.  PROGNOSTIC INDICATORS Results: The tumor cells are NEGATIVE for Her2 (0). Estrogen Receptor: 0%, NEGATIVE Progesterone Receptor: 0%, NEGATIVE Proliferation Marker Ki67: 40%   03/15/2022 Initial Diagnosis   Malignant neoplasm of upper-outer quadrant of left breast in female, estrogen receptor negative ([facility])   03/28/2022 Genetic Testing   Negative hereditary cancer genetic testing: no pathogenic variants detected in [facility] BRCAPlus Panel or [facility] CustomNext-Cancer +RNAinsight Panel.  Report dates are 03/28/2022 and 03/31/2022. Marland Kitchen   The BRCAplus panel offered by [employer] and includes sequencing and deletion/duplication analysis for the following 8 genes: ATM, BRCA1, BRCA2, CDH1, CHEK2, PALB2, PTEN, and TP53.  The CustomNext-Cancer+RNAinsight panel offered by [facility] includes sequencing and rearrangement analysis for the following 47 genes:  APC, ATM, AXIN2, BARD1, BMPR1A, BRCA1, BRCA2, BRIP1, CDH1, CDK4, CDKN2A, CHEK2, DICER1, EPCAM, GREM1, HOXB13, MEN1, MLH1, MSH2, MSH3, MSH6, MUTYH, NBN, NF1, NF2, NTHL1, PALB2, PMS2, POLD1, POLE, PTEN, RAD51C, RAD51D, RECQL, RET, SDHA, SDHAF2, SDHB, SDHC, SDHD, SMAD4, SMARCA4, STK11, TP53, TSC1, TSC2, and VHL.  RNA data  is routinely analyzed for use in variant interpretation for all genes.   05/14/2022 Cancer Staging   Staging form: Breast, AJCC 8th Edition - Pathologic stage from 05/14/2022: Stage IIA (pT2, pN0, cM0, G3, ER-, PR-, HER2-) - Signed by Truitt Merle, MD on 05/28/2022 Stage prefix: Initial diagnosis Histologic grading system: 3 grade system Residual tumor (R): R0 - None   06/13/2022 -  Chemotherapy   Patient is on Treatment Plan : BREAST Pembrolizumab (200) D1 + Carboplatin (1.5) D1,8,15 + Paclitaxel (80) D1,8,15 q21d X 4 cycles / Pembrolizumab (200) D1 + AC D1 q21d x 4 cycles     06/14/2022 - 07/05/2022 Chemotherapy   Patient is on Treatment Plan : BREAST Pembrolizumab (200) D1 + Carboplatin (5) D1 + Paclitaxel (80) D1,8,15 q21d X 4 cycles / Pembrolizumab (200) D1 + AC D1 q21d x 4 cycles        INTERVAL HISTORY:  Gabrielle [name] is here for a follow up of breast cancer. She was last seen by me on 07/26/22. She presents to the clinic alone. She reports her symptoms have much improved today. She explains her nausea usually resolves by day 4, but it persisted this time and left her unable to keep anything down for several days. She reports her neuropathy is also improved. She notes she is still having issues sleeping, that she is just up at night walking around.   All other systems were reviewed with the patient and are negative.  MEDICAL HISTORY:  Past Medical History:  Diagnosis Date   Arthritis    Bilateral Knees   Cancer ([facility]) 03/11/2022   Breast   Family history of breast cancer 03/21/2022   Family history of prostate cancer 03/21/2022   Hypercholesteremia     SURGICAL HISTORY: Past Surgical History:  Procedure Laterality Date   AXILLARY SENTINEL NODE BIOPSY Left 05/14/2022   Procedure: LEFT AXILLARY SENTINEL NODE BIOPSY;  Surgeon: Rolm Bookbinder, MD;  Location: [facility];  Service: General;  Laterality: Left;  GEN w/ PEC BLOCK   BREAST BIOPSY Left 03/11/2022   BREAST LUMPECTOMY WITH  AXILLARY LYMPH NODE BIOPSY Left 04/16/2022   Procedure: LEFT BREAST LUMPECTOMY WITH LEFT AXILLARY SENTINEL LYMPH NODE BIOPSY;  Surgeon: Rolm Bookbinder, MD;  Location: [facility];  Service: General;  Laterality: Left;   DENTAL SURGERY     PORTACATH PLACEMENT Right 04/16/2022   Procedure: INSERTION PORT-A-CATH;  Surgeon: Rolm Bookbinder, MD;  Location: [facility];  Service: General;  Laterality: Right;    I have reviewed the social history and family history with the patient and they are unchanged from previous note.  ALLERGIES:  has No Known Allergies.  MEDICATIONS:  Current Outpatient Medications  Medication Sig Dispense Refill   promethazine (PHENERGAN) 25 MG tablet Take 1 tablet (25 mg total) by mouth every 6 (six) hours as needed for nausea or vomiting. 30 tablet 0   zolpidem (AMBIEN CR) 12.5 MG CR tablet Take 1 tablet (12.5 mg total) by mouth at bedtime as needed for sleep. 30 tablet 0   acetaminophen (TYLENOL) 500 MG tablet Take 1,000 mg by mouth every 6 (six) hours as needed (pain.).     gabapentin (NEURONTIN) 100 MG capsule Take 1-2 capsules (100-200 mg total) by mouth 2 (two) times daily. 60 capsule 0   ibuprofen (ADVIL) 200 MG tablet Take 200 mg by mouth every 8 (eight) hours as needed (pain).     prochlorperazine (COMPAZINE) 10 MG tablet Take 1 tablet (10 mg total) by mouth every 6 (six) hours as needed for nausea or vomiting. 30 tablet 0   traMADol (ULTRAM) 50 MG tablet Take 1 tablet (50 mg total) by mouth every 6 (six) hours as needed. 60 tablet 0   No current facility-administered medications for this visit.   Facility-Administered Medications Ordered in Other Visits  Medication Dose Route Frequency Provider Last Rate Last Admin   diphenhydrAMINE (BENADRYL) 50 MG/ML injection            famotidine (PEPCID) 20-0.9 MG/50ML-% IVPB            prochlorperazine (COMPAZINE) tablet 10 mg  10 mg Oral Q6H PRN Truitt Merle, MD   10 mg at 08/02/22 1401    sodium chloride flush (NS) 0.9 % injection 10 mL  10 mL Intracatheter PRN Truitt Merle, MD   10 mL at 08/02/22 1544    PHYSICAL EXAMINATION: ECOG PERFORMANCE STATUS: 2 - Symptomatic, <50% confined to bed  Vitals:   08/02/22 1313  BP: 120/76  Pulse: 76  Resp: 18  Temp: 98.2 F (36.8 C)  SpO2: 100%   Wt Readings from Last 3 Encounters:  08/02/22 272 lb 3.2 oz (123.5 kg)  07/26/22 275 lb 6.4 oz (124.9 kg)  07/11/22 274 lb 9.6 oz (124.6 kg)     GENERAL:alert, no distress and comfortable SKIN: skin color normal, no rashes or significant lesions EYES: normal, Conjunctiva are pink and non-injected, sclera clear  NEURO: alert & oriented x 3 with fluent speech  LABORATORY DATA:  I  have reviewed the data as listed    Latest Ref Rng & Units 08/02/2022    1:01 PM 07/26/2022   10:11 AM 07/11/2022    9:50 AM  CBC  WBC 4.0 - 10.5 K/uL 5.7  8.2  3.5   Hemoglobin 12.0 - 15.0 g/dL 9.5  10.3  10.6   Hematocrit 36.0 - 46.0 % 28.9  30.8  31.7   Platelets 150 - 400 K/uL 415  279  143         Latest Ref Rng & Units 08/02/2022    1:01 PM 07/26/2022   10:11 AM 07/11/2022    9:50 AM  CMP  Glucose 70 - 99 mg/dL 93  98  96   BUN 6 - 20 mg/dL _0 Creatinine 0.44 - 1.00 mg/dL 0.87  0.95  0.72   Sodium 135 - 145 mmol/L 137  136  136   Potassium 3.5 - 5.1 mmol/L 3.2  4.0  3.7   Chloride 98 - 111 mmol/L 102  102  102   CO2 22 - 32 mmol/L _1 Calcium 8.9 - 10.3 mg/dL 9.9  10.3  10.1   Total Protein 6.5 - 8.1 g/dL 7.7  8.1  7.6   Total Bilirubin 0.3 - 1.2 mg/dL 0.3  0.2  0.3   Alkaline Phos 38 - 126 U/L 102  96  102   AST 15 - 41 U/L _2 ALT 0 - 44 U/L _3 RADIOGRAPHIC STUDIES: I have personally reviewed the radiological images as listed and agreed with the findings in the report. No results found.    No orders of the defined types were placed in this encounter.  All questions were answered. The patient knows to call the clinic with any problems,  questions or concerns. No barriers to learning was detected. The total time spent in the appointment was 30 minutes.     Truitt Merle, MD 08/02/2022   I, Wilburn Mylar, am acting as scribe for Truitt Merle, MD.   I have reviewed the above documentation for accuracy and completeness, and I agree with the above.

## 2022-08-07 ENCOUNTER — Telehealth: Payer: Self-pay | Admitting: Hematology

## 2022-08-07 NOTE — Telephone Encounter (Signed)
Left message with follow-up appointments per appointment request workqueue. 

## 2022-08-08 ENCOUNTER — Other Ambulatory Visit: Payer: Self-pay

## 2022-08-09 ENCOUNTER — Inpatient Hospital Stay: Payer: Medicaid Other

## 2022-08-09 ENCOUNTER — Inpatient Hospital Stay: Payer: Medicaid Other | Admitting: Hematology

## 2022-08-09 ENCOUNTER — Ambulatory Visit: Payer: Medicaid Other | Admitting: Dietician

## 2022-08-09 ENCOUNTER — Other Ambulatory Visit: Payer: Self-pay | Admitting: Hematology

## 2022-08-09 ENCOUNTER — Other Ambulatory Visit: Payer: Self-pay

## 2022-08-09 ENCOUNTER — Encounter: Payer: Self-pay | Admitting: *Deleted

## 2022-08-09 VITALS — BP 112/72 | HR 79 | Temp 98.3°F | Resp 18 | Wt 268.0 lb

## 2022-08-09 DIAGNOSIS — Z95828 Presence of other vascular implants and grafts: Secondary | ICD-10-CM

## 2022-08-09 DIAGNOSIS — Z5111 Encounter for antineoplastic chemotherapy: Secondary | ICD-10-CM | POA: Diagnosis not present

## 2022-08-09 DIAGNOSIS — Z171 Estrogen receptor negative status [ER-]: Secondary | ICD-10-CM

## 2022-08-09 LAB — CBC WITH DIFFERENTIAL (CANCER CENTER ONLY)
Abs Immature Granulocytes: 0.02 10*3/uL (ref 0.00–0.07)
Basophils Absolute: 0 10*3/uL (ref 0.0–0.1)
Basophils Relative: 0 %
Eosinophils Absolute: 0 10*3/uL (ref 0.0–0.5)
Eosinophils Relative: 0 %
HCT: 25.9 % — ABNORMAL LOW (ref 36.0–46.0)
Hemoglobin: 8.5 g/dL — ABNORMAL LOW (ref 12.0–15.0)
Immature Granulocytes: 1 %
Lymphocytes Relative: 47 %
Lymphs Abs: 2.1 10*3/uL (ref 0.7–4.0)
MCH: 27.2 pg (ref 26.0–34.0)
MCHC: 32.8 g/dL (ref 30.0–36.0)
MCV: 82.7 fL (ref 80.0–100.0)
Monocytes Absolute: 0.5 10*3/uL (ref 0.1–1.0)
Monocytes Relative: 11 %
Neutro Abs: 1.8 10*3/uL (ref 1.7–7.7)
Neutrophils Relative %: 41 %
Platelet Count: 87 10*3/uL — ABNORMAL LOW (ref 150–400)
RBC: 3.13 MIL/uL — ABNORMAL LOW (ref 3.87–5.11)
RDW: 15.9 % — ABNORMAL HIGH (ref 11.5–15.5)
WBC Count: 4.4 10*3/uL (ref 4.0–10.5)
nRBC: 0 % (ref 0.0–0.2)

## 2022-08-09 LAB — CMP (CANCER CENTER ONLY)
ALT: 22 U/L (ref 0–44)
AST: 24 U/L (ref 15–41)
Albumin: 3.7 g/dL (ref 3.5–5.0)
Alkaline Phosphatase: 120 U/L (ref 38–126)
Anion gap: 5 (ref 5–15)
BUN: 12 mg/dL (ref 6–20)
CO2: 31 mmol/L (ref 22–32)
Calcium: 9.3 mg/dL (ref 8.9–10.3)
Chloride: 104 mmol/L (ref 98–111)
Creatinine: 0.9 mg/dL (ref 0.44–1.00)
GFR, Estimated: >60 mL/min (ref >60)
Glucose, Bld: 109 mg/dL — ABNORMAL HIGH (ref 70–99)
Potassium: 3.3 mmol/L — ABNORMAL LOW (ref 3.5–5.1)
Sodium: 140 mmol/L (ref 135–145)
Total Bilirubin: 0.2 mg/dL — ABNORMAL LOW (ref 0.3–1.2)
Total Protein: 6.9 g/dL (ref 6.5–8.1)

## 2022-08-09 MED ORDER — SODIUM CHLORIDE 0.9% FLUSH
10.0000 mL | INTRAVENOUS | Status: DC | PRN
  Administered 2022-08-09: 10 mL

## 2022-08-09 MED ORDER — HEPARIN SOD (PORK) LOCK FLUSH 100 UNIT/ML IV SOLN
500.0000 [IU] | Freq: Once | INTRAVENOUS | Status: AC | PRN
  Administered 2022-08-09: 500 [IU]

## 2022-08-09 MED ORDER — PACLITAXEL PROTEIN-BOUND CHEMO INJECTION 100 MG
60.0000 mg/m2 | Freq: Once | INTRAVENOUS | Status: AC
  Administered 2022-08-09: 150 mg via INTRAVENOUS
  Filled 2022-08-09: qty 30

## 2022-08-09 MED ORDER — SODIUM CHLORIDE 0.9% FLUSH
10.0000 mL | Freq: Once | INTRAVENOUS | Status: AC
  Administered 2022-08-09: 10 mL

## 2022-08-09 MED ORDER — PROCHLORPERAZINE MALEATE 10 MG PO TABS
10.0000 mg | ORAL_TABLET | Freq: Once | ORAL | Status: AC
  Administered 2022-08-09: 10 mg via ORAL
  Filled 2022-08-09: qty 1

## 2022-08-09 MED ORDER — SODIUM CHLORIDE 0.9 % IV SOLN: Freq: Once | INTRAVENOUS | Status: AC

## 2022-08-09 MED ORDER — PALONOSETRON HCL INJECTION 0.25 MG/5ML
0.2500 mg | Freq: Once | INTRAVENOUS | Status: AC
  Administered 2022-08-09: 0.25 mg via INTRAVENOUS
  Filled 2022-08-09: qty 5

## 2022-08-09 NOTE — Progress Notes (Signed)
Nutrition Assessment   Reason for Assessment: Urgent MD referral    ASSESSMENT: 31 year old female with breast cancer. She is currently receiving taxol/abraxane + keytruda q21d. Patient is followed by Dr. Burr Medico.  Met with patient during infusion. She reports nausea with vomiting episodes since starting treatment. Patient reports this is worse when receiving all 3 treatments, lasting for a week. She recalls one episode of vomiting immediately after last abraxane. She is taking antiemetics as needed. Patient is no longer tolerating milk. She reports stomach feels sour after milkshakes or eating cereal. Patient is going to try lactose free. She recalls tolerating broth, chicken salad on crackers, and Ensure when nauseas. Patient reports water taste metallic and unable to drink as much recently.   Nutrition Focused Physical Exam: deferred   Medications: reviewed    Labs: Hgb 8.5, K 3.3, glucose 109   Anthropometrics:   Height: 5'10" Weight: 268 lb  UBW: 279 lb (5/30) BMI: 38.45  6/22 - 279 lb 4.8 oz 7/28 - 276 lb 1 oz 8/24 - 274 lb 9.6 oz 9/15 - 272 lb 3.2 oz     NUTRITION DIAGNOSIS: Unintentional weight loss related to side effects of therapy as evidenced by nausea with vomiting, decreased appetite, 1.5% (4 lb) wt loss in one week; concerning  INTERVENTION:  Discussed strategies for nausea, foods best tolerated and foods to limit/avoid - handout provided Encouraged small frequent meals/snacks with adequate calories and protein - handout provided  Discussed strategies for altered taste, recommend baking soda salt water rinses several times day - handout with tips + recipe given Continue drinking Ensure Plus daily for added calories and protein - coupons + samples provided  Contact information given    MONITORING, EVALUATION, GOAL: Pt will tolerate increased calories and protein to minimize further weight loss   Next Visit: Friday October 6 during infusion

## 2022-08-09 NOTE — Patient Instructions (Signed)
[facility]   Discharge Instructions: Thank you for choosing [facility] to provide your oncology and hematology care.   If you have a lab appointment with the [facility], please go directly to the [facility] and check in at the registration area.   Wear comfortable clothing and clothing appropriate for easy access to any Portacath or PICC line.   We strive to give you quality time with your provider. You may need to reschedule your appointment if you arrive late (15 or more minutes).  Arriving late affects you and other patients whose appointments are after yours.  Also, if you miss three or more appointments without notifying the office, you may be dismissed from the clinic at the provider's discretion.      For prescription refill requests, have your pharmacy contact our office and allow 72 hours for refills to be completed.    Today you received the following chemotherapy and/or immunotherapy agents: Paclitaxel-protein bound (Abraxane)      To help prevent nausea and vomiting after your treatment, we encourage you to take your nausea medication as directed.  BELOW ARE SYMPTOMS THAT SHOULD BE REPORTED IMMEDIATELY: *FEVER GREATER THAN 100.4 F (38 C) OR HIGHER *CHILLS OR SWEATING *NAUSEA AND VOMITING THAT IS NOT CONTROLLED WITH YOUR NAUSEA MEDICATION *UNUSUAL SHORTNESS OF BREATH *UNUSUAL BRUISING OR BLEEDING *URINARY PROBLEMS (pain or burning when urinating, or frequent urination) *BOWEL PROBLEMS (unusual diarrhea, constipation, pain near the anus) TENDERNESS IN MOUTH AND THROAT WITH OR WITHOUT PRESENCE OF ULCERS (sore throat, sores in mouth, or a toothache) UNUSUAL RASH, SWELLING OR PAIN  UNUSUAL VAGINAL DISCHARGE OR ITCHING   Items with * indicate a potential emergency and should be followed up as soon as possible or go to the Emergency Department if any problems should occur.  Please show the CHEMOTHERAPY ALERT CARD or  IMMUNOTHERAPY ALERT CARD at check-in to the Emergency Department and triage nurse.  Should you have questions after your visit or need to cancel or reschedule your appointment, please contact [facility]  Dept: [phone]  and follow the prompts.  Office hours are 8:00 a.m. to 4:30 p.m. Monday - Friday. Please note that voicemails left after 4:00 p.m. may not be returned until the following business day.  We are closed weekends and major holidays. You have access to a nurse at all times for urgent questions. Please call the main number to the clinic Dept: [phone] and follow the prompts.   For any non-urgent questions, you may also contact your provider using MyChart. We now offer e-Visits for anyone 1 and older to request care online for non-urgent symptoms. For details visit [email or web address].   Also download the MyChart app! Go to the app store, search "MyChart", open the app, select [facility], and log in with your MyChart username and password.  Masks are optional in the cancer centers. If you would like for your care team to wear a mask while they are taking care of you, please let them know. You may have one support person who is at least 32 years old accompany you for your appointments.

## 2022-08-09 NOTE — Progress Notes (Signed)
Platelet 87. Ok to treat with Abraxane today. Per Dr. Burr Medico

## 2022-08-10 ENCOUNTER — Inpatient Hospital Stay: Payer: Medicaid Other

## 2022-08-10 VITALS — BP 120/75 | HR 75 | Temp 97.0°F | Resp 18 | Ht 70.0 in

## 2022-08-10 DIAGNOSIS — Z5111 Encounter for antineoplastic chemotherapy: Secondary | ICD-10-CM | POA: Diagnosis not present

## 2022-08-10 DIAGNOSIS — Z171 Estrogen receptor negative status [ER-]: Secondary | ICD-10-CM

## 2022-08-10 MED ORDER — FILGRASTIM-AAFI 480 MCG/0.8ML IJ SOSY
480.0000 ug | PREFILLED_SYRINGE | Freq: Once | INTRAMUSCULAR | Status: AC
  Administered 2022-08-10: 480 ug via SUBCUTANEOUS

## 2022-08-12 ENCOUNTER — Other Ambulatory Visit: Payer: Self-pay

## 2022-08-12 ENCOUNTER — Inpatient Hospital Stay: Payer: Medicaid Other

## 2022-08-12 DIAGNOSIS — Z171 Estrogen receptor negative status [ER-]: Secondary | ICD-10-CM

## 2022-08-12 DIAGNOSIS — Z5111 Encounter for antineoplastic chemotherapy: Secondary | ICD-10-CM | POA: Diagnosis not present

## 2022-08-12 MED ORDER — FILGRASTIM-AAFI 480 MCG/0.8ML IJ SOSY
480.0000 ug | PREFILLED_SYRINGE | Freq: Once | INTRAMUSCULAR | Status: AC
  Administered 2022-08-12: 480 ug via SUBCUTANEOUS
  Filled 2022-08-12: qty 0.8

## 2022-08-13 ENCOUNTER — Inpatient Hospital Stay: Payer: Medicaid Other

## 2022-08-13 ENCOUNTER — Other Ambulatory Visit: Payer: Self-pay

## 2022-08-13 DIAGNOSIS — Z5111 Encounter for antineoplastic chemotherapy: Secondary | ICD-10-CM | POA: Diagnosis not present

## 2022-08-13 DIAGNOSIS — C50412 Malignant neoplasm of upper-outer quadrant of left female breast: Secondary | ICD-10-CM

## 2022-08-13 MED ORDER — FILGRASTIM-AAFI 480 MCG/0.8ML IJ SOSY
480.0000 ug | PREFILLED_SYRINGE | Freq: Once | INTRAMUSCULAR | Status: AC
  Administered 2022-08-13: 480 ug via SUBCUTANEOUS
  Filled 2022-08-13: qty 0.8

## 2022-08-15 MED FILL — Fosaprepitant Dimeglumine For IV Infusion 150 MG (Base Eq): INTRAVENOUS | Qty: 5 | Status: AC

## 2022-08-16 ENCOUNTER — Inpatient Hospital Stay: Payer: Medicaid Other

## 2022-08-16 ENCOUNTER — Inpatient Hospital Stay (HOSPITAL_BASED_OUTPATIENT_CLINIC_OR_DEPARTMENT_OTHER): Payer: Medicaid Other | Admitting: Hematology

## 2022-08-16 ENCOUNTER — Other Ambulatory Visit: Payer: Self-pay

## 2022-08-16 ENCOUNTER — Encounter: Payer: Self-pay | Admitting: Hematology

## 2022-08-16 VITALS — BP 116/71 | HR 83 | Temp 98.0°F | Resp 18 | Ht 70.0 in | Wt 278.6 lb

## 2022-08-16 DIAGNOSIS — Z171 Estrogen receptor negative status [ER-]: Secondary | ICD-10-CM

## 2022-08-16 DIAGNOSIS — C50412 Malignant neoplasm of upper-outer quadrant of left female breast: Secondary | ICD-10-CM | POA: Diagnosis not present

## 2022-08-16 DIAGNOSIS — Z5111 Encounter for antineoplastic chemotherapy: Secondary | ICD-10-CM | POA: Diagnosis not present

## 2022-08-16 DIAGNOSIS — Z95828 Presence of other vascular implants and grafts: Secondary | ICD-10-CM

## 2022-08-16 LAB — COMPREHENSIVE METABOLIC PANEL
ALT: 22 U/L (ref 0–44)
AST: 18 U/L (ref 15–41)
Albumin: 4.1 g/dL (ref 3.5–5.0)
Alkaline Phosphatase: 145 U/L — ABNORMAL HIGH (ref 38–126)
Anion gap: 5 (ref 5–15)
BUN: 13 mg/dL (ref 6–20)
CO2: 30 mmol/L (ref 22–32)
Calcium: 10 mg/dL (ref 8.9–10.3)
Chloride: 104 mmol/L (ref 98–111)
Creatinine, Ser: 0.97 mg/dL (ref 0.44–1.00)
GFR, Estimated: >60 mL/min (ref >60)
Glucose, Bld: 117 mg/dL — ABNORMAL HIGH (ref 70–99)
Potassium: 3.9 mmol/L (ref 3.5–5.1)
Sodium: 139 mmol/L (ref 135–145)
Total Bilirubin: 0.2 mg/dL — ABNORMAL LOW (ref 0.3–1.2)
Total Protein: 7.8 g/dL (ref 6.5–8.1)

## 2022-08-16 LAB — CBC WITH DIFFERENTIAL/PLATELET
Abs Immature Granulocytes: 1.59 10*3/uL — ABNORMAL HIGH (ref 0.00–0.07)
Basophils Absolute: 0 10*3/uL (ref 0.0–0.1)
Basophils Relative: 0 %
Eosinophils Absolute: 0 10*3/uL (ref 0.0–0.5)
Eosinophils Relative: 0 %
HCT: 26.3 % — ABNORMAL LOW (ref 36.0–46.0)
Hemoglobin: 8.7 g/dL — ABNORMAL LOW (ref 12.0–15.0)
Immature Granulocytes: 13 %
Lymphocytes Relative: 29 %
Lymphs Abs: 3.5 10*3/uL (ref 0.7–4.0)
MCH: 27.6 pg (ref 26.0–34.0)
MCHC: 33.1 g/dL (ref 30.0–36.0)
MCV: 83.5 fL (ref 80.0–100.0)
Monocytes Absolute: 1 10*3/uL (ref 0.1–1.0)
Monocytes Relative: 8 %
Neutro Abs: 5.9 10*3/uL (ref 1.7–7.7)
Neutrophils Relative %: 50 %
Platelets: 210 10*3/uL (ref 150–400)
RBC: 3.15 MIL/uL — ABNORMAL LOW (ref 3.87–5.11)
RDW: 17.2 % — ABNORMAL HIGH (ref 11.5–15.5)
Smear Review: NORMAL
WBC: 12 10*3/uL — ABNORMAL HIGH (ref 4.0–10.5)
nRBC: 0.6 % — ABNORMAL HIGH (ref 0.0–0.2)

## 2022-08-16 MED ORDER — PACLITAXEL PROTEIN-BOUND CHEMO INJECTION 100 MG
40.0000 mg/m2 | Freq: Once | INTRAVENOUS | Status: AC
  Administered 2022-08-16: 100 mg via INTRAVENOUS
  Filled 2022-08-16: qty 20

## 2022-08-16 MED ORDER — PROCHLORPERAZINE MALEATE 10 MG PO TABS
10.0000 mg | ORAL_TABLET | Freq: Four times a day (QID) | ORAL | Status: DC | PRN
  Administered 2022-08-16: 10 mg via ORAL
  Filled 2022-08-16: qty 1

## 2022-08-16 MED ORDER — SODIUM CHLORIDE 0.9 % IV SOLN: Freq: Once | INTRAVENOUS | Status: AC

## 2022-08-16 MED ORDER — SODIUM CHLORIDE 0.9 % IV SOLN
750.0000 mg | Freq: Once | INTRAVENOUS | Status: AC
  Administered 2022-08-16: 750 mg via INTRAVENOUS
  Filled 2022-08-16: qty 75

## 2022-08-16 MED ORDER — SODIUM CHLORIDE 0.9% FLUSH
10.0000 mL | INTRAVENOUS | Status: DC | PRN
  Administered 2022-08-16: 10 mL

## 2022-08-16 MED ORDER — PALONOSETRON HCL INJECTION 0.25 MG/5ML
0.2500 mg | Freq: Once | INTRAVENOUS | Status: AC
  Administered 2022-08-16: 0.25 mg via INTRAVENOUS
  Filled 2022-08-16: qty 5

## 2022-08-16 MED ORDER — SODIUM CHLORIDE 0.9% FLUSH
10.0000 mL | Freq: Once | INTRAVENOUS | Status: AC
  Administered 2022-08-16: 10 mL

## 2022-08-16 MED ORDER — SODIUM CHLORIDE 0.9 % IV SOLN
150.0000 mg | Freq: Once | INTRAVENOUS | Status: AC
  Administered 2022-08-16: 150 mg via INTRAVENOUS
  Filled 2022-08-16: qty 150

## 2022-08-16 MED ORDER — SODIUM CHLORIDE 0.9 % IV SOLN
200.0000 mg | Freq: Once | INTRAVENOUS | Status: AC
  Administered 2022-08-16: 200 mg via INTRAVENOUS
  Filled 2022-08-16: qty 200

## 2022-08-16 MED ORDER — HEPARIN SOD (PORK) LOCK FLUSH 100 UNIT/ML IV SOLN
500.0000 [IU] | Freq: Once | INTRAVENOUS | Status: AC | PRN
  Administered 2022-08-16: 500 [IU]

## 2022-08-16 MED ORDER — ALPRAZOLAM 0.5 MG PO TABS: 0.5000 mg | ORAL_TABLET | ORAL | 0 refills | Status: DC | PRN

## 2022-08-16 NOTE — Patient Instructions (Signed)
[facility]   Discharge Instructions: Thank you for choosing [facility] to provide your oncology and hematology care.   If you have a lab appointment with the [facility], please go directly to the [facility] and check in at the registration area.   Wear comfortable clothing and clothing appropriate for easy access to any Portacath or PICC line.   We strive to give you quality time with your provider. You may need to reschedule your appointment if you arrive late (15 or more minutes).  Arriving late affects you and other patients whose appointments are after yours.  Also, if you miss three or more appointments without notifying the office, you may be dismissed from the clinic at the provider's discretion.      For prescription refill requests, have your pharmacy contact our office and allow 72 hours for refills to be completed.    Today you received the following chemotherapy and/or immunotherapy agents: paclitaxel-protein bound (Abraxane), Keytruda, Carboplatin      To help prevent nausea and vomiting after your treatment, we encourage you to take your nausea medication as directed.  BELOW ARE SYMPTOMS THAT SHOULD BE REPORTED IMMEDIATELY: *FEVER GREATER THAN 100.4 F (38 C) OR HIGHER *CHILLS OR SWEATING *NAUSEA AND VOMITING THAT IS NOT CONTROLLED WITH YOUR NAUSEA MEDICATION *UNUSUAL SHORTNESS OF BREATH *UNUSUAL BRUISING OR BLEEDING *URINARY PROBLEMS (pain or burning when urinating, or frequent urination) *BOWEL PROBLEMS (unusual diarrhea, constipation, pain near the anus) TENDERNESS IN MOUTH AND THROAT WITH OR WITHOUT PRESENCE OF ULCERS (sore throat, sores in mouth, or a toothache) UNUSUAL RASH, SWELLING OR PAIN  UNUSUAL VAGINAL DISCHARGE OR ITCHING   Items with * indicate a potential emergency and should be followed up as soon as possible or go to the Emergency Department if any problems should occur.  Please show the CHEMOTHERAPY  ALERT CARD or IMMUNOTHERAPY ALERT CARD at check-in to the Emergency Department and triage nurse.  Should you have questions after your visit or need to cancel or reschedule your appointment, please contact [facility]  Dept: [phone]  and follow the prompts.  Office hours are 8:00 a.m. to 4:30 p.m. Monday - Friday. Please note that voicemails left after 4:00 p.m. may not be returned until the following business day.  We are closed weekends and major holidays. You have access to a nurse at all times for urgent questions. Please call the main number to the clinic Dept: [phone] and follow the prompts.   For any non-urgent questions, you may also contact your provider using MyChart. We now offer e-Visits for anyone 75 and older to request care online for non-urgent symptoms. For details visit [email or web address].   Also download the MyChart app! Go to the app store, search "MyChart", open the app, select [facility], and log in with your MyChart username and password.  Masks are optional in the cancer centers. If you would like for your care team to wear a mask while they are taking care of you, please let them know. You may have one support person who is at least 32 years old accompany you for your appointments.

## 2022-08-16 NOTE — Progress Notes (Signed)
[facility]   [phone] [phone]   Clinic Follow up Note   Patient Care Team: Gabrielle Sailors, PA as PCP - General (Physician Assistant) Gabrielle Germany, RN as Oncology Nurse Navigator Gabrielle Kaufmann, RN as Oncology Nurse Navigator Gabrielle Bookbinder, MD as Consulting Physician (General Surgery) Gabrielle Merle, MD as Consulting Physician (Hematology) Gabrielle Rudd, MD as Consulting Physician (Radiation Oncology) Gabrielle Maudlin, MD as Consulting Physician (Orthopedic Surgery)  Date of Service:  08/16/2022  CHIEF COMPLAINT: f/u of left breast cancer  CURRENT THERAPY:  Adjuvant carboplatin/taxol, weekly, starting 06/14/22             -taxol switched to abraxane from C2D8 [name], starting 06/14/22  ASSESSMENT & PLAN:  Gabrielle [name] is a 31 y.o. pre-menopausal female with   1. 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 Biopsy 03/11/22 showed metaplastic carcinoma. -baseline echo 03/25/22 was normal (EF 60-65%). -staging CT CAP and bone scan 03/26/22 negative for disease outside of the breast. 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 SLN biopsy on 05/14/22 was negative (0/4). -given large tumor size and young age, adjuvant chemo is recommended. Plan for 3 months of weekly carboplatin/taxol, followed by 4 cycles of AC every 3 weeks, with Gabrielle [name]  -she started zoladex on 06/03/22. -she began chemotherapy with carboplatin/taxol and Keytruda on 06/14/22. She has developed whole-body itching and mild numbness since last treatment. We switched her Taxol to abraxane with C2D8. -chemo continues to be hard on her. She has one more cycle of carbo/abraxane, then she will switch to [facility]. Labs reviewed, overall stable.  2. 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  3. Anxiety, Insomnia -she reports a history of anxiety, relieved with marijuana -she has ambien to use as needed -she reports anxiety about her treatments given how hard they've been on her. I provided emotional support and reassurance. I will also call in xanax for her to use as needed.   PLAN: -proceed with C4D1 carbo/abraxane and [place] today, will reduce Abraxane dose further to 86m/m2 due to neuropathy  -I called in xanax -lab, flush, and abraxane in 1 week  -nutrition will see her in infusion -lab, flush, f/u and abraxane in 2 weeks -lab, flush, f/u, and first cycle AC and Keytruda in 3 weeks   No problem-specific Assessment & Plan notes found for this encounter.   SUMMARY OF ONCOLOGIC HISTORY: Oncology History Overview Note   Cancer Staging  Malignant neoplasm of upper-outer quadrant of left breast in female, estrogen receptor negative ([place] Staging form: Breast, AJCC 8th Edition - Clinical stage from 03/11/2022: Stage IIB (cT2, cN0, cM0, G3, ER-, PR-, HER2-) - Signed by FTruitt Merle MD on 03/19/2022    Malignant neoplasm of upper-outer quadrant of left breast in female, estrogen receptor negative ([facility]  03/08/2022 Mammogram   CLINICAL DATA:  31year old female presenting for evaluation of a palpable lump in the left breast which she feels has increased in size since she first identified it. Her mother was diagnosed with breast cancer within the last 6 months. She also has family history of breast cancer in multiple aunts and her maternal grandmother.  EXAM: DIGITAL DIAGNOSTIC BILATERAL MAMMOGRAM WITH TOMOSYNTHESIS AND CAD; ULTRASOUND LEFT BREAST LIMITED  IMPRESSION: 1. There is a suspicious 3.7 cm mass in the left breast at 12 o'clock.    2.  No evidence of left axillary lymphadenopathy.   3.  No evidence of malignancy in the right breast.   03/11/2022 Cancer Staging   Staging form: Breast, AJCC 8th Edition - Clinical stage from 03/11/2022: Stage IIB (cT2, cN0, cM0, G3, ER-, PR-, HER2-) - Signed by Gabrielle Merle, MD on 03/19/2022 Stage prefix: Initial diagnosis Histologic grading system: 3 grade system   03/11/2022 Initial Biopsy   Diagnosis Breast, left, needle core biopsy, 12:00 - METAPLASTIC CARCINOMA - SEE COMMENT  Microscopic Comment The biopsy has an invasive epithelial component admixed with chondroid deposition, consistent with a metaplastic carcinoma. Based on the biopsy, the carcinoma appears Nottingham grade 3 of 3 and measures 1.2 cm in greatest linear extent.  PROGNOSTIC INDICATORS Results: The tumor cells are NEGATIVE for Her2 (0). Estrogen Receptor: 0%, NEGATIVE Progesterone Receptor: 0%, NEGATIVE Proliferation Marker Ki67: 40%   03/15/2022 Initial Diagnosis   Malignant neoplasm of upper-outer quadrant of left breast in female, estrogen receptor negative ([place])   03/28/2022 Genetic Testing   Negative hereditary cancer genetic testing: no pathogenic variants detected in [facility] BRCAPlus Panel or [facility] CustomNext-Cancer +RNAinsight Panel.  Report dates are 03/28/2022 and 03/31/2022. Marland Kitchen   The BRCAplus panel offered by [employer] and includes sequencing and deletion/duplication analysis for the following 8 genes: ATM, BRCA1, BRCA2, CDH1, CHEK2, PALB2, PTEN, and TP53.  The CustomNext-Cancer+RNAinsight panel offered by [facility] includes sequencing and rearrangement analysis for the following 47 genes:  APC, ATM, AXIN2, BARD1, BMPR1A, BRCA1, BRCA2, BRIP1, CDH1, CDK4, CDKN2A, CHEK2, DICER1, EPCAM, GREM1, HOXB13, MEN1, MLH1, MSH2, MSH3, MSH6, MUTYH, NBN, NF1, NF2, NTHL1, PALB2, PMS2, POLD1, POLE, PTEN, RAD51C, RAD51D, RECQL, RET, SDHA, SDHAF2, SDHB, SDHC, SDHD, SMAD4, SMARCA4, STK11, TP53, TSC1, TSC2, and VHL.  RNA  data is routinely analyzed for use in variant interpretation for all genes.   05/14/2022 Cancer Staging   Staging form: Breast, AJCC 8th Edition - Pathologic stage from 05/14/2022: Stage IIA (pT2, pN0, cM0, G3, ER-, PR-, HER2-) - Signed by Gabrielle Merle, MD on 05/28/2022 Stage prefix: Initial diagnosis Histologic grading system: 3 grade system Residual tumor (R): R0 - None   06/13/2022 -  Chemotherapy   Patient is on Treatment Plan : BREAST Pembrolizumab (200) D1 + Carboplatin (1.5) D1,8,15 + Paclitaxel (80) D1,8,15 q21d X 4 cycles / Pembrolizumab (200) D1 + AC D1 q21d x 4 cycles     06/14/2022 - 07/05/2022 Chemotherapy   Patient is on Treatment Plan : BREAST Pembrolizumab (200) D1 + Carboplatin (5) D1 + Paclitaxel (80) D1,8,15 q21d X 4 cycles / Pembrolizumab (200) D1 + AC D1 q21d x 4 cycles        INTERVAL HISTORY:  Gabrielle [name] is here for a follow up of breast cancer. She was last seen by me on 08/02/22. She presents to the clinic alone. She reports her neuropathy is intermittent but painful. She explains the pain occurs about 3 times a week and can be up to 8/10.  She reports anxiety about treatments given how hard it's been on her.   All other systems were reviewed with the patient and are negative.  MEDICAL HISTORY:  Past Medical History:  Diagnosis Date   Arthritis    Bilateral Knees   Cancer ([place]) 03/11/2022   Breast   Family history of breast cancer  03/21/2022   Family history of prostate cancer 03/21/2022   Hypercholesteremia     SURGICAL HISTORY: Past Surgical History:  Procedure Laterality Date   AXILLARY SENTINEL NODE BIOPSY Left 05/14/2022   Procedure: LEFT AXILLARY SENTINEL NODE BIOPSY;  Surgeon: Gabrielle Bookbinder, MD;  Location: [facility];  Service: General;  Laterality: Left;  GEN w/ PEC BLOCK   BREAST BIOPSY Left 03/11/2022   BREAST LUMPECTOMY WITH AXILLARY LYMPH NODE BIOPSY Left 04/16/2022   Procedure: LEFT BREAST LUMPECTOMY WITH LEFT AXILLARY SENTINEL LYMPH NODE  BIOPSY;  Surgeon: Gabrielle Bookbinder, MD;  Location: [facility];  Service: General;  Laterality: Left;   DENTAL SURGERY     PORTACATH PLACEMENT Right 04/16/2022   Procedure: INSERTION PORT-A-CATH;  Surgeon: Gabrielle Bookbinder, MD;  Location: [facility];  Service: General;  Laterality: Right;    I have reviewed the social history and family history with the patient and they are unchanged from previous note.  ALLERGIES:  has No Known Allergies.  MEDICATIONS:  Current Outpatient Medications  Medication Sig Dispense Refill   ALPRAZolam (XANAX) 0.5 MG tablet Take 1 tablet (0.5 mg total) by mouth as needed for anxiety. 30 tablet 0   acetaminophen (TYLENOL) 500 MG tablet Take 1,000 mg by mouth every 6 (six) hours as needed (pain.).     gabapentin (NEURONTIN) 100 MG capsule Take 1-2 capsules (100-200 mg total) by mouth 2 (two) times daily. 60 capsule 0   ibuprofen (ADVIL) 200 MG tablet Take 200 mg by mouth every 8 (eight) hours as needed (pain).     prochlorperazine (COMPAZINE) 10 MG tablet Take 1 tablet (10 mg total) by mouth every 6 (six) hours as needed for nausea or vomiting. 30 tablet 0   promethazine (PHENERGAN) 25 MG tablet Take 1 tablet (25 mg total) by mouth every 6 (six) hours as needed for nausea or vomiting. 30 tablet 0   traMADol (ULTRAM) 50 MG tablet Take 1 tablet (50 mg total) by mouth every 6 (six) hours as needed. 60 tablet 0   zolpidem (AMBIEN CR) 12.5 MG CR tablet Take 1 tablet (12.5 mg total) by mouth at bedtime as needed for sleep. 30 tablet 0   No current facility-administered medications for this visit.   Facility-Administered Medications Ordered in Other Visits  Medication Dose Route Frequency Provider Last Rate Last Admin   CARBOplatin (PARAPLATIN) 750 mg in sodium chloride 0.9 % 250 mL chemo infusion  750 mg Intravenous Once Gabrielle Merle, MD 650 mL/hr at 08/16/22 1301 750 mg at 08/16/22 1301   diphenhydrAMINE (BENADRYL) 50 MG/ML injection             famotidine (PEPCID) 20-0.9 MG/50ML-% IVPB            heparin lock flush 100 unit/mL  500 Units Intracatheter Once PRN Gabrielle Merle, MD       prochlorperazine (COMPAZINE) tablet 10 mg  10 mg Oral Q6H PRN Gabrielle Merle, MD   10 mg at 08/16/22 1009   sodium chloride flush (NS) 0.9 % injection 10 mL  10 mL Intracatheter PRN Gabrielle Merle, MD        PHYSICAL EXAMINATION: ECOG PERFORMANCE STATUS: 2 - Symptomatic, <50% confined to bed  Vitals:   08/16/22 0932  BP: 116/71  Pulse: 83  Resp: 18  Temp: 98 F (36.7 C)  SpO2: 100%   Wt Readings from Last 3 Encounters:  08/16/22 278 lb 9.6 oz (126.4 kg)  08/09/22 268 lb (121.6 kg)  08/02/22 272 lb 3.2  oz (123.5 kg)     GENERAL:alert, no distress and comfortable SKIN: skin color normal, no rashes or significant lesions EYES: normal, Conjunctiva are pink and non-injected, sclera clear  NEURO: alert & oriented x 3 with fluent speech  LABORATORY DATA:  I have reviewed the data as listed    Latest Ref Rng & Units 08/16/2022    9:12 AM 08/09/2022    9:49 AM 08/02/2022    1:01 PM  CBC  WBC 4.0 - 10.5 K/uL 12.0  4.4  5.7   Hemoglobin 12.0 - 15.0 g/dL 8.7  8.5  9.5   Hematocrit 36.0 - 46.0 % 26.3  25.9  28.9   Platelets 150 - 400 K/uL 210  87  415         Latest Ref Rng & Units 08/16/2022    9:12 AM 08/09/2022    9:49 AM 08/02/2022    1:01 PM  CMP  Glucose 70 - 99 mg/dL 117  109  93   BUN 6 - 20 mg/dL _0 Creatinine 0.44 - 1.00 mg/dL 0.97  0.90  0.87   Sodium 135 - 145 mmol/L 139  140  137   Potassium 3.5 - 5.1 mmol/L 3.9  3.3  3.2   Chloride 98 - 111 mmol/L 104  104  102   CO2 22 - 32 mmol/L _1 Calcium 8.9 - 10.3 mg/dL 10.0  9.3  9.9   Total Protein 6.5 - 8.1 g/dL 7.8  6.9  7.7   Total Bilirubin 0.3 - 1.2 mg/dL 0.2  0.2  0.3   Alkaline Phos 38 - 126 U/L 145  120  102   AST 15 - 41 U/L _2 ALT 0 - 44 U/L _3 RADIOGRAPHIC STUDIES: I have personally reviewed the radiological images as listed and  agreed with the findings in the report. No results found.    No orders of the defined types were placed in this encounter.  All questions were answered. The patient knows to call the clinic with any problems, questions or concerns. No barriers to learning was detected. The total time spent in the appointment was 30 minutes.     Gabrielle Merle, MD 08/16/2022   I, Wilburn Mylar, am acting as scribe for Gabrielle Merle, MD.   I have reviewed the above documentation for accuracy and completeness, and I agree with the above.

## 2022-08-18 ENCOUNTER — Encounter: Payer: Self-pay | Admitting: Hematology

## 2022-08-18 ENCOUNTER — Other Ambulatory Visit: Payer: Self-pay

## 2022-08-19 ENCOUNTER — Encounter (HOSPITAL_COMMUNITY): Payer: Self-pay

## 2022-08-19 ENCOUNTER — Emergency Department (HOSPITAL_COMMUNITY)
Admission: EM | Admit: 2022-08-19 | Discharge: 2022-08-19 | Discharge status: Against Medical Advice | Payer: Medicaid Other | Attending: Emergency Medicine | Admitting: Emergency Medicine

## 2022-08-19 DIAGNOSIS — R112 Nausea with vomiting, unspecified: Secondary | ICD-10-CM | POA: Insufficient documentation

## 2022-08-19 DIAGNOSIS — Z853 Personal history of malignant neoplasm of breast: Secondary | ICD-10-CM | POA: Insufficient documentation

## 2022-08-19 DIAGNOSIS — Z5321 Procedure and treatment not carried out due to patient leaving prior to being seen by health care provider: Secondary | ICD-10-CM | POA: Diagnosis not present

## 2022-08-19 MED ORDER — ONDANSETRON 4 MG PO TBDP: 4.0000 mg | ORAL_TABLET | Freq: Once | ORAL | Status: DC | PRN

## 2022-08-19 NOTE — ED Notes (Signed)
Patient stated she wanted to leave. Advised I would go check with the provider so they could take to her. Came back and the patient was not and the room. Informed that she left.

## 2022-08-19 NOTE — ED Triage Notes (Signed)
Pt arrived via POV, c/o nausea, vomiting. Currently being treated for breast cancer.

## 2022-08-23 ENCOUNTER — Inpatient Hospital Stay: Payer: Medicaid Other

## 2022-08-23 ENCOUNTER — Inpatient Hospital Stay: Payer: Medicaid Other | Admitting: Dietician

## 2022-08-29 ENCOUNTER — Other Ambulatory Visit: Payer: Self-pay | Admitting: Hematology

## 2022-08-29 ENCOUNTER — Encounter: Payer: Self-pay | Admitting: Hematology

## 2022-08-29 MED ORDER — GABAPENTIN 100 MG PO CAPS: 100.0000 mg | ORAL_CAPSULE | Freq: Two times a day (BID) | ORAL | 0 refills | Status: DC

## 2022-08-30 ENCOUNTER — Encounter: Payer: Self-pay | Admitting: *Deleted

## 2022-08-30 ENCOUNTER — Inpatient Hospital Stay: Payer: Medicaid Other

## 2022-08-30 ENCOUNTER — Inpatient Hospital Stay: Payer: Medicaid Other | Admitting: Hematology

## 2022-08-31 ENCOUNTER — Inpatient Hospital Stay: Payer: Medicaid Other

## 2022-09-02 ENCOUNTER — Inpatient Hospital Stay: Payer: Medicaid Other

## 2022-09-03 ENCOUNTER — Inpatient Hospital Stay: Payer: Medicaid Other

## 2022-09-05 ENCOUNTER — Encounter: Payer: Self-pay | Admitting: *Deleted

## 2022-09-06 ENCOUNTER — Inpatient Hospital Stay: Payer: Medicaid Other

## 2022-09-06 ENCOUNTER — Other Ambulatory Visit: Payer: Self-pay

## 2022-09-06 ENCOUNTER — Inpatient Hospital Stay: Payer: Medicaid Other | Attending: Hematology | Admitting: Physician Assistant

## 2022-09-06 VITALS — BP 121/80 | HR 100 | Temp 99.1°F | Resp 16 | Wt 271.8 lb

## 2022-09-06 DIAGNOSIS — R3915 Urgency of urination: Secondary | ICD-10-CM | POA: Insufficient documentation

## 2022-09-06 DIAGNOSIS — R5383 Other fatigue: Secondary | ICD-10-CM | POA: Insufficient documentation

## 2022-09-06 DIAGNOSIS — Z171 Estrogen receptor negative status [ER-]: Secondary | ICD-10-CM | POA: Diagnosis not present

## 2022-09-06 DIAGNOSIS — R519 Headache, unspecified: Secondary | ICD-10-CM | POA: Diagnosis not present

## 2022-09-06 DIAGNOSIS — G4452 New daily persistent headache (NDPH): Secondary | ICD-10-CM

## 2022-09-06 DIAGNOSIS — C50412 Malignant neoplasm of upper-outer quadrant of left female breast: Secondary | ICD-10-CM

## 2022-09-06 DIAGNOSIS — Z8042 Family history of malignant neoplasm of prostate: Secondary | ICD-10-CM | POA: Insufficient documentation

## 2022-09-06 DIAGNOSIS — D649 Anemia, unspecified: Secondary | ICD-10-CM | POA: Insufficient documentation

## 2022-09-06 DIAGNOSIS — Z95828 Presence of other vascular implants and grafts: Secondary | ICD-10-CM

## 2022-09-06 DIAGNOSIS — Z803 Family history of malignant neoplasm of breast: Secondary | ICD-10-CM | POA: Insufficient documentation

## 2022-09-06 DIAGNOSIS — F419 Anxiety disorder, unspecified: Secondary | ICD-10-CM | POA: Diagnosis not present

## 2022-09-06 DIAGNOSIS — R112 Nausea with vomiting, unspecified: Secondary | ICD-10-CM | POA: Insufficient documentation

## 2022-09-06 LAB — URINALYSIS, COMPLETE (UACMP) WITH MICROSCOPIC
Bilirubin Urine: NEGATIVE
Glucose, UA: NEGATIVE mg/dL
Ketones, ur: NEGATIVE mg/dL
Nitrite: POSITIVE — AB
Protein, ur: 100 mg/dL — AB
Specific Gravity, Urine: 1.014 (ref 1.005–1.030)
WBC, UA: >50 WBC/hpf — ABNORMAL HIGH (ref 0–5)
pH: 5 (ref 5.0–8.0)

## 2022-09-06 LAB — CMP (CANCER CENTER ONLY)
ALT: 27 U/L (ref 0–44)
AST: 40 U/L (ref 15–41)
Albumin: 4 g/dL (ref 3.5–5.0)
Alkaline Phosphatase: 98 U/L (ref 38–126)
Anion gap: 8 (ref 5–15)
BUN: 7 mg/dL (ref 6–20)
CO2: 29 mmol/L (ref 22–32)
Calcium: 10.3 mg/dL (ref 8.9–10.3)
Chloride: 101 mmol/L (ref 98–111)
Creatinine: 1.01 mg/dL — ABNORMAL HIGH (ref 0.44–1.00)
GFR, Estimated: >60 mL/min (ref >60)
Glucose, Bld: 96 mg/dL (ref 70–99)
Potassium: 3 mmol/L — ABNORMAL LOW (ref 3.5–5.1)
Sodium: 138 mmol/L (ref 135–145)
Total Bilirubin: 0.3 mg/dL (ref 0.3–1.2)
Total Protein: 7.5 g/dL (ref 6.5–8.1)

## 2022-09-06 LAB — VITAMIN B12: Vitamin B-12: 798 pg/mL (ref 180–914)

## 2022-09-06 LAB — CBC WITH DIFFERENTIAL (CANCER CENTER ONLY)
Abs Immature Granulocytes: 0.04 10*3/uL (ref 0.00–0.07)
Basophils Absolute: 0 10*3/uL (ref 0.0–0.1)
Basophils Relative: 0 %
Eosinophils Absolute: 0 10*3/uL (ref 0.0–0.5)
Eosinophils Relative: 0 %
HCT: 25.4 % — ABNORMAL LOW (ref 36.0–46.0)
Hemoglobin: 8.5 g/dL — ABNORMAL LOW (ref 12.0–15.0)
Immature Granulocytes: 1 %
Lymphocytes Relative: 42 %
Lymphs Abs: 2.9 10*3/uL (ref 0.7–4.0)
MCH: 28.7 pg (ref 26.0–34.0)
MCHC: 33.5 g/dL (ref 30.0–36.0)
MCV: 85.8 fL (ref 80.0–100.0)
Monocytes Absolute: 0.9 10*3/uL (ref 0.1–1.0)
Monocytes Relative: 13 %
Neutro Abs: 3 10*3/uL (ref 1.7–7.7)
Neutrophils Relative %: 44 %
Platelet Count: 258 10*3/uL (ref 150–400)
RBC: 2.96 MIL/uL — ABNORMAL LOW (ref 3.87–5.11)
RDW: 22.1 % — ABNORMAL HIGH (ref 11.5–15.5)
WBC Count: 6.8 10*3/uL (ref 4.0–10.5)
nRBC: 0 % (ref 0.0–0.2)

## 2022-09-06 LAB — IRON AND IRON BINDING CAPACITY (CC-WL,HP ONLY)
Iron: 85 ug/dL (ref 28–170)
Saturation Ratios: 26 % (ref 10.4–31.8)
TIBC: 323 ug/dL (ref 250–450)
UIBC: 238 ug/dL (ref 148–442)

## 2022-09-06 LAB — FERRITIN: Ferritin: 328 ng/mL — ABNORMAL HIGH (ref 11–307)

## 2022-09-06 LAB — PREGNANCY, URINE: Preg Test, Ur: NEGATIVE

## 2022-09-06 LAB — FOLATE: Folate: 15.2 ng/mL (ref >5.9)

## 2022-09-06 MED ORDER — SODIUM CHLORIDE 0.9% FLUSH
10.0000 mL | Freq: Once | INTRAVENOUS | Status: AC
  Administered 2022-09-06: 10 mL

## 2022-09-06 MED ORDER — POTASSIUM CHLORIDE CRYS ER 20 MEQ PO TBCR: 20.0000 meq | EXTENDED_RELEASE_TABLET | Freq: Every day | ORAL | 0 refills | Status: DC

## 2022-09-08 ENCOUNTER — Encounter: Payer: Self-pay | Admitting: Hematology

## 2022-09-08 LAB — URINE CULTURE: Culture: >=100000 — AB

## 2022-09-08 MED ORDER — SULFAMETHOXAZOLE-TRIMETHOPRIM 800-160 MG PO TABS: 1.0000 | ORAL_TABLET | Freq: Two times a day (BID) | ORAL | 0 refills | Status: DC

## 2022-09-08 NOTE — Progress Notes (Signed)
[place]   [phone] [phone]   Clinic Follow up Note   Patient Care Team: Trey Sailors, PA as PCP - General (Physician Assistant) Rockwell Germany, RN as Oncology Nurse Navigator Mauro Kaufmann, RN as Oncology Nurse Navigator Rolm Bookbinder, MD as Consulting Physician (General Surgery) Truitt Merle, MD as Consulting Physician (Hematology) Kyung Rudd, MD as Consulting Physician (Radiation Oncology) Latanya Maudlin, MD as Consulting Physician (Orthopedic Surgery) Cordelia Poche as Physician Assistant (Hematology and Oncology)  Date of Service:  09/08/2022  CHIEF COMPLAINT: f/u of left breast cancer  CURRENT THERAPY:  Due to start adjuvant AC plus Keytruda today.   SUMMARY OF ONCOLOGIC HISTORY: Oncology History Overview Note   Cancer Staging  Malignant neoplasm of upper-outer quadrant of left breast in female, estrogen receptor negative ([place]) Staging form: Breast, AJCC 8th Edition - Clinical stage from 03/11/2022: Stage IIB (cT2, cN0, cM0, G3, ER-, PR-, HER2-) - Signed by Truitt Merle, MD on 03/19/2022    Malignant neoplasm of upper-outer quadrant of left breast in female, estrogen receptor negative ([place])  03/08/2022 Mammogram   CLINICAL DATA:  31 year old female presenting for evaluation of a palpable lump in the left breast which she feels has increased in size since she first identified it. Her mother was diagnosed with breast cancer within the last 6 months. She also has family history of breast cancer in multiple aunts and her maternal grandmother.   EXAM: DIGITAL DIAGNOSTIC BILATERAL MAMMOGRAM WITH TOMOSYNTHESIS AND CAD; ULTRASOUND LEFT BREAST LIMITED  IMPRESSION: 1. There is a suspicious 3.7 cm mass in the left breast at 12 o'clock.   2.  No evidence of left axillary lymphadenopathy.   3.  No evidence of malignancy in the right breast.   03/11/2022 Cancer Staging   Staging form: Breast, AJCC 8th Edition -  Clinical stage from 03/11/2022: Stage IIB (cT2, cN0, cM0, G3, ER-, PR-, HER2-) - Signed by Truitt Merle, MD on 03/19/2022 Stage prefix: Initial diagnosis Histologic grading system: 3 grade system   03/11/2022 Initial Biopsy   Diagnosis Breast, left, needle core biopsy, 12:00 - METAPLASTIC CARCINOMA - SEE COMMENT  Microscopic Comment The biopsy has an invasive epithelial component admixed with chondroid deposition, consistent with a metaplastic carcinoma. Based on the biopsy, the carcinoma appears Nottingham grade 3 of 3 and measures 1.2 cm in greatest linear extent.  PROGNOSTIC INDICATORS Results: The tumor cells are NEGATIVE for Her2 (0). Estrogen Receptor: 0%, NEGATIVE Progesterone Receptor: 0%, NEGATIVE Proliferation Marker Ki67: 40%   03/15/2022 Initial Diagnosis   Malignant neoplasm of upper-outer quadrant of left breast in female, estrogen receptor negative ([place])   03/28/2022 Genetic Testing   Negative hereditary cancer genetic testing: no pathogenic variants detected in [facility] BRCAPlus Panel or [facility] CustomNext-Cancer +RNAinsight Panel.  Report dates are 03/28/2022 and 03/31/2022. Marland Kitchen   The BRCAplus panel offered by [employer] and includes sequencing and deletion/duplication analysis for the following 8 genes: ATM, BRCA1, BRCA2, CDH1, CHEK2, PALB2, PTEN, and TP53.  The CustomNext-Cancer+RNAinsight panel offered by [facility] includes sequencing and rearrangement analysis for the following 47 genes:  APC, ATM, AXIN2, BARD1, BMPR1A, BRCA1, BRCA2, BRIP1, CDH1, CDK4, CDKN2A, CHEK2, DICER1, EPCAM, GREM1, HOXB13, MEN1, MLH1, MSH2, MSH3, MSH6, MUTYH, NBN, NF1, NF2, NTHL1, PALB2, PMS2, POLD1, POLE, PTEN, RAD51C, RAD51D, RECQL, RET, SDHA, SDHAF2, SDHB, SDHC, SDHD, SMAD4, SMARCA4, STK11, TP53, TSC1, TSC2, and VHL.  RNA data is routinely analyzed for use in variant interpretation for all genes.   05/14/2022 Cancer Staging  Staging form: Breast, AJCC 8th Edition - Pathologic stage from  05/14/2022: Stage IIA (pT2, pN0, cM0, G3, ER-, PR-, HER2-) - Signed by Truitt Merle, MD on 05/28/2022 Stage prefix: Initial diagnosis Histologic grading system: 3 grade system Residual tumor (R): R0 - None   06/13/2022 -  Chemotherapy   Patient is on Treatment Plan : BREAST Pembrolizumab (200) D1 + Carboplatin (1.5) D1,8,15 + Paclitaxel (80) D1,8,15 q21d X 4 cycles / Pembrolizumab (200) D1 + AC D1 q21d x 4 cycles     06/14/2022 - 07/05/2022 Chemotherapy   Patient is on Treatment Plan : BREAST Pembrolizumab (200) D1 + Carboplatin (5) D1 + Paclitaxel (80) D1,8,15 q21d X 4 cycles / Pembrolizumab (200) D1 + AC D1 q21d x 4 cycles        INTERVAL HISTORY:  Gabrielle [name] is here for a follow up of breast cancer. She was last seen by Dr. Burr Medico on 08/16/22. She presents to the clinic alone.  Ms. Hirota reports that she is having progressive fatigue since her last chemotherapy infusion. She adds that over the last week, she has progressive headache and right eye pain with blurry vision. She has persistent nausea and vomiting. She has tried all the prescribed medications including zofran, compazine and phenergan with minimal relief. She is using marijuana which has improved her nausea. She adds having worsening urinary urgency and is concerned about having a UTI. She denies fevers, chills, sweats, shortness of breath, chest pain or cough. She has no other complaints.     All other systems were reviewed with the patient and are negative.  MEDICAL HISTORY:  Past Medical History:  Diagnosis Date   Arthritis    Bilateral Knees   Cancer ([place]) 03/11/2022   Breast   Family history of breast cancer 03/21/2022   Family history of prostate cancer 03/21/2022   Hypercholesteremia     SURGICAL HISTORY: Past Surgical History:  Procedure Laterality Date   AXILLARY SENTINEL NODE BIOPSY Left 05/14/2022   Procedure: LEFT AXILLARY SENTINEL NODE BIOPSY;  Surgeon: Rolm Bookbinder, MD;  Location: [facility];  Service:  General;  Laterality: Left;  GEN w/ PEC BLOCK   BREAST BIOPSY Left 03/11/2022   BREAST LUMPECTOMY WITH AXILLARY LYMPH NODE BIOPSY Left 04/16/2022   Procedure: LEFT BREAST LUMPECTOMY WITH LEFT AXILLARY SENTINEL LYMPH NODE BIOPSY;  Surgeon: Rolm Bookbinder, MD;  Location: [facility];  Service: General;  Laterality: Left;   DENTAL SURGERY     PORTACATH PLACEMENT Right 04/16/2022   Procedure: INSERTION PORT-A-CATH;  Surgeon: Rolm Bookbinder, MD;  Location: [facility];  Service: General;  Laterality: Right;    I have reviewed the social history and family history with the patient and they are unchanged from previous note.  ALLERGIES:  has No Known Allergies.  MEDICATIONS:  Current Outpatient Medications  Medication Sig Dispense Refill   acetaminophen (TYLENOL) 500 MG tablet Take 1,000 mg by mouth every 6 (six) hours as needed (pain.).     ALPRAZolam (XANAX) 0.5 MG tablet Take 1 tablet (0.5 mg total) by mouth as needed for anxiety. 30 tablet 0   aspirin-acetaminophen-caffeine (EXCEDRIN MIGRAINE) 250-250-65 MG tablet Take by mouth every 6 (six) hours as needed for headache.     gabapentin (NEURONTIN) 100 MG capsule Take 1-2 capsules (100-200 mg total) by mouth 2 (two) times daily. 60 capsule 0   ibuprofen (ADVIL) 200 MG tablet Take 200 mg by mouth every 8 (eight) hours as needed (pain).     methocarbamol (  ROBAXIN) 500 MG tablet Take by mouth as needed.     ondansetron (ZOFRAN) 4 MG tablet Take 4 mg by mouth every 8 (eight) hours as needed for nausea or vomiting.     potassium chloride SA (KLOR-CON M) 20 MEQ tablet Take 1 tablet (20 mEq total) by mouth daily. 14 tablet 0   prochlorperazine (COMPAZINE) 10 MG tablet Take 1 tablet (10 mg total) by mouth every 6 (six) hours as needed for nausea or vomiting. 30 tablet 0   promethazine (PHENERGAN) 25 MG tablet Take 1 tablet (25 mg total) by mouth every 6 (six) hours as needed for nausea or vomiting. 30 tablet 0    traMADol (ULTRAM) 50 MG tablet Take 1 tablet (50 mg total) by mouth every 6 (six) hours as needed. 60 tablet 0   zolpidem (AMBIEN CR) 12.5 MG CR tablet Take 1 tablet (12.5 mg total) by mouth at bedtime as needed for sleep. 30 tablet 0   No current facility-administered medications for this visit.   Facility-Administered Medications Ordered in Other Visits  Medication Dose Route Frequency Provider Last Rate Last Admin   diphenhydrAMINE (BENADRYL) 50 MG/ML injection            famotidine (PEPCID) 20-0.9 MG/50ML-% IVPB             PHYSICAL EXAMINATION: ECOG PERFORMANCE STATUS: 2 - Symptomatic, <50% confined to bed  Vitals:   09/06/22 1029  BP: 121/80  Pulse: 100  Resp: 16  Temp: 99.1 F (37.3 C)  SpO2: 100%   Wt Readings from Last 3 Encounters:  09/06/22 271 lb 12.8 oz (123.3 kg)  08/16/22 278 lb 9.6 oz (126.4 kg)  08/09/22 268 lb (121.6 kg)     GENERAL:alert, no distress and comfortable SKIN: skin color normal, no rashes or significant lesions EYES: normal, Conjunctiva are pink and non-injected, sclera clear  NEURO: alert & oriented x 3 with fluent speech, no neuro deficits.   LABORATORY DATA:  I have reviewed the data as listed    Latest Ref Rng & Units 09/06/2022    9:55 AM 08/16/2022    9:12 AM 08/09/2022    9:49 AM  CBC  WBC 4.0 - 10.5 K/uL 6.8  12.0  4.4   Hemoglobin 12.0 - 15.0 g/dL 8.5  8.7  8.5   Hematocrit 36.0 - 46.0 % 25.4  26.3  25.9   Platelets 150 - 400 K/uL 258  210  87         Latest Ref Rng & Units 09/06/2022    9:55 AM 08/16/2022    9:12 AM 08/09/2022    9:49 AM  CMP  Glucose 70 - 99 mg/dL 96  117  109   BUN 6 - 20 mg/dL '7  13  12   ' Creatinine 0.44 - 1.00 mg/dL 1.01  0.97  0.90   Sodium 135 - 145 mmol/L 138  139  140   Potassium 3.5 - 5.1 mmol/L 3.0  3.9  3.3   Chloride 98 - 111 mmol/L 101  104  104   CO2 22 - 32 mmol/L '29  30  31   ' Calcium 8.9 - 10.3 mg/dL 10.3  10.0  9.3   Total Protein 6.5 - 8.1 g/dL 7.5  7.8  6.9   Total Bilirubin 0.3 -  1.2 mg/dL 0.3  0.2  0.2   Alkaline Phos 38 - 126 U/L 98  145  120   AST 15 - 41 U/L 40  18  24  ALT 0 - 44 U/L '27  22  22       ' RADIOGRAPHIC STUDIES: I have personally reviewed the radiological images as listed and agreed with the findings in the report. No results found.    ASSESSMENT & PLAN:  ANALLELI GIERKE is a 31 y.o. pre-menopausal female with   1. 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 Biopsy 03/11/22 showed metaplastic carcinoma. -baseline echo 03/25/22 was normal (EF 60-65%). -staging CT CAP and bone scan 03/26/22 negative for disease outside of the breast. 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 SLN biopsy on 05/14/22 was negative (0/4). -given large tumor size and young age, adjuvant chemo is recommended. Plan for 3 months of weekly carboplatin/taxol, followed by 4 cycles of AC every 3 weeks, with Beryle Flock  -she started zoladex on 06/03/22. -she began chemotherapy with carboplatin/taxol and Keytruda on 06/14/22. She has developed whole-body itching and mild numbness since last treatment. We switched her Taxol to abraxane with C2D8. -chemo continues to be hard on her. She has one more cycle of carbo/abraxane, then she will switch to [facility]. She finished Cycle 4, Day of carbo/abraxane plus [place] on 08/16/2022. -She is due to start [facility] plus Keytruda today.   2. 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   3. Anxiety, Insomnia -she reports a history of anxiety, relieved with marijuana -she has ambien to use as needed -she reports anxiety about her  treatments given how hard they've been on her. I provided emotional support and reassurance. I will also call in xanax for her to use as needed.  4. Normocytic anemia:  --Persistent anemia with Hgb 8.5, MCV 85.8, Likely chemotherapy induced.  --check iron, B12 and folate levels today without evidence of deficiency.   5. Urinary urgency: --UA and culture today confirm UTI --Sent Bactrim x 7 days  6. Headaches/R eye pain/blurry vision/nausea and vomiting: --Has various antiemetics including zofran, compazine and phenergan with minimal relief. Patient is currently using marijuana with some improvement. Discussed risks versus benefit of using marijuana with chemotherapy --Need to rule out brain mets as underlying cause so ordered MRI brain.   PLAN: --Labs today reviewed. Persistent anemia with Hgb 8.5. No other cytopenias. Creatinine 1.01, normal LFTs.  --Potassium is 3.0 today. Sent prescription of potassium chloride 20 mEq daily. --Due to worsening fatigue and noted above symptoms, patient would like to defer treatment by one week. --Ordered MRI brain due to new onset headaches/right eye pain with persistent nausea/vomiting. --Sent 7 day course of bactrim due to UA/culture that confirmed UTI.  --RTC in one week for labs, follow up visit with Dr. Burr Medico to determine if she can restart chemotherapy.    Orders Placed This Encounter  Procedures   Urine Culture    Standing Status:   Future    Number of Occurrences:   1    Standing Expiration Date:   09/06/2023   Ferritin    Standing Status:   Future    Number of Occurrences:   1    Standing Expiration Date:   09/07/2023   Iron and Iron Binding Capacity ([facility] only)  Standing Status:   Future    Number of Occurrences:   1    Standing Expiration Date:   09/07/2023   Vitamin B12    Standing Status:   Future    Number of Occurrences:   1    Standing Expiration Date:   09/06/2023   Methylmalonic acid, serum    Standing Status:    Future    Number of Occurrences:   1    Standing Expiration Date:   09/06/2023   Folate, Serum    Standing Status:   Future    Number of Occurrences:   1    Standing Expiration Date:   09/06/2023   Urinalysis, Complete w Microscopic    Standing Status:   Future    Number of Occurrences:   1    Standing Expiration Date:   09/07/2023   All questions were answered. The patient knows to call the clinic with any problems, questions or concerns. No barriers to learning was detected.   I have spent a total of 30 minutes minutes of face-to-face and non-face-to-face time, preparing to see the patient, performing a medically appropriate examination, counseling and educating the patient, ordering medications/tests, documenting clinical information in the electronic health record, and care coordination.   Dede Query PA-C Dept of Hematology and [place] at [facility] Phone: [phone]

## 2022-09-09 ENCOUNTER — Inpatient Hospital Stay: Payer: Medicaid Other

## 2022-09-09 ENCOUNTER — Ambulatory Visit (HOSPITAL_COMMUNITY)
Admission: RE | Admit: 2022-09-09 | Discharge: 2022-09-09 | Disposition: A | Discharge status: Home / Self Care | Payer: Medicaid Other | Source: Ambulatory Visit | Attending: Physician Assistant | Admitting: Physician Assistant

## 2022-09-09 ENCOUNTER — Telehealth: Payer: Self-pay

## 2022-09-09 ENCOUNTER — Telehealth: Payer: Self-pay | Admitting: *Deleted

## 2022-09-09 DIAGNOSIS — G4452 New daily persistent headache (NDPH): Secondary | ICD-10-CM | POA: Insufficient documentation

## 2022-09-09 LAB — METHYLMALONIC ACID, SERUM: Methylmalonic Acid, Quantitative: 130 nmol/L (ref 0–378)

## 2022-09-09 MED ORDER — GADOBUTROL 1 MMOL/ML IV SOLN
10.0000 mL | Freq: Once | INTRAVENOUS | Status: AC | PRN
  Administered 2022-09-09: 10 mL via INTRAVENOUS

## 2022-09-09 NOTE — Telephone Encounter (Signed)
Spoke with patient and she was glad the MRI was negative however, does report the headaches are intense.  Reports pain from the back of her head radiating around to her eyes.   She would like a referral to neurology to further evaluate.

## 2022-09-09 NOTE — Telephone Encounter (Signed)
-----   Message from Lincoln Brigham, PA-C sent at 09/09/2022  1:54 PM EDT ----- Please notify that MRI brain is negative. If she has persistent headaches, can make referral to neurology

## 2022-09-09 NOTE — Telephone Encounter (Signed)
please schedule stat MRI brain  -  09/09/22  at 12:00 at [facility]  please notify her of potassium and bactrim prescription I sent. she has positive UTI  and  K was low   Pt advised of MRI appt, lab results and new prescriptions.

## 2022-09-10 ENCOUNTER — Other Ambulatory Visit: Payer: Self-pay | Admitting: Physician Assistant

## 2022-09-10 ENCOUNTER — Telehealth: Payer: Self-pay | Admitting: Hematology

## 2022-09-10 DIAGNOSIS — R519 Headache, unspecified: Secondary | ICD-10-CM

## 2022-09-10 NOTE — Telephone Encounter (Signed)
Per 10/20 los called and left message for pt about appointments

## 2022-09-11 ENCOUNTER — Encounter: Payer: Self-pay | Admitting: *Deleted

## 2022-09-11 ENCOUNTER — Other Ambulatory Visit: Payer: Self-pay

## 2022-09-11 NOTE — Progress Notes (Deleted)
[facility] OFFICE PROGRESS NOTE  Trey Sailors, [place] [address] [place] [address]  DIAGNOSIS:  f/u of left breast cancer  Oncology History Overview Note   Cancer Staging  Malignant neoplasm of upper-outer quadrant of left breast in female, estrogen receptor negative ([place]) Staging form: Breast, AJCC 8th Edition - Clinical stage from 03/11/2022: Stage IIB (cT2, cN0, cM0, G3, ER-, PR-, HER2-) - Signed by Truitt Merle, MD on 03/19/2022    Malignant neoplasm of upper-outer quadrant of left breast in female, estrogen receptor negative ([place])  03/08/2022 Mammogram   CLINICAL DATA:  31 year old female presenting for evaluation of a palpable lump in the left breast which she feels has increased in size since she first identified it. Her mother was diagnosed with breast cancer within the last 6 months. She also has family history of breast cancer in multiple aunts and her maternal grandmother.   EXAM: DIGITAL DIAGNOSTIC BILATERAL MAMMOGRAM WITH TOMOSYNTHESIS AND CAD; ULTRASOUND LEFT BREAST LIMITED  IMPRESSION: 1. There is a suspicious 3.7 cm mass in the left breast at 12 o'clock.   2.  No evidence of left axillary lymphadenopathy.   3.  No evidence of malignancy in the right breast.   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 Based on the biopsy, the carcinoma appears Nottingham grade 3 of 3 and measures 1.2 cm in greatest linear extent.  PROGNOSTIC INDICATORS Results: The tumor  cells are NEGATIVE for Her2 (0). Estrogen Receptor: 0%, NEGATIVE Progesterone Receptor: 0%, NEGATIVE Proliferation Marker Ki67: 40%   03/15/2022 Initial Diagnosis   Malignant neoplasm of upper-outer quadrant of left breast in female, estrogen receptor negative ([place])   03/28/2022 Genetic Testing   Negative hereditary cancer genetic testing: no pathogenic variants detected in [facility] BRCAPlus Panel or [facility] CustomNext-Cancer +RNAinsight Panel.  Report dates are 03/28/2022 and 03/31/2022. Marland Kitchen   The BRCAplus panel offered by [employer] and includes sequencing and deletion/duplication analysis for the following 8 genes: ATM, BRCA1, BRCA2, CDH1, CHEK2, PALB2, PTEN, and TP53.  The CustomNext-Cancer+RNAinsight panel offered by [facility] includes sequencing and rearrangement analysis for the following 47 genes:  APC, ATM, AXIN2, BARD1, BMPR1A, BRCA1, BRCA2, BRIP1, CDH1, CDK4, CDKN2A, CHEK2, DICER1, EPCAM, GREM1, HOXB13, MEN1, MLH1, MSH2, MSH3, MSH6, MUTYH, NBN, NF1, NF2, NTHL1, PALB2, PMS2, POLD1, POLE, PTEN, RAD51C, RAD51D, RECQL, RET, SDHA, SDHAF2, SDHB, SDHC, SDHD, SMAD4, SMARCA4, STK11, TP53, TSC1, TSC2, and VHL.  RNA data is routinely analyzed for use in variant interpretation for all genes.   05/14/2022 Cancer Staging   Staging form: Breast, AJCC 8th Edition - Pathologic stage from 05/14/2022: Stage IIA (pT2, pN0, cM0, G3, ER-, PR-, HER2-) - Signed by Truitt Merle, MD on 05/28/2022 Stage prefix: Initial diagnosis Histologic grading system: 3 grade system Residual tumor (R): R0 - None   06/13/2022 -  Chemotherapy   Patient is on Treatment Plan : BREAST Pembrolizumab (200) D1 + Carboplatin (1.5) D1,8,15 + Paclitaxel (80) D1,8,15 q21d X 4 cycles / Pembrolizumab (200) D1 + AC D1 q21d x 4 cycles     06/14/2022 -  07/05/2022 Chemotherapy   Patient is on Treatment Plan : BREAST Pembrolizumab (200) D1 + Carboplatin (5) D1 + Paclitaxel (80) D1,8,15 q21d X 4 cycles / Pembrolizumab (200) D1 + AC D1 q21d x 4 cycles         CURRENT THERAPY: Due to start adjuvant AC plus Keytruda today  INTERVAL HISTORY: Gabrielle [name] 31 y.o. female returns to clinic today for follow-up visit accompanied by ***the patient was last seen by my colleague Murray Hodgkins last week on 10/20/thousand 23.  The patient was endorsing having a challenging time with treatment at that time with worsening fatigue, headaches with associated visual changes, persistent nausea, and worsening urinary frequency and UTI.  She was scheduled to start her first cycle of adjuvant AC plus Keytruda but it was deferred by 1 week to allow more time to recover and to discuss continuing treatment with Dr. Burr Medico.  At her last appointment, for her headache she had a brain MRI which was fortunately negative for any metastatic disease to the brain.  The patient was referred to a neurologist for her headaches which she is expected to see on ***.  Nausea and vomiting?  For the urinary tract infection, she completed her antibiotics with Bactrim and her symptoms have***.  She also was endorsing fatigue, anxiety, and depression for which she was prescribed Xanax.  Ask if interested in seeing counselor or spiritual support?  Takes Ambien for insomnia.  Otherwise the patient denies any fever, chills, night sweats, or unexplained weight loss.  She denies any chest pain, shortness of breath, cough, or hemoptysis.  New bone pain?  Diarrhea or constipation?  She denies any rashes or skin changes.  She is here today for reevaluation and for consideration of starting AC plus Keytruda.      MEDICAL HISTORY: Past Medical History:  Diagnosis Date   Arthritis    Bilateral Knees   Cancer ([place]) 03/11/2022   Breast   Family history of breast cancer 03/21/2022   Family history of prostate cancer 03/21/2022   Hypercholesteremia     ALLERGIES:  has No Known Allergies.  MEDICATIONS:  Current Outpatient Medications  Medication Sig Dispense Refill   acetaminophen (TYLENOL) 500 MG  tablet Take 1,000 mg by mouth every 6 (six) hours as needed (pain.).     ALPRAZolam (XANAX) 0.5 MG tablet Take 1 tablet (0.5 mg total) by mouth as needed for anxiety. 30 tablet 0   aspirin-acetaminophen-caffeine (EXCEDRIN MIGRAINE) 250-250-65 MG tablet Take by mouth every 6 (six) hours as needed for headache.     gabapentin (NEURONTIN) 100 MG capsule Take 1-2 capsules (100-200 mg total) by mouth 2 (two) times daily. 60 capsule 0   ibuprofen (ADVIL) 200 MG tablet Take 200 mg by mouth every 8 (eight) hours as needed (pain).     methocarbamol (ROBAXIN) 500 MG tablet Take by mouth as needed.     ondansetron (ZOFRAN) 4 MG tablet Take 4 mg by mouth every 8 (eight) hours as needed for nausea or vomiting.     potassium chloride SA (KLOR-CON M) 20 MEQ tablet Take 1 tablet (20 mEq total) by mouth daily. 14 tablet 0   prochlorperazine (COMPAZINE) 10 MG tablet Take 1 tablet (10 mg total) by mouth every 6 (six) hours as needed for nausea or vomiting. 30 tablet 0   promethazine (PHENERGAN) 25 MG tablet Take 1 tablet (25 mg total) by mouth every 6 (six) hours as needed for nausea or vomiting. 30 tablet 0   sulfamethoxazole-trimethoprim (BACTRIM DS)  800-160 MG tablet Take 1 tablet by mouth 2 (two) times daily. 14 tablet 0   traMADol (ULTRAM) 50 MG tablet Take 1 tablet (50 mg total) by mouth every 6 (six) hours as needed. 60 tablet 0   zolpidem (AMBIEN CR) 12.5 MG CR tablet Take 1 tablet (12.5 mg total) by mouth at bedtime as needed for sleep. 30 tablet 0   No current facility-administered medications for this visit.   Facility-Administered Medications Ordered in Other Visits  Medication Dose Route Frequency Provider Last Rate Last Admin   diphenhydrAMINE (BENADRYL) 50 MG/ML injection            famotidine (PEPCID) 20-0.9 MG/50ML-% IVPB             SURGICAL HISTORY:  Past Surgical History:  Procedure Laterality Date   AXILLARY SENTINEL NODE BIOPSY Left 05/14/2022   Procedure: LEFT AXILLARY SENTINEL NODE  BIOPSY;  Surgeon: Rolm Bookbinder, MD;  Location: [facility];  Service: General;  Laterality: Left;  GEN w/ PEC BLOCK   BREAST BIOPSY Left 03/11/2022   BREAST LUMPECTOMY WITH AXILLARY LYMPH NODE BIOPSY Left 04/16/2022   Procedure: LEFT BREAST LUMPECTOMY WITH LEFT AXILLARY SENTINEL LYMPH NODE BIOPSY;  Surgeon: Rolm Bookbinder, MD;  Location: [facility];  Service: General;  Laterality: Left;   DENTAL SURGERY     PORTACATH PLACEMENT Right 04/16/2022   Procedure: INSERTION PORT-A-CATH;  Surgeon: Rolm Bookbinder, MD;  Location: [facility];  Service: General;  Laterality: Right;    REVIEW OF SYSTEMS:   Review of Systems  Constitutional: Negative for appetite change, chills, fatigue, fever and unexpected weight change.  HENT:   Negative for mouth sores, nosebleeds, sore throat and trouble swallowing.   Eyes: Negative for eye problems and icterus.  Respiratory: Negative for cough, hemoptysis, shortness of breath and wheezing.   Cardiovascular: Negative for chest pain and leg swelling.  Gastrointestinal: Negative for abdominal pain, constipation, diarrhea, nausea and vomiting.  Genitourinary: Negative for bladder incontinence, difficulty urinating, dysuria, frequency and hematuria.   Musculoskeletal: Negative for back pain, gait problem, neck pain and neck stiffness.  Skin: Negative for itching and rash.  Neurological: Negative for dizziness, extremity weakness, gait problem, headaches, light-headedness and seizures.  Hematological: Negative for adenopathy. Does not bruise/bleed easily.  Psychiatric/Behavioral: Negative for confusion, depression and sleep disturbance. The patient is not nervous/anxious.     PHYSICAL EXAMINATION:  There were no vitals taken for this visit.  ECOG PERFORMANCE STATUS: {CHL ONC ECOG [id number]  Physical Exam  Constitutional: Oriented to person, place, and time and well-developed, well-nourished, and in no distress. No distress.   HENT:  Head: Normocephalic and atraumatic.  Mouth/Throat: Oropharynx is clear and moist. No oropharyngeal exudate.  Eyes: Conjunctivae are normal. Right eye exhibits no discharge. Left eye exhibits no discharge. No scleral icterus.  Neck: Normal range of motion. Neck supple.  Cardiovascular: Normal rate, regular rhythm, normal heart sounds and intact distal pulses.   Pulmonary/Chest: Effort normal and breath sounds normal. No respiratory distress. No wheezes. No rales.  Abdominal: Soft. Bowel sounds are normal. Exhibits no distension and no mass. There is no tenderness.  Musculoskeletal: Normal range of motion. Exhibits no edema.  Lymphadenopathy:    No cervical adenopathy.  Neurological: Alert and oriented to person, place, and time. Exhibits normal muscle tone. Gait normal. Coordination normal.  Skin: Skin is warm and dry. No rash noted. Not diaphoretic. No erythema. No pallor.  Psychiatric: Mood, memory and judgment normal.  Vitals reviewed.  LABORATORY DATA:  Lab Results  Component Value Date   WBC 6.8 09/06/2022   HGB 8.5 (L) 09/06/2022   HCT 25.4 (L) 09/06/2022   MCV 85.8 09/06/2022   PLT 258 09/06/2022      Chemistry      Component Value Date/Time   NA 138 09/06/2022 0955   K 3.0 (L) 09/06/2022 0955   CL 101 09/06/2022 0955   CO2 29 09/06/2022 0955   BUN 7 09/06/2022 0955   CREATININE 1.01 (H) 09/06/2022 0955      Component Value Date/Time   CALCIUM 10.3 09/06/2022 0955   ALKPHOS 98 09/06/2022 0955   AST 40 09/06/2022 0955   ALT 27 09/06/2022 0955   BILITOT 0.3 09/06/2022 0955       RADIOGRAPHIC STUDIES:  MR Brain W Wo Contrast  Result Date: 09/09/2022 CLINICAL DATA:  Street breast cancer right eye pain with persistent nausea and vomiting EXAM: MRI HEAD WITHOUT AND WITH CONTRAST TECHNIQUE: Multiplanar, multiecho pulse sequences of the brain and surrounding structures were obtained without and with intravenous contrast. CONTRAST:  10 cc Gadavist COMPARISON:   CT head 09/20/2015, report only FINDINGS: Brain: There is no acute intracranial hemorrhage, extra-axial fluid collection, or acute infarct. Parenchymal volume is normal. The ventricles are normal in size. Gray-white differentiation is preserved. The pituitary and other midline structures are normal. Parenchymal signal is normal. There is no abnormal enhancement. There is no suspicious parenchymal signal abnormality. There is no mass lesion. There is no mass effect or midline shift. Vascular: Normal flow voids. Skull and upper cervical spine: Normal marrow signal. Sinuses/Orbits: The paranasal sinuses are clear. The globes and orbits are unremarkable. The optic nerves are grossly unremarkable on these nondedicated sequences. Other: None. IMPRESSION: Normal brain MRI. Electronically Signed   By: Valetta Mole M.D.   On: 09/09/2022 13:05     ASSESSMENT/PLAN:  Gabrielle [name] is a 31 y.o. pre-menopausal female with    1. 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 Biopsy 03/11/22 showed metaplastic carcinoma. -baseline echo 03/25/22 was normal (EF 60-65%). -staging CT CAP and bone scan 03/26/22 negative for disease outside of the breast. 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 SLN biopsy on 05/14/22 was negative (0/4). -given large tumor size and young age, adjuvant chemo is recommended. Plan for 3 months of weekly carboplatin/taxol, followed by 4 cycles of AC every 3 weeks, with Beryle Flock  -she started zoladex on 06/03/22. -she began chemotherapy with carboplatin/taxol and Keytruda on 06/14/22. She has developed whole-body itching and mild numbness since last treatment. We switched her Taxol to abraxane with C2D8. -chemo continues to be hard on her. She finished Cycle 4, Day of carbo/abraxane plus  [place] on 08/16/2022. -She is due to start [facility] plus Keytruda today. It was delayed by 1 week while she recovered -Reviewed her symptoms with Dr. Burr Medico who recommended *** -Recommend ***   2. 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    3. Anxiety, Insomnia -she reports a history of anxiety, relieved with marijuana -she has ambien to use as needed -she reports anxiety about her treatments given how hard they've been on her.  xanax ws rxed at last appointment -Refer to counselor***???***   4. Normocytic anemia:  --Persistent anemia with Hgb ***, MCV 85.8, Likely chemotherapy induced.  --Last week, checked iron, B12 and folate levels without evidence of deficiency.    5. Urinary urgency: --UA and culture today confirm UTI on 10/20 --Sent Bactrim x 7 days -***Symptoms *** at this time   6. Headaches/R eye pain/blurry vision/nausea and vomiting: --Last week on 10/20 c/o. Nausea without relief from various antiemetics including zofran, compazine and phenergan with minimal relief. Patient is currently using marijuana with some improvement. Previously discussed risks versus benefit of using marijuana with chemotherapy --MRI brain negative for metastatic disease to the brain -She was referred to a neurologist. She has an appointment on ***.     PLAN: --Labs today reviewed. Persistent anemia with Hgb ***. No other cytopenias. Creatinine ***, normal LFTs.  --Potassium *** -Review dose Dr. Burr Medico *** -Nutritionist seeing today      No orders of the defined types were placed in this encounter.    I spent {CHL ONC TIME VISIT - [id number]} counseling the patient face to face. The total time spent in the appointment was {CHL ONC TIME VISIT - GBMSX:[id number]}.  Mervil Wacker  L Sofhia Ulibarri, PA-C 09/11/22

## 2022-09-12 ENCOUNTER — Other Ambulatory Visit: Payer: Self-pay

## 2022-09-12 MED FILL — Fosaprepitant Dimeglumine For IV Infusion 150 MG (Base Eq): INTRAVENOUS | Qty: 5 | Status: AC

## 2022-09-13 ENCOUNTER — Inpatient Hospital Stay: Payer: Medicaid Other

## 2022-09-13 ENCOUNTER — Encounter: Payer: Self-pay | Admitting: Physician Assistant

## 2022-09-13 ENCOUNTER — Inpatient Hospital Stay: Payer: Medicaid Other | Admitting: Physician Assistant

## 2022-09-13 ENCOUNTER — Other Ambulatory Visit: Payer: Self-pay | Admitting: Physician Assistant

## 2022-09-13 ENCOUNTER — Telehealth: Payer: Self-pay | Admitting: Physician Assistant

## 2022-09-13 ENCOUNTER — Inpatient Hospital Stay: Payer: Medicaid Other | Admitting: Dietician

## 2022-09-13 NOTE — Telephone Encounter (Signed)
The patient had an appointment at 10 AM today. She did not show up for her appointment. I called and left a voicemail to please call us back to rescheduled. 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 I will also message my scheduler to try to reschedule her appointment.

## 2022-09-14 ENCOUNTER — Other Ambulatory Visit: Payer: Self-pay

## 2022-09-17 ENCOUNTER — Encounter: Payer: Self-pay | Admitting: Neurology

## 2022-09-18 ENCOUNTER — Other Ambulatory Visit: Payer: Self-pay

## 2022-09-18 ENCOUNTER — Encounter: Payer: Self-pay | Admitting: *Deleted

## 2022-09-18 MED FILL — Fosaprepitant Dimeglumine For IV Infusion 150 MG (Base Eq): INTRAVENOUS | Qty: 5 | Status: AC

## 2022-09-18 NOTE — Progress Notes (Signed)
[facility] OFFICE PROGRESS NOTE  Gabrielle [name], [place] [address] [place] [address]  DIAGNOSIS: f/u of left breast cancer  Oncology History Overview Note   Cancer Staging  Malignant neoplasm of upper-outer quadrant of left breast in female, estrogen receptor negative ([place]) Staging form: Breast, AJCC 8th Edition - Clinical stage from 03/11/2022: Stage IIB (cT2, cN0, cM0, G3, ER-, PR-, HER2-) - Signed by Truitt Merle, MD on 03/19/2022    Malignant neoplasm of upper-outer quadrant of left breast in female, estrogen receptor negative ([place])  03/08/2022 Mammogram   CLINICAL DATA:  31 year old female presenting for evaluation of a palpable lump in the left breast which she feels has increased in size since she first identified it. Her mother was diagnosed with breast cancer within the last 6 months. She also has family history of breast cancer in multiple aunts and her maternal grandmother.   EXAM: DIGITAL DIAGNOSTIC BILATERAL MAMMOGRAM WITH TOMOSYNTHESIS AND CAD; ULTRASOUND LEFT BREAST LIMITED  IMPRESSION: 1. There is a suspicious 3.7 cm mass in the left breast at 12 o'clock.   2.  No evidence of left axillary lymphadenopathy.   3.  No evidence of malignancy in the right breast.   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 Based on the biopsy, the carcinoma appears Nottingham grade 3 of 3 and measures 1.2 cm in greatest linear extent.  PROGNOSTIC INDICATORS Results: The tumor  cells are NEGATIVE for Her2 (0). Estrogen Receptor: 0%, NEGATIVE Progesterone Receptor: 0%, NEGATIVE Proliferation Marker Ki67: 40%   03/15/2022 Initial Diagnosis   Malignant neoplasm of upper-outer quadrant of left breast in female, estrogen receptor negative ([place])   03/28/2022 Genetic Testing   Negative hereditary cancer genetic testing: no pathogenic variants detected in [facility] BRCAPlus Panel or [facility] CustomNext-Cancer +RNAinsight Panel.  Report dates are 03/28/2022 and 03/31/2022. Marland Kitchen   The BRCAplus panel offered by [employer] and includes sequencing and deletion/duplication analysis for the following 8 genes: ATM, BRCA1, BRCA2, CDH1, CHEK2, PALB2, PTEN, and TP53.  The CustomNext-Cancer+RNAinsight panel offered by [facility] includes sequencing and rearrangement analysis for the following 47 genes:  APC, ATM, AXIN2, BARD1, BMPR1A, BRCA1, BRCA2, BRIP1, CDH1, CDK4, CDKN2A, CHEK2, DICER1, EPCAM, GREM1, HOXB13, MEN1, MLH1, MSH2, MSH3, MSH6, MUTYH, NBN, NF1, NF2, NTHL1, PALB2, PMS2, POLD1, POLE, PTEN, RAD51C, RAD51D, RECQL, RET, SDHA, SDHAF2, SDHB, SDHC, SDHD, SMAD4, SMARCA4, STK11, TP53, TSC1, TSC2, and VHL.  RNA data is routinely analyzed for use in variant interpretation for all genes.   05/14/2022 Cancer Staging   Staging form: Breast, AJCC 8th Edition - Pathologic stage from 05/14/2022: Stage IIA (pT2, pN0, cM0, G3, ER-, PR-, HER2-) - Signed by Truitt Merle, MD on 05/28/2022 Stage prefix: Initial diagnosis Histologic grading system: 3 grade system Residual tumor (R): R0 - None   06/13/2022 -  Chemotherapy   Patient is on Treatment Plan : BREAST Pembrolizumab (200) D1 + Carboplatin (1.5) D1,8,15 + Paclitaxel (80) D1,8,15 q21d X 4 cycles / Pembrolizumab (200) D1 + AC D1 q21d x 4 cycles     06/14/2022 -  07/05/2022 Chemotherapy   Patient is on Treatment Plan : BREAST Pembrolizumab (200) D1 + Carboplatin (5) D1 + Paclitaxel (80) D1,8,15 q21d X 4 cycles / Pembrolizumab (200) D1 + AC D1 q21d x 4 cycles         CURRENT THERAPY: Due to start adjuvant AC plus Keytruda today  INTERVAL HISTORY: Gabrielle [name] 31 y.o. female returns to the clinic today for a follow up visit. The patient was last seen on 10/20 by my colleague, Murray Hodgkins. At that time, she was supposed to start [facility] plus [place]. She had completed her prior treatment with carboplatin, abraxane, and [place].  However, she was endorsing poor tolerance to chemotherapy. At the last appointment, she was reporting progressive headache with associated right eye pain and blurry vision. She had a stat MRI which was negative for metastatic disease to the brain. She did request referral to neurology. However, her appointment is not until March 2024. She states tylenol does not help but Excedrin does. Denies any sinus concerns or facial pressure.   She also was endorsing significant nausea despite having zofran, compazine, and phenergan. However, upon further questioning, it does not sound like she she has been taking this majority of the time. She does not like taking pills. She took phenergan the last two days which seemed to help. Without her anti-emetic, she estimates she vomits 4-5x per day. She reports she tries to eat but "can't keep it down" in addition to aversion to certain smells or thought of certain foods.   She also was treated for UTI when last seen. Her urinary symptoms have improved; however, she now reports some skin peeling in her groin. Reports mild itchiness.     She has anxiety. Xanax was refilled at her last appointment. She also reportedly smokes marijuana and they previously had a risks/benefits discussion with her that was documented. She states that marijuana is the only thing that helps her appetite and nausea.   Her potassium was also low last week.  The patient states she has a hard time swallowing large pills and has not completed this prescription.  Due to her symptoms, she opted to delay treatment by 1 week. She was  supposed to follow up on 10/27 but missed her appointment.   Since last being seen, the extra 4 weeks since last undergoing treatment, she states she has been sick "the whole time". In addition to the nausea/vomiting and poor appetite (lost 10 lbs since 08/16/22), she reports the treatment makes her joints feel stiff. She would be She states this is stiff all over her body, including shoulders and knees. Denies history of autoimmune condition. 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  Rates her pain a 9 out of 10.   She denies any fever, chills, or night sweats.  Denies any chest pain, shortness of breath, cough, or hemoptysis.  He denies any lower extremity swelling.  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  She is taking 100 mg of gabapentin twice daily.  She does not take this 3 times daily because it made her too drowsy.  She particularly feels the neuropathy at nighttime.  Its not constant.   After having a long discussion about if she is interested in treatment or not, the patient states that she knows she needs to "get it done".  She understands given her triple negative status and grade 3 that she is  at high risk for recurrence.       MEDICAL HISTORY: Past Medical History:  Diagnosis Date   Arthritis    Bilateral Knees   Cancer ([place]) 03/11/2022   Breast   Family history of breast cancer 03/21/2022   Family history of prostate cancer 03/21/2022   Hypercholesteremia     ALLERGIES:  has No Known Allergies.  MEDICATIONS:  Current Outpatient Medications  Medication Sig Dispense Refill   acetaminophen (TYLENOL) 500 MG tablet Take 1,000 mg by mouth every 6 (six) hours as needed (pain.).     ALPRAZolam (XANAX) 0.5 MG tablet Take 1 tablet (0.5 mg total) by mouth as needed for anxiety. 30 tablet 0   aspirin-acetaminophen-caffeine (EXCEDRIN MIGRAINE) 250-250-65 MG  tablet Take by mouth every 6 (six) hours as needed for headache.     gabapentin (NEURONTIN) 100 MG capsule Take 1-2 capsules (100-200 mg total) by mouth 2 (two) times daily. 60 capsule 0   ibuprofen (ADVIL) 200 MG tablet Take 200 mg by mouth every 8 (eight) hours as needed (pain).     methocarbamol (ROBAXIN) 500 MG tablet Take by mouth as needed.     mirtazapine (REMERON) 15 MG tablet Take 1 tablet (15 mg total) by mouth at bedtime. 30 tablet 2   nystatin (MYCOSTATIN/NYSTOP) powder Apply 1 Application topically 3 (three) times daily. 30 g 0   ondansetron (ZOFRAN) 4 MG tablet Take 4 mg by mouth every 8 (eight) hours as needed for nausea or vomiting.     potassium chloride SA (KLOR-CON M) 20 MEQ tablet Take 1 tablet (20 mEq total) by mouth daily. 14 tablet 0   prochlorperazine (COMPAZINE) 10 MG tablet Take 1 tablet (10 mg total) by mouth every 6 (six) hours as needed for nausea or vomiting. 30 tablet 0   promethazine (PHENERGAN) 25 MG tablet Take 1 tablet (25 mg total) by mouth every 6 (six) hours as needed for nausea or vomiting. 30 tablet 0   sulfamethoxazole-trimethoprim (BACTRIM DS) 800-160 MG tablet Take 1 tablet by mouth 2 (two) times daily. 14 tablet 0   zolpidem (AMBIEN CR) 12.5 MG CR tablet Take 1 tablet (12.5 mg total) by mouth at bedtime as needed for sleep. 30 tablet 0   No current facility-administered medications for this visit.   Facility-Administered Medications Ordered in Other Visits  Medication Dose Route Frequency Provider Last Rate Last Admin   diphenhydrAMINE (BENADRYL) 50 MG/ML injection            famotidine (PEPCID) 20-0.9 MG/50ML-% IVPB            sodium chloride flush (NS) 0.9 % injection 10 mL  10 mL Intracatheter PRN Truitt Merle, MD   10 mL at 09/19/22 1439    SURGICAL HISTORY:  Past Surgical History:  Procedure Laterality Date   AXILLARY SENTINEL NODE BIOPSY Left 05/14/2022   Procedure: LEFT AXILLARY SENTINEL NODE BIOPSY;  Surgeon: Rolm Bookbinder, MD;  Location:  [facility];  Service: General;  Laterality: Left;  GEN w/ PEC BLOCK   BREAST BIOPSY Left 03/11/2022   BREAST LUMPECTOMY WITH AXILLARY LYMPH NODE BIOPSY Left 04/16/2022   Procedure: LEFT BREAST LUMPECTOMY WITH LEFT AXILLARY SENTINEL LYMPH NODE BIOPSY;  Surgeon: Rolm Bookbinder, MD;  Location: [facility];  Service: General;  Laterality: Left;   DENTAL SURGERY     PORTACATH PLACEMENT Right 04/16/2022   Procedure: INSERTION PORT-A-CATH;  Surgeon: Rolm Bookbinder, MD;  Location: [facility];  Service: General;  Laterality: Right;  REVIEW OF SYSTEMS:   Review of Systems  Constitutional: Positive for fatigue, weight loss, and poor appetite.  Negative for chills and fever.  HENT: Negative for mouth sores, nosebleeds, sore throat and trouble swallowing.   Eyes: Negative for eye problems and icterus.  Respiratory: Negative for cough, hemoptysis, shortness of breath and wheezing.   Cardiovascular: Negative for chest pain and leg swelling.  Gastrointestinal: Positive for nausea and vomiting.  Negative for abdominal pain, constipation, diarrhea, nausea and vomiting.  Genitourinary: Positive for skin peeling in the intertriginous areas.  Negative for bladder incontinence, difficulty urinating, dysuria, frequency and hematuria.   Musculoskeletal: Positive for diffuse joint pain.  Negative for back pain, gait problem, neck pain and neck stiffness.  Skin: Negative for itching and rash.  Neurological: Positive for headaches.  Negative for dizziness, extremity weakness, gait problem, light-headedness and seizures.  Hematological: Negative for adenopathy. Does not bruise/bleed easily.  Psychiatric/Behavioral: Negative for confusion, depression and sleep disturbance. The patient is not nervous/anxious.     PHYSICAL EXAMINATION:  Blood pressure (!) 97/57, pulse 98, temperature 98.2 F (36.8 C), temperature source Oral, resp. rate 18, weight 268 lb 9.6 oz (121.8 kg), SpO2 100  %.  ECOG PERFORMANCE STATUS: 1  Physical Exam  Constitutional: Oriented to person, place, and time and well-developed, well-nourished, and in no distress. No distress.  HENT:  Head: Normocephalic and atraumatic.  Mouth/Throat: Oropharynx is clear and moist. No oropharyngeal exudate.  Eyes: Conjunctivae are normal. Right eye exhibits no discharge. Left eye exhibits no discharge. No scleral icterus.  Neck: Normal range of motion. Neck supple.  Cardiovascular: Normal rate, regular rhythm, normal heart sounds and intact distal pulses.   Pulmonary/Chest: Effort normal and breath sounds normal. No respiratory distress. No wheezes. No rales.  Abdominal: Soft. Bowel sounds are normal. Exhibits no distension and no mass. There is no tenderness.  Musculoskeletal: Normal range of motion. Exhibits no edema.  Lymphadenopathy:    No cervical adenopathy.  Neurological: Alert and oriented to person, place, and time. Exhibits normal muscle tone. Gait normal. Coordination normal.  Skin: Skin is warm and dry. No rash noted. Not diaphoretic. No erythema. No pallor.  Psychiatric: Mood, memory and judgment normal.  Vitals reviewed.  LABORATORY DATA: Lab Results  Component Value Date   WBC 6.9 09/19/2022   HGB 9.5 (L) 09/19/2022   HCT 28.8 (L) 09/19/2022   MCV 89.2 09/19/2022   PLT 323 09/19/2022      Chemistry      Component Value Date/Time   NA 137 09/19/2022 1019   K 3.1 (L) 09/19/2022 1019   CL 103 09/19/2022 1019   CO2 28 09/19/2022 1019   BUN 9 09/19/2022 1019   CREATININE 1.04 (H) 09/19/2022 1019   CREATININE 1.01 (H) 09/06/2022 0955      Component Value Date/Time   CALCIUM 10.6 (H) 09/19/2022 1019   ALKPHOS 87 09/19/2022 1019   AST 39 09/19/2022 1019   AST 40 09/06/2022 0955   ALT 22 09/19/2022 1019   ALT 27 09/06/2022 0955   BILITOT 0.5 09/19/2022 1019   BILITOT 0.3 09/06/2022 0955       RADIOGRAPHIC STUDIES:  MR Brain W Wo Contrast  Result Date: 09/09/2022 CLINICAL  DATA:  Street breast cancer right eye pain with persistent nausea and vomiting EXAM: MRI HEAD WITHOUT AND WITH CONTRAST TECHNIQUE: Multiplanar, multiecho pulse sequences of the brain and surrounding structures were obtained without and with intravenous contrast. CONTRAST:  10 cc Gadavist COMPARISON:  CT head 09/20/2015,  report only FINDINGS: Brain: There is no acute intracranial hemorrhage, extra-axial fluid collection, or acute infarct. Parenchymal volume is normal. The ventricles are normal in size. Gray-white differentiation is preserved. The pituitary and other midline structures are normal. Parenchymal signal is normal. There is no abnormal enhancement. There is no suspicious parenchymal signal abnormality. There is no mass lesion. There is no mass effect or midline shift. Vascular: Normal flow voids. Skull and upper cervical spine: Normal marrow signal. Sinuses/Orbits: The paranasal sinuses are clear. The globes and orbits are unremarkable. The optic nerves are grossly unremarkable on these nondedicated sequences. Other: None. IMPRESSION: Normal brain MRI. Electronically Signed   By: Valetta Mole M.D.   On: 09/09/2022 13:05     ASSESSMENT/PLAN:  ARIYANNA OIEN is a 31 y.o. pre-menopausal female with    1. 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 Biopsy 03/11/22 showed metaplastic carcinoma. -baseline echo 03/25/22 was normal (EF 60-65%). -staging CT CAP and bone scan 03/26/22 negative for disease outside of the breast. 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 SLN biopsy on 05/14/22 was negative (0/4). -given large tumor size and young age, adjuvant chemo is recommended. Plan for 3 months of weekly carboplatin/taxol, followed by 4 cycles of AC every 3 weeks, with Beryle Flock  -she started  zoladex on 06/03/22. -she began chemotherapy with carboplatin/taxol and Keytruda on 06/14/22. She has developed whole-body itching and mild numbness. Dr. Burr Medico switched her Taxol to abraxane with C2D8. -chemo continues to be hard on her. She completed carbo/abraxane, then she will switch to [facility] today.  -She is due to start [facility] plus Keytruda today.  -Patient was seen with Dr. Burr Medico today had a lengthy discussion today with the patient about her current condition.  The patient states that she understands that she should do chemotherapy to reduce her risk of recurrence and wants to resume.  If she tolerates poorly, then she may want to revisit stopping chemotherapy early.  The patient was agreeable to proceeding with her first cycle of AC plus Keytruda today.  Dr. Burr Medico is going to reduce her dose by 10 to 15%. -We will arrange for a lab recheck in 1 week toxicity check. -We discussed patient education for symptom control with antiemetics, joint pain, and peripheral neuropathy.   2. 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  -Today, does not take gabapentin consistently. States it makes her drowsy. Mostly had PN at night, advised she can use gabapentin 100-300 mg p.o. before bedtime.    3. Anxiety, Insomnia -she reports a history of anxiety, relieved with marijuana -she has ambien to use as needed -she reports anxiety about her treatments given how hard they've been on her. Dr. Burr Medico prescribed xanax -Given prescription of Remeron 15 mg p.o. nightly.  Confirm she is not taking tramadol due to drug drug interactions with Remeron.   4. Normocytic anemia:  --Persistent but slightly improved anemia with Hgb 9.5, MCV 89.2, Likely chemotherapy induced.  --check iron, B12 and folate levels were  recently checked without evidence of deficiency.  -Will arrange for weekly sample to blood banks in case she requires supportive transfusions in  the future    5. Urinary urgency/groin skin peeling: --UA and culture today confirm UTI at last appointment --Sent Bactrim x 7 days, completed. Resolved urinary symptoms at this time, ever, she developed some skin darkening and peeling in the intertriginous area in the groin.  -On exam shows mild skin darkening and skin peeling.  May be mild fungal infection.  Given prescription for nystatin.  Also given handout on AVS for intertrigo and lifestyle modifications.    6. Headaches/R eye pain/blurry vision/nausea and vomiting: --Has various antiemetics including zofran, compazine and phenergan with minimal relief. Patient is currently using marijuana with some improvement. Discussed risks versus benefit of using marijuana with chemotherapy --Need to rule out brain mets as underlying cause so ordered MRI brain. This was negative for metastatic disease.  -Furl was placed to neurology.  Unable to get appointment till March 2024.  Encouraged the patient call to see if she can be put on a wait list. -Vies to use Tylenol for her headache.  She states she has been taking Excedrin which has been more effective for her.  7. Nausea/Vomiting/Hypokalemia/Hypotensive/loss -The patient's last chemotherapy was on 9/29; ever, she is reported persistent nausea and vomiting 5 times per day since that time. -Nausea and vomiting secondary to aversions to smell or thought of certain food items -Prescription for Compazine, Zofran, and Phenergan.  Does not sound like she is taking these regularly -Patient education given on antiemetics.  Discussed many patients have better control nausea with alternating their antiemetics.  Recommend she take 1 antiemetic first thing in the morning/1 hour to 30 minutes prior to trying to eat breakfast to see if that helps hold food down. -Potassium  slightly low at 3.1.  She does not tolerate large pills.  She still has several tablets of her 20 mill equivalents of potassium chloride from her last appointment.  Discussed she may crush this and take it in applesauce if that is easier for her.  Vies to take this once a day for 1 week.  We will recheck her labs next week. -Hypokalemia likely secondary to nausea vomiting/poor p.o. intake. -Mild hypotension today.  May be secondary to poor p.o. intake and nausea vomiting?  The patient states she is trying to hydrate well at home.  Given her slightly elevated calcium and hypotension will receive half a liter fluid off for 1 hour today.  8. 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  Recommend use Tylenol. -Dr. Burr Medico stated if no improvement in her joint pain after completion of immunotherapy, then they may consider referral to rheumatology    PLAN: --Labs today reviewed. Improved but persistent anemia with Hgb 9.5. No other cytopenias. Added sample to blood bank weekly.  -Creatinine 1.04, normal LFTs. Mild hypercalcemia. She does not take calcium or multivitamin. 1/2 L of fluid today --Potassium is 3.1 today. Advised to take 1 tablet KCL for 7 days. Will recheck next week. Discussed she may crush and put pill in apple sauce if unable to swallow the pill whole -The patient was seen with Dr. Burr Medico. The patient was agreeable to proceed with treatment. Dr. Burr Medico will reduce chemo 10-15%.  -Will arrange weekly labs and 1 week toxicity check.  -Remeron sent to pharmacy for appetite. Confirmed she is not taking tramadol  due to DDI with remeron.  -Advised to use tylenol for joint pain. May be related to IO therapy. If persistent joint pain even off IO, can  consider rheumatology referral in the future.  -Reviewed alternating anti-emetics for better control of her nausea -If gabapentin makes her tired and if she feels neuropathy at night, advised to take 100-300 mg of gabapentin at night and avoid daytime dosing.  -Advised to call neurology to see if she can be put on a wait list to be seen sooner.       Orders Placed This Encounter  Procedures   Sample to Blood Bank    Standing Status:   Standing    Number of Occurrences:   5    Standing Expiration Date:   09/20/2023      Arren Laminack L Gabrielle Kazanjian, PA-C 09/19/22  Addendum I have seen the patient, examined her. I agree with the assessment and and plan and have edited the notes.   Gabrielle [name] has recovered some after a month chemo break, lab reviewed, she is agreeable to continue chemo. Will proceed first cycle AC today with dose reduction and continue Keytruda. F/u nxt week for toxicity check.   Truitt Merle MD 09/19/2022

## 2022-09-19 ENCOUNTER — Inpatient Hospital Stay: Payer: Medicaid Other | Attending: Hematology

## 2022-09-19 ENCOUNTER — Inpatient Hospital Stay (HOSPITAL_BASED_OUTPATIENT_CLINIC_OR_DEPARTMENT_OTHER): Payer: Medicaid Other | Admitting: Physician Assistant

## 2022-09-19 ENCOUNTER — Other Ambulatory Visit: Payer: Self-pay

## 2022-09-19 ENCOUNTER — Inpatient Hospital Stay: Payer: Medicaid Other

## 2022-09-19 ENCOUNTER — Other Ambulatory Visit: Payer: Self-pay | Admitting: Physician Assistant

## 2022-09-19 VITALS — BP 97/57 | HR 98 | Temp 98.2°F | Resp 18 | Wt 268.6 lb

## 2022-09-19 DIAGNOSIS — R112 Nausea with vomiting, unspecified: Secondary | ICD-10-CM | POA: Insufficient documentation

## 2022-09-19 DIAGNOSIS — E876 Hypokalemia: Secondary | ICD-10-CM | POA: Insufficient documentation

## 2022-09-19 DIAGNOSIS — Z803 Family history of malignant neoplasm of breast: Secondary | ICD-10-CM | POA: Insufficient documentation

## 2022-09-19 DIAGNOSIS — G4700 Insomnia, unspecified: Secondary | ICD-10-CM | POA: Insufficient documentation

## 2022-09-19 DIAGNOSIS — G629 Polyneuropathy, unspecified: Secondary | ICD-10-CM | POA: Insufficient documentation

## 2022-09-19 DIAGNOSIS — L304 Erythema intertrigo: Secondary | ICD-10-CM | POA: Diagnosis not present

## 2022-09-19 DIAGNOSIS — R1115 Cyclical vomiting syndrome unrelated to migraine: Secondary | ICD-10-CM | POA: Diagnosis not present

## 2022-09-19 DIAGNOSIS — Z95828 Presence of other vascular implants and grafts: Secondary | ICD-10-CM

## 2022-09-19 DIAGNOSIS — Z87440 Personal history of urinary (tract) infections: Secondary | ICD-10-CM | POA: Insufficient documentation

## 2022-09-19 DIAGNOSIS — I959 Hypotension, unspecified: Secondary | ICD-10-CM | POA: Diagnosis not present

## 2022-09-19 DIAGNOSIS — C50412 Malignant neoplasm of upper-outer quadrant of left female breast: Secondary | ICD-10-CM | POA: Diagnosis not present

## 2022-09-19 DIAGNOSIS — Z79899 Other long term (current) drug therapy: Secondary | ICD-10-CM | POA: Insufficient documentation

## 2022-09-19 DIAGNOSIS — D649 Anemia, unspecified: Secondary | ICD-10-CM

## 2022-09-19 DIAGNOSIS — E7800 Pure hypercholesterolemia, unspecified: Secondary | ICD-10-CM | POA: Insufficient documentation

## 2022-09-19 DIAGNOSIS — Z5112 Encounter for antineoplastic immunotherapy: Secondary | ICD-10-CM | POA: Insufficient documentation

## 2022-09-19 DIAGNOSIS — Z5111 Encounter for antineoplastic chemotherapy: Secondary | ICD-10-CM | POA: Insufficient documentation

## 2022-09-19 DIAGNOSIS — Z171 Estrogen receptor negative status [ER-]: Secondary | ICD-10-CM | POA: Diagnosis not present

## 2022-09-19 DIAGNOSIS — R630 Anorexia: Secondary | ICD-10-CM | POA: Diagnosis not present

## 2022-09-19 DIAGNOSIS — N390 Urinary tract infection, site not specified: Secondary | ICD-10-CM | POA: Insufficient documentation

## 2022-09-19 DIAGNOSIS — Z8042 Family history of malignant neoplasm of prostate: Secondary | ICD-10-CM | POA: Diagnosis not present

## 2022-09-19 DIAGNOSIS — G6289 Other specified polyneuropathies: Secondary | ICD-10-CM

## 2022-09-19 DIAGNOSIS — M2550 Pain in unspecified joint: Secondary | ICD-10-CM

## 2022-09-19 LAB — CBC WITH DIFFERENTIAL/PLATELET
Abs Immature Granulocytes: 0.03 10*3/uL (ref 0.00–0.07)
Basophils Absolute: 0 10*3/uL (ref 0.0–0.1)
Basophils Relative: 0 %
Eosinophils Absolute: 0.3 10*3/uL (ref 0.0–0.5)
Eosinophils Relative: 4 %
HCT: 28.8 % — ABNORMAL LOW (ref 36.0–46.0)
Hemoglobin: 9.5 g/dL — ABNORMAL LOW (ref 12.0–15.0)
Immature Granulocytes: 0 %
Lymphocytes Relative: 46 %
Lymphs Abs: 3.1 10*3/uL (ref 0.7–4.0)
MCH: 29.4 pg (ref 26.0–34.0)
MCHC: 33 g/dL (ref 30.0–36.0)
MCV: 89.2 fL (ref 80.0–100.0)
Monocytes Absolute: 0.7 10*3/uL (ref 0.1–1.0)
Monocytes Relative: 10 %
Neutro Abs: 2.7 10*3/uL (ref 1.7–7.7)
Neutrophils Relative %: 40 %
Platelets: 323 10*3/uL (ref 150–400)
RBC: 3.23 MIL/uL — ABNORMAL LOW (ref 3.87–5.11)
RDW: 21.6 % — ABNORMAL HIGH (ref 11.5–15.5)
WBC: 6.9 10*3/uL (ref 4.0–10.5)
nRBC: 0 % (ref 0.0–0.2)

## 2022-09-19 LAB — COMPREHENSIVE METABOLIC PANEL
ALT: 22 U/L (ref 0–44)
AST: 39 U/L (ref 15–41)
Albumin: 4.1 g/dL (ref 3.5–5.0)
Alkaline Phosphatase: 87 U/L (ref 38–126)
Anion gap: 6 (ref 5–15)
BUN: 9 mg/dL (ref 6–20)
CO2: 28 mmol/L (ref 22–32)
Calcium: 10.6 mg/dL — ABNORMAL HIGH (ref 8.9–10.3)
Chloride: 103 mmol/L (ref 98–111)
Creatinine, Ser: 1.04 mg/dL — ABNORMAL HIGH (ref 0.44–1.00)
GFR, Estimated: >60 mL/min (ref >60)
Glucose, Bld: 112 mg/dL — ABNORMAL HIGH (ref 70–99)
Potassium: 3.1 mmol/L — ABNORMAL LOW (ref 3.5–5.1)
Sodium: 137 mmol/L (ref 135–145)
Total Bilirubin: 0.5 mg/dL (ref 0.3–1.2)
Total Protein: 7.6 g/dL (ref 6.5–8.1)

## 2022-09-19 LAB — PREGNANCY, URINE: Preg Test, Ur: NEGATIVE

## 2022-09-19 MED ORDER — SODIUM CHLORIDE 0.9% FLUSH
10.0000 mL | Freq: Once | INTRAVENOUS | Status: AC
  Administered 2022-09-19: 10 mL

## 2022-09-19 MED ORDER — NYSTATIN 100000 UNIT/GM EX POWD: 1.0000 | Freq: Three times a day (TID) | CUTANEOUS | 0 refills | Status: DC

## 2022-09-19 MED ORDER — MIRTAZAPINE 15 MG PO TABS: 15.0000 mg | ORAL_TABLET | Freq: Every day | ORAL | 2 refills | Status: DC

## 2022-09-19 MED ORDER — DOXORUBICIN HCL CHEMO IV INJECTION 2 MG/ML
50.0000 mg/m2 | Freq: Once | INTRAVENOUS | Status: AC
  Administered 2022-09-19: 124 mg via INTRAVENOUS
  Filled 2022-09-19: qty 62

## 2022-09-19 MED ORDER — SODIUM CHLORIDE 0.9 % IV SOLN
200.0000 mg | Freq: Once | INTRAVENOUS | Status: AC
  Administered 2022-09-19: 200 mg via INTRAVENOUS
  Filled 2022-09-19: qty 200

## 2022-09-19 MED ORDER — PALONOSETRON HCL INJECTION 0.25 MG/5ML
0.2500 mg | Freq: Once | INTRAVENOUS | Status: AC
  Administered 2022-09-19: 0.25 mg via INTRAVENOUS
  Filled 2022-09-19: qty 5

## 2022-09-19 MED ORDER — HEPARIN SOD (PORK) LOCK FLUSH 100 UNIT/ML IV SOLN
500.0000 [IU] | Freq: Once | INTRAVENOUS | Status: AC | PRN
  Administered 2022-09-19: 500 [IU]

## 2022-09-19 MED ORDER — SODIUM CHLORIDE 0.9 % IV SOLN
150.0000 mg | Freq: Once | INTRAVENOUS | Status: AC
  Administered 2022-09-19: 150 mg via INTRAVENOUS
  Filled 2022-09-19 (×2): qty 5
  Filled 2022-09-19: qty 150

## 2022-09-19 MED ORDER — SODIUM CHLORIDE 0.9 % IV SOLN: Freq: Once | INTRAVENOUS | Status: AC

## 2022-09-19 MED ORDER — SODIUM CHLORIDE 0.9 % IV SOLN
500.0000 mg/m2 | Freq: Once | INTRAVENOUS | Status: AC
  Administered 2022-09-19: 1240 mg via INTRAVENOUS
  Filled 2022-09-19: qty 62

## 2022-09-19 MED ORDER — SODIUM CHLORIDE 0.9% FLUSH
10.0000 mL | INTRAVENOUS | Status: DC | PRN
  Administered 2022-09-19: 10 mL

## 2022-09-19 NOTE — Patient Instructions (Signed)
[name] ONCOLOGY  Discharge Instructions: Thank you for choosing [facility] to provide your oncology and hematology care.   If you have a lab appointment with the [facility], please go directly to the [facility] and check in at the registration area.   Wear comfortable clothing and clothing appropriate for easy access to any Portacath or PICC line.   We strive to give you quality time with your provider. You may need to reschedule your appointment if you arrive late (15 or more minutes).  Arriving late affects you and other patients whose appointments are after yours.  Also, if you miss three or more appointments without notifying the office, you may be dismissed from the clinic at the provider's discretion.      For prescription refill requests, have your pharmacy contact our office and allow 72 hours for refills to be completed.    Today you received the following chemotherapy and/or immunotherapy agents: Keytruda, Adriamycin, Cytoxan      To help prevent nausea and vomiting after your treatment, we encourage you to take your nausea medication as directed.  BELOW ARE SYMPTOMS THAT SHOULD BE REPORTED IMMEDIATELY: *FEVER GREATER THAN 100.4 F (38 C) OR HIGHER *CHILLS OR SWEATING *NAUSEA AND VOMITING THAT IS NOT CONTROLLED WITH YOUR NAUSEA MEDICATION *UNUSUAL SHORTNESS OF BREATH *UNUSUAL BRUISING OR BLEEDING *URINARY PROBLEMS (pain or burning when urinating, or frequent urination) *BOWEL PROBLEMS (unusual diarrhea, constipation, pain near the anus) TENDERNESS IN MOUTH AND THROAT WITH OR WITHOUT PRESENCE OF ULCERS (sore throat, sores in mouth, or a toothache) UNUSUAL RASH, SWELLING OR PAIN  UNUSUAL VAGINAL DISCHARGE OR ITCHING   Items with * indicate a potential emergency and should be followed up as soon as possible or go to the Emergency Department if any problems should occur.  Please show the CHEMOTHERAPY ALERT CARD or IMMUNOTHERAPY ALERT  CARD at check-in to the Emergency Department and triage nurse.  Should you have questions after your visit or need to cancel or reschedule your appointment, please contact [facility]  Dept: [phone]  and follow the prompts.  Office hours are 8:00 a.m. to 4:30 p.m. Monday - Friday. Please note that voicemails left after 4:00 p.m. may not be returned until the following business day.  We are closed weekends and major holidays. You have access to a nurse at all times for urgent questions. Please call the main number to the clinic Dept: [phone] and follow the prompts.   For any non-urgent questions, you may also contact your provider using MyChart. We now offer e-Visits for anyone 59 and older to request care online for non-urgent symptoms. For details visit [email or web address].   Also download the MyChart app! Go to the app store, search "MyChart", open the app, select , and log in with your MyChart username and password.  Masks are optional in the cancer centers. If you would like for your care team to wear a mask while they are taking care of you, please let them know. You may have one support person who is at least 32 years old accompany you for your appointments.

## 2022-09-19 NOTE — Progress Notes (Signed)
Per Dr. Burr Medico ok to use port with positioning in high SVC.

## 2022-09-20 ENCOUNTER — Other Ambulatory Visit: Payer: Self-pay | Admitting: Physician Assistant

## 2022-09-20 ENCOUNTER — Telehealth: Payer: Self-pay | Admitting: Hematology

## 2022-09-20 DIAGNOSIS — C50412 Malignant neoplasm of upper-outer quadrant of left female breast: Secondary | ICD-10-CM

## 2022-09-20 NOTE — Telephone Encounter (Signed)
Scheduled per 11/02 los, patient has been called and voicemail was left regarding ALL upcoming appointments.

## 2022-09-21 ENCOUNTER — Inpatient Hospital Stay: Payer: Medicaid Other

## 2022-09-23 ENCOUNTER — Other Ambulatory Visit: Payer: Self-pay

## 2022-09-23 ENCOUNTER — Inpatient Hospital Stay: Payer: Medicaid Other

## 2022-09-23 VITALS — BP 110/57 | HR 109 | Resp 18

## 2022-09-23 DIAGNOSIS — Z171 Estrogen receptor negative status [ER-]: Secondary | ICD-10-CM

## 2022-09-23 MED ORDER — PEGFILGRASTIM-CBQV 6 MG/0.6ML ~~LOC~~ SOSY
6.0000 mg | PREFILLED_SYRINGE | Freq: Once | SUBCUTANEOUS | Status: DC
  Filled 2022-09-23: qty 0.6

## 2022-09-26 ENCOUNTER — Other Ambulatory Visit: Payer: Medicaid Other

## 2022-09-26 ENCOUNTER — Encounter: Payer: Self-pay | Admitting: *Deleted

## 2022-09-27 ENCOUNTER — Other Ambulatory Visit: Payer: Self-pay

## 2022-09-27 ENCOUNTER — Encounter: Payer: Self-pay | Admitting: Hematology

## 2022-09-27 ENCOUNTER — Inpatient Hospital Stay (HOSPITAL_BASED_OUTPATIENT_CLINIC_OR_DEPARTMENT_OTHER): Payer: Medicaid Other | Admitting: Hematology

## 2022-09-27 ENCOUNTER — Inpatient Hospital Stay: Payer: Medicaid Other

## 2022-09-27 ENCOUNTER — Other Ambulatory Visit: Payer: Medicaid Other

## 2022-09-27 VITALS — BP 97/68 | HR 126 | Temp 99.4°F | Resp 16 | Ht 70.0 in | Wt 254.7 lb

## 2022-09-27 DIAGNOSIS — Z95828 Presence of other vascular implants and grafts: Secondary | ICD-10-CM

## 2022-09-27 DIAGNOSIS — C50412 Malignant neoplasm of upper-outer quadrant of left female breast: Secondary | ICD-10-CM | POA: Diagnosis not present

## 2022-09-27 DIAGNOSIS — Z171 Estrogen receptor negative status [ER-]: Secondary | ICD-10-CM

## 2022-09-27 DIAGNOSIS — D649 Anemia, unspecified: Secondary | ICD-10-CM

## 2022-09-27 DIAGNOSIS — Z5111 Encounter for antineoplastic chemotherapy: Secondary | ICD-10-CM | POA: Diagnosis not present

## 2022-09-27 LAB — CBC WITH DIFFERENTIAL/PLATELET
Abs Immature Granulocytes: 0.04 10*3/uL (ref 0.00–0.07)
Basophils Absolute: 0 10*3/uL (ref 0.0–0.1)
Basophils Relative: 1 %
Eosinophils Absolute: 0.2 10*3/uL (ref 0.0–0.5)
Eosinophils Relative: 6 %
HCT: 25.4 % — ABNORMAL LOW (ref 36.0–46.0)
Hemoglobin: 8.6 g/dL — ABNORMAL LOW (ref 12.0–15.0)
Immature Granulocytes: 1 %
Lymphocytes Relative: 47 %
Lymphs Abs: 1.4 10*3/uL (ref 0.7–4.0)
MCH: 30.4 pg (ref 26.0–34.0)
MCHC: 33.9 g/dL (ref 30.0–36.0)
MCV: 89.8 fL (ref 80.0–100.0)
Monocytes Absolute: 0.1 10*3/uL (ref 0.1–1.0)
Monocytes Relative: 3 %
Neutro Abs: 1.3 10*3/uL — ABNORMAL LOW (ref 1.7–7.7)
Neutrophils Relative %: 42 %
Platelets: 87 10*3/uL — ABNORMAL LOW (ref 150–400)
RBC: 2.83 MIL/uL — ABNORMAL LOW (ref 3.87–5.11)
RDW: 17.5 % — ABNORMAL HIGH (ref 11.5–15.5)
Smear Review: NORMAL
WBC: 3.1 10*3/uL — ABNORMAL LOW (ref 4.0–10.5)
nRBC: 0 % (ref 0.0–0.2)

## 2022-09-27 LAB — COMPREHENSIVE METABOLIC PANEL
ALT: 14 U/L (ref 0–44)
AST: 28 U/L (ref 15–41)
Albumin: 4 g/dL (ref 3.5–5.0)
Alkaline Phosphatase: 76 U/L (ref 38–126)
Anion gap: 12 (ref 5–15)
BUN: 9 mg/dL (ref 6–20)
CO2: 25 mmol/L (ref 22–32)
Calcium: 10.9 mg/dL — ABNORMAL HIGH (ref 8.9–10.3)
Chloride: 98 mmol/L (ref 98–111)
Creatinine, Ser: 0.8 mg/dL (ref 0.44–1.00)
GFR, Estimated: >60 mL/min (ref >60)
Glucose, Bld: 100 mg/dL — ABNORMAL HIGH (ref 70–99)
Potassium: 3 mmol/L — ABNORMAL LOW (ref 3.5–5.1)
Sodium: 135 mmol/L (ref 135–145)
Total Bilirubin: 0.9 mg/dL (ref 0.3–1.2)
Total Protein: 7.8 g/dL (ref 6.5–8.1)

## 2022-09-27 LAB — SAMPLE TO BLOOD BANK

## 2022-09-27 MED ORDER — HEPARIN SOD (PORK) LOCK FLUSH 100 UNIT/ML IV SOLN: 500.0000 [IU] | Freq: Once | INTRAVENOUS | Status: DC

## 2022-09-27 MED ORDER — SODIUM CHLORIDE 0.9% FLUSH: 10.0000 mL | Freq: Once | INTRAVENOUS | Status: DC

## 2022-09-27 MED ORDER — SODIUM CHLORIDE 0.9% FLUSH
10.0000 mL | INTRAVENOUS | Status: AC | PRN
  Administered 2022-09-27: 10 mL

## 2022-09-27 MED ORDER — HEPARIN SOD (PORK) LOCK FLUSH 100 UNIT/ML IV SOLN
500.0000 [IU] | INTRAVENOUS | Status: AC | PRN
  Administered 2022-09-27: 500 [IU]

## 2022-09-27 MED ORDER — POTASSIUM CHLORIDE CRYS ER 20 MEQ PO TBCR: 20.0000 meq | EXTENDED_RELEASE_TABLET | Freq: Every day | ORAL | 0 refills | Status: DC

## 2022-09-27 NOTE — Progress Notes (Signed)
[facility]   Telephone:([phone]) [phone] [phone]) [phone]   Clinic Follow up Note   Patient Care Team: Trey Sailors, PA as PCP - General (Physician Assistant) Rockwell Germany, RN as Oncology Nurse Navigator Mauro Kaufmann, RN as Oncology Nurse Navigator Rolm Bookbinder, MD as Consulting Physician (General Surgery) Truitt Merle, MD as Consulting Physician (Hematology) Kyung Rudd, MD as Consulting Physician (Radiation Oncology) Latanya Maudlin, MD as Consulting Physician (Orthopedic Surgery) Cordelia Poche as Physician Assistant (Hematology and Oncology)  Date of Service:  09/27/2022  CHIEF COMPLAINT: f/u of left breast cancer  CURRENT THERAPY:  Adjuvant AC, q21d, starting 09/19/22 Beryle Flock, q21d, starting 06/14/22  ASSESSMENT & PLAN:  Gabrielle [name] is a 31 y.o. pre-menopausal female with   1. 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 Staging scans negative. S/p left lumpectomy on 04/16/22 showed 4.5 cm metaplastic carcinoma with extensive sarcomatoid component, and focal DCIS. SLN biopsy on 05/14/22 was negative (0/4). -she started zoladex on 06/03/22. -she began chemotherapy with carboplatin/taxol and Keytruda on 06/14/22. Taxol switched to abraxane with C2D8 due to itching and mild numbness. 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 -she switched to [facility] with continuing [place] on 09/19/22. She tolerated moderately well with increased fatigue and weakness, leaving her unable to complete all ADL's. -labs reviewed, potassium still low at 3, calcium elevated at 10.9, overall stable anemia with hgb 8.6, plt down to 87k again. -we will give her Thanksgiving week off and resume the following week.   2. 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 -she also reports joint and bone pains after infusion, rated 5/10. She is managing with tylenol.    3. Anxiety, Insomnia -she reports a history of anxiety, relieved with marijuana -she has ambien, Xanax, and mirtazapine to use as needed.   4. Normocytic anemia:  -likely chemotherapy induced.  -iron, B12 and folate levels were recently checked without evidence of deficiency.  -Will arrange for weekly sample to blood banks in case she requires supportive transfusions in the future    5. Hypokalemia, hypercalcemia  -will increase oral K -encourage her to hydrate her self  -stop calcium supplement  -repeat lab next week    PLAN: -I refilled her KCL, increase to twice daily  -repeat lab next week  -lab, flush, f/u, and AC/Keytruda week of 11/27 and 12/18  -GCF-S injection on day 3   No problem-specific Assessment & Plan notes found for this encounter.   SUMMARY OF ONCOLOGIC HISTORY: Oncology History Overview Note   Cancer Staging  Malignant neoplasm of upper-outer quadrant of left breast in female, estrogen receptor negative ([place]) Staging form: Breast, AJCC 8th Edition - Clinical stage from 03/11/2022: Stage IIB (cT2, cN0, cM0, G3, ER-, PR-, HER2-) - Signed by Truitt Merle, MD on 03/19/2022    Malignant neoplasm of upper-outer quadrant of left breast in female, estrogen receptor negative ([place])  03/08/2022 Mammogram   CLINICAL DATA:  31 year old female presenting for evaluation of a palpable lump in the left breast which she feels has increased in size since she first identified  it. Her mother was diagnosed with breast cancer within the last 6 months. She also has family history of breast cancer in multiple aunts  and her maternal grandmother.   EXAM: DIGITAL DIAGNOSTIC BILATERAL MAMMOGRAM WITH TOMOSYNTHESIS AND CAD; ULTRASOUND LEFT BREAST LIMITED  IMPRESSION: 1. There is a suspicious 3.7 cm mass in the left breast at 12 o'clock.   2.  No evidence of left axillary lymphadenopathy.   3.  No evidence of malignancy in the right breast.   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 Based on the biopsy, the carcinoma appears Nottingham grade 3 of 3 and measures 1.2 cm in greatest linear extent.  PROGNOSTIC INDICATORS Results: The tumor cells are NEGATIVE for Her2 (0). Estrogen Receptor: 0%, NEGATIVE Progesterone Receptor: 0%, NEGATIVE Proliferation Marker Ki67: 40%   03/15/2022 Initial Diagnosis   Malignant neoplasm of upper-outer quadrant of left breast in female, estrogen receptor negative ([place])   03/28/2022 Genetic Testing   Negative hereditary cancer genetic testing: no pathogenic variants detected in [facility] BRCAPlus Panel or [facility] CustomNext-Cancer +RNAinsight Panel.  Report dates are 03/28/2022 and 03/31/2022. [facility] Kitchen   The BRCAplus panel offered by [employer] and includes sequencing and deletion/duplication analysis for the following 8 genes: ATM, BRCA1, BRCA2, CDH1, CHEK2, PALB2, PTEN, and TP53.  The CustomNext-Cancer+RNAinsight panel offered by [facility] includes sequencing and rearrangement analysis for the following 47 genes:  APC, ATM, AXIN2, BARD1, BMPR1A, BRCA1, BRCA2, BRIP1, CDH1, CDK4, CDKN2A, CHEK2,  DICER1, EPCAM, GREM1, HOXB13, MEN1, MLH1, MSH2, MSH3, MSH6, MUTYH, NBN, NF1, NF2, NTHL1, PALB2, PMS2, POLD1, POLE, PTEN, RAD51C, RAD51D, RECQL, RET, SDHA, SDHAF2, SDHB, SDHC, SDHD, SMAD4, SMARCA4, STK11, TP53, TSC1, TSC2, and VHL.  RNA data is routinely analyzed for use in variant interpretation for all genes.   05/14/2022 Cancer Staging   Staging form: Breast, AJCC 8th Edition - Pathologic stage from 05/14/2022: Stage IIA (pT2, pN0, cM0, G3, ER-, PR-, HER2-) - Signed by Truitt Merle, MD on 05/28/2022 Stage prefix: Initial diagnosis Histologic grading system: 3 grade system Residual tumor (R): R0 - None   06/13/2022 -  Chemotherapy   Patient is on Treatment Plan : BREAST Pembrolizumab (200) D1 + Carboplatin (1.5) D1,8,15 + Paclitaxel (80) D1,8,15 q21d X 4 cycles / Pembrolizumab (200) D1 + AC D1 q21d x 4 cycles     06/14/2022 - 07/05/2022 Chemotherapy   Patient is on Treatment Plan : BREAST Pembrolizumab (200) D1 + Carboplatin (5) D1 + Paclitaxel (80) D1,8,15 q21d X 4 cycles / Pembrolizumab (200) D1 + AC D1 q21d x 4 cycles        INTERVAL HISTORY:  Gabrielle [name] is here for a follow up of breast cancer. She was last seen by PA Cassie on 08/16/22. She presents to the clinic alone. She reports chemo went overall well. She reports persistent fatigue and weakness since treatment. She explains her sister has to help her take a shower. She also reports bone pain in her legs, rated 5/10, relieved some with tylenol. She notes she is also not eating much, in part due to heartburn;  her weight is down today. She tells me her 74 y.o. son "keeps me busy." She denies fever or diarrhea.   All other systems were reviewed with the patient and are negative.  MEDICAL HISTORY:  Past Medical History:  Diagnosis Date   Arthritis    Bilateral Knees   Cancer ([place]) 03/11/2022   Breast   Family history of breast cancer 03/21/2022   Family history of prostate cancer 03/21/2022   Hypercholesteremia     SURGICAL  HISTORY: Past Surgical History:  Procedure Laterality Date   AXILLARY SENTINEL NODE BIOPSY Left 05/14/2022   Procedure: LEFT AXILLARY SENTINEL NODE BIOPSY;  Surgeon: Rolm Bookbinder, MD;  Location: [facility];  Service: General;  Laterality: Left;  GEN w/ PEC BLOCK   BREAST BIOPSY Left 03/11/2022   BREAST LUMPECTOMY WITH AXILLARY LYMPH NODE BIOPSY Left 04/16/2022   Procedure: LEFT BREAST LUMPECTOMY WITH LEFT AXILLARY SENTINEL LYMPH NODE BIOPSY;  Surgeon: Rolm Bookbinder, MD;  Location: [facility];  Service: General;  Laterality: Left;   DENTAL SURGERY     PORTACATH PLACEMENT Right 04/16/2022   Procedure: INSERTION PORT-A-CATH;  Surgeon: Rolm Bookbinder, MD;  Location: [facility];  Service: General;  Laterality: Right;    I have reviewed the social history and family history with the patient and they are unchanged from previous note.  ALLERGIES:  has No Known Allergies.  MEDICATIONS:  Current Outpatient Medications  Medication Sig Dispense Refill   acetaminophen (TYLENOL) 500 MG tablet Take 1,000 mg by mouth every 6 (six) hours as needed (pain.).     ALPRAZolam (XANAX) 0.5 MG tablet Take 1 tablet (0.5 mg total) by mouth as needed for anxiety. 30 tablet 0   aspirin-acetaminophen-caffeine (EXCEDRIN MIGRAINE) 250-250-65 MG tablet Take by mouth every 6 (six) hours as needed for headache.     gabapentin (NEURONTIN) 100 MG capsule Take 1-2 capsules (100-200 mg total) by mouth 2 (two) times daily. 60 capsule 0   ibuprofen (ADVIL) 200 MG tablet Take 200 mg by mouth every 8 (eight) hours as needed (pain).     methocarbamol (ROBAXIN) 500 MG tablet Take by mouth as needed.     mirtazapine (REMERON) 15 MG tablet Take 1 tablet (15 mg total) by mouth at bedtime. 30 tablet 2   nystatin (MYCOSTATIN/NYSTOP) powder Apply 1 Application topically 3 (three) times daily. 30 g 0   ondansetron (ZOFRAN) 4 MG tablet Take 4 mg by mouth every 8 (eight) hours as needed for nausea or  vomiting.     potassium chloride SA (KLOR-CON M) 20 MEQ tablet Take 1 tablet (20 mEq total) by mouth daily. 30 tablet 0   prochlorperazine (COMPAZINE) 10 MG tablet Take 1 tablet (10 mg total) by mouth every 6 (six) hours as needed for nausea or vomiting. 30 tablet 0   promethazine (PHENERGAN) 25 MG tablet Take 1 tablet (25 mg total) by mouth every 6 (six) hours as needed for nausea or vomiting. 30 tablet 0   sulfamethoxazole-trimethoprim (BACTRIM DS) 800-160 MG tablet Take 1 tablet by mouth 2 (two) times daily. 14 tablet 0   zolpidem (AMBIEN CR) 12.5 MG CR tablet Take 1 tablet (12.5 mg total) by mouth at bedtime as needed for sleep. 30 tablet 0   No current facility-administered medications for this visit.   Facility-Administered Medications Ordered in Other Visits  Medication Dose Route Frequency Provider Last Rate Last Admin   diphenhydrAMINE (BENADRYL) 50 MG/ML injection  famotidine (PEPCID) 20-0.9 MG/50ML-% IVPB            heparin lock flush 100 unit/mL  500 Units Intracatheter Once Truitt Merle, MD       sodium chloride flush (NS) 0.9 % injection 10 mL  10 mL Intracatheter Once Truitt Merle, MD        PHYSICAL EXAMINATION: ECOG PERFORMANCE STATUS: 2 - Symptomatic, <50% confined to bed  Vitals:   09/27/22 0850 09/27/22 0852  BP: 94/71 97/68  Pulse: (!) 111 (!) 126  Resp:    Temp:    SpO2:     Wt Readings from Last 3 Encounters:  09/27/22 254 lb 11.2 oz (115.5 kg)  09/19/22 268 lb 9.6 oz (121.8 kg)  09/06/22 271 lb 12.8 oz (123.3 kg)     GENERAL:alert, no distress and comfortable SKIN: skin color, texture, turgor are normal, no rashes or significant lesions EYES: normal, Conjunctiva are pink and non-injected, sclera clear  LUNGS: clear to auscultation and percussion with normal breathing effort HEART: regular rate & rhythm and no murmurs and no lower extremity edema ABDOMEN:abdomen soft, non-tender and normal bowel sounds NEURO: alert & oriented x 3 with fluent speech,  no focal motor/sensory deficits  LABORATORY DATA:  I have reviewed the data as listed    Latest Ref Rng & Units 09/27/2022    8:19 AM 09/19/2022   10:19 AM 09/06/2022    9:55 AM  CBC  WBC 4.0 - 10.5 K/uL 3.1  6.9  6.8   Hemoglobin 12.0 - 15.0 g/dL 8.6  9.5  8.5   Hematocrit 36.0 - 46.0 % 25.4  28.8  25.4   Platelets 150 - 400 K/uL 87  323  258         Latest Ref Rng & Units 09/27/2022    8:19 AM 09/19/2022   10:19 AM 09/06/2022    9:55 AM  CMP  Glucose 70 - 99 mg/dL 100  112  96   BUN 6 - 20 mg/dL _0 Creatinine 0.44 - 1.00 mg/dL 0.80  1.04  1.01   Sodium 135 - 145 mmol/L 135  137  138   Potassium 3.5 - 5.1 mmol/L 3.0  3.1  3.0   Chloride 98 - 111 mmol/L 98  103  101   CO2 22 - 32 mmol/L _1 Calcium 8.9 - 10.3 mg/dL 10.9  10.6  10.3   Total Protein 6.5 - 8.1 g/dL 7.8  7.6  7.5   Total Bilirubin 0.3 - 1.2 mg/dL 0.9  0.5  0.3   Alkaline Phos 38 - 126 U/L 76  87  98   AST 15 - 41 U/L 28  39  40   ALT 0 - 44 U/L _2 RADIOGRAPHIC STUDIES: I have personally reviewed the radiological images as listed and agreed with the findings in the report. No results found.    Orders Placed This Encounter  Procedures   CBC with Differential ([facility] Only)    Standing Status:   Future    Standing Expiration Date:   10/15/2023   CMP ([facility] only)    Standing Status:   Future    Standing Expiration Date:   10/15/2023   T4    Standing Status:   Future    Standing Expiration Date:   10/15/2023   TSH    Standing Status:   Future  Standing Expiration Date:   10/15/2023   CBC with Differential (Cancer Center Only)    Standing Status:   Future    Standing Expiration Date:   11/05/2023   CMP ([facility] only)    Standing Status:   Future    Standing Expiration Date:   11/05/2023   All questions were answered. The patient knows to call the clinic with any problems, questions or concerns. No barriers to learning was detected. The total  time spent in the appointment was 25 minutes.     Truitt Merle, MD 09/27/2022   I, Wilburn Mylar, am acting as scribe for Truitt Merle, MD.   I have reviewed the above documentation for accuracy and completeness, and I agree with the above.

## 2022-09-30 ENCOUNTER — Encounter: Payer: Self-pay | Admitting: *Deleted

## 2022-09-30 ENCOUNTER — Telehealth: Payer: Self-pay | Admitting: Hematology

## 2022-09-30 ENCOUNTER — Telehealth: Payer: Self-pay

## 2022-09-30 MED ORDER — POTASSIUM CHLORIDE CRYS ER 20 MEQ PO TBCR: 20.0000 meq | EXTENDED_RELEASE_TABLET | Freq: Two times a day (BID) | ORAL | 0 refills | Status: DC

## 2022-09-30 NOTE — Telephone Encounter (Signed)
-----   Message from Truitt Merle, MD sent at 09/28/2022  7:26 AM EST ----- Her K is low and calcium is high, make sure she has increased her oral KCL twice daily and avoid any calcium supplement. Thanks   Truitt Merle

## 2022-09-30 NOTE — Telephone Encounter (Signed)
Poke with patient confirming upcoming appointments

## 2022-09-30 NOTE — Telephone Encounter (Signed)
Notified patient of message below Refill of K sent

## 2022-10-03 ENCOUNTER — Other Ambulatory Visit: Payer: Medicaid Other

## 2022-10-03 ENCOUNTER — Inpatient Hospital Stay: Payer: Medicaid Other

## 2022-10-08 ENCOUNTER — Inpatient Hospital Stay: Payer: Medicaid Other

## 2022-10-08 ENCOUNTER — Other Ambulatory Visit: Payer: Medicaid Other

## 2022-10-08 ENCOUNTER — Inpatient Hospital Stay: Payer: Medicaid Other | Admitting: Hematology

## 2022-10-09 ENCOUNTER — Inpatient Hospital Stay: Payer: Medicaid Other

## 2022-10-09 IMAGING — CT CT ABD-PELV W/ CM
2 of 4 series · 14 of 36 positions shown, 17 images · IV contrast (APPLIED)
Comparison: None Available.

CLINICAL DATA: Breast cancer.  Staging.  * Tracking Code: BO *

EXAM:
CT CHEST, ABDOMEN, AND PELVIS WITH CONTRAST
TECHNIQUE: Multidetector CT imaging of the chest, abdomen and pelvis was
performed following the standard protocol during bolus
administration of intravenous contrast.

[Series 2: cap with · axial · 0.98mm/px · z∈[-375,+170]mm · 11 of 129 slices shown, 14 images]
[im 10/129  mediastinal]
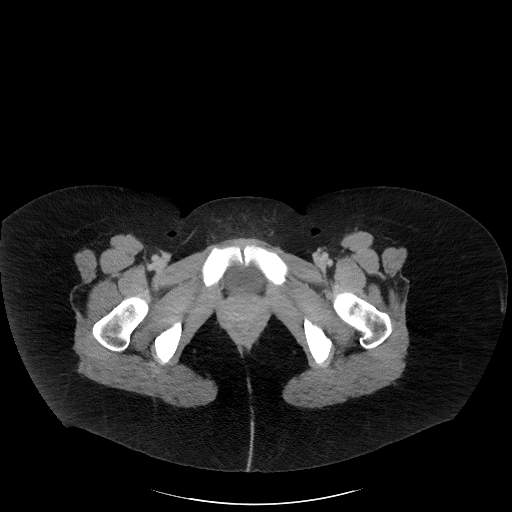
[im 10/129  lung]
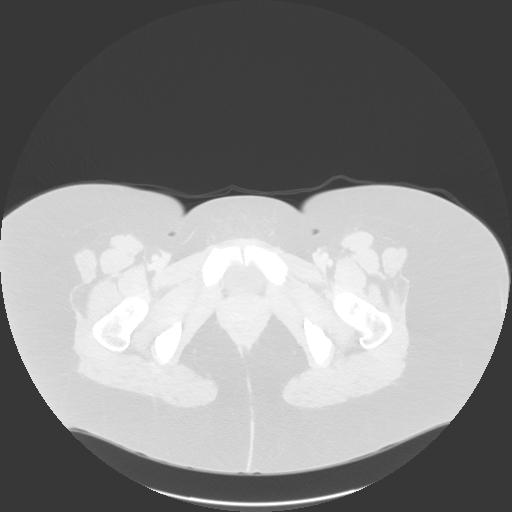
[im 20/129  lung]
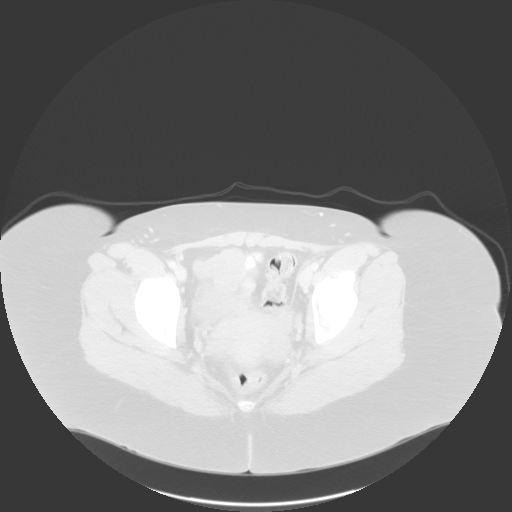
[im 30/129  lung]
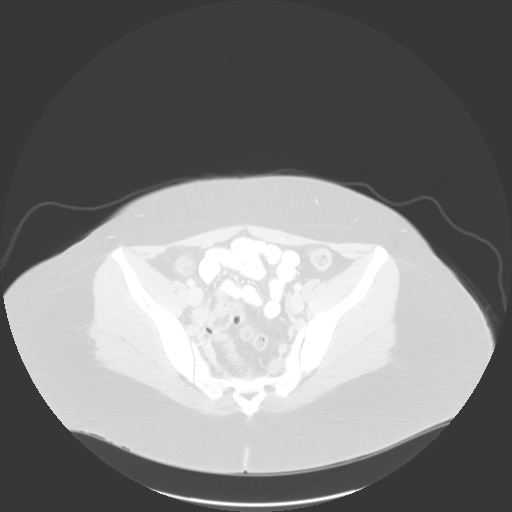
[im 40/129  lung]
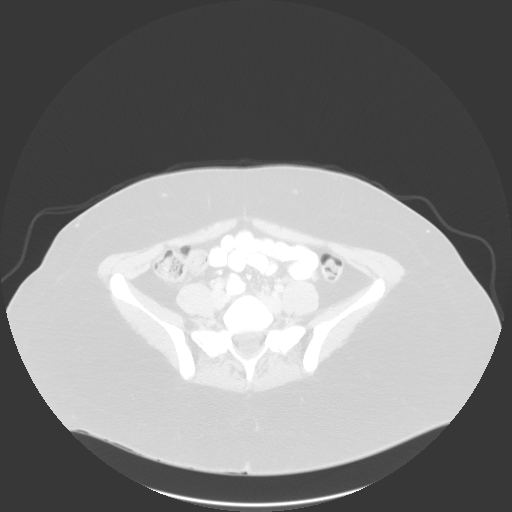
[im 50/129  mediastinal]
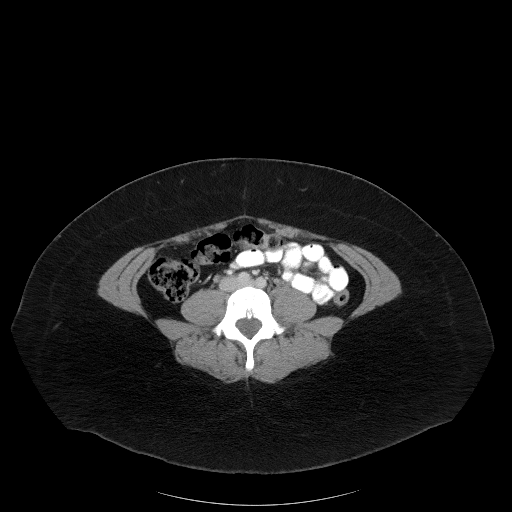
[im 50/129  lung]
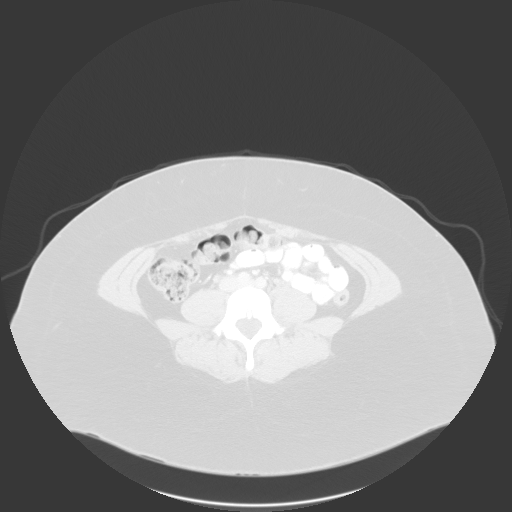
[im 69/129  lung]
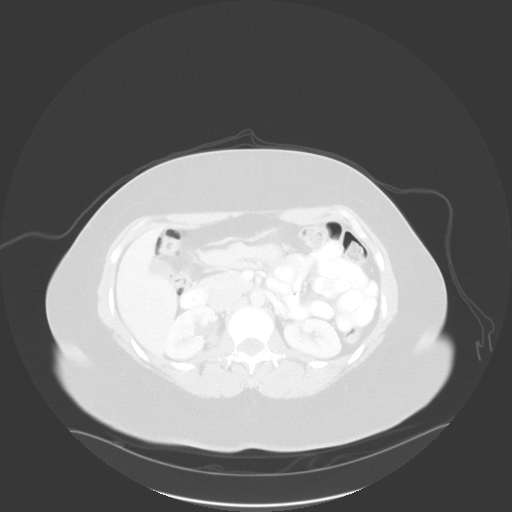
[im 79/129  lung]
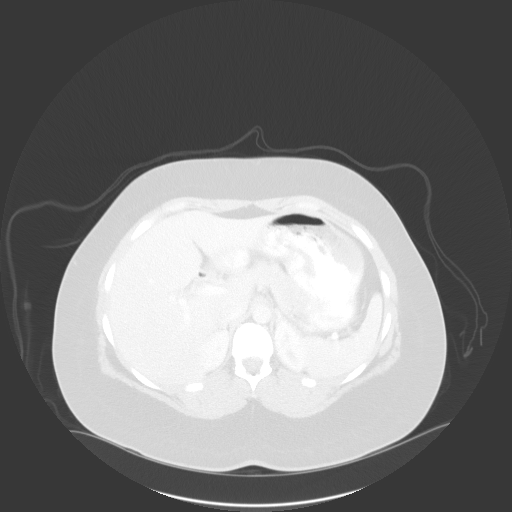
[im 89/129  lung]
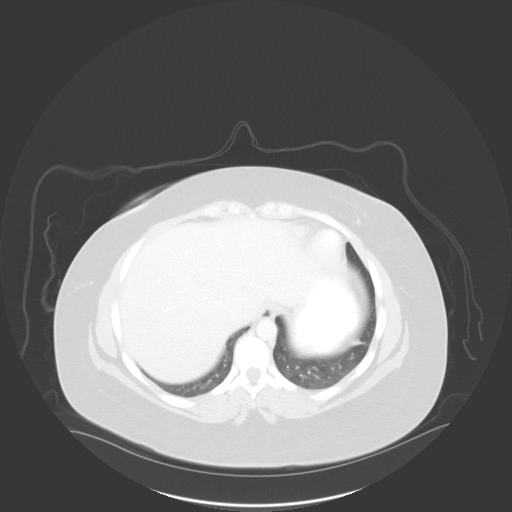
[im 99/129  mediastinal]
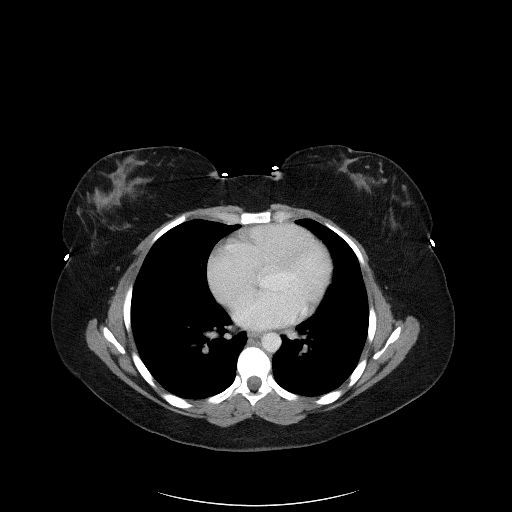
[im 99/129  lung]
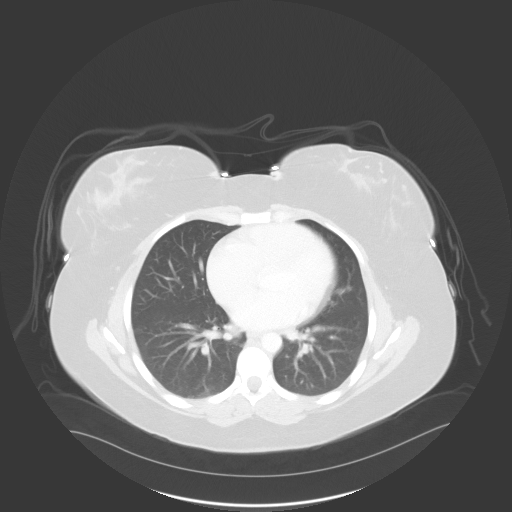
[im 109/129  lung]
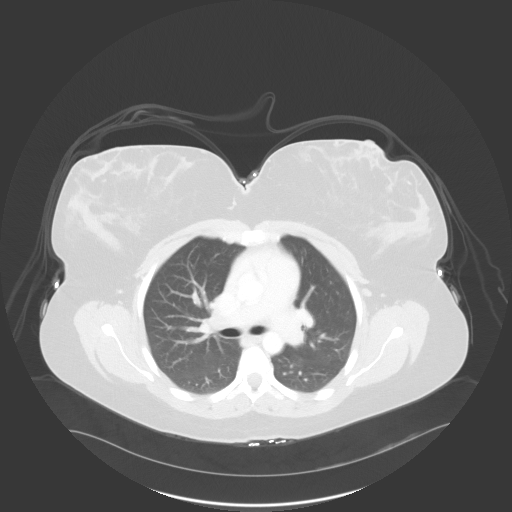
[im 119/129  lung]
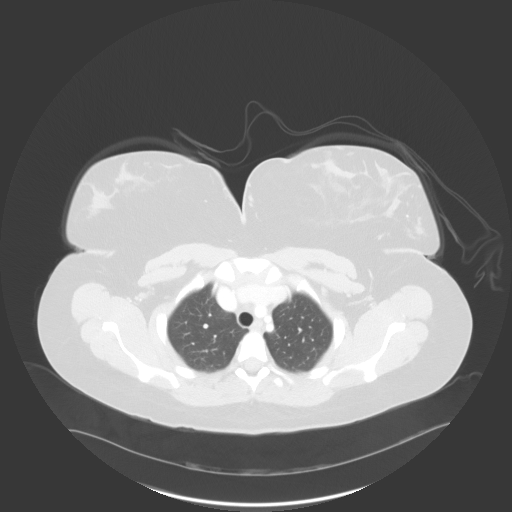

[Series 5: coronals · coronal · 0.99mm/px · 3 of 163 slices shown]
[im 33/163  lung]
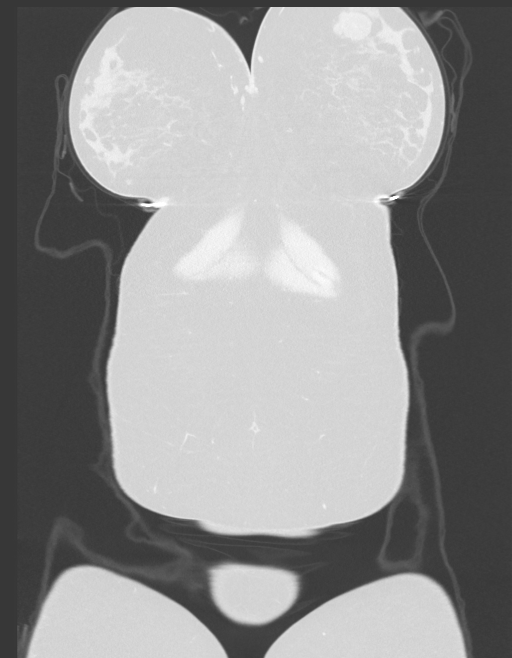
[im 65/163  lung]
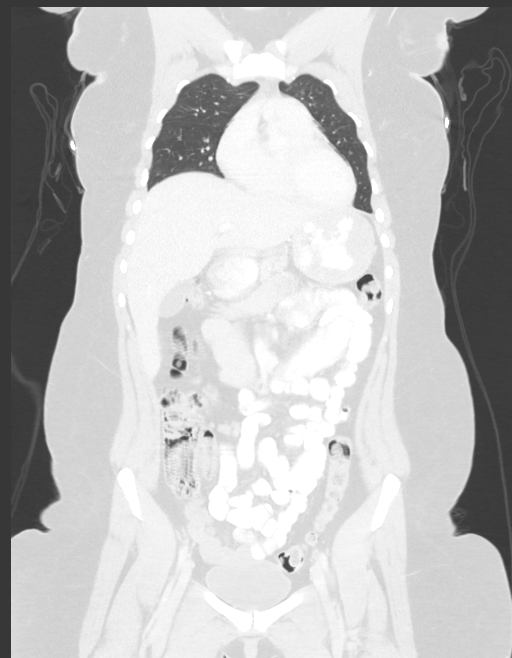
[im 98/163  lung]
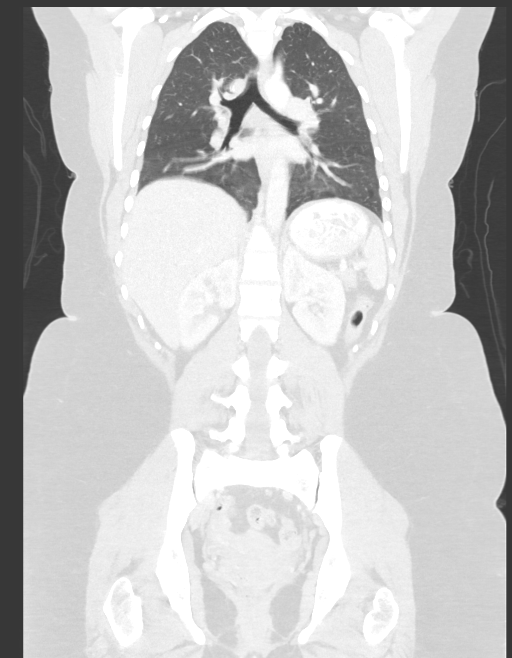

[14 of 36 positions shown; findings below may reference images not displayed]

RADIATION DOSE REDUCTION: This exam was performed according to the
departmental dose-optimization program which includes automated
exposure control, adjustment of the mA and/or kV according to
patient size and/or use of iterative reconstruction technique.

CONTRAST:  100mL OMNIPAQUE IOHEXOL 300 MG/ML  SOLN
FINDINGS: CT CHEST FINDINGS

Cardiovascular: The heart size is normal. No substantial pericardial
effusion. No thoracic aortic aneurysm. No substantial
atherosclerosis of the thoracic aorta.

Mediastinum/Nodes: No mediastinal lymphadenopathy. There is no hilar
lymphadenopathy. The esophagus has normal imaging features. There is
no axillary lymphadenopathy. No evidence for subpectoral or thoracic
inlet lymphadenopathy. No evidence for internal mammary
lymphadenopathy.

Lungs/Pleura: No suspicious pulmonary nodule or mass. No focal
airspace consolidation. No pleural effusion.

Musculoskeletal: No worrisome lytic or sclerotic osseous
abnormality. 3.7 cm soft tissue mass identified in the left breast.

CT ABDOMEN PELVIS FINDINGS

Hepatobiliary: No suspicious focal abnormality within the liver
parenchyma. There is no evidence for gallstones, gallbladder wall
thickening, or pericholecystic fluid. No intrahepatic or
extrahepatic biliary dilation.

Pancreas: No focal mass lesion. No dilatation of the main duct. No
intraparenchymal cyst. No peripancreatic edema.

Spleen: No splenomegaly. No focal mass lesion.

Adrenals/Urinary Tract: No adrenal nodule or mass. Left kidney
unremarkable. Mild fullness of the right intrarenal collecting
system evident without associated hydroureter, nonspecific. No
evidence for hydroureter. The urinary bladder appears normal for the
degree of distention.

Stomach/Bowel: Stomach is distended with food and contrast material.
Duodenum is normally positioned as is the ligament of Treitz. No
small bowel wall thickening. No small bowel dilatation. The terminal
ileum is normal. The appendix is not well visualized, but there is
no edema or inflammation in the region of the cecum. No gross
colonic mass. No colonic wall thickening.

Vascular/Lymphatic: No abdominal aortic aneurysm. No abdominal
aortic atherosclerotic calcification. There is no gastrohepatic or
hepatoduodenal ligament lymphadenopathy. No retroperitoneal or
mesenteric lymphadenopathy. No pelvic sidewall lymphadenopathy.

Reproductive: The uterus is unremarkable.  There is no adnexal mass.

Other: No intraperitoneal free fluid.

Musculoskeletal: No worrisome lytic or sclerotic osseous
abnormality.
IMPRESSION: 1. 3.7 cm soft tissue mass in the left breast with no evidence for
metastatic disease in the chest, abdomen, or pelvis.
2. Mild fullness of the right intrarenal collecting system without
associated hydroureter. This is nonspecific and may be physiologic
or related to a component of UPJ obstruction. Attention on follow-up
recommended.

## 2022-10-11 ENCOUNTER — Inpatient Hospital Stay: Payer: Medicaid Other

## 2022-10-11 ENCOUNTER — Encounter: Payer: Self-pay | Admitting: Hematology

## 2022-10-14 MED FILL — Fosaprepitant Dimeglumine For IV Infusion 150 MG (Base Eq): INTRAVENOUS | Qty: 5 | Status: AC

## 2022-10-15 ENCOUNTER — Inpatient Hospital Stay: Payer: Medicaid Other

## 2022-10-15 ENCOUNTER — Encounter: Payer: Self-pay | Admitting: Hematology

## 2022-10-15 ENCOUNTER — Other Ambulatory Visit: Payer: Self-pay

## 2022-10-15 ENCOUNTER — Inpatient Hospital Stay (HOSPITAL_BASED_OUTPATIENT_CLINIC_OR_DEPARTMENT_OTHER): Payer: Medicaid Other | Admitting: Hematology

## 2022-10-15 VITALS — BP 105/67 | HR 111 | Temp 98.1°F | Resp 18

## 2022-10-15 DIAGNOSIS — Z171 Estrogen receptor negative status [ER-]: Secondary | ICD-10-CM | POA: Diagnosis not present

## 2022-10-15 DIAGNOSIS — Z95828 Presence of other vascular implants and grafts: Secondary | ICD-10-CM

## 2022-10-15 DIAGNOSIS — C50412 Malignant neoplasm of upper-outer quadrant of left female breast: Secondary | ICD-10-CM

## 2022-10-15 DIAGNOSIS — Z5111 Encounter for antineoplastic chemotherapy: Secondary | ICD-10-CM | POA: Diagnosis not present

## 2022-10-15 MED ORDER — DEXAMETHASONE 4 MG PO TABS: 4.0000 mg | ORAL_TABLET | Freq: Two times a day (BID) | ORAL | 0 refills | Status: DC

## 2022-10-15 MED ORDER — SODIUM CHLORIDE 0.9% FLUSH: 10.0000 mL | Freq: Once | INTRAVENOUS | Status: DC

## 2022-10-15 MED ORDER — PANTOPRAZOLE SODIUM 20 MG PO TBEC: 20.0000 mg | DELAYED_RELEASE_TABLET | Freq: Every day | ORAL | 1 refills | Status: DC

## 2022-10-15 MED ORDER — HEPARIN SOD (PORK) LOCK FLUSH 100 UNIT/ML IV SOLN: 500.0000 [IU] | Freq: Once | INTRAVENOUS | Status: DC

## 2022-10-15 NOTE — Progress Notes (Signed)
Gabrielle [name]   [phone] [phone]) [phone]   Clinic Follow up Note   Patient Care Team: Trey Sailors, PA as PCP - General (Physician Assistant) Rockwell Germany, RN as Oncology Nurse Navigator Mauro Kaufmann, RN as Oncology Nurse Navigator Rolm Bookbinder, MD as Consulting Physician (General Surgery) Truitt Merle, MD as Consulting Physician (Hematology) Kyung Rudd, MD as Consulting Physician (Radiation Oncology) Latanya Maudlin, MD as Consulting Physician (Orthopedic Surgery) Cordelia Poche as Physician Assistant (Hematology and Oncology)  Date of Service:  10/15/2022  CHIEF COMPLAINT: f/u of  left breast cancer   CURRENT THERAPY: Adjuvant AC, q21d, starting 09/19/22 Beryle Flock, q21d, starting 06/14/22   ASSESSMENT:  Gabrielle [name] is a 31 y.o. female with   1. 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 Staging scans negative. S/p left lumpectomy on 04/16/22 showed 4.5 cm metaplastic carcinoma with extensive sarcomatoid component, and focal DCIS. SLN biopsy on 05/14/22 was negative (0/4). -she started zoladex on 06/03/22. -she began chemotherapy with carboplatin/taxol and Keytruda on 06/14/22. Taxol switched to abraxane with C2D8 due to itching and mild numbness. 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 -she switched to [facility] with continuing [place] on 09/19/22. She tolerated poorly with persistent N/V, fatigue and anorexia    2. Chemo toxicities: Neuropathy G2, Fatigue/Weakness, N/V and anorexia  -she developed neuropathy in her feet after C2D8 carbo/taxol  -She has developed persistent nausea vomiting, anorexia after first cycle AC -We discussed symptom management.  I called in dexamethasone 4 mg daily for 5 days for her today, she will use  Phenergan instead of Compazine, and continue Zofran as needed -Will hold chemotherapy today -We discussed the option of stopping chemo, versus try low-dose chemo again in a few weeks, she is agreeable to try low-dose chemo later next week -Will continue Keytruda to complete 1 year therapy, she is agreeable.    3. Anxiety, Insomnia -she reports a history of anxiety, relieved with marijuana -she has ambien, Xanax, and mirtazapine to use as needed.   4. Normocytic anemia:  -likely chemotherapy induced.  -iron, B12 and folate levels were recently checked without evidence of deficiency.  -Will arrange for weekly sample to blood banks in case she requires supportive transfusions in the future    5. Hypokalemia, hypercalcemia  -will increase oral K -encourage her to hydrate her self  -stop calcium supplement  -repeat lab next week     PLAN: -Due to persistent nausea vomiting, anorexia, will hold chemotherapy today -She is drinking liquids adequately, she does not want IV fluids or IV antiemetics today -Hold chemo today, rescheduled to later next week -Follow-up with cycle 2 chemotherapy next week .   SUMMARY OF ONCOLOGIC HISTORY: Oncology History Overview Note   Cancer Staging  Malignant neoplasm of upper-outer quadrant of left breast in female, estrogen receptor negative ([place]) Staging form: Breast, AJCC 8th Edition - Clinical stage from 03/11/2022: Stage IIB (cT2, cN0, cM0, G3, ER-, PR-, HER2-) - Signed by Truitt Merle, MD on 03/19/2022    Malignant neoplasm of upper-outer quadrant of left breast in female, estrogen receptor negative ([place])  03/08/2022 Mammogram   CLINICAL DATA:  31 year old female presenting for evaluation of a palpable lump in the left breast which she feels has increased in size since she first identified it. Her mother was diagnosed  with breast cancer within the last 6 months. She also has family history of breast cancer in multiple aunts and her maternal  grandmother.   EXAM: DIGITAL DIAGNOSTIC BILATERAL MAMMOGRAM WITH TOMOSYNTHESIS AND CAD; ULTRASOUND LEFT BREAST LIMITED  IMPRESSION: 1. There is a suspicious 3.7 cm mass in the left breast at 12 o'clock.   2.  No evidence of left axillary lymphadenopathy.   3.  No evidence of malignancy in the right breast.   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 Based on the biopsy, the carcinoma appears Nottingham grade 3 of 3 and measures 1.2 cm in greatest linear extent.  PROGNOSTIC INDICATORS Results: The tumor cells are NEGATIVE for Her2 (0). Estrogen Receptor: 0%, NEGATIVE Progesterone Receptor: 0%, NEGATIVE Proliferation Marker Ki67: 40%   03/15/2022 Initial Diagnosis   Malignant neoplasm of upper-outer quadrant of left breast in female, estrogen receptor negative ([place])   03/28/2022 Genetic Testing   Negative hereditary cancer genetic testing: no pathogenic variants detected in [facility] BRCAPlus Panel or [facility] CustomNext-Cancer +RNAinsight Panel.  Report dates are 03/28/2022 and 03/31/2022. Marland Kitchen   The BRCAplus panel offered by [employer] and includes sequencing and deletion/duplication analysis for the following 8 genes: ATM, BRCA1, BRCA2, CDH1, CHEK2, PALB2, PTEN, and TP53.  The CustomNext-Cancer+RNAinsight panel offered by [facility] includes sequencing and rearrangement analysis for the following 47 genes:  APC, ATM, AXIN2, BARD1, BMPR1A, BRCA1, BRCA2, BRIP1, CDH1, CDK4, CDKN2A, CHEK2, DICER1, EPCAM, GREM1,  HOXB13, MEN1, MLH1, MSH2, MSH3, MSH6, MUTYH, NBN, NF1, NF2, NTHL1, PALB2, PMS2, POLD1, POLE, PTEN, RAD51C, RAD51D, RECQL, RET, SDHA, SDHAF2, SDHB, SDHC, SDHD, SMAD4, SMARCA4, STK11, TP53, TSC1, TSC2, and VHL.  RNA data is routinely analyzed for use in variant interpretation for all genes.   05/14/2022 Cancer Staging   Staging form: Breast, AJCC 8th Edition - Pathologic stage from 05/14/2022: Stage IIA (pT2, pN0, cM0, G3, ER-, PR-, HER2-) - Signed by Truitt Merle, MD on 05/28/2022 Stage prefix: Initial diagnosis Histologic grading system: 3 grade system Residual tumor (R): R0 - None   06/13/2022 -  Chemotherapy   Patient is on Treatment Plan : BREAST Pembrolizumab (200) D1 + Carboplatin (1.5) D1,8,15 + Paclitaxel (80) D1,8,15 q21d X 4 cycles / Pembrolizumab (200) D1 + AC D1 q21d x 4 cycles     06/14/2022 - 07/05/2022 Chemotherapy   Patient is on Treatment Plan : BREAST Pembrolizumab (200) D1 + Carboplatin (5) D1 + Paclitaxel (80) D1,8,15 q21d X 4 cycles / Pembrolizumab (200) D1 + AC D1 q21d x 4 cycles        INTERVAL HISTORY:  Gabrielle [name] is here for a follow up of left breast cancer She was last seen by me on 09/27/2022 She presents to the clinic with family member. Pt reports of nausea from last tx. Pt not able to eat food. Pt reports of using Zofran for nausea. Pt tolerates liquids, like Boost and Ensure.Pt said she eats soup and that seems to help. Pt vomiting is still present.Pt report that she is feels like she is loosing weight.Pt states of having weakness  in the legs, gets help from her mom for bathing.   All other systems were reviewed with the patient and are negative.  MEDICAL HISTORY:  Past Medical History:  Diagnosis Date   Arthritis    Bilateral Knees   Cancer ([place]) 03/11/2022   Breast   Family history of breast cancer 03/21/2022   Family history of prostate cancer 03/21/2022   Hypercholesteremia     SURGICAL HISTORY: Past Surgical History:  Procedure Laterality Date    AXILLARY SENTINEL NODE BIOPSY Left 05/14/2022   Procedure: LEFT AXILLARY SENTINEL NODE BIOPSY;  Surgeon: Rolm Bookbinder, MD;  Location: [facility];  Service: General;  Laterality: Left;  GEN w/ PEC BLOCK   BREAST BIOPSY Left 03/11/2022   BREAST LUMPECTOMY WITH AXILLARY LYMPH NODE BIOPSY Left 04/16/2022   Procedure: LEFT BREAST LUMPECTOMY WITH LEFT AXILLARY SENTINEL LYMPH NODE BIOPSY;  Surgeon: Rolm Bookbinder, MD;  Location: [facility];  Service: General;  Laterality: Left;   DENTAL SURGERY     PORTACATH PLACEMENT Right 04/16/2022   Procedure: INSERTION PORT-A-CATH;  Surgeon: Rolm Bookbinder, MD;  Location: [facility];  Service: General;  Laterality: Right;    I have reviewed the social history and family history with the patient and they are unchanged from previous note.  ALLERGIES:  has No Known Allergies.  MEDICATIONS:  Current Outpatient Medications  Medication Sig Dispense Refill   dexamethasone (DECADRON) 4 MG tablet Take 1 tablet (4 mg total) by mouth 2 (two) times daily with a meal. 15 tablet 0   pantoprazole (PROTONIX) 20 MG tablet Take 1 tablet (20 mg total) by mouth daily. 30 tablet 1   acetaminophen (TYLENOL) 500 MG tablet Take 1,000 mg by mouth every 6 (six) hours as needed (pain.).     ALPRAZolam (XANAX) 0.5 MG tablet Take 1 tablet (0.5 mg total) by mouth as needed for anxiety. 30 tablet 0   aspirin-acetaminophen-caffeine (EXCEDRIN MIGRAINE) 250-250-65 MG tablet Take by mouth every 6 (six) hours as needed for headache.     gabapentin (NEURONTIN) 100 MG capsule Take 1-2 capsules (100-200 mg total) by mouth 2 (two) times daily. 60 capsule 0   ibuprofen (ADVIL) 200 MG tablet Take 200 mg by mouth every 8 (eight) hours as needed (pain).     methocarbamol (ROBAXIN) 500 MG tablet Take by mouth as needed.     mirtazapine (REMERON) 15 MG tablet Take 1 tablet (15 mg total) by mouth at bedtime. 30 tablet 2   nystatin (MYCOSTATIN/NYSTOP) powder Apply 1  Application topically 3 (three) times daily. 30 g 0   ondansetron (ZOFRAN) 4 MG tablet Take 4 mg by mouth every 8 (eight) hours as needed for nausea or vomiting.     potassium chloride SA (KLOR-CON M) 20 MEQ tablet Take 1 tablet (20 mEq total) by mouth 2 (two) times daily. 60 tablet 0   prochlorperazine (COMPAZINE) 10 MG tablet Take 1 tablet (10 mg total) by mouth every 6 (six) hours as needed for nausea or vomiting. 30 tablet 0   promethazine (PHENERGAN) 25 MG tablet Take 1 tablet (25 mg total) by mouth every 6 (six) hours as needed for nausea or vomiting. 30 tablet 0   sulfamethoxazole-trimethoprim (BACTRIM DS) 800-160 MG tablet Take 1 tablet by mouth 2 (two) times daily. 14 tablet 0   zolpidem (AMBIEN CR) 12.5 MG CR tablet Take 1 tablet (12.5 mg total) by mouth at bedtime as needed for sleep. 30 tablet 0   No current facility-administered medications for this  visit.   Facility-Administered Medications Ordered in Other Visits  Medication Dose Route Frequency Provider Last Rate Last Admin   diphenhydrAMINE (BENADRYL) 50 MG/ML injection            famotidine (PEPCID) 20-0.9 MG/50ML-% IVPB             PHYSICAL EXAMINATION: ECOG PERFORMANCE STATUS: 3 - Symptomatic, >50% confined to bed  Vitals:   10/15/22 0907  BP: 105/67  Pulse: (!) 111  Resp: 18  Temp: 98.1 F (36.7 C)  SpO2: 99%   Wt Readings from Last 3 Encounters:  09/27/22 254 lb 11.2 oz (115.5 kg)  09/19/22 268 lb 9.6 oz (121.8 kg)  09/06/22 271 lb 12.8 oz (123.3 kg)    GENERAL:alert, no distress and comfortable SKIN: skin color, texture, turgor are normal, no rashes or significant lesions EYES: normal, Conjunctiva are pink and non-injected, sclera clear NECK: supple, thyroid normal size, non-tender, without nodularity LYMPH:  no palpable lymphadenopathy in the cervical, axillary  LUNGS: clear to auscultation and percussion with normal breathing effort HEART: regular rate & rhythm and no murmurs and no lower extremity  edema ABDOMEN:abdomen soft, non-tender and normal bowel sounds Musculoskeletal:no cyanosis of digits and no clubbing  NEURO: alert & oriented x 3 with fluent speech, no focal motor/sensory deficits  LABORATORY DATA:  I have reviewed the data as listed    Latest Ref Rng & Units 09/27/2022    8:19 AM 09/19/2022   10:19 AM 09/06/2022    9:55 AM  CBC  WBC 4.0 - 10.5 K/uL 3.1  6.9  6.8   Hemoglobin 12.0 - 15.0 g/dL 8.6  9.5  8.5   Hematocrit 36.0 - 46.0 % 25.4  28.8  25.4   Platelets 150 - 400 K/uL 87  323  258         Latest Ref Rng & Units 09/27/2022    8:19 AM 09/19/2022   10:19 AM 09/06/2022    9:55 AM  CMP  Glucose 70 - 99 mg/dL 100  112  96   BUN 6 - 20 mg/dL _0 Creatinine 0.44 - 1.00 mg/dL 0.80  1.04  1.01   Sodium 135 - 145 mmol/L 135  137  138   Potassium 3.5 - 5.1 mmol/L 3.0  3.1  3.0   Chloride 98 - 111 mmol/L 98  103  101   CO2 22 - 32 mmol/L _1 Calcium 8.9 - 10.3 mg/dL 10.9  10.6  10.3   Total Protein 6.5 - 8.1 g/dL 7.8  7.6  7.5   Total Bilirubin 0.3 - 1.2 mg/dL 0.9  0.5  0.3   Alkaline Phos 38 - 126 U/L 76  87  98   AST 15 - 41 U/L 28  39  40   ALT 0 - 44 U/L _2 RADIOGRAPHIC STUDIES: I have personally reviewed the radiological images as listed and agreed with the findings in the report. No results found.    No orders of the defined types were placed in this encounter.  All questions were answered. The patient knows to call the clinic with any problems, questions or concerns. No barriers to learning was detected. The total time spent in the appointment was 30 minutes.     Truitt Merle, MD 10/15/2022   Felicity Coyer, CMA, am acting as scribe for Truitt Merle, MD.   I have reviewed  the above documentation for accuracy and completeness, and I agree with the above.

## 2022-10-16 ENCOUNTER — Telehealth: Payer: Self-pay | Admitting: Hematology

## 2022-10-16 NOTE — Telephone Encounter (Signed)
Attempted to call patient to notify of upcoming appointments. Phone disconnected. Mailing calendar.

## 2022-10-17 ENCOUNTER — Inpatient Hospital Stay: Payer: Medicaid Other

## 2022-10-17 ENCOUNTER — Other Ambulatory Visit: Payer: Medicaid Other

## 2022-10-24 ENCOUNTER — Inpatient Hospital Stay (HOSPITAL_BASED_OUTPATIENT_CLINIC_OR_DEPARTMENT_OTHER): Payer: Medicaid Other | Admitting: Physician Assistant

## 2022-10-24 ENCOUNTER — Inpatient Hospital Stay: Payer: Medicaid Other | Attending: Hematology

## 2022-10-24 ENCOUNTER — Other Ambulatory Visit: Payer: Self-pay

## 2022-10-24 ENCOUNTER — Other Ambulatory Visit: Payer: Medicaid Other

## 2022-10-24 ENCOUNTER — Encounter: Payer: Self-pay | Admitting: Hematology

## 2022-10-24 VITALS — BP 99/63 | HR 87 | Resp 16

## 2022-10-24 DIAGNOSIS — Z79899 Other long term (current) drug therapy: Secondary | ICD-10-CM | POA: Insufficient documentation

## 2022-10-24 DIAGNOSIS — E876 Hypokalemia: Secondary | ICD-10-CM | POA: Diagnosis not present

## 2022-10-24 DIAGNOSIS — R1115 Cyclical vomiting syndrome unrelated to migraine: Secondary | ICD-10-CM | POA: Diagnosis not present

## 2022-10-24 DIAGNOSIS — Z7952 Long term (current) use of systemic steroids: Secondary | ICD-10-CM | POA: Diagnosis not present

## 2022-10-24 DIAGNOSIS — C50412 Malignant neoplasm of upper-outer quadrant of left female breast: Secondary | ICD-10-CM | POA: Diagnosis not present

## 2022-10-24 DIAGNOSIS — Z171 Estrogen receptor negative status [ER-]: Secondary | ICD-10-CM | POA: Insufficient documentation

## 2022-10-24 DIAGNOSIS — G629 Polyneuropathy, unspecified: Secondary | ICD-10-CM | POA: Insufficient documentation

## 2022-10-24 DIAGNOSIS — D72829 Elevated white blood cell count, unspecified: Secondary | ICD-10-CM

## 2022-10-24 DIAGNOSIS — R112 Nausea with vomiting, unspecified: Secondary | ICD-10-CM | POA: Diagnosis not present

## 2022-10-24 DIAGNOSIS — Z5111 Encounter for antineoplastic chemotherapy: Secondary | ICD-10-CM | POA: Diagnosis present

## 2022-10-24 LAB — CMP (CANCER CENTER ONLY)
ALT: 19 U/L (ref 0–44)
AST: 28 U/L (ref 15–41)
Albumin: 3.4 g/dL — ABNORMAL LOW (ref 3.5–5.0)
Alkaline Phosphatase: 80 U/L (ref 38–126)
Anion gap: 8 (ref 5–15)
BUN: 14 mg/dL (ref 6–20)
CO2: 30 mmol/L (ref 22–32)
Calcium: 7.1 mg/dL — ABNORMAL LOW (ref 8.9–10.3)
Chloride: 100 mmol/L (ref 98–111)
Creatinine: 0.95 mg/dL (ref 0.44–1.00)
GFR, Estimated: >60 mL/min (ref >60)
Glucose, Bld: 83 mg/dL (ref 70–99)
Potassium: 2.8 mmol/L — ABNORMAL LOW (ref 3.5–5.1)
Sodium: 138 mmol/L (ref 135–145)
Total Bilirubin: 0.4 mg/dL (ref 0.3–1.2)
Total Protein: 6.1 g/dL — ABNORMAL LOW (ref 6.5–8.1)

## 2022-10-24 LAB — CBC WITH DIFFERENTIAL (CANCER CENTER ONLY)
Abs Immature Granulocytes: 1.65 10*3/uL — ABNORMAL HIGH (ref 0.00–0.07)
Basophils Absolute: 0.1 10*3/uL (ref 0.0–0.1)
Basophils Relative: 0 %
Eosinophils Absolute: 0.2 10*3/uL (ref 0.0–0.5)
Eosinophils Relative: 1 %
HCT: 29.2 % — ABNORMAL LOW (ref 36.0–46.0)
Hemoglobin: 9.5 g/dL — ABNORMAL LOW (ref 12.0–15.0)
Immature Granulocytes: 11 %
Lymphocytes Relative: 22 %
Lymphs Abs: 3.4 10*3/uL (ref 0.7–4.0)
MCH: 30.7 pg (ref 26.0–34.0)
MCHC: 32.5 g/dL (ref 30.0–36.0)
MCV: 94.5 fL (ref 80.0–100.0)
Monocytes Absolute: 1.9 10*3/uL — ABNORMAL HIGH (ref 0.1–1.0)
Monocytes Relative: 12 %
Neutro Abs: 8.4 10*3/uL — ABNORMAL HIGH (ref 1.7–7.7)
Neutrophils Relative %: 54 %
Platelet Count: 300 10*3/uL (ref 150–400)
RBC: 3.09 MIL/uL — ABNORMAL LOW (ref 3.87–5.11)
RDW: 18.2 % — ABNORMAL HIGH (ref 11.5–15.5)
Smear Review: NORMAL
WBC Count: 15.6 10*3/uL — ABNORMAL HIGH (ref 4.0–10.5)
nRBC: 0.7 % — ABNORMAL HIGH (ref 0.0–0.2)

## 2022-10-24 LAB — MAGNESIUM: Magnesium: 1 mg/dL — ABNORMAL LOW (ref 1.7–2.4)

## 2022-10-24 MED ORDER — POTASSIUM CHLORIDE 10 MEQ/100ML IV SOLN
10.0000 meq | INTRAVENOUS | Status: AC
  Administered 2022-10-24 (×3): 10 meq via INTRAVENOUS
  Filled 2022-10-24 (×3): qty 100

## 2022-10-24 MED ORDER — MAGNESIUM OXIDE -MG SUPPLEMENT 400 (240 MG) MG PO TABS: 400.0000 mg | ORAL_TABLET | Freq: Every day | ORAL | 0 refills | Status: AC

## 2022-10-24 MED ORDER — POTASSIUM CHLORIDE CRYS ER 20 MEQ PO TBCR
40.0000 meq | EXTENDED_RELEASE_TABLET | Freq: Once | ORAL | Status: AC
  Administered 2022-10-24: 40 meq via ORAL
  Filled 2022-10-24: qty 2

## 2022-10-24 MED ORDER — OYSTER SHELL CALCIUM/D3 500-5 MG-MCG PO TABS: 1.0000 | ORAL_TABLET | Freq: Two times a day (BID) | ORAL | 0 refills | Status: DC

## 2022-10-24 MED ORDER — SODIUM CHLORIDE 0.9 % IV SOLN
2.0000 g | Freq: Once | INTRAVENOUS | Status: AC
  Administered 2022-10-24: 2 g via INTRAVENOUS
  Filled 2022-10-24: qty 20

## 2022-10-24 MED ORDER — MAGNESIUM SULFATE 4 GM/100ML IV SOLN
4.0000 g | Freq: Once | INTRAVENOUS | Status: AC
  Administered 2022-10-24: 4 g via INTRAVENOUS
  Filled 2022-10-24: qty 100

## 2022-10-24 NOTE — Progress Notes (Signed)
Patient preferred PIV instead of port access due to not having cream on port site.

## 2022-10-24 NOTE — Patient Instructions (Signed)
It is important that you take your potassium, magnesium and calcium pills. They are all low today which is causing your symptoms.  Prescriptions for magnesium and calcium sent to the pharmacy for you.  Your next visit is 12/14 with Dr. Burr Medico. If your symptoms do not improve please call us to be seen sooner.

## 2022-10-24 NOTE — Patient Instructions (Signed)
Potassium Chloride Injection What is this medication? POTASSIUM CHLORIDE (poe TASS i um KLOOR ide) prevents and treats low levels of potassium in your body. Potassium plays an important role in maintaining the health of your kidneys, heart, muscles, and nervous system. This medicine may be used for other purposes; ask your health care provider or pharmacist if you have questions. COMMON BRAND NAME(S): PROAMP What should I tell my care team before I take this medication? They need to know if you have any of these conditions: Addison disease Dehydration Diabetes (high blood sugar) Heart disease High levels of potassium in the blood Irregular heartbeat or rhythm Kidney disease Large areas of burned skin An unusual or allergic reaction to potassium, other medications, foods, dyes, or preservatives Pregnant or trying to get pregnant Breast-feeding How should I use this medication? This medication is injected into a vein. It is given in a hospital or clinic setting. Talk to your care team about the use of this medication in children. Special care may be needed. Overdosage: If you think you have taken too much of this medicine contact a poison control center or emergency room at once. NOTE: This medicine is only for you. Do not share this medicine with others. What if I miss a dose? This does not apply. This medication is not for regular use. What may interact with this medication? Do not take this medication with any of the following: Certain diuretics, such as spironolactone, triamterene Eplerenone Sodium polystyrene sulfonate This medication may also interact with the following: Certain medications for blood pressure or heart disease, such as lisinopril, losartan, quinapril, valsartan Medications that lower your chance of fighting infection, such as cyclosporine, tacrolimus NSAIDs, medications for pain and inflammation, such as ibuprofen or naproxen Other potassium supplements Salt  substitutes This list may not describe all possible interactions. Give your health care provider a list of all the medicines, herbs, non-prescription drugs, or dietary supplements you use. Also tell them if you smoke, drink alcohol, or use illegal drugs. Some items may interact with your medicine. What should I watch for while using this medication? Visit your care team for regular checks on your progress. Tell your care team if your symptoms do not start to get better or if they get worse. You may need blood work while you are taking this medication. Avoid salt substitutes unless you are told otherwise by your care team. What side effects may I notice from receiving this medication? Side effects that you should report to your care team as soon as possible: Allergic reactions--skin rash, itching, hives, swelling of the face, lips, tongue, or throat High potassium level--muscle weakness, fast or irregular heartbeat Side effects that usually do not require medical attention (report to your care team if they continue or are bothersome): Diarrhea Nausea Stomach pain Vomiting This list may not describe all possible side effects. Call your doctor for medical advice about side effects. You may report side effects to FDA at 1-800-FDA-1088. Where should I keep my medication? This medication is given in a hospital or clinic. It will not be stored at home. NOTE: This sheet is a summary. It may not cover all possible information. If you have questions about this medicine, talk to your doctor, pharmacist, or health care provider.  2023 Elsevier/Gold Standard (2021-02-15 00:00:00) Magnesium Sulfate Injection What is this medication? MAGNESIUM SULFATE (mag NEE zee um SUL fate) prevents and treats low levels of magnesium in your body. It may also be used to prevent and treat seizures   during pregnancy in people with high blood pressure disorders, such as preeclampsia or eclampsia. Magnesium plays an important  role in maintaining the health of your muscles and nervous system. This medicine may be used for other purposes; ask your health care provider or pharmacist if you have questions. What should I tell my care team before I take this medication? They need to know if you have any of these conditions: Heart disease History of irregular heart beat Kidney disease An unusual or allergic reaction to magnesium sulfate, medications, foods, dyes, or preservatives Pregnant or trying to get pregnant Breast-feeding How should I use this medication? This medication is for infusion into a vein. It is given in a hospital or clinic setting. Talk to your care team about the use of this medication in children. While this medication may be prescribed for selected conditions, precautions do apply. Overdosage: If you think you have taken too much of this medicine contact a poison control center or emergency room at once. NOTE: This medicine is only for you. Do not share this medicine with others. What if I miss a dose? This does not apply. What may interact with this medication? Certain medications for anxiety or sleep Certain medications for seizures, such phenobarbital Digoxin Medications that relax muscles for surgery Narcotic medications for pain This list may not describe all possible interactions. Give your health care provider a list of all the medicines, herbs, non-prescription drugs, or dietary supplements you use. Also tell them if you smoke, drink alcohol, or use illegal drugs. Some items may interact with your medicine. What should I watch for while using this medication? Your condition will be monitored carefully while you are receiving this medication. You may need blood work done while you are receiving this medication. What side effects may I notice from receiving this medication? Side effects that you should report to your care team as soon as possible: Allergic reactions--skin rash, itching,  hives, swelling of the face, lips, tongue, or throat High magnesium level--confusion, drowsiness, facial flushing, redness, sweating, muscle weakness, fast or irregular heartbeat, trouble breathing Low blood pressure--dizziness, feeling faint or lightheaded, blurry vision Side effects that usually do not require medical attention (report to your care team if they continue or are bothersome): Headache Nausea This list may not describe all possible side effects. Call your doctor for medical advice about side effects. You may report side effects to FDA at 1-800-FDA-1088. Where should I keep my medication? This medication is given in a hospital or clinic and will not be stored at home. NOTE: This sheet is a summary. It may not cover all possible information. If you have questions about this medicine, talk to your doctor, pharmacist, or health care provider.  2023 Elsevier/Gold Standard (2013-03-12 00:00:00)   

## 2022-10-24 NOTE — Progress Notes (Signed)
Symptom Management Consult note [facility]    Patient Care Team: Trey Sailors, [place] as PCP - General (Physician Assistant) Rockwell Germany, RN as Oncology Nurse Navigator Mauro Kaufmann, RN as Oncology Nurse Navigator Rolm Bookbinder, MD as Consulting Physician (General Surgery) Truitt Merle, MD as Consulting Physician (Hematology) Kyung Rudd, MD as Consulting Physician (Radiation Oncology) Latanya Maudlin, MD as Consulting Physician (Orthopedic Surgery) Cordelia Poche as Physician Assistant (Hematology and Oncology)    Name of the patient: Gabrielle [name]  [id number]  Mar 20, 1991   Date of visit: 10/24/2022   Chief Complaint/Reason for visit: hand and feet cramping   Current Therapy: Cytoxan, Adriamycin, Keytruda  Last treatment:  Day 1   Cycle 5 on 09/19/22   ASSESSMENT & PLAN: Patient is a 31 y.o. female  with oncologic history of triple negative left breast cancer, stage IIb followed by Dr. Burr Medico.  I have viewed most recent oncology note and lab work.  #) Leukocytosis -WBC today is 15.6.  Patient is taking Decadron, suspect this is the cause for leukocytosis. -Patient is afebrile and without infectious symptoms. -Encourage patient to closely monitor temperature at home and if she has any fever or infectious symptoms to let us know.  #) Multiple electrolyte derangement -Patient symptomatic from hypocalcemia, hypokalemia, and hypomagnesemia -Hypocalcemia 7.1/corrected 7.6. Patient given 2g IV Calcium in clinic and prescription for PO x 1 week. Will add PTH on to labs. -Hypomagnesemia 1.0, patient give 4g magnesium in clinic and prescription for PO x 1 week -Hypokalemia 2.8, patient given 3 runs IV potassium and 40 mg PO here, she has prescription for PO at home. -If patient remains symptomatic next week despite IV and PO replacement she knows to schedule clinic appointment for recheck.  -Discussed treatment with Dr. Burr Medico who agrees with  plan.  #) Triple negative left breast cancer stage IIB - Next appointment with oncologist is 10/31/22. She will have labs at this appointment to recheck multiple abnormalities as listed above.   Strict ED precautions discussed should symptoms worsen.   Heme/Onc History: Oncology History Overview Note   Cancer Staging  Malignant neoplasm of upper-outer quadrant of left breast in female, estrogen receptor negative ([place]) Staging form: Breast, AJCC 8th Edition - Clinical stage from 03/11/2022: Stage IIB (cT2, cN0, cM0, G3, ER-, PR-, HER2-) - Signed by Truitt Merle, MD on 03/19/2022    Malignant neoplasm of upper-outer quadrant of left breast in female, estrogen receptor negative ([place])  03/08/2022 Mammogram   CLINICAL DATA:  31 year old female presenting for evaluation of a palpable lump in the left breast which she feels has increased in size since she first identified it. Her mother was diagnosed with breast cancer within the last 6 months. She also has family history of breast cancer in multiple aunts and her maternal grandmother.   EXAM: DIGITAL DIAGNOSTIC BILATERAL MAMMOGRAM WITH TOMOSYNTHESIS AND CAD; ULTRASOUND LEFT BREAST LIMITED  IMPRESSION: 1. There is a suspicious 3.7 cm mass in the left breast at 12 o'clock.   2.  No evidence of left axillary lymphadenopathy.   3.  No evidence of malignancy in the right breast.   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 Based on the biopsy, the carcinoma appears  Nottingham grade 3 of 3 and measures 1.2 cm in greatest linear extent.  PROGNOSTIC INDICATORS Results: The tumor cells are NEGATIVE for Her2 (0). Estrogen Receptor: 0%, NEGATIVE Progesterone Receptor: 0%, NEGATIVE Proliferation Marker Ki67: 40%   03/15/2022 Initial Diagnosis   Malignant neoplasm of upper-outer quadrant of left breast in female, estrogen receptor negative ([place])   03/28/2022 Genetic Testing   Negative hereditary cancer genetic testing: no pathogenic variants detected in [facility] BRCAPlus Panel or [facility] CustomNext-Cancer +RNAinsight Panel.  Report dates are 03/28/2022 and 03/31/2022. Marland Kitchen   The BRCAplus panel offered by [employer] and includes sequencing and deletion/duplication analysis for the following 8 genes: ATM, BRCA1, BRCA2, CDH1, CHEK2, PALB2, PTEN, and TP53.  The CustomNext-Cancer+RNAinsight panel offered by [facility] includes sequencing and rearrangement analysis for the following 47 genes:  APC, ATM, AXIN2, BARD1, BMPR1A, BRCA1, BRCA2, BRIP1, CDH1, CDK4, CDKN2A, CHEK2, DICER1, EPCAM, GREM1, HOXB13, MEN1, MLH1, MSH2, MSH3, MSH6, MUTYH, NBN, NF1, NF2, NTHL1, PALB2, PMS2, POLD1, POLE, PTEN, RAD51C, RAD51D, RECQL, RET, SDHA, SDHAF2, SDHB, SDHC, SDHD, SMAD4, SMARCA4, STK11, TP53, TSC1, TSC2, and VHL.  RNA data is routinely analyzed for use in variant interpretation for all genes.   05/14/2022 Cancer Staging   Staging form: Breast, AJCC 8th Edition - Pathologic stage from 05/14/2022: Stage IIA (pT2, pN0, cM0, G3, ER-, PR-, HER2-) - Signed by Truitt Merle, MD on 05/28/2022 Stage prefix: Initial diagnosis Histologic grading system: 3 grade system Residual tumor (R): R0 - None   06/13/2022 -  Chemotherapy   Patient is on Treatment Plan : BREAST Pembrolizumab (200) D1 + Carboplatin (1.5) D1,8,15 + Paclitaxel (80) D1,8,15 q21d X 4 cycles / Pembrolizumab (200) D1 + AC D1 q21d x 4 cycles     06/14/2022 - 07/05/2022 Chemotherapy   Patient is on Treatment Plan : BREAST Pembrolizumab  (200) D1 + Carboplatin (5) D1 + Paclitaxel (80) D1,8,15 q21d X 4 cycles / Pembrolizumab (200) D1 + AC D1 q21d x 4 cycles         Interval history-: Gabrielle [name] is a 31 y.o. female with oncologic history as above presenting to [facility] today with chief complaint of hand cramping, tingling in her feet and pain.  Patient is accompanied by her mother who provides additional history.  Patient states for the last x 5 days she has had intermittent hand cramping and tingling in her feet.  The tingling in her feet she notices mostly when sitting on the toilet.  She denies being seated for prolonged period of time when symptoms started.  If she massages her legs the tingling will resolve.  To describe her pain patient is reporting that her skin hurts and feels sore to the touch. She denies any fall, injury or trauma. She denies any wounds.  Patient reports she stopped taking her potassium x 10 days ago because she did not think she needed it anymore since her diet was improved with the decadron she was taking. She still has potassium pills at home. She has also been avoiding food with calcium because of blood work had shown high calcium recently. Patient denies any sick contacts. Denies any recent illness. Denies fever, chills, cough, shortness of breath, chest pain, weakness, abdominal pain, urinary symptoms, diarrhea, rash.      ROS  All other systems are reviewed  and are negative for acute change except as noted in the HPI.    No Known Allergies   Past Medical History:  Diagnosis Date   Arthritis    Bilateral Knees   Cancer ([place]) 03/11/2022   Breast   Family history of breast cancer 03/21/2022   Family history of prostate cancer 03/21/2022   Hypercholesteremia      Past Surgical History:  Procedure Laterality Date   AXILLARY SENTINEL NODE BIOPSY Left 05/14/2022   Procedure: LEFT AXILLARY SENTINEL NODE BIOPSY;  Surgeon: Rolm Bookbinder, MD;  Location: [facility];  Service: General;   Laterality: Left;  GEN w/ PEC BLOCK   BREAST BIOPSY Left 03/11/2022   BREAST LUMPECTOMY WITH AXILLARY LYMPH NODE BIOPSY Left 04/16/2022   Procedure: LEFT BREAST LUMPECTOMY WITH LEFT AXILLARY SENTINEL LYMPH NODE BIOPSY;  Surgeon: Rolm Bookbinder, MD;  Location: [facility];  Service: General;  Laterality: Left;   DENTAL SURGERY     PORTACATH PLACEMENT Right 04/16/2022   Procedure: INSERTION PORT-A-CATH;  Surgeon: Rolm Bookbinder, MD;  Location: [facility];  Service: General;  Laterality: Right;    Social History   Socioeconomic History   Marital status: Single    Spouse name: Not on file   Number of children: 2   Years of education: Not on file   Highest education level: Not on file  Occupational History   Not on file  Tobacco Use   Smoking status: Former    Packs/day: 0.25    Years: 3.00    Total pack years: 0.75    Types: Cigarettes    Quit date: 01/16/2022    Years since quitting: 0.7   Smokeless tobacco: Never  Vaping Use   Vaping Use: Never used  Substance and Sexual Activity   Alcohol use: Yes    Comment: social   Drug use: Not Currently    Types: Marijuana   Sexual activity: Yes    Birth control/protection: None  Other Topics Concern   Not on file  Social History Narrative   Not on file   Social Determinants of Health   Financial Resource Strain: High Risk (03/20/2022)   Overall Financial Resource Strain (CARDIA)    Difficulty of Paying Living Expenses: Very hard  Food Insecurity: Food Insecurity Present (03/20/2022)   Hunger Vital Sign    Worried About Estate manager/land agent of Food in the Last Year: Often true    Ran Out of Food in the Last Year: Sometimes true  Transportation Needs: Unmet Transportation Needs (03/20/2022)   PRAPARE - Hydrologist (Medical): Yes    Lack of Transportation (Non-Medical): No  Physical Activity: Not on file  Stress: Not on file  Social Connections: Not on file  Intimate Partner  Violence: Not on file    Family History  Problem Relation Age of Onset   Breast cancer Mother 32   Prostate cancer Father 90   Diabetes Brother    Breast cancer Maternal Aunt 63   Breast cancer Paternal Aunt        dx unknown age   Prostate cancer Paternal Uncle        dx after 66   Breast cancer Paternal Grandmother        dx after 39   Prostate cancer Paternal Grandfather        dx 9s; metastatic   ADD / ADHD Son      Current Outpatient Medications:    [START ON 10/25/2022] calcium-vitamin D (  OSCAL WITH D) 500-5 MG-MCG tablet, Take 1 tablet by mouth 2 (two) times daily for 7 days., Disp: 14 tablet, Rfl: 0   [START ON 10/25/2022] magnesium oxide (MAG-OX) 400 (240 Mg) MG tablet, Take 1 tablet (400 mg total) by mouth daily for 7 days., Disp: 7 tablet, Rfl: 0   acetaminophen (TYLENOL) 500 MG tablet, Take 1,000 mg by mouth every 6 (six) hours as needed (pain.)., Disp: , Rfl:    ALPRAZolam (XANAX) 0.5 MG tablet, Take 1 tablet (0.5 mg total) by mouth as needed for anxiety., Disp: 30 tablet, Rfl: 0   aspirin-acetaminophen-caffeine (EXCEDRIN MIGRAINE) 250-250-65 MG tablet, Take by mouth every 6 (six) hours as needed for headache., Disp: , Rfl:    dexamethasone (DECADRON) 4 MG tablet, Take 1 tablet (4 mg total) by mouth 2 (two) times daily with a meal., Disp: 15 tablet, Rfl: 0   gabapentin (NEURONTIN) 100 MG capsule, Take 1-2 capsules (100-200 mg total) by mouth 2 (two) times daily., Disp: 60 capsule, Rfl: 0   ibuprofen (ADVIL) 200 MG tablet, Take 200 mg by mouth every 8 (eight) hours as needed (pain)., Disp: , Rfl:    methocarbamol (ROBAXIN) 500 MG tablet, Take by mouth as needed., Disp: , Rfl:    mirtazapine (REMERON) 15 MG tablet, Take 1 tablet (15 mg total) by mouth at bedtime., Disp: 30 tablet, Rfl: 2   nystatin (MYCOSTATIN/NYSTOP) powder, Apply 1 Application topically 3 (three) times daily., Disp: 30 g, Rfl: 0   ondansetron (ZOFRAN) 4 MG tablet, Take 4 mg by mouth every 8 (eight) hours  as needed for nausea or vomiting., Disp: , Rfl:    pantoprazole (PROTONIX) 20 MG tablet, Take 1 tablet (20 mg total) by mouth daily., Disp: 30 tablet, Rfl: 1   potassium chloride SA (KLOR-CON M) 20 MEQ tablet, Take 1 tablet (20 mEq total) by mouth 2 (two) times daily., Disp: 60 tablet, Rfl: 0   prochlorperazine (COMPAZINE) 10 MG tablet, Take 1 tablet (10 mg total) by mouth every 6 (six) hours as needed for nausea or vomiting., Disp: 30 tablet, Rfl: 0   promethazine (PHENERGAN) 25 MG tablet, Take 1 tablet (25 mg total) by mouth every 6 (six) hours as needed for nausea or vomiting., Disp: 30 tablet, Rfl: 0   sulfamethoxazole-trimethoprim (BACTRIM DS) 800-160 MG tablet, Take 1 tablet by mouth 2 (two) times daily., Disp: 14 tablet, Rfl: 0   zolpidem (AMBIEN CR) 12.5 MG CR tablet, Take 1 tablet (12.5 mg total) by mouth at bedtime as needed for sleep., Disp: 30 tablet, Rfl: 0 No current facility-administered medications for this visit.  Facility-Administered Medications Ordered in Other Visits:    calcium gluconate 2 g in sodium chloride 0.9 % 100 mL IVPB, 2 g, Intravenous, Once, Walisiewicz, Joselinne Lawal E, PA-C, Last Rate: 120 mL/hr at 10/24/22 1424, 2 g at 10/24/22 1424   diphenhydrAMINE (BENADRYL) 50 MG/ML injection, , , ,    famotidine (PEPCID) 20-0.9 MG/50ML-% IVPB, , , ,    potassium chloride 10 mEq in 100 mL IVPB, 10 mEq, Intravenous, Q1 Hr x 3, Walisiewicz, Kahmari Herard E, PA-C, Last Rate: 100 mL/hr at 10/24/22 1452, 10 mEq at 10/24/22 1452  PHYSICAL EXAM: ECOG FS:1 - Symptomatic but completely ambulatory    Vitals:   10/24/22 1140  BP: 100/67  Pulse: 89  Resp: 18  Temp: 98.2 F (36.8 C)  TempSrc: Oral  SpO2: 100%   Physical Exam Vitals and nursing note reviewed.  Constitutional:      Appearance: She is  well-developed. She is not ill-appearing or toxic-appearing.  HENT:     Head: Normocephalic.     Nose: Nose normal.  Eyes:     Conjunctiva/sclera: Conjunctivae normal.  Neck:      Vascular: No JVD.  Cardiovascular:     Rate and Rhythm: Normal rate and regular rhythm.     Pulses: Normal pulses.     Heart sounds: Normal heart sounds.  Pulmonary:     Effort: Pulmonary effort is normal.     Breath sounds: Normal breath sounds.  Abdominal:     General: There is no distension.     Palpations: Abdomen is soft.     Tenderness: There is no abdominal tenderness. There is no guarding or rebound.     Hernia: No hernia is present.  Musculoskeletal:     Cervical back: Normal range of motion.     Right lower leg: No edema.     Left lower leg: No edema.  Skin:    General: Skin is warm and dry.     Findings: No rash.  Neurological:     Mental Status: She is oriented to person, place, and time.     Comments: Speech is clear and goal oriented, follows commands Normal strength in upper and lower extremities bilaterally including dorsiflexion and plantar flexion, strong and equal grip strength Sensation normal to light and sharp touch Normal gait and balance          LABORATORY DATA: I have reviewed the data as listed    Latest Ref Rng & Units 10/24/2022   11:14 AM 09/27/2022    8:19 AM 09/19/2022   10:19 AM  CBC  WBC 4.0 - 10.5 K/uL 15.6  3.1  6.9   Hemoglobin 12.0 - 15.0 g/dL 9.5  8.6  9.5   Hematocrit 36.0 - 46.0 % 29.2  25.4  28.8   Platelets 150 - 400 K/uL 300  87  323         Latest Ref Rng & Units 10/24/2022   11:14 AM 09/27/2022    8:19 AM 09/19/2022   10:19 AM  CMP  Glucose 70 - 99 mg/dL 83  100  112   BUN 6 - 20 mg/dL _0 Creatinine 0.44 - 1.00 mg/dL 0.95  0.80  1.04   Sodium 135 - 145 mmol/L 138  135  137   Potassium 3.5 - 5.1 mmol/L 2.8  3.0  3.1   Chloride 98 - 111 mmol/L 100  98  103   CO2 22 - 32 mmol/L _1 Calcium 8.9 - 10.3 mg/dL 7.1  10.9  10.6   Total Protein 6.5 - 8.1 g/dL 6.1  7.8  7.6   Total Bilirubin 0.3 - 1.2 mg/dL 0.4  0.9  0.5   Alkaline Phos 38 - 126 U/L 80  76  87   AST 15 - 41 U/L 28  28  39   ALT 0 - 44  U/L _2 RADIOGRAPHIC STUDIES (from last 24 hours if applicable) I have personally reviewed the radiological images as listed and agreed with the findings in the report. No results found.      Visit Diagnosis: 1. Hypocalcemia   2. Hypokalemia   3. Hypomagnesemia   4. Malignant neoplasm of upper-outer quadrant of left breast in female, estrogen receptor negative (HCC)   5. Leukocytosis, unspecified type  Orders Placed This Encounter  Procedures   PTH, intact and calcium    Standing Status:   Future    Standing Expiration Date:   10/24/2023    All questions were answered. The patient knows to call the clinic with any problems, questions or concerns. No barriers to learning was detected.  I have spent a total of 30 minutes minutes of face-to-face and non-face-to-face time, preparing to see the patient, obtaining and/or reviewing separately obtained history, performing a medically appropriate examination, counseling and educating the patient, ordering tests, documenting clinical information in the electronic health record, and care coordination (communications with other health care professionals or caregivers).    Thank you for allowing me to participate in the care of this patient.    Barrie Folk, PA-C Department of Hematology/Oncology [facility] Phone: [phone]  [phone]    10/24/2022 2:59 PM

## 2022-10-25 LAB — PTH, INTACT AND CALCIUM: PTH: 83 pg/mL — ABNORMAL HIGH (ref 15–65)

## 2022-10-29 NOTE — Progress Notes (Unsigned)
[place]   [phone] [phone]   Clinic Follow up Note   Patient Care Team: Trey Sailors, PA as PCP - General (Physician Assistant) Rockwell Germany, RN as Oncology Nurse Navigator Mauro Kaufmann, RN as Oncology Nurse Navigator Rolm Bookbinder, MD as Consulting Physician (General Surgery) Truitt Merle, MD as Consulting Physician (Hematology) Kyung Rudd, MD as Consulting Physician (Radiation Oncology) Latanya Maudlin, MD as Consulting Physician (Orthopedic Surgery) Cordelia Poche as Physician Assistant (Hematology and Oncology)  Date of Service:  10/31/2022  CHIEF COMPLAINT: f/u of left breast cancer    CURRENT THERAPY:  Adjuvant AC, q21d, starting 09/19/22 Beryle Flock, q21d, starting 06/14/22  ASSESSMENT:  Gabrielle [name] is a 31 y.o. 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 SLN biopsy on 05/14/22 was negative (0/4). -she started zoladex on 06/03/22. -she began chemotherapy with carboplatin/taxol and Keytruda on 06/14/22. Taxol switched to abraxane with C2D8 due to itching and mild numbness. 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 -she switched to [facility] with continuing [place] on 09/19/22. 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  -on garbapentin     PLAN: --she has recovered from last cycle chemo, but does not want to do chemo today due to the upcoming holidays  -lab reviewed -Discuss reducing Chemo dose on next  cycle -Lab/flush/fu and C2 AC 1/4 with dose reduction   SUMMARY OF ONCOLOGIC HISTORY: Oncology History Overview Note   Cancer Staging  Malignant neoplasm of upper-outer quadrant of left breast in female, estrogen receptor negative ([place]) Staging form: Breast, AJCC 8th Edition - Clinical stage from 03/11/2022: Stage IIB (cT2, cN0, cM0, G3, ER-, PR-, HER2-) - Signed by Truitt Merle, MD on 03/19/2022    Malignant neoplasm of upper-outer quadrant of left breast in female, estrogen receptor negative ([place])  03/08/2022 Mammogram   CLINICAL DATA:  31 year old female presenting for evaluation of a palpable lump in the left breast which she feels has increased in size since she first identified it. Her mother was diagnosed with breast cancer within the last 6 months. She also has family history of breast cancer in multiple aunts and her maternal grandmother.   EXAM: DIGITAL DIAGNOSTIC BILATERAL MAMMOGRAM WITH TOMOSYNTHESIS AND CAD; ULTRASOUND LEFT BREAST LIMITED  IMPRESSION: 1. There is a suspicious 3.7 cm mass in the left breast at 12 o'clock.   2.  No evidence of left axillary lymphadenopathy.   3.  No evidence of malignancy in the right breast.   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 Based on the biopsy, the carcinoma appears Nottingham grade 3 of 3  and measures 1.2 cm in greatest linear extent.  PROGNOSTIC INDICATORS Results: The tumor cells are NEGATIVE for Her2 (0). Estrogen Receptor: 0%,  NEGATIVE Progesterone Receptor: 0%, NEGATIVE Proliferation Marker Ki67: 40%   03/15/2022 Initial Diagnosis   Malignant neoplasm of upper-outer quadrant of left breast in female, estrogen receptor negative ([place])   03/28/2022 Genetic Testing   Negative hereditary cancer genetic testing: no pathogenic variants detected in [facility] BRCAPlus Panel or [facility] CustomNext-Cancer +RNAinsight Panel.  Report dates are 03/28/2022 and 03/31/2022. Marland Kitchen   The BRCAplus panel offered by [employer] and includes sequencing and deletion/duplication analysis for the following 8 genes: ATM, BRCA1, BRCA2, CDH1, CHEK2, PALB2, PTEN, and TP53.  The CustomNext-Cancer+RNAinsight panel offered by [facility] includes sequencing and rearrangement analysis for the following 47 genes:  APC, ATM, AXIN2, BARD1, BMPR1A, BRCA1, BRCA2, BRIP1, CDH1, CDK4, CDKN2A, CHEK2, DICER1, EPCAM, GREM1, HOXB13, MEN1, MLH1, MSH2, MSH3, MSH6, MUTYH, NBN, NF1, NF2, NTHL1, PALB2, PMS2, POLD1, POLE, PTEN, RAD51C, RAD51D, RECQL, RET, SDHA, SDHAF2, SDHB, SDHC, SDHD, SMAD4, SMARCA4, STK11, TP53, TSC1, TSC2, and VHL.  RNA data is routinely analyzed for use in variant interpretation for all genes.   05/14/2022 Cancer Staging   Staging form: Breast, AJCC 8th Edition - Pathologic stage from 05/14/2022: Stage IIA (pT2, pN0, cM0, G3, ER-, PR-, HER2-) - Signed by Truitt Merle, MD on 05/28/2022 Stage prefix: Initial diagnosis Histologic grading system: 3 grade system Residual tumor (R): R0 - None   06/13/2022 -  Chemotherapy   Patient is on Treatment Plan : BREAST Pembrolizumab (200) D1 + Carboplatin (1.5) D1,8,15 + Paclitaxel (80) D1,8,15 q21d X 4 cycles / Pembrolizumab (200) D1 + AC D1 q21d x 4 cycles     06/14/2022 - 07/05/2022 Chemotherapy   Patient is on Treatment Plan : BREAST Pembrolizumab (200) D1 + Carboplatin (5) D1 + Paclitaxel (80) D1,8,15 q21d X 4 cycles / Pembrolizumab (200) D1 + AC D1 q21d x 4 cycles        INTERVAL HISTORY:  Gabrielle [name] is here for  a follow up of  left breast cancer  She was last seen by me on 10/15/2022 She presents to the clinic alone.Pt said when she receive the IV fluids it helped her a lot.Pt states she feel better and her appetite is back, she denies nausea. Pt states she has some diarrhea 3x a day.   All other systems were reviewed with the patient and are negative.  MEDICAL HISTORY:  Past Medical History:  Diagnosis Date   Arthritis    Bilateral Knees   Cancer ([place]) 03/11/2022   Breast   Family history of breast cancer 03/21/2022   Family history of prostate cancer 03/21/2022   Hypercholesteremia     SURGICAL HISTORY: Past Surgical History:  Procedure Laterality Date   AXILLARY SENTINEL NODE BIOPSY Left 05/14/2022   Procedure: LEFT AXILLARY SENTINEL NODE BIOPSY;  Surgeon: Rolm Bookbinder, MD;  Location: [facility];  Service: General;  Laterality: Left;  GEN w/ PEC BLOCK   BREAST BIOPSY Left 03/11/2022   BREAST LUMPECTOMY WITH AXILLARY LYMPH NODE BIOPSY Left 04/16/2022   Procedure: LEFT BREAST LUMPECTOMY WITH LEFT AXILLARY SENTINEL LYMPH NODE BIOPSY;  Surgeon: Rolm Bookbinder, MD;  Location: [place];  Service: General;  Laterality: Left;   DENTAL SURGERY     PORTACATH PLACEMENT Right 04/16/2022   Procedure: INSERTION PORT-A-CATH;  Surgeon: Rolm Bookbinder, MD;  Location: [place];  Service: General;  Laterality: Right;    I have reviewed the  social history and family history with the patient and they are unchanged from previous note.  ALLERGIES:  has No Known Allergies.  MEDICATIONS:  Current Outpatient Medications  Medication Sig Dispense Refill   acetaminophen (TYLENOL) 500 MG tablet Take 1,000 mg by mouth every 6 (six) hours as needed (pain.).     ALPRAZolam (XANAX) 0.5 MG tablet Take 1 tablet (0.5 mg total) by mouth as needed for anxiety. 30 tablet 0   aspirin-acetaminophen-caffeine (EXCEDRIN MIGRAINE) 250-250-65 MG tablet Take by mouth every 6 (six) hours as  needed for headache.     calcium-vitamin D (OSCAL WITH D) 500-5 MG-MCG tablet Take 1 tablet by mouth 2 (two) times daily for 7 days. 14 tablet 0   dexamethasone (DECADRON) 4 MG tablet Take 1 tablet (4 mg total) by mouth 2 (two) times daily with a meal. 15 tablet 0   gabapentin (NEURONTIN) 100 MG capsule Take 1-2 capsules (100-200 mg total) by mouth 2 (two) times daily. 60 capsule 0   ibuprofen (ADVIL) 200 MG tablet Take 200 mg by mouth every 8 (eight) hours as needed (pain).     magnesium oxide (MAG-OX) 400 (240 Mg) MG tablet Take 1 tablet (400 mg total) by mouth daily for 7 days. 7 tablet 0   methocarbamol (ROBAXIN) 500 MG tablet Take by mouth as needed.     mirtazapine (REMERON) 15 MG tablet Take 1 tablet (15 mg total) by mouth at bedtime. 30 tablet 2   nystatin (MYCOSTATIN/NYSTOP) powder Apply 1 Application topically 3 (three) times daily. 30 g 0   ondansetron (ZOFRAN) 4 MG tablet Take 4 mg by mouth every 8 (eight) hours as needed for nausea or vomiting.     pantoprazole (PROTONIX) 20 MG tablet Take 1 tablet (20 mg total) by mouth daily. 30 tablet 1   potassium chloride SA (KLOR-CON M) 20 MEQ tablet Take 1 tablet (20 mEq total) by mouth 2 (two) times daily. 60 tablet 0   prochlorperazine (COMPAZINE) 10 MG tablet Take 1 tablet (10 mg total) by mouth every 6 (six) hours as needed for nausea or vomiting. 30 tablet 0   promethazine (PHENERGAN) 25 MG tablet Take 1 tablet (25 mg total) by mouth every 6 (six) hours as needed for nausea or vomiting. 30 tablet 0   sulfamethoxazole-trimethoprim (BACTRIM DS) 800-160 MG tablet Take 1 tablet by mouth 2 (two) times daily. 14 tablet 0   zolpidem (AMBIEN CR) 12.5 MG CR tablet Take 1 tablet (12.5 mg total) by mouth at bedtime as needed for sleep. 30 tablet 0   No current facility-administered medications for this visit.   Facility-Administered Medications Ordered in Other Visits  Medication Dose Route Frequency Provider Last Rate Last Admin   diphenhydrAMINE  (BENADRYL) 50 MG/ML injection            famotidine (PEPCID) 20-0.9 MG/50ML-% IVPB             PHYSICAL EXAMINATION: ECOG PERFORMANCE STATUS: 1 - Symptomatic but completely ambulatory  Vitals:   10/31/22 1148  BP: 117/67  Pulse: 84  Resp: 18  Temp: 98.6 F (37 C)  SpO2: 100%   Wt Readings from Last 3 Encounters:  10/31/22 274 lb 12.8 oz (124.6 kg)  09/27/22 254 lb 11.2 oz (115.5 kg)  09/19/22 268 lb 9.6 oz (121.8 kg)     GENERAL:alert, no distress and comfortable SKIN: skin color, texture, turgor are normal, no rashes or significant lesions EYES: normal, Conjunctiva are pink and non-injected, sclera clear NECK: supple, thyroid normal  size, non-tender, without nodularity LYMPH:  no palpable lymphadenopathy in the cervical, axillary  LUNGS: clear to auscultation and percussion with normal breathing effort HEART: regular rate & rhythm and no murmurs and no lower extremity edema ABDOMEN:abdomen soft, non-tender and normal bowel sounds Musculoskeletal:no cyanosis of digits and no clubbing  NEURO: alert & oriented x 3 with fluent speech, no focal motor/sensory deficits BREAST: Left Breast discoloration from surgery. No palpable mass. Breast Exam was benign  LABORATORY DATA:  I have reviewed the data as listed    Latest Ref Rng & Units 10/31/2022   11:35 AM 10/24/2022   11:14 AM 09/27/2022    8:19 AM  CBC  WBC 4.0 - 10.5 K/uL 18.4  15.6  3.1   Hemoglobin 12.0 - 15.0 g/dL 10.2  9.5  8.6   Hematocrit 36.0 - 46.0 % 32.2  29.2  25.4   Platelets 150 - 400 K/uL 228  300  87         Latest Ref Rng & Units 10/31/2022   11:35 AM 10/24/2022   11:14 AM 09/27/2022    8:19 AM  CMP  Glucose 70 - 99 mg/dL 94  83  100   BUN 6 - 20 mg/dL _0 Creatinine 0.44 - 1.00 mg/dL 0.99  0.95  0.80   Sodium 135 - 145 mmol/L 138  138  135   Potassium 3.5 - 5.1 mmol/L 4.1  2.8  3.0   Chloride 98 - 111 mmol/L 101  100  98   CO2 22 - 32 mmol/L 32  30  25   Calcium 8.9 - 10.3 mg/dL 10.2   7.1    NSERUM  10.9   Total Protein 6.5 - 8.1 g/dL 6.2  6.1  7.8   Total Bilirubin 0.3 - 1.2 mg/dL 0.4  0.4  0.9   Alkaline Phos 38 - 126 U/L 105  80  76   AST 15 - 41 U/L _1 ALT 0 - 44 U/L _2 RADIOGRAPHIC STUDIES: I have personally reviewed the radiological images as listed and agreed with the findings in the report. No results found.    Orders Placed This Encounter  Procedures   CBC with Differential ([place] Only)    Standing Status:   Future    Standing Expiration Date:   11/22/2023   CMP ([place] only)    Standing Status:   Future    Standing Expiration Date:   11/22/2023   T4    Standing Status:   Future    Standing Expiration Date:   11/22/2023   TSH    Standing Status:   Future    Standing Expiration Date:   11/22/2023   All questions were answered. The patient knows to call the clinic with any problems, questions or concerns. No barriers to learning was detected. The total time spent in the appointment was 20 minutes.     Truitt Merle, MD 10/31/2022   Felicity Coyer, CMA, am acting as scribe for Truitt Merle, MD.   I have reviewed the above documentation for accuracy and completeness, and I agree with the above.

## 2022-10-30 ENCOUNTER — Other Ambulatory Visit: Payer: Self-pay | Admitting: Hematology

## 2022-10-30 IMAGING — RF DG C-ARM 1-60 MIN
1 series · 1 of 1 positions shown · non-contrast
Comparison: None Available.

CLINICAL DATA: Fluoroscopic assistance for placement of right chest
port

EXAM:
DG C-ARM 1-60 MIN
FLUOROSCOPY:
Fluoroscopy Time:  37 seconds
Radiation Exposure Index (if provided by the fluoroscopic device):
4.5 mGy
Number of Acquired Spot Images: 1

[Series 1: run · 1 of 1 slices shown]
[im 1/1]
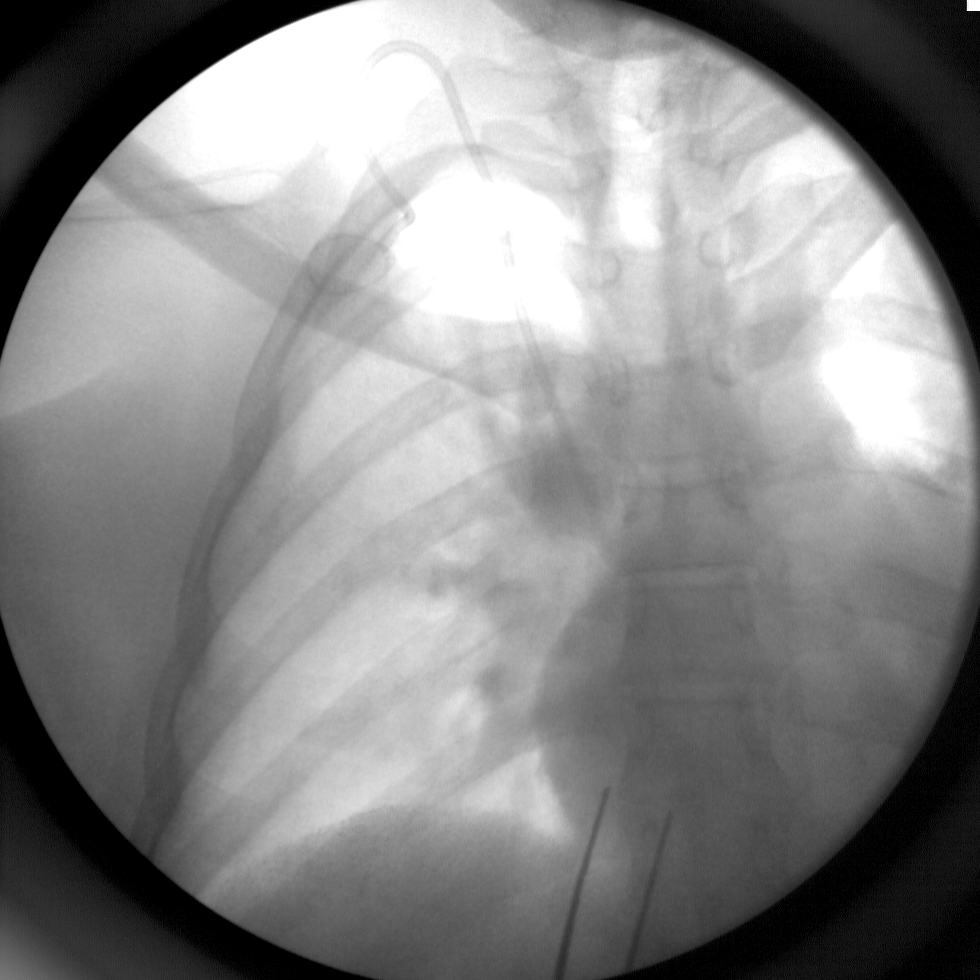

[1 of 1 positions shown; findings below may reference images not displayed]

FINDINGS: Fluoroscopic image shows placement of right IJ chest port.
IMPRESSION: Fluoroscopic assistance was provided for placement of right IJ chest
port.

## 2022-10-30 MED FILL — Fosaprepitant Dimeglumine For IV Infusion 150 MG (Base Eq): INTRAVENOUS | Qty: 5 | Status: AC

## 2022-10-30 NOTE — Assessment & Plan Note (Signed)
-  secondary to chemo -on Phenergan, compazine and zofran as needed

## 2022-10-30 NOTE — Assessment & Plan Note (Signed)
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 SLN biopsy on 05/14/22 was negative (0/4). -she started zoladex on 06/03/22. -she began chemotherapy with carboplatin/taxol and Keytruda on 06/14/22. Taxol switched to abraxane with C2D8 due to itching and mild numbness. 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 -she switched to [facility] with continuing [place] on 09/19/22. She tolerated poorly with persistent N/V, fatigue and anorexia

## 2022-10-30 NOTE — Assessment & Plan Note (Signed)
-  secondary to chemo  -on garbapentin

## 2022-10-31 ENCOUNTER — Other Ambulatory Visit: Payer: Medicaid Other

## 2022-10-31 ENCOUNTER — Ambulatory Visit: Payer: Medicaid Other

## 2022-10-31 ENCOUNTER — Ambulatory Visit: Payer: Medicaid Other | Admitting: Hematology

## 2022-10-31 ENCOUNTER — Inpatient Hospital Stay: Payer: Medicaid Other

## 2022-10-31 ENCOUNTER — Encounter: Payer: Medicaid Other | Admitting: Dietician

## 2022-10-31 ENCOUNTER — Encounter: Payer: Self-pay | Admitting: Hematology

## 2022-10-31 ENCOUNTER — Other Ambulatory Visit: Payer: Self-pay

## 2022-10-31 ENCOUNTER — Inpatient Hospital Stay (HOSPITAL_BASED_OUTPATIENT_CLINIC_OR_DEPARTMENT_OTHER): Payer: Medicaid Other | Admitting: Hematology

## 2022-10-31 VITALS — BP 117/67 | HR 84 | Temp 98.6°F | Resp 18 | Ht 70.0 in | Wt 274.8 lb

## 2022-10-31 DIAGNOSIS — Z171 Estrogen receptor negative status [ER-]: Secondary | ICD-10-CM | POA: Diagnosis not present

## 2022-10-31 DIAGNOSIS — D649 Anemia, unspecified: Secondary | ICD-10-CM

## 2022-10-31 DIAGNOSIS — C50412 Malignant neoplasm of upper-outer quadrant of left female breast: Secondary | ICD-10-CM | POA: Diagnosis not present

## 2022-10-31 DIAGNOSIS — R112 Nausea with vomiting, unspecified: Secondary | ICD-10-CM | POA: Diagnosis not present

## 2022-10-31 DIAGNOSIS — Z95828 Presence of other vascular implants and grafts: Secondary | ICD-10-CM

## 2022-10-31 DIAGNOSIS — G6289 Other specified polyneuropathies: Secondary | ICD-10-CM | POA: Diagnosis not present

## 2022-10-31 LAB — COMPREHENSIVE METABOLIC PANEL
ALT: 24 U/L (ref 0–44)
AST: 28 U/L (ref 15–41)
Albumin: 3.6 g/dL (ref 3.5–5.0)
Alkaline Phosphatase: 105 U/L (ref 38–126)
Anion gap: 5 (ref 5–15)
BUN: 16 mg/dL (ref 6–20)
CO2: 32 mmol/L (ref 22–32)
Calcium: 10.2 mg/dL (ref 8.9–10.3)
Chloride: 101 mmol/L (ref 98–111)
Creatinine, Ser: 0.99 mg/dL (ref 0.44–1.00)
GFR, Estimated: >60 mL/min (ref >60)
Glucose, Bld: 94 mg/dL (ref 70–99)
Potassium: 4.1 mmol/L (ref 3.5–5.1)
Sodium: 138 mmol/L (ref 135–145)
Total Bilirubin: 0.4 mg/dL (ref 0.3–1.2)
Total Protein: 6.2 g/dL — ABNORMAL LOW (ref 6.5–8.1)

## 2022-10-31 LAB — CBC WITH DIFFERENTIAL/PLATELET
Abs Immature Granulocytes: 1 10*3/uL — ABNORMAL HIGH (ref 0.00–0.07)
Basophils Absolute: 0.1 10*3/uL (ref 0.0–0.1)
Basophils Relative: 0 %
Eosinophils Absolute: 0.2 10*3/uL (ref 0.0–0.5)
Eosinophils Relative: 1 %
HCT: 32.2 % — ABNORMAL LOW (ref 36.0–46.0)
Hemoglobin: 10.2 g/dL — ABNORMAL LOW (ref 12.0–15.0)
Immature Granulocytes: 5 %
Lymphocytes Relative: 18 %
Lymphs Abs: 3.3 10*3/uL (ref 0.7–4.0)
MCH: 30.9 pg (ref 26.0–34.0)
MCHC: 31.7 g/dL (ref 30.0–36.0)
MCV: 97.6 fL (ref 80.0–100.0)
Monocytes Absolute: 1.1 10*3/uL — ABNORMAL HIGH (ref 0.1–1.0)
Monocytes Relative: 6 %
Neutro Abs: 12.7 10*3/uL — ABNORMAL HIGH (ref 1.7–7.7)
Neutrophils Relative %: 70 %
Platelets: 228 10*3/uL (ref 150–400)
RBC: 3.3 MIL/uL — ABNORMAL LOW (ref 3.87–5.11)
RDW: 18.6 % — ABNORMAL HIGH (ref 11.5–15.5)
WBC: 18.4 10*3/uL — ABNORMAL HIGH (ref 4.0–10.5)
nRBC: 0.2 % (ref 0.0–0.2)

## 2022-10-31 LAB — SAMPLE TO BLOOD BANK

## 2022-10-31 MED ORDER — SODIUM CHLORIDE 0.9% FLUSH
10.0000 mL | Freq: Once | INTRAVENOUS | Status: AC
  Administered 2022-10-31: 10 mL

## 2022-11-02 ENCOUNTER — Inpatient Hospital Stay: Payer: Medicaid Other

## 2022-11-02 ENCOUNTER — Ambulatory Visit: Payer: Medicaid Other

## 2022-11-04 ENCOUNTER — Encounter: Payer: Self-pay | Admitting: *Deleted

## 2022-11-04 ENCOUNTER — Other Ambulatory Visit: Payer: Medicaid Other

## 2022-11-04 ENCOUNTER — Ambulatory Visit: Payer: Medicaid Other | Admitting: Hematology

## 2022-11-04 ENCOUNTER — Ambulatory Visit: Payer: Medicaid Other

## 2022-11-06 ENCOUNTER — Ambulatory Visit: Payer: Medicaid Other

## 2022-11-07 ENCOUNTER — Other Ambulatory Visit: Payer: Self-pay

## 2022-11-07 ENCOUNTER — Other Ambulatory Visit: Payer: Medicaid Other

## 2022-11-07 ENCOUNTER — Encounter: Payer: Self-pay | Admitting: Hematology

## 2022-11-14 ENCOUNTER — Other Ambulatory Visit: Payer: Medicaid Other

## 2022-11-18 ENCOUNTER — Encounter: Payer: Self-pay | Admitting: Hematology

## 2022-11-20 ENCOUNTER — Telehealth: Payer: Self-pay

## 2022-11-20 ENCOUNTER — Other Ambulatory Visit: Payer: Self-pay | Admitting: Hematology

## 2022-11-20 MED ORDER — PANTOPRAZOLE SODIUM 20 MG PO TBEC: 20.0000 mg | DELAYED_RELEASE_TABLET | Freq: Every day | ORAL | 1 refills | Status: DC

## 2022-11-20 MED ORDER — DEXAMETHASONE 4 MG PO TABS: 4.0000 mg | ORAL_TABLET | Freq: Every day | ORAL | 0 refills | Status: DC

## 2022-11-20 MED FILL — Fosaprepitant Dimeglumine For IV Infusion 150 MG (Base Eq): INTRAVENOUS | Qty: 5 | Status: AC

## 2022-11-20 NOTE — Assessment & Plan Note (Addendum)
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 SLN biopsy on 05/14/22 was negative (0/4). -she started zoladex on 06/03/22. -she began chemotherapy with carboplatin/taxol and Keytruda on 06/14/22. Taxol switched to abraxane with C2D8 due to itching and mild numbness. 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 -she switched to [facility] with continuing [place] on 09/19/22. She tolerated poorly with persistent N/V, fatigue and anorexia. S/p 1 cycle

## 2022-11-20 NOTE — Progress Notes (Deleted)
[place]   [phone] [phone]   Clinic Follow up Note   Patient Care Team: Trey Sailors, PA as PCP - General (Physician Assistant) Rockwell Germany, RN as Oncology Nurse Navigator Mauro Kaufmann, RN as Oncology Nurse Navigator Rolm Bookbinder, MD as Consulting Physician (General Surgery) Truitt Merle, MD as Consulting Physician (Hematology) Kyung Rudd, MD as Consulting Physician (Radiation Oncology) Latanya Maudlin, MD as Consulting Physician (Orthopedic Surgery) Cordelia Poche as Physician Assistant (Hematology and Oncology)  Date of Service:  11/20/2022  CHIEF COMPLAINT: f/u of {diagnosis, copy from prior note}  CURRENT THERAPY:  {Usually copy/paste from prior note, but pay attention to if treatment changes. Or change to Surveillance if they finished treatment}  ASSESSMENT: *** Gabrielle [name] is a 32 y.o. female with   No problem-specific Assessment & Plan notes found for this encounter.  ***   PLAN: {Everything Dr. Burr Medico talks to pt about, including reviewing scans and labs. } -{proceed with ***} -{lab with/without flush and f/u when?}   SUMMARY OF ONCOLOGIC HISTORY: Oncology History Overview Note   Cancer Staging  Malignant neoplasm of upper-outer quadrant of left breast in female, estrogen receptor negative ([place]) Staging form: Breast, AJCC 8th Edition - Clinical stage from 03/11/2022: Stage IIB (cT2, cN0, cM0, G3, ER-, PR-, HER2-) - Signed by Truitt Merle, MD on 03/19/2022    Malignant neoplasm of upper-outer quadrant of left breast in female, estrogen receptor negative ([place])  03/08/2022 Mammogram   CLINICAL DATA:  32 year old female presenting for evaluation of a palpable lump in the left breast which she feels has increased in size since she first identified it. Her mother was diagnosed with breast cancer within the last 6 months. She also has family history of breast cancer in multiple aunts and her maternal  grandmother.   EXAM: DIGITAL DIAGNOSTIC BILATERAL MAMMOGRAM WITH TOMOSYNTHESIS AND CAD; ULTRASOUND LEFT BREAST LIMITED  IMPRESSION: 1. There is a suspicious 3.7 cm mass in the left breast at 12 o'clock.   2.  No evidence of left axillary lymphadenopathy.   3.  No evidence of malignancy in the right breast.   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 Based on the biopsy, the carcinoma appears Nottingham grade 3 of 3 and measures 1.2 cm in greatest linear extent.  PROGNOSTIC INDICATORS Results: The tumor cells are NEGATIVE for Her2 (0). Estrogen Receptor: 0%, NEGATIVE Progesterone Receptor: 0%, NEGATIVE Proliferation Marker Ki67: 40%   03/15/2022 Initial Diagnosis   Malignant neoplasm of upper-outer quadrant of left breast in female, estrogen receptor negative ([place])   03/28/2022 Genetic Testing   Negative hereditary cancer genetic testing: no pathogenic variants detected in [facility] BRCAPlus Panel or [facility] CustomNext-Cancer +RNAinsight Panel.  Report dates are 03/28/2022 and 03/31/2022. Marland Kitchen   The BRCAplus panel offered by [employer] and includes sequencing and deletion/duplication analysis for the following 8 genes: ATM, BRCA1, BRCA2, CDH1, CHEK2, PALB2, PTEN, and TP53.  The CustomNext-Cancer+RNAinsight panel offered by [facility] includes sequencing and rearrangement analysis for the following 47 genes:  APC, ATM,  AXIN2, BARD1, BMPR1A, BRCA1, BRCA2, BRIP1, CDH1, CDK4, CDKN2A, CHEK2, DICER1, EPCAM, GREM1,  HOXB13, MEN1, MLH1, MSH2, MSH3, MSH6, MUTYH, NBN, NF1, NF2, NTHL1, PALB2, PMS2, POLD1, POLE, PTEN, RAD51C, RAD51D, RECQL, RET, SDHA, SDHAF2, SDHB, SDHC, SDHD, SMAD4, SMARCA4, STK11, TP53, TSC1, TSC2, and VHL.  RNA data is routinely analyzed for use in variant interpretation for all genes.   05/14/2022 Cancer Staging   Staging form: Breast, AJCC 8th Edition - Pathologic stage from 05/14/2022: Stage IIA (pT2, pN0, cM0, G3, ER-, PR-, HER2-) - Signed by Truitt Merle, MD on 05/28/2022 Stage prefix: Initial diagnosis Histologic grading system: 3 grade system Residual tumor (R): R0 - None   06/13/2022 -  Chemotherapy   Patient is on Treatment Plan : BREAST Pembrolizumab (200) D1 + Carboplatin (1.5) D1,8,15 + Paclitaxel (80) D1,8,15 q21d X 4 cycles / Pembrolizumab (200) D1 + AC D1 q21d x 4 cycles     06/14/2022 - 07/05/2022 Chemotherapy   Patient is on Treatment Plan : BREAST Pembrolizumab (200) D1 + Carboplatin (5) D1 + Paclitaxel (80) D1,8,15 q21d X 4 cycles / Pembrolizumab (200) D1 + AC D1 q21d x 4 cycles        INTERVAL HISTORY: *** Gabrielle [name] is here for a follow up of {diagnosis} She was last seen by {provider} on {date} She presents to the clinic {alone/accompanied by}. {Everything the pt says, basically. How they're feeling, complaints and concerns, etc}   All other systems were reviewed with the patient and are negative.  MEDICAL HISTORY:  Past Medical History:  Diagnosis Date   Arthritis    Bilateral Knees   Cancer ([place]) 03/11/2022   Breast   Family history of breast cancer 03/21/2022   Family history of prostate cancer 03/21/2022   Hypercholesteremia     SURGICAL HISTORY: Past Surgical History:  Procedure Laterality Date   AXILLARY SENTINEL NODE BIOPSY Left 05/14/2022   Procedure: LEFT AXILLARY SENTINEL NODE BIOPSY;  Surgeon: Rolm Bookbinder, MD;  Location: [facility];  Service: General;  Laterality: Left;  GEN w/ PEC BLOCK   BREAST BIOPSY Left 03/11/2022   BREAST LUMPECTOMY WITH  AXILLARY LYMPH NODE BIOPSY Left 04/16/2022   Procedure: LEFT BREAST LUMPECTOMY WITH LEFT AXILLARY SENTINEL LYMPH NODE BIOPSY;  Surgeon: Rolm Bookbinder, MD;  Location: [facility];  Service: General;  Laterality: Left;   DENTAL SURGERY     PORTACATH PLACEMENT Right 04/16/2022   Procedure: INSERTION PORT-A-CATH;  Surgeon: Rolm Bookbinder, MD;  Location: [facility];  Service: General;  Laterality: Right;    I have reviewed the social history and family history with the patient and they are unchanged from previous note.  ALLERGIES:  has No Known Allergies.  MEDICATIONS:  Current Outpatient Medications  Medication Sig Dispense Refill   acetaminophen (TYLENOL) 500 MG tablet Take 1,000 mg by mouth every 6 (six) hours as needed (pain.).     ALPRAZolam (XANAX) 0.5 MG tablet Take 1 tablet (0.5 mg total) by mouth as needed for anxiety. 30 tablet 0   aspirin-acetaminophen-caffeine (EXCEDRIN MIGRAINE) 250-250-65 MG tablet Take by mouth every 6 (six) hours as needed for headache.     calcium-vitamin D (OSCAL WITH D) 500-5 MG-MCG tablet Take 1 tablet by mouth 2 (two) times daily for 7 days. 14 tablet 0   dexamethasone (DECADRON) 4 MG tablet Take 1 tablet (4 mg total) by mouth 2 (two) times daily with a meal. 15 tablet 0   gabapentin (NEURONTIN) 100 MG capsule Take 1-2 capsules (100-200 mg total)  by mouth 2 (two) times daily. 60 capsule 0   ibuprofen (ADVIL) 200 MG tablet Take 200 mg by mouth every 8 (eight) hours as needed (pain).     methocarbamol (ROBAXIN) 500 MG tablet Take by mouth as needed.     mirtazapine (REMERON) 15 MG tablet Take 1 tablet (15 mg total) by mouth at bedtime. 30 tablet 2   nystatin (MYCOSTATIN/NYSTOP) powder Apply 1 Application topically 3 (three) times daily. 30 g 0   ondansetron (ZOFRAN) 4 MG tablet Take 4 mg by mouth every 8 (eight) hours as needed for nausea or vomiting.     pantoprazole (PROTONIX) 20 MG tablet Take 1 tablet (20 mg total) by  mouth daily. 30 tablet 1   potassium chloride SA (KLOR-CON M) 20 MEQ tablet Take 1 tablet (20 mEq total) by mouth 2 (two) times daily. 60 tablet 0   prochlorperazine (COMPAZINE) 10 MG tablet Take 1 tablet (10 mg total) by mouth every 6 (six) hours as needed for nausea or vomiting. 30 tablet 0   promethazine (PHENERGAN) 25 MG tablet Take 1 tablet (25 mg total) by mouth every 6 (six) hours as needed for nausea or vomiting. 30 tablet 0   sulfamethoxazole-trimethoprim (BACTRIM DS) 800-160 MG tablet Take 1 tablet by mouth 2 (two) times daily. 14 tablet 0   zolpidem (AMBIEN CR) 12.5 MG CR tablet Take 1 tablet (12.5 mg total) by mouth at bedtime as needed for sleep. 30 tablet 0   No current facility-administered medications for this visit.   Facility-Administered Medications Ordered in Other Visits  Medication Dose Route Frequency Provider Last Rate Last Admin   diphenhydrAMINE (BENADRYL) 50 MG/ML injection            famotidine (PEPCID) 20-0.9 MG/50ML-% IVPB             PHYSICAL EXAMINATION: ECOG PERFORMANCE STATUS: {CHL ONC ECOG PS:[id number]}  There were no vitals filed for this visit. Wt Readings from Last 3 Encounters:  10/31/22 274 lb 12.8 oz (124.6 kg)  09/27/22 254 lb 11.2 oz (115.5 kg)  09/19/22 268 lb 9.6 oz (121.8 kg)    {Only keep what was examined. If exam not performed, can use .CEXAM } GENERAL:alert, no distress and comfortable SKIN: skin color, texture, turgor are normal, no rashes or significant lesions EYES: normal, Conjunctiva are pink and non-injected, sclera clear {OROPHARYNX:no exudate, no erythema and lips, buccal mucosa, and tongue normal}  NECK: supple, thyroid normal size, non-tender, without nodularity LYMPH:  no palpable lymphadenopathy in the cervical, axillary {or inguinal} LUNGS: clear to auscultation and percussion with normal breathing effort HEART: regular rate & rhythm and no murmurs and no lower extremity edema ABDOMEN:abdomen soft, non-tender and normal  bowel sounds Musculoskeletal:no cyanosis of digits and no clubbing  NEURO: alert & oriented x 3 with fluent speech, no focal motor/sensory deficits  LABORATORY DATA:  I have reviewed the data as listed    Latest Ref Rng & Units 10/31/2022   11:35 AM 10/24/2022   11:14 AM 09/27/2022    8:19 AM  CBC  WBC 4.0 - 10.5 K/uL 18.4  15.6  3.1   Hemoglobin 12.0 - 15.0 g/dL 10.2  9.5  8.6   Hematocrit 36.0 - 46.0 % 32.2  29.2  25.4   Platelets 150 - 400 K/uL 228  300  87         Latest Ref Rng & Units 10/31/2022   11:35 AM 10/24/2022   11:14 AM 09/27/2022    8:19 AM  CMP  Glucose 70 - 99 mg/dL 94  83  100   BUN 6 - 20 mg/dL _0 Creatinine 0.44 - 1.00 mg/dL 0.99  0.95  0.80   Sodium 135 - 145 mmol/L 138  138  135   Potassium 3.5 - 5.1 mmol/L 4.1  2.8  3.0   Chloride 98 - 111 mmol/L 101  100  98   CO2 22 - 32 mmol/L 32  30  25   Calcium 8.9 - 10.3 mg/dL 10.2  7.1    NSERUM  10.9   Total Protein 6.5 - 8.1 g/dL 6.2  6.1  7.8   Total Bilirubin 0.3 - 1.2 mg/dL 0.4  0.4  0.9   Alkaline Phos 38 - 126 U/L 105  80  76   AST 15 - 41 U/L _1 ALT 0 - 44 U/L _2 RADIOGRAPHIC STUDIES: I have personally reviewed the radiological images as listed and agreed with the findings in the report. No results found.    No orders of the defined types were placed in this encounter.  All questions were answered. The patient knows to call the clinic with any problems, questions or concerns. No barriers to learning was detected. The total time spent in the appointment was {CHL ONC TIME VISIT - [id number]}.     Baldemar Friday, CMA 11/20/2022   I, Audry Riles, CMA, am acting as scribe for Truitt Merle, MD.   {Add scribe attestation statement}

## 2022-11-20 NOTE — Telephone Encounter (Signed)
Patient called in stating she needs a refill of her acid reflux meds she is unable to eat due to the acid coming back up. She asked [place] to refill her protonix and her dexamethasone so that she can eat.

## 2022-11-20 NOTE — Progress Notes (Unsigned)
[place]   [phone] [phone]   Clinic Follow up Note   Patient Care Team: Trey Sailors, PA as PCP - General (Physician Assistant) Rockwell Germany, RN as Oncology Nurse Navigator Mauro Kaufmann, RN as Oncology Nurse Navigator Rolm Bookbinder, MD as Consulting Physician (General Surgery) Truitt Merle, MD as Consulting Physician (Hematology) Kyung Rudd, MD as Consulting Physician (Radiation Oncology) Latanya Maudlin, MD as Consulting Physician (Orthopedic Surgery) Cordelia Poche as Physician Assistant (Hematology and Oncology)  Date of Service:  11/21/2022  I connected with [name] on 11/21/2022 at  8:40 AM EST by video enabled telemedicine visit and verified that I am speaking with the correct person using two identifiers.  I discussed the limitations, risks, security and privacy concerns of performing an evaluation and management service by telephone and the availability of in person appointments. I also discussed with the patient that there may be a patient responsible charge related to this service. The patient expressed understanding and agreed to proceed.   Other persons participating in the visit and their role in the encounter:  No  Patient's location:  home Provider's location:  Office  CHIEF COMPLAINT: f/u of left breast cancer      CURRENT THERAPY:  Adjuvant AC, q21d, starting 09/19/22 Beryle Flock, q21d, starting 06/14/22   ASSESSMENT & PLAN:  YASUKO LAPAGE is a 32 y.o. 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 SLN biopsy on 05/14/22 was negative (0/4). -she started zoladex on 06/03/22. -she began chemotherapy with  carboplatin/taxol and Keytruda on 06/14/22. Taxol switched to abraxane with C2D8 due to itching and mild numbness. 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 -she switched to [facility] with continuing [place] on 09/19/22. She tolerated poorly with persistent N/V, fatigue and anorexia  -she has not recovered well from last cycle chemotherapy 2 months ago, I will cancel her rest of 3 cycles chemo AC, she is agreeable to continue Keytruda, which was restarted in 3 weeks.    Nausea with vomiting, anorexia, acid reflux -secondary to chemo -on Phenergan, compazine and zofran as needed  -I refilled Protonix.  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 SLN biopsy on 05/14/22 was negative (0/4). -she started zoladex on 06/03/22. -she began chemotherapy with carboplatin/taxol and Keytruda on 06/14/22. Taxol switched to abraxane with C2D8 due to itching and mild numbness. 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 -she switched to [facility] with continuing [place] on 09/19/22. She tolerated poorly with persistent N/V, fatigue and anorexia. S/p 1 cycle     Peripheral neuropathy -secondary  to chemo  -on garbapentin       SUMMARY OF ONCOLOGIC HISTORY: Oncology History  Overview Note   Cancer Staging  Malignant neoplasm of upper-outer quadrant of left breast in female, estrogen receptor negative ([place]) Staging form: Breast, AJCC 8th Edition - Clinical stage from 03/11/2022: Stage IIB (cT2, cN0, cM0, G3, ER-, PR-, HER2-) - Signed by Truitt Merle, MD on 03/19/2022    Malignant neoplasm of upper-outer quadrant of left breast in female, estrogen receptor negative ([place])  03/08/2022 Mammogram   CLINICAL DATA:  32 year old female presenting for evaluation of a palpable lump in the left breast which she feels has increased in size since she first identified it. Her mother was diagnosed with breast cancer within the last 6 months. She also has family history of breast cancer in multiple aunts and her maternal grandmother.   EXAM: DIGITAL DIAGNOSTIC BILATERAL MAMMOGRAM WITH TOMOSYNTHESIS AND CAD; ULTRASOUND LEFT BREAST LIMITED  IMPRESSION: 1. There is a suspicious 3.7 cm mass in the left breast at 12 o'clock.   2.  No evidence of left axillary lymphadenopathy.   3.  No evidence of malignancy in the right breast.   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 Based on the biopsy, the carcinoma appears Nottingham grade 3 of 3 and measures 1.2 cm in greatest linear extent.  PROGNOSTIC INDICATORS Results: The tumor cells are NEGATIVE for Her2 (0). Estrogen Receptor: 0%, NEGATIVE Progesterone Receptor: 0%, NEGATIVE Proliferation Marker Ki67: 40%   03/15/2022 Initial Diagnosis   Malignant  neoplasm of upper-outer quadrant of left breast in female, estrogen receptor negative ([place])   03/28/2022 Genetic Testing   Negative hereditary cancer genetic testing: no pathogenic variants detected in [facility] BRCAPlus Panel or [employer] CustomNext-Cancer +RNAinsight Panel.  Report dates are 03/28/2022 and 03/31/2022. Marland Kitchen   The BRCAplus panel offered by [employer] and includes sequencing and deletion/duplication analysis for the following 8 genes: ATM, BRCA1, BRCA2, CDH1, CHEK2, PALB2, PTEN, and TP53.  The CustomNext-Cancer+RNAinsight panel offered by [facility] includes sequencing and rearrangement analysis for the following 47 genes:  APC, ATM, AXIN2, BARD1, BMPR1A, BRCA1, BRCA2, BRIP1, CDH1, CDK4, CDKN2A, CHEK2, DICER1, EPCAM, GREM1, HOXB13, MEN1, MLH1, MSH2, MSH3, MSH6, MUTYH, NBN, NF1, NF2, NTHL1, PALB2, PMS2, POLD1, POLE, PTEN, RAD51C, RAD51D, RECQL, RET, SDHA, SDHAF2, SDHB, SDHC, SDHD, SMAD4, SMARCA4, STK11, TP53, TSC1, TSC2, and VHL.  RNA data is routinely analyzed for use in variant interpretation for all genes.   05/14/2022 Cancer Staging   Staging form: Breast, AJCC 8th Edition - Pathologic stage from 05/14/2022: Stage IIA (pT2, pN0, cM0, G3, ER-, PR-, HER2-) - Signed by Truitt Merle, MD on 05/28/2022 Stage prefix: Initial diagnosis Histologic grading system: 3 grade system Residual tumor (R): R0 - None   06/13/2022 -  Chemotherapy   Patient is on Treatment Plan : BREAST Pembrolizumab (200) D1 + Carboplatin (1.5) D1,8,15 + Paclitaxel (80) D1,8,15 q21d X 4 cycles / Pembrolizumab (200) D1 + AC D1 q21d x 4 cycles     06/14/2022 - 07/05/2022 Chemotherapy   Patient is on Treatment Plan : BREAST  Pembrolizumab (200) D1 + Carboplatin (5) D1 + Paclitaxel (80) D1,8,15 q21d X 4 cycles / Pembrolizumab (200) D1 + AC D1 q21d x 4 cycles        INTERVAL HISTORY:  AIYONNA LUCADO was contacted for a follow up of left breast cancer    . She was last seen by me on 10/31/22. Pt reports of having a a lot of acid in  her chest. She report of weakness in her legs. She states its difficult to walk. Pt report of pain under her breast where she had the lumpectomy.Pt reports she hasn't ate in two weeks.   All other systems were reviewed with the patient and are negative.  MEDICAL HISTORY:  Past Medical History:  Diagnosis Date   Arthritis    Bilateral Knees   Cancer ([place]) 03/11/2022   Breast   Family history of breast cancer 03/21/2022   Family history of prostate cancer 03/21/2022   Hypercholesteremia     SURGICAL HISTORY: Past Surgical History:  Procedure Laterality Date   AXILLARY SENTINEL NODE BIOPSY Left 05/14/2022   Procedure: LEFT AXILLARY SENTINEL NODE BIOPSY;  Surgeon: Rolm Bookbinder, MD;  Location: [place];  Service: General;  Laterality: Left;  GEN w/ PEC BLOCK   BREAST BIOPSY Left 03/11/2022   BREAST LUMPECTOMY WITH AXILLARY LYMPH NODE BIOPSY Left 04/16/2022   Procedure: LEFT BREAST LUMPECTOMY WITH LEFT AXILLARY SENTINEL LYMPH NODE BIOPSY;  Surgeon: Rolm Bookbinder, MD;  Location: [place];  Service: General;  Laterality: Left;   DENTAL SURGERY     PORTACATH PLACEMENT Right 04/16/2022   Procedure: INSERTION PORT-A-CATH;  Surgeon: Rolm Bookbinder, MD;  Location: [place];  Service: General;  Laterality: Right;    I have reviewed the social history and family history with the patient and they are unchanged from previous note.  ALLERGIES:  has No Known Allergies.  MEDICATIONS:  Current Outpatient Medications  Medication Sig Dispense Refill   acetaminophen (TYLENOL) 500 MG tablet Take 1,000 mg by mouth every 6 (six) hours as needed (pain.).     ALPRAZolam (XANAX) 0.5 MG tablet Take 1 tablet (0.5 mg total) by mouth as needed for anxiety. 30 tablet 0   aspirin-acetaminophen-caffeine (EXCEDRIN MIGRAINE) 250-250-65 MG tablet Take by mouth every 6 (six) hours as needed for headache.     calcium-vitamin D (OSCAL WITH D) 500-5 MG-MCG tablet Take 1 tablet  by mouth 2 (two) times daily for 7 days. 14 tablet 0   dexamethasone (DECADRON) 4 MG tablet Take 1 tablet (4 mg total) by mouth daily. 7 tablet 0   gabapentin (NEURONTIN) 100 MG capsule Take 1-2 capsules (100-200 mg total) by mouth 2 (two) times daily. 60 capsule 0   ibuprofen (ADVIL) 200 MG tablet Take 200 mg by mouth every 8 (eight) hours as needed (pain).     methocarbamol (ROBAXIN) 500 MG tablet Take by mouth as needed.     mirtazapine (REMERON) 15 MG tablet Take 1 tablet (15 mg total) by mouth at bedtime. 30 tablet 2   nystatin (MYCOSTATIN/NYSTOP) powder Apply 1 Application topically 3 (three) times daily. 30 g 0   ondansetron (ZOFRAN) 4 MG tablet Take 4 mg by mouth every 8 (eight) hours as needed for nausea or vomiting.     pantoprazole (PROTONIX) 20 MG tablet Take 1 tablet (20 mg total) by mouth daily. 30 tablet 1   potassium chloride SA (KLOR-CON M) 20 MEQ tablet Take 1 tablet (20 mEq total) by mouth 2 (two) times  daily. 60 tablet 0   prochlorperazine (COMPAZINE) 10 MG tablet Take 1 tablet (10 mg total) by mouth every 6 (six) hours as needed for nausea or vomiting. 30 tablet 0   promethazine (PHENERGAN) 25 MG tablet Take 1 tablet (25 mg total) by mouth every 6 (six) hours as needed for nausea or vomiting. 30 tablet 0   sulfamethoxazole-trimethoprim (BACTRIM DS) 800-160 MG tablet Take 1 tablet by mouth 2 (two) times daily. 14 tablet 0   zolpidem (AMBIEN CR) 12.5 MG CR tablet Take 1 tablet (12.5 mg total) by mouth at bedtime as needed for sleep. 30 tablet 0   No current facility-administered medications for this visit.   Facility-Administered Medications Ordered in Other Visits  Medication Dose Route Frequency Provider Last Rate Last Admin   diphenhydrAMINE (BENADRYL) 50 MG/ML injection            famotidine (PEPCID) 20-0.9 MG/50ML-% IVPB             PHYSICAL EXAMINATION: ECOG PERFORMANCE STATUS: 2 - Symptomatic, <50% confined to bed  There were no vitals filed for this visit. Wt  Readings from Last 3 Encounters:  10/31/22 274 lb 12.8 oz (124.6 kg)  09/27/22 254 lb 11.2 oz (115.5 kg)  09/19/22 268 lb 9.6 oz (121.8 kg)     No vitals taken today, Exam not performed today  LABORATORY DATA:  I have reviewed the data as listed    Latest Ref Rng & Units 10/31/2022   11:35 AM 10/24/2022   11:14 AM 09/27/2022    8:19 AM  CBC  WBC 4.0 - 10.5 K/uL 18.4  15.6  3.1   Hemoglobin 12.0 - 15.0 g/dL 10.2  9.5  8.6   Hematocrit 36.0 - 46.0 % 32.2  29.2  25.4   Platelets 150 - 400 K/uL 228  300  87         Latest Ref Rng & Units 10/31/2022   11:35 AM 10/24/2022   11:14 AM 09/27/2022    8:19 AM  CMP  Glucose 70 - 99 mg/dL 94  83  100   BUN 6 - 20 mg/dL _0 Creatinine 0.44 - 1.00 mg/dL 0.99  0.95  0.80   Sodium 135 - 145 mmol/L 138  138  135   Potassium 3.5 - 5.1 mmol/L 4.1  2.8  3.0   Chloride 98 - 111 mmol/L 101  100  98   CO2 22 - 32 mmol/L 32  30  25   Calcium 8.9 - 10.3 mg/dL 10.2  7.1    NSERUM  10.9   Total Protein 6.5 - 8.1 g/dL 6.2  6.1  7.8   Total Bilirubin 0.3 - 1.2 mg/dL 0.4  0.4  0.9   Alkaline Phos 38 - 126 U/L 105  80  76   AST 15 - 41 U/L _1 ALT 0 - 44 U/L _2 RADIOGRAPHIC STUDIES: I have personally reviewed the radiological images as listed and agreed with the findings in the report. No results found.    Orders Placed This Encounter  Procedures   Ambulatory referral to Physical Therapy    Referral Priority:   Urgent    Referral Type:   Physical Medicine    Referral Reason:   Specialty Services Required    Requested Specialty:   Physical Therapy    Number of Visits Requested:   1  All questions were answered. The patient knows to call the clinic with any problems, questions or concerns. No barriers to learning was detected. The total time spent in the appointment was 25 minutes.     Truitt Merle, MD 11/21/2022   Felicity Coyer am acting as scribe for Truitt Merle, MD.   I have reviewed the above  documentation for accuracy and completeness, and I agree with the above.

## 2022-11-20 NOTE — Assessment & Plan Note (Signed)
-  secondary to chemo  -on garbapentin

## 2022-11-21 ENCOUNTER — Encounter: Payer: Self-pay | Admitting: Physical Therapy

## 2022-11-21 ENCOUNTER — Ambulatory Visit: Payer: Medicaid Other | Admitting: Nurse Practitioner

## 2022-11-21 ENCOUNTER — Other Ambulatory Visit: Payer: Medicaid Other

## 2022-11-21 ENCOUNTER — Encounter: Payer: Self-pay | Admitting: Hematology

## 2022-11-21 ENCOUNTER — Ambulatory Visit: Payer: Medicaid Other

## 2022-11-21 ENCOUNTER — Inpatient Hospital Stay: Payer: Medicaid Other | Attending: Hematology | Admitting: Hematology

## 2022-11-21 ENCOUNTER — Other Ambulatory Visit: Payer: Self-pay | Admitting: Physician Assistant

## 2022-11-21 ENCOUNTER — Inpatient Hospital Stay: Payer: Medicaid Other

## 2022-11-21 ENCOUNTER — Inpatient Hospital Stay: Payer: Medicaid Other | Admitting: Hematology

## 2022-11-21 DIAGNOSIS — T451X5A Adverse effect of antineoplastic and immunosuppressive drugs, initial encounter: Secondary | ICD-10-CM | POA: Insufficient documentation

## 2022-11-21 DIAGNOSIS — R630 Anorexia: Secondary | ICD-10-CM | POA: Diagnosis not present

## 2022-11-21 DIAGNOSIS — K219 Gastro-esophageal reflux disease without esophagitis: Secondary | ICD-10-CM | POA: Insufficient documentation

## 2022-11-21 DIAGNOSIS — C50412 Malignant neoplasm of upper-outer quadrant of left female breast: Secondary | ICD-10-CM | POA: Diagnosis not present

## 2022-11-21 DIAGNOSIS — R112 Nausea with vomiting, unspecified: Secondary | ICD-10-CM

## 2022-11-21 DIAGNOSIS — E7800 Pure hypercholesterolemia, unspecified: Secondary | ICD-10-CM | POA: Diagnosis not present

## 2022-11-21 DIAGNOSIS — G620 Drug-induced polyneuropathy: Secondary | ICD-10-CM | POA: Insufficient documentation

## 2022-11-21 DIAGNOSIS — Z9221 Personal history of antineoplastic chemotherapy: Secondary | ICD-10-CM | POA: Diagnosis not present

## 2022-11-21 DIAGNOSIS — Z7952 Long term (current) use of systemic steroids: Secondary | ICD-10-CM | POA: Diagnosis not present

## 2022-11-21 DIAGNOSIS — Z171 Estrogen receptor negative status [ER-]: Secondary | ICD-10-CM

## 2022-11-21 DIAGNOSIS — Z79899 Other long term (current) drug therapy: Secondary | ICD-10-CM | POA: Insufficient documentation

## 2022-11-21 DIAGNOSIS — G6289 Other specified polyneuropathies: Secondary | ICD-10-CM

## 2022-11-23 ENCOUNTER — Ambulatory Visit: Payer: Medicaid Other

## 2022-11-28 ENCOUNTER — Other Ambulatory Visit: Payer: Medicaid Other

## 2022-12-11 ENCOUNTER — Other Ambulatory Visit: Payer: Medicaid Other

## 2022-12-11 NOTE — Progress Notes (Deleted)
[place]   [phone] [phone]   Clinic Follow up Note   Patient Care Team: Trey Sailors, PA as PCP - General (Physician Assistant) Rockwell Germany, RN as Oncology Nurse Navigator Mauro Kaufmann, RN as Oncology Nurse Navigator Rolm Bookbinder, MD as Consulting Physician (General Surgery) Truitt Merle, MD as Consulting Physician (Hematology) Kyung Rudd, MD as Consulting Physician (Radiation Oncology) Latanya Maudlin, MD as Consulting Physician (Orthopedic Surgery) Cordelia Poche as Physician Assistant (Hematology and Oncology)  Date of Service:  12/11/2022  CHIEF COMPLAINT: f/u of  left breast cancer      CURRENT THERAPY:  Beryle Flock   ASSESSMENT: *** Gabrielle [name] is a 32 y.o. female with   No problem-specific Assessment & Plan notes found for this encounter.  ***   PLAN: {Everything Dr. Burr Medico talks to pt about, including reviewing scans and labs. } -{proceed with ***} -{lab with/without flush and f/u when?}   SUMMARY OF ONCOLOGIC HISTORY: Oncology History Overview Note   Cancer Staging  Malignant neoplasm of upper-outer quadrant of left breast in female, estrogen receptor negative ([place]) Staging form: Breast, AJCC 8th Edition - Clinical stage from 03/11/2022: Stage IIB (cT2, cN0, cM0, G3, ER-, PR-, HER2-) - Signed by Truitt Merle, MD on 03/19/2022    Malignant neoplasm of upper-outer quadrant of left breast in female, estrogen receptor negative ([place])  03/08/2022 Mammogram   CLINICAL DATA:  32 year old female presenting for evaluation of a palpable lump in the left breast which she feels has increased in size since she first identified it. Her mother was diagnosed with breast cancer within the last 6 months. She also has family history of breast cancer in multiple aunts and her maternal grandmother.   EXAM: DIGITAL DIAGNOSTIC BILATERAL MAMMOGRAM WITH TOMOSYNTHESIS AND CAD; ULTRASOUND LEFT BREAST  LIMITED  IMPRESSION: 1. There is a suspicious 3.7 cm mass in the left breast at 12 o'clock.   2.  No evidence of left axillary lymphadenopathy.   3.  No evidence of malignancy in the right breast.   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 Based on the biopsy, the carcinoma appears Nottingham grade 3 of 3 and measures 1.2 cm in greatest linear extent.  PROGNOSTIC INDICATORS Results: The tumor cells are NEGATIVE for Her2 (0). Estrogen Receptor: 0%, NEGATIVE Progesterone Receptor: 0%, NEGATIVE Proliferation Marker Ki67: 40%   03/15/2022 Initial Diagnosis   Malignant neoplasm of upper-outer quadrant of left breast in female, estrogen receptor negative ([place])   03/28/2022 Genetic Testing   Negative hereditary cancer genetic testing: no pathogenic variants detected in [facility] BRCAPlus Panel or [facility] CustomNext-Cancer +RNAinsight Panel.  Report dates are 03/28/2022 and 03/31/2022. Marland Kitchen   The BRCAplus panel offered by [employer] and includes sequencing and deletion/duplication analysis for the following 8 genes: ATM, BRCA1, BRCA2, CDH1, CHEK2, PALB2, PTEN, and TP53.  The CustomNext-Cancer+RNAinsight panel offered by [facility] includes sequencing and rearrangement analysis for the following 47 genes:  APC, ATM, AXIN2, BARD1, BMPR1A, BRCA1, BRCA2, BRIP1, CDH1, CDK4, CDKN2A, CHEK2, DICER1, EPCAM, GREM1, HOXB13, MEN1,  MLH1, MSH2, MSH3, MSH6, MUTYH, NBN, NF1, NF2, NTHL1, PALB2, PMS2, POLD1, POLE, PTEN, RAD51C, RAD51D,  RECQL, RET, SDHA, SDHAF2, SDHB, SDHC, SDHD, SMAD4, SMARCA4, STK11, TP53, TSC1, TSC2, and VHL.  RNA data is routinely analyzed for use in variant interpretation for all genes.   05/14/2022 Cancer Staging   Staging form: Breast, AJCC 8th Edition - Pathologic stage from 05/14/2022: Stage IIA (pT2, pN0, cM0, G3, ER-, PR-, HER2-) - Signed by Truitt Merle, MD on 05/28/2022 Stage prefix: Initial diagnosis Histologic grading system: 3 grade system Residual tumor (R): R0 - None   06/13/2022 -  Chemotherapy   Patient is on Treatment Plan : BREAST Pembrolizumab (200) D1 + Carboplatin (1.5) D1,8,15 + Paclitaxel (80) D1,8,15 q21d X 4 cycles / Pembrolizumab (200) D1 + AC D1 q21d x 4 cycles     06/14/2022 - 07/05/2022 Chemotherapy   Patient is on Treatment Plan : BREAST Pembrolizumab (200) D1 + Carboplatin (5) D1 + Paclitaxel (80) D1,8,15 q21d X 4 cycles / Pembrolizumab (200) D1 + AC D1 q21d x 4 cycles        INTERVAL HISTORY: *** Gabrielle [name] is here for a follow up of  left breast cancer     She was last seen by me on 10/31/2022 She presents to the clinic     All other systems were reviewed with the patient and are negative.  MEDICAL HISTORY:  Past Medical History:  Diagnosis Date   Arthritis    Bilateral Knees   Cancer ([place]) 03/11/2022   Breast   Family history of breast cancer 03/21/2022   Family history of prostate cancer 03/21/2022   Hypercholesteremia     SURGICAL HISTORY: Past Surgical History:  Procedure Laterality Date   AXILLARY SENTINEL NODE BIOPSY Left 05/14/2022   Procedure: LEFT AXILLARY SENTINEL NODE BIOPSY;  Surgeon: Rolm Bookbinder, MD;  Location: [facility];  Service: General;  Laterality: Left;  GEN w/ PEC BLOCK   BREAST BIOPSY Left 03/11/2022   BREAST LUMPECTOMY WITH AXILLARY LYMPH NODE BIOPSY Left 04/16/2022   Procedure: LEFT BREAST LUMPECTOMY WITH LEFT AXILLARY SENTINEL LYMPH NODE BIOPSY;  Surgeon: Rolm Bookbinder, MD;  Location: [facility];  Service:  General;  Laterality: Left;   DENTAL SURGERY     PORTACATH PLACEMENT Right 04/16/2022   Procedure: INSERTION PORT-A-CATH;  Surgeon: Rolm Bookbinder, MD;  Location: [facility];  Service: General;  Laterality: Right;    I have reviewed the social history and family history with the patient and they are unchanged from previous note.  ALLERGIES:  has No Known Allergies.  MEDICATIONS:  Current Outpatient Medications  Medication Sig Dispense Refill   acetaminophen (TYLENOL) 500 MG tablet Take 1,000 mg by mouth every 6 (six) hours as needed (pain.).     ALPRAZolam (XANAX) 0.5 MG tablet Take 1 tablet (0.5 mg total) by mouth as needed for anxiety. 30 tablet 0   aspirin-acetaminophen-caffeine (EXCEDRIN MIGRAINE) 250-250-65 MG tablet Take by mouth every 6 (six) hours as needed for headache.     calcium-vitamin D (OSCAL WITH D) 500-5 MG-MCG tablet Take 1 tablet by mouth 2 (two) times daily for 7 days. 14 tablet 0   dexamethasone (DECADRON) 4 MG tablet Take 1 tablet (4 mg total) by mouth daily. 7 tablet 0   gabapentin (NEURONTIN) 100 MG capsule Take 1-2 capsules (100-200 mg total) by mouth 2 (two) times daily. 60 capsule 0   ibuprofen (ADVIL) 200 MG tablet Take 200 mg by mouth every 8 (eight) hours as  needed (pain).     methocarbamol (ROBAXIN) 500 MG tablet Take by mouth as needed.     mirtazapine (REMERON) 15 MG tablet Take 1 tablet (15 mg total) by mouth at bedtime. 30 tablet 2   nystatin (MYCOSTATIN/NYSTOP) powder Apply 1 Application topically 3 (three) times daily. 30 g 0   ondansetron (ZOFRAN) 4 MG tablet Take 4 mg by mouth every 8 (eight) hours as needed for nausea or vomiting.     pantoprazole (PROTONIX) 20 MG tablet Take 1 tablet (20 mg total) by mouth daily. 30 tablet 1   potassium chloride SA (KLOR-CON M) 20 MEQ tablet Take 1 tablet (20 mEq total) by mouth 2 (two) times daily. 60 tablet 0   prochlorperazine (COMPAZINE) 10 MG tablet Take 1 tablet (10 mg total) by mouth every 6  (six) hours as needed for nausea or vomiting. 30 tablet 0   promethazine (PHENERGAN) 25 MG tablet Take 1 tablet (25 mg total) by mouth every 6 (six) hours as needed for nausea or vomiting. 30 tablet 0   sulfamethoxazole-trimethoprim (BACTRIM DS) 800-160 MG tablet Take 1 tablet by mouth 2 (two) times daily. 14 tablet 0   zolpidem (AMBIEN CR) 12.5 MG CR tablet Take 1 tablet (12.5 mg total) by mouth at bedtime as needed for sleep. 30 tablet 0   No current facility-administered medications for this visit.   Facility-Administered Medications Ordered in Other Visits  Medication Dose Route Frequency Provider Last Rate Last Admin   diphenhydrAMINE (BENADRYL) 50 MG/ML injection            famotidine (PEPCID) 20-0.9 MG/50ML-% IVPB             PHYSICAL EXAMINATION: ECOG PERFORMANCE STATUS: {CHL ONC ECOG [phone]}  There were no vitals filed for this visit. Wt Readings from Last 3 Encounters:  10/31/22 274 lb 12.8 oz (124.6 kg)  09/27/22 254 lb 11.2 oz (115.5 kg)  09/19/22 268 lb 9.6 oz (121.8 kg)    {Only keep what was examined. If exam not performed, can use .CEXAM } GENERAL:alert, no distress and comfortable SKIN: skin color, texture, turgor are normal, no rashes or significant lesions EYES: normal, Conjunctiva are pink and non-injected, sclera clear {OROPHARYNX:no exudate, no erythema and lips, buccal mucosa, and tongue normal}  NECK: supple, thyroid normal size, non-tender, without nodularity LYMPH:  no palpable lymphadenopathy in the cervical, axillary {or inguinal} LUNGS: clear to auscultation and percussion with normal breathing effort HEART: regular rate & rhythm and no murmurs and no lower extremity edema ABDOMEN:abdomen soft, non-tender and normal bowel sounds Musculoskeletal:no cyanosis of digits and no clubbing  NEURO: alert & oriented x 3 with fluent speech, no focal motor/sensory deficits  LABORATORY DATA:  I have reviewed the data as listed    Latest Ref Rng & Units  10/31/2022   11:35 AM 10/24/2022   11:14 AM 09/27/2022    8:19 AM  CBC  WBC 4.0 - 10.5 K/uL 18.4  15.6  3.1   Hemoglobin 12.0 - 15.0 g/dL 10.2  9.5  8.6   Hematocrit 36.0 - 46.0 % 32.2  29.2  25.4   Platelets 150 - 400 K/uL 228  300  87         Latest Ref Rng & Units 10/31/2022   11:35 AM 10/24/2022   11:14 AM 09/27/2022    8:19 AM  CMP  Glucose 70 - 99 mg/dL 94  83  100   BUN 6 - 20 mg/dL 16  14  9  Creatinine 0.44 - 1.00 mg/dL 0.99  0.95  0.80   Sodium 135 - 145 mmol/L 138  138  135   Potassium 3.5 - 5.1 mmol/L 4.1  2.8  3.0   Chloride 98 - 111 mmol/L 101  100  98   CO2 22 - 32 mmol/L 32  30  25   Calcium 8.9 - 10.3 mg/dL 10.2  7.1    NSERUM  10.9   Total Protein 6.5 - 8.1 g/dL 6.2  6.1  7.8   Total Bilirubin 0.3 - 1.2 mg/dL 0.4  0.4  0.9   Alkaline Phos 38 - 126 U/L 105  80  76   AST 15 - 41 U/L '28  28  28   '$ ALT 0 - 44 U/L '24  19  14       '$ RADIOGRAPHIC STUDIES: I have personally reviewed the radiological images as listed and agreed with the findings in the report. No results found.    No orders of the defined types were placed in this encounter.  All questions were answered. The patient knows to call the clinic with any problems, questions or concerns. No barriers to learning was detected. The total time spent in the appointment was {CHL ONC TIME VISIT - [id number].     Baldemar Friday, CMA 12/11/2022   I, Audry Riles, CMA, am acting as scribe for Truitt Merle, MD.   {Add scribe attestation statement}

## 2022-12-11 NOTE — Assessment & Plan Note (Deleted)
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 SLN biopsy on 05/14/22 was negative (0/4). -she started zoladex on 06/03/22. -she began chemotherapy with carboplatin/taxol and Keytruda on 06/14/22. Taxol switched to abraxane with C2D8 due to itching and mild numbness. 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 -she switched to [facility] with continuing [facility] on 09/19/22. She tolerated poorly with persistent N/V, fatigue and anorexia, chemo stopped after first cycle AC -Will continue Keytruda every 3 weeks to complete 1 year.

## 2022-12-11 NOTE — Assessment & Plan Note (Deleted)
-  secondary to chemo  -on garbapentin

## 2022-12-12 ENCOUNTER — Telehealth: Payer: Self-pay | Admitting: Hematology

## 2022-12-12 ENCOUNTER — Inpatient Hospital Stay: Payer: Medicaid Other | Admitting: Hematology

## 2022-12-12 ENCOUNTER — Inpatient Hospital Stay: Payer: Medicaid Other

## 2022-12-12 ENCOUNTER — Encounter: Payer: Self-pay | Admitting: *Deleted

## 2022-12-12 DIAGNOSIS — G6289 Other specified polyneuropathies: Secondary | ICD-10-CM

## 2022-12-12 DIAGNOSIS — C50412 Malignant neoplasm of upper-outer quadrant of left female breast: Secondary | ICD-10-CM

## 2022-12-12 DIAGNOSIS — Z171 Estrogen receptor negative status [ER-]: Secondary | ICD-10-CM

## 2022-12-12 NOTE — Telephone Encounter (Signed)
Per 1/25 IB, called multiple times but number not in service and no answer on other number listed, will mail calendar

## 2022-12-13 ENCOUNTER — Telehealth: Payer: Self-pay | Admitting: Radiation Oncology

## 2022-12-13 NOTE — Telephone Encounter (Signed)
1/26 @ 10:19 am called patient contact # is not in service at this time.  Left message with patient's mother for patient to call our office to be schedule for consult with Dr. Lisbeth Renshaw.  Waiting on call back.

## 2022-12-14 ENCOUNTER — Ambulatory Visit: Payer: Medicaid Other

## 2022-12-17 ENCOUNTER — Encounter: Payer: Self-pay | Admitting: *Deleted

## 2022-12-27 ENCOUNTER — Other Ambulatory Visit: Payer: Self-pay

## 2022-12-27 ENCOUNTER — Other Ambulatory Visit: Payer: Medicaid Other

## 2022-12-27 ENCOUNTER — Inpatient Hospital Stay: Payer: Medicaid Other

## 2022-12-27 ENCOUNTER — Inpatient Hospital Stay: Payer: Medicaid Other | Attending: Hematology | Admitting: Hematology

## 2022-12-27 ENCOUNTER — Encounter: Payer: Self-pay | Admitting: Hematology

## 2022-12-27 VITALS — BP 104/67 | HR 104 | Temp 97.7°F | Resp 16 | Wt 249.1 lb

## 2022-12-27 DIAGNOSIS — Z9221 Personal history of antineoplastic chemotherapy: Secondary | ICD-10-CM | POA: Diagnosis not present

## 2022-12-27 DIAGNOSIS — M2550 Pain in unspecified joint: Secondary | ICD-10-CM | POA: Insufficient documentation

## 2022-12-27 DIAGNOSIS — M129 Arthropathy, unspecified: Secondary | ICD-10-CM | POA: Insufficient documentation

## 2022-12-27 DIAGNOSIS — C50412 Malignant neoplasm of upper-outer quadrant of left female breast: Secondary | ICD-10-CM | POA: Diagnosis present

## 2022-12-27 DIAGNOSIS — Z8042 Family history of malignant neoplasm of prostate: Secondary | ICD-10-CM | POA: Insufficient documentation

## 2022-12-27 DIAGNOSIS — R682 Dry mouth, unspecified: Secondary | ICD-10-CM | POA: Diagnosis not present

## 2022-12-27 DIAGNOSIS — Z95828 Presence of other vascular implants and grafts: Secondary | ICD-10-CM

## 2022-12-27 DIAGNOSIS — R630 Anorexia: Secondary | ICD-10-CM

## 2022-12-27 DIAGNOSIS — E7800 Pure hypercholesterolemia, unspecified: Secondary | ICD-10-CM | POA: Insufficient documentation

## 2022-12-27 DIAGNOSIS — Z79899 Other long term (current) drug therapy: Secondary | ICD-10-CM | POA: Diagnosis not present

## 2022-12-27 DIAGNOSIS — Z171 Estrogen receptor negative status [ER-]: Secondary | ICD-10-CM | POA: Diagnosis not present

## 2022-12-27 DIAGNOSIS — Z803 Family history of malignant neoplasm of breast: Secondary | ICD-10-CM | POA: Diagnosis not present

## 2022-12-27 DIAGNOSIS — R5383 Other fatigue: Secondary | ICD-10-CM | POA: Diagnosis not present

## 2022-12-27 LAB — CBC WITH DIFFERENTIAL/PLATELET
Abs Immature Granulocytes: 0.03 10*3/uL (ref 0.00–0.07)
Basophils Absolute: 0 10*3/uL (ref 0.0–0.1)
Basophils Relative: 0 %
Eosinophils Absolute: 0.3 10*3/uL (ref 0.0–0.5)
Eosinophils Relative: 4 %
HCT: 38.6 % (ref 36.0–46.0)
Hemoglobin: 13 g/dL (ref 12.0–15.0)
Immature Granulocytes: 1 %
Lymphocytes Relative: 50 %
Lymphs Abs: 3 10*3/uL (ref 0.7–4.0)
MCH: 27.8 pg (ref 26.0–34.0)
MCHC: 33.7 g/dL (ref 30.0–36.0)
MCV: 82.5 fL (ref 80.0–100.0)
Monocytes Absolute: 0.4 10*3/uL (ref 0.1–1.0)
Monocytes Relative: 6 %
Neutro Abs: 2.3 10*3/uL (ref 1.7–7.7)
Neutrophils Relative %: 39 %
Platelets: 260 10*3/uL (ref 150–400)
RBC: 4.68 MIL/uL (ref 3.87–5.11)
RDW: 15 % (ref 11.5–15.5)
WBC: 6 10*3/uL (ref 4.0–10.5)
nRBC: 0 % (ref 0.0–0.2)

## 2022-12-27 LAB — COMPREHENSIVE METABOLIC PANEL
ALT: 11 U/L (ref 0–44)
AST: 28 U/L (ref 15–41)
Albumin: 4 g/dL (ref 3.5–5.0)
Alkaline Phosphatase: 66 U/L (ref 38–126)
Anion gap: 6 (ref 5–15)
BUN: 11 mg/dL (ref 6–20)
CO2: 29 mmol/L (ref 22–32)
Calcium: 11.5 mg/dL — ABNORMAL HIGH (ref 8.9–10.3)
Chloride: 101 mmol/L (ref 98–111)
Creatinine, Ser: 0.92 mg/dL (ref 0.44–1.00)
GFR, Estimated: >60 mL/min (ref >60)
Glucose, Bld: 106 mg/dL — ABNORMAL HIGH (ref 70–99)
Potassium: 3.4 mmol/L — ABNORMAL LOW (ref 3.5–5.1)
Sodium: 136 mmol/L (ref 135–145)
Total Bilirubin: 0.7 mg/dL (ref 0.3–1.2)
Total Protein: 7.2 g/dL (ref 6.5–8.1)

## 2022-12-27 LAB — TSH: TSH: 1.937 u[IU]/mL (ref 0.350–4.500)

## 2022-12-27 LAB — PREGNANCY, URINE: Preg Test, Ur: NEGATIVE

## 2022-12-27 MED ORDER — SODIUM CHLORIDE 0.9% FLUSH: 10.0000 mL | Freq: Once | INTRAVENOUS | Status: DC

## 2022-12-27 MED ORDER — ZOLPIDEM TARTRATE 5 MG PO TABS: 5.0000 mg | ORAL_TABLET | Freq: Every evening | ORAL | 0 refills | Status: DC | PRN

## 2022-12-27 MED ORDER — POTASSIUM CHLORIDE CRYS ER 20 MEQ PO TBCR: 20.0000 meq | EXTENDED_RELEASE_TABLET | Freq: Every day | ORAL | 0 refills | Status: DC

## 2022-12-27 MED ORDER — MIRTAZAPINE 15 MG PO TABS: 15.0000 mg | ORAL_TABLET | Freq: Every day | ORAL | 2 refills | Status: DC

## 2022-12-27 MED ORDER — PANTOPRAZOLE SODIUM 20 MG PO TBEC: 20.0000 mg | DELAYED_RELEASE_TABLET | Freq: Every day | ORAL | 1 refills | Status: DC

## 2022-12-27 MED ORDER — HEPARIN SOD (PORK) LOCK FLUSH 100 UNIT/ML IV SOLN: 250.0000 [IU] | Freq: Once | INTRAVENOUS | Status: DC

## 2022-12-27 NOTE — Progress Notes (Signed)
[place]   [phone] [phone]   Clinic Follow up Note   Patient Care Team: Trey Sailors, PA as PCP - General (Physician Assistant) Rockwell Germany, RN as Oncology Nurse Navigator Mauro Kaufmann, RN as Oncology Nurse Navigator Rolm Bookbinder, MD as Consulting Physician (General Surgery) Truitt Merle, MD as Consulting Physician (Hematology) Kyung Rudd, MD as Consulting Physician (Radiation Oncology) Latanya Maudlin, MD as Consulting Physician (Orthopedic Surgery) Cordelia Poche as Physician Assistant (Hematology and Oncology)  Date of Service:  12/27/2022  CHIEF COMPLAINT: f/u of left breast cancer      CURRENT THERAPY:  Beryle Flock  ASSESSMENT:  Gabrielle [name] is a 32 y.o. 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 SLN biopsy on 05/14/22 was negative (0/4). -she started zoladex on 06/03/22. -she began chemotherapy with carboplatin/taxol and Keytruda on 06/14/22. Taxol switched to abraxane with C2D8 due to itching and mild numbness. 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 -she switched to [facility] with continuing [place] on 09/19/22. She tolerated poorly with persistent N/V, fatigue and anorexia, so treatment was stopped after one cycle, I encourage her to continue Keytruda for one year -She has recently developed diffuse arthralgia, and mass, which has significantly impacted her daily activities, this could be related to immunotherapy Keytruda.  I will stop her Beryle Flock  -Due to her persistent fatigue, low appetite and weight loss, obtain CT chest, abdomen pelvis with contrast, and a  bone scan to rule out cancer recurrence.  2.  Diffuse arthralgia and a dry mouth -Started about 3 weeks ago, her arthralgia has significantly impacted her daily activities.  No significant joint edema exam today. -Possible related to [facility], which I will stop. -Will refer her to rheumatology -I encouraged her to use NSAIDs 2-3 times a day, for symptom management.  3.  Fatigue, low appetite and weight loss, insomnia  -This has persisted even she has been off chemo for 3 months  -We discussed nutritional supplement -I encouraged her to restart using mirtazapine -I reviewed Ambien, but reduce the dose to 5 mg as needed  PLAN: -I refill Ambien - Referral to Rheumatologist -stop Keytruda due to dry mouth, joint pain,fatigue - I refill Mirtazapine -Order CT scan and bone scan to be done in 2-3 weeks  -f/u in 3 weeks    SUMMARY OF ONCOLOGIC HISTORY: Oncology History Overview Note   Cancer Staging  Malignant neoplasm of upper-outer quadrant of left breast in female, estrogen receptor negative ([place]) Staging form: Breast, AJCC 8th Edition - Clinical stage from 03/11/2022: Stage IIB (cT2, cN0, cM0, G3, ER-, PR-, HER2-) - Signed by Truitt Merle, MD on 03/19/2022    Malignant neoplasm of upper-outer quadrant of left breast in female, estrogen receptor negative ([place])  03/08/2022 Mammogram   CLINICAL DATA:  32 year old female presenting for evaluation of a palpable lump in the left breast which she feels has increased in size since she first identified it. Her mother was diagnosed with breast cancer within the last 6 months. She also has family history of breast cancer in multiple aunts and her maternal grandmother.   EXAM: DIGITAL DIAGNOSTIC BILATERAL MAMMOGRAM WITH TOMOSYNTHESIS AND CAD; ULTRASOUND LEFT BREAST  LIMITED  IMPRESSION: 1. There is a suspicious 3.7 cm mass in the left breast at 12 o'clock.   2.  No evidence of left axillary lymphadenopathy.   3.  No evidence of malignancy  in the right breast.   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 Based on the biopsy, the carcinoma appears Nottingham grade 3 of 3 and measures 1.2 cm in greatest linear extent.  PROGNOSTIC INDICATORS Results: The tumor cells are NEGATIVE for Her2 (0). Estrogen Receptor: 0%, NEGATIVE Progesterone Receptor: 0%, NEGATIVE Proliferation Marker Ki67: 40%   03/15/2022 Initial Diagnosis   Malignant neoplasm of upper-outer quadrant of left breast in female, estrogen receptor negative ([place])   03/28/2022 Genetic Testing   Negative hereditary cancer genetic testing: no pathogenic variants detected in [facility] BRCAPlus Panel or [employer] CustomNext-Cancer +RNAinsight Panel.  Report dates are 03/28/2022 and 03/31/2022. Marland Kitchen   The BRCAplus panel offered by [employer] and includes sequencing and deletion/duplication analysis for the following 8 genes: ATM, BRCA1, BRCA2, CDH1, CHEK2, PALB2, PTEN, and TP53.  The CustomNext-Cancer+RNAinsight panel offered by [facility] includes sequencing and rearrangement analysis for the following 47 genes:  APC, ATM, AXIN2, BARD1, BMPR1A, BRCA1, BRCA2, BRIP1, CDH1, CDK4, CDKN2A, CHEK2, DICER1, EPCAM, GREM1, HOXB13, MEN1, MLH1, MSH2, MSH3, MSH6, MUTYH, NBN, NF1, NF2, NTHL1, PALB2, PMS2, POLD1, POLE, PTEN, RAD51C, RAD51D, RECQL, RET, SDHA, SDHAF2, SDHB, SDHC, SDHD, SMAD4, SMARCA4, STK11, TP53, TSC1, TSC2, and VHL.  RNA data is routinely analyzed for use in variant interpretation for all genes.    05/14/2022 Cancer Staging   Staging form: Breast, AJCC 8th Edition - Pathologic stage from 05/14/2022: Stage IIA (pT2, pN0, cM0, G3, ER-, PR-, HER2-) - Signed by Truitt Merle, MD on 05/28/2022 Stage prefix: Initial diagnosis Histologic grading system: 3 grade system Residual tumor (R): R0 - None   06/13/2022 -  Chemotherapy   Patient is on Treatment Plan : BREAST Pembrolizumab (200) D1 + Carboplatin (1.5) D1,8,15 + Paclitaxel (80) D1,8,15 q21d X 4 cycles / Pembrolizumab (200) D1 + AC D1 q21d x 4 cycles     06/14/2022 - 07/05/2022 Chemotherapy   Patient is on Treatment Plan : BREAST Pembrolizumab (200) D1 + Carboplatin (5) D1 + Paclitaxel (80) D1,8,15 q21d X 4 cycles / Pembrolizumab (200) D1 + AC D1 q21d x 4 cycles        INTERVAL HISTORY:  Gabrielle [name] is here for a follow up of  left breast cancer    She was last seen by me on 10/31/2022 She presents to the clinic alone. Pt states she really doesn't have an appetite. Pt states that her bones hurt really bad and it hurts to get up. She states she has been taking Tylenol and Fish Oil. Pt reports of feet swelling. Pt state she has been feeling like this for the past 3 weeks. Pt states she doesn't sleep well at night. She was taking the Ambien but she ran out. Pt reports BM is fine. Pt reports she feels tired due to not sleeping well. Pt states she walk for exercise when she  can. Pt state its hard to do       All other systems were reviewed with the patient and are negative.  MEDICAL HISTORY:  Past Medical History:  Diagnosis Date   Arthritis    Bilateral Knees   Cancer ([place]) 03/11/2022   Breast   Family history of breast cancer 03/21/2022   Family history of prostate cancer 03/21/2022   Hypercholesteremia     SURGICAL HISTORY: Past Surgical History:  Procedure Laterality Date   AXILLARY SENTINEL NODE BIOPSY Left 05/14/2022   Procedure: LEFT AXILLARY SENTINEL NODE BIOPSY;  Surgeon: Rolm Bookbinder, MD;  Location: [place];  Service:  General;  Laterality: Left;  GEN w/ PEC BLOCK   BREAST BIOPSY Left 03/11/2022   BREAST LUMPECTOMY WITH AXILLARY LYMPH NODE BIOPSY Left 04/16/2022   Procedure: LEFT BREAST LUMPECTOMY WITH LEFT AXILLARY SENTINEL LYMPH NODE BIOPSY;  Surgeon: Rolm Bookbinder, MD;  Location: [place];  Service: General;  Laterality: Left;   DENTAL SURGERY     PORTACATH PLACEMENT Right 04/16/2022   Procedure: INSERTION PORT-A-CATH;  Surgeon: Rolm Bookbinder, MD;  Location: [place];  Service: General;  Laterality: Right;    I have reviewed the social history and family history with the patient and they are unchanged from previous note.  ALLERGIES:  has No Known Allergies.  MEDICATIONS:  Current Outpatient Medications  Medication Sig Dispense Refill   zolpidem (AMBIEN) 5 MG tablet Take 1 tablet (5 mg total) by mouth at bedtime as needed for sleep. 30 tablet 0   acetaminophen (TYLENOL) 500 MG tablet Take 1,000 mg by mouth every 6 (six) hours as needed (pain.).     ALPRAZolam (XANAX) 0.5 MG tablet Take 1 tablet (0.5 mg total) by mouth as needed for anxiety. 30 tablet 0   aspirin-acetaminophen-caffeine (EXCEDRIN MIGRAINE) 250-250-65 MG tablet Take by mouth every 6 (six) hours as needed for headache.     calcium-vitamin D (OSCAL WITH D) 500-5 MG-MCG tablet Take 1 tablet by mouth 2 (two) times daily for 7 days. 14 tablet 0   gabapentin (NEURONTIN) 100 MG capsule Take 1-2 capsules (100-200 mg total) by mouth 2 (two) times daily. 60 capsule 0   ibuprofen (ADVIL) 200 MG tablet Take 200 mg by mouth every 8 (eight) hours as needed (pain).     methocarbamol (ROBAXIN) 500 MG tablet Take by mouth as needed.     mirtazapine (REMERON) 15 MG tablet Take 1 tablet (15 mg total) by mouth at bedtime. 30 tablet 2   ondansetron (ZOFRAN) 4 MG tablet Take 4 mg by mouth every 8 (eight) hours as needed for nausea or vomiting.     pantoprazole (PROTONIX) 20 MG tablet Take 1 tablet (20 mg total) by mouth  daily. 30 tablet 1   potassium chloride SA (KLOR-CON M) 20 MEQ tablet Take 1 tablet (20 mEq total) by mouth daily. 30 tablet 0   prochlorperazine (COMPAZINE) 10 MG tablet Take 1 tablet (10 mg total) by mouth every 6 (six) hours as needed for nausea or vomiting. 30 tablet 0   promethazine (PHENERGAN) 25 MG tablet Take 1 tablet (25 mg total) by mouth every 6 (six) hours as needed for nausea or vomiting. 30 tablet 0   No current facility-administered medications for this visit.   Facility-Administered Medications Ordered in Other Visits  Medication Dose Route Frequency Provider Last Rate Last Admin   diphenhydrAMINE (BENADRYL) 50 MG/ML injection            famotidine (PEPCID) 20-0.9  MG/50ML-% IVPB             PHYSICAL EXAMINATION: ECOG PERFORMANCE STATUS: 2 - Symptomatic, <50% confined to bed  Vitals:   12/27/22 1351  BP: 104/67  Pulse: (!) 104  Resp: 16  Temp: 97.7 F (36.5 C)  SpO2: 100%   Wt Readings from Last 3 Encounters:  12/27/22 249 lb 1.6 oz (113 kg)  10/31/22 274 lb 12.8 oz (124.6 kg)  09/27/22 254 lb 11.2 oz (115.5 kg)     NECK: supple, thyroid normal size, non-tender, without nodularity LYMPH: (-) no palpable lymphadenopathy in the cervical, axillary  LUNGS: clear to auscultation and percussion with normal breathing effort HEART: regular rate & rhythm and no murmurs and no lower extremity edema ABDOMEN:(-) abdomen soft, tender and normal bowel sounds    LABORATORY DATA:  I have reviewed the data as listed    Latest Ref Rng & Units 12/27/2022    1:38 PM 10/31/2022   11:35 AM 10/24/2022   11:14 AM  CBC  WBC 4.0 - 10.5 K/uL 6.0  18.4  15.6   Hemoglobin 12.0 - 15.0 g/dL 13.0  10.2  9.5   Hematocrit 36.0 - 46.0 % 38.6  32.2  29.2   Platelets 150 - 400 K/uL 260  228  300         Latest Ref Rng & Units 12/27/2022    1:38 PM 10/31/2022   11:35 AM 10/24/2022   11:14 AM  CMP  Glucose 70 - 99 mg/dL 106  94  83   BUN 6 - 20 mg/dL 11  16  14   $ Creatinine 0.44 -  1.00 mg/dL 0.92  0.99  0.95   Sodium 135 - 145 mmol/L 136  138  138   Potassium 3.5 - 5.1 mmol/L 3.4  4.1  2.8   Chloride 98 - 111 mmol/L 101  101  100   CO2 22 - 32 mmol/L 29  32  30   Calcium 8.9 - 10.3 mg/dL 11.5  10.2  7.1    NSERUM   Total Protein 6.5 - 8.1 g/dL 7.2  6.2  6.1   Total Bilirubin 0.3 - 1.2 mg/dL 0.7  0.4  0.4   Alkaline Phos 38 - 126 U/L 66  105  80   AST 15 - 41 U/L 28  28  28   $ ALT 0 - 44 U/L 11  24  19       $ RADIOGRAPHIC STUDIES: I have personally reviewed the radiological images as listed and agreed with the findings in the report. No results found.    Orders Placed This Encounter  Procedures   CT CHEST ABDOMEN PELVIS W CONTRAST    Standing Status:   Future    Standing Expiration Date:   12/28/2023    Order Specific Question:   Is patient pregnant?    Answer:   No    Order Specific Question:   Preferred imaging location?    Answer:   [facility]    Order Specific Question:   Is Oral Contrast requested for this exam?    Answer:   Yes, Per Radiology protocol   NM Bone Scan Whole Body    Standing Status:   Future    Standing Expiration Date:   12/27/2023    Order Specific Question:   If indicated for the ordered procedure, I authorize the administration of a radiopharmaceutical per Radiology protocol    Answer:   Yes    Order  Specific Question:   Is the patient pregnant?    Answer:   No    Order Specific Question:   Preferred imaging location?    Answer:   [facility]   Ambulatory referral to Rheumatology    Referral Priority:   Routine    Referral Type:   Consultation    Referral Reason:   Specialty Services Required    Requested Specialty:   Rheumatology    Number of Visits Requested:   1   All questions were answered. The patient knows to call the clinic with any problems, questions or concerns. No barriers to learning was detected. The total time spent in the appointment was 30 minutes.     Truitt Merle, MD 12/27/2022   Felicity Coyer, CMA, am acting as scribe for Truitt Merle, MD.   I have reviewed the above documentation for accuracy and completeness, and I agree with the above.

## 2022-12-27 NOTE — Assessment & Plan Note (Signed)
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 SLN biopsy on 05/14/22 was negative (0/4). -she started zoladex on 06/03/22. -she began chemotherapy with carboplatin/taxol and Keytruda on 06/14/22. Taxol switched to abraxane with C2D8 due to itching and mild numbness. 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 -she switched to [facility] with continuing [place] on 09/19/22. She tolerated poorly with persistent N/V, fatigue and anorexia, so treatment was stopped after one cycle, I encourage her to continue [place] for one year

## 2022-12-30 NOTE — Progress Notes (Incomplete)
Radiation Oncology         (336) [phone] ________________________________  Name: Gabrielle [name]        MRN: [id number]  Date of Service: 12/31/2022 DOB: 08/07/1991  [id number], Maud Deed, PA  Truitt Merle, MD     REFERRING PHYSICIAN: Truitt Merle, MD   DIAGNOSIS: The encounter diagnosis was Malignant neoplasm of upper-outer quadrant of left breast in female, estrogen receptor negative ([place]).   HISTORY OF PRESENT ILLNESS: Gabrielle [name] is a 32 y.o. female originally seen in the multidisciplinary breast clinic for a new diagnosis of left breast cancer. The patient was noted to have a palpable mass in the left breast.  Further diagnostic work-up showed a mass in the 12 o'clock position measuring up to 3.7 cm.  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    Since her last visit, she underwent a left lumpectomy with sentinel lymph node biopsy on 04/16/2022 with Dr. Donne Hazel.  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  Her additional margin specimens were all negative, her left axillary sentinel lymph node specimen was benign fibroadipose tissue without nodal tissue within the specimen.  She returned for an additional sentinel lymph node excision on 05/14/2022 and 4 sampled lymph nodes were all negative for metastatic disease. She began systemic chemotherapy on 06/14/23 and completed chemo on 09/19/22. She is now receiving single agent consolidative Keytruda. She is seen today to discuss treatment recommendations regarding radiotherapy.   PREVIOUS RADIATION THERAPY: No   PAST MEDICAL HISTORY:  Past Medical History:  Diagnosis Date   Arthritis    Bilateral Knees   Cancer ([place]) 03/11/2022   Breast   Family history of breast cancer 03/21/2022   Family history of prostate cancer 03/21/2022    Hypercholesteremia        PAST SURGICAL HISTORY: Past Surgical History:  Procedure Laterality Date   AXILLARY SENTINEL NODE BIOPSY Left 05/14/2022   Procedure: LEFT AXILLARY SENTINEL NODE BIOPSY;  Surgeon: Rolm Bookbinder, MD;  Location: [facility];  Service: General;  Laterality: Left;  GEN w/ PEC BLOCK   BREAST BIOPSY Left 03/11/2022   BREAST LUMPECTOMY WITH AXILLARY LYMPH NODE BIOPSY Left 04/16/2022   Procedure: LEFT BREAST LUMPECTOMY WITH LEFT AXILLARY SENTINEL LYMPH NODE BIOPSY;  Surgeon: Rolm Bookbinder, MD;  Location: [facility];  Service: General;  Laterality: Left;   DENTAL SURGERY     PORTACATH PLACEMENT Right 04/16/2022   Procedure: INSERTION PORT-A-CATH;  Surgeon: Rolm Bookbinder, MD;  Location: [facility];  Service: General;  Laterality: Right;     FAMILY HISTORY:  Family History  Problem Relation Age of Onset   Breast cancer Mother 75   Prostate cancer Father 6   Diabetes Brother    Breast cancer Maternal Aunt 63   Breast cancer Paternal Aunt        dx unknown age   Prostate cancer Paternal Uncle        dx after 61   Breast cancer Paternal Grandmother        dx after 65   Prostate cancer Paternal Grandfather        dx 12s; metastatic   ADD / ADHD Son      SOCIAL HISTORY:  reports that she quit smoking about a year ago. Her smoking use included cigarettes. She has a 0.75 pack-year  smoking history. She has never used smokeless tobacco. She reports current alcohol use. She reports that she does not currently use drugs after having used the following drugs: Marijuana. The patient is single and lives in [place]. She is unemployed and living with her sister. She has a 36 year old son whom she has custody of, and her daughter who is 49 lives with her father.    ALLERGIES: Patient has no known allergies.   MEDICATIONS:  Current Outpatient Medications  Medication Sig Dispense Refill   acetaminophen (TYLENOL) 500 MG tablet Take 1,000  mg by mouth every 6 (six) hours as needed (pain.).     ALPRAZolam (XANAX) 0.5 MG tablet Take 1 tablet (0.5 mg total) by mouth as needed for anxiety. 30 tablet 0   aspirin-acetaminophen-caffeine (EXCEDRIN MIGRAINE) 250-250-65 MG tablet Take by mouth every 6 (six) hours as needed for headache.     calcium-vitamin D (OSCAL WITH D) 500-5 MG-MCG tablet Take 1 tablet by mouth 2 (two) times daily for 7 days. 14 tablet 0   gabapentin (NEURONTIN) 100 MG capsule Take 1-2 capsules (100-200 mg total) by mouth 2 (two) times daily. 60 capsule 0   ibuprofen (ADVIL) 200 MG tablet Take 200 mg by mouth every 8 (eight) hours as needed (pain).     methocarbamol (ROBAXIN) 500 MG tablet Take by mouth as needed.     mirtazapine (REMERON) 15 MG tablet Take 1 tablet (15 mg total) by mouth at bedtime. 30 tablet 2   ondansetron (ZOFRAN) 4 MG tablet Take 4 mg by mouth every 8 (eight) hours as needed for nausea or vomiting.     pantoprazole (PROTONIX) 20 MG tablet Take 1 tablet (20 mg total) by mouth daily. 30 tablet 1   potassium chloride SA (KLOR-CON M) 20 MEQ tablet Take 1 tablet (20 mEq total) by mouth daily. 30 tablet 0   prochlorperazine (COMPAZINE) 10 MG tablet Take 1 tablet (10 mg total) by mouth every 6 (six) hours as needed for nausea or vomiting. 30 tablet 0   promethazine (PHENERGAN) 25 MG tablet Take 1 tablet (25 mg total) by mouth every 6 (six) hours as needed for nausea or vomiting. 30 tablet 0   zolpidem (AMBIEN) 5 MG tablet Take 1 tablet (5 mg total) by mouth at bedtime as needed for sleep. 30 tablet 0   No current facility-administered medications for this visit.   Facility-Administered Medications Ordered in Other Visits  Medication Dose Route Frequency Provider Last Rate Last Admin   diphenhydrAMINE (BENADRYL) 50 MG/ML injection            famotidine (PEPCID) 20-0.9 MG/50ML-% IVPB              REVIEW OF SYSTEMS: On review of systems, the patient reports that she is ***     PHYSICAL EXAM:  Wt  Readings from Last 3 Encounters:  12/27/22 249 lb 1.6 oz (113 kg)  10/31/22 274 lb 12.8 oz (124.6 kg)  09/27/22 254 lb 11.2 oz (115.5 kg)   Temp Readings from Last 3 Encounters:  12/27/22 97.7 F (36.5 C) (Oral)  10/31/22 98.6 F (37 C) (Oral)  10/24/22 98.2 F (36.8 C) (Oral)   BP Readings from Last 3 Encounters:  12/27/22 104/67  10/31/22 117/67  10/24/22 100/67   Pulse Readings from Last 3 Encounters:  12/27/22 (!) 104  10/31/22 84  10/24/22 89    In general this is a well appearing  African American female in no acute distress. She's alert and  oriented x4 and appropriate throughout the examination. Cardiopulmonary assessment is negative for acute distress and she exhibits normal effort. Her left breast incision is well healed as is her axillary incision site.  Neither incision have erythema, separation or drainage.    ECOG = ***  0 - Asymptomatic (Fully active, able to carry on all predisease activities without restriction)  1 - Symptomatic but completely ambulatory (Restricted in physically strenuous activity but ambulatory and able to carry out work of a light or sedentary nature. For example, light housework, office work)  2 - Symptomatic, <50% in bed during the day (Ambulatory and capable of all self care but unable to carry out any work activities. Up and about more than 50% of waking hours)  3 - Symptomatic, >50% in bed, but not bedbound (Capable of only limited self-care, confined to bed or chair 50% or more of waking hours)  4 - Bedbound (Completely disabled. Cannot carry on any self-care. Totally confined to bed or chair)  5 - Death   Eustace Pen MM, Creech RH, Tormey DC, et al. [phone]). "Toxicity and response criteria of the [facility] Group". [place] Oncol. 5 (6): 649-55    LABORATORY DATA:  Lab Results  Component Value Date   WBC 6.0 12/27/2022   HGB 13.0 12/27/2022   HCT 38.6 12/27/2022   MCV 82.5 12/27/2022   PLT 260 12/27/2022    Lab Results  Component Value Date   NA 136 12/27/2022   K 3.4 (L) 12/27/2022   CL 101 12/27/2022   CO2 29 12/27/2022   Lab Results  Component Value Date   ALT 11 12/27/2022   AST 28 12/27/2022   ALKPHOS 66 12/27/2022   BILITOT 0.7 12/27/2022      RADIOGRAPHY: No results found.     IMPRESSION/PLAN: 1. Stage IIB, cT2N0M0 grade 3, triple negative invasive ductal carcinoma of the left breast.  Dr. Lisbeth Renshaw has evaluated her course to date and reviewed her final pathology findings and today she and I reviewed the nature of triple negative left breast disease.  She has done well since surgery, and has completed the chemotherapy portion of her systemic therapy.  Plans for ongoing immunotherapy with Keytruda***Dr. Lisbeth Renshaw recommends external radiotherapy to the breast  to reduce risks of local recurrence. We discussed the risks, benefits, short, and long term effects of radiotherapy, as well as the curative intent, and the patient is interested in proceeding.  I reviewed the delivery and logistics of radiotherapy and Dr. Lisbeth Renshaw recommends 6 1/2 weeks of radiotherapy to the left breast with deep inspiration breath-hold technique in the setting of her ongoing systemic treatment. Written consent is obtained and placed in the chart, a copy was provided to the patient.  She will simulate today 2. Contraceptive Counseling.  She had negative pregnancy testing on 12/27/2022, she is aware of the need to avoid pregnancy during radiation.***  In a visit lasting *** minutes, greater than 50% of the time was spent face to face reviewing her case, as well as in preparation of, discussing, and coordinating the patient's care.    Carola Rhine, [facility]    **Disclaimer: This note was dictated with voice recognition software. Similar sounding words can inadvertently be transcribed and this note may contain transcription errors which may not have been corrected upon publication of note.**

## 2022-12-31 ENCOUNTER — Telehealth: Payer: Self-pay | Admitting: Radiation Oncology

## 2022-12-31 ENCOUNTER — Ambulatory Visit: Payer: Medicaid Other

## 2022-12-31 ENCOUNTER — Ambulatory Visit: Payer: Medicaid Other | Admitting: Radiation Oncology

## 2022-12-31 ENCOUNTER — Ambulatory Visit: Admission: RE | Admit: 2022-12-31 | Payer: Medicaid Other | Source: Ambulatory Visit | Admitting: Radiation Oncology

## 2022-12-31 DIAGNOSIS — C50412 Malignant neoplasm of upper-outer quadrant of left female breast: Secondary | ICD-10-CM

## 2022-12-31 NOTE — Telephone Encounter (Signed)
Pt LVM asking to r/s today's appt. Returned call and LVM to r/s.

## 2023-01-01 ENCOUNTER — Ambulatory Visit
Admission: RE | Admit: 2023-01-01 | Discharge: 2023-01-01 | Disposition: A | Discharge status: Home / Self Care | Payer: Medicaid Other | Source: Ambulatory Visit | Attending: Radiation Oncology | Admitting: Radiation Oncology

## 2023-01-01 ENCOUNTER — Other Ambulatory Visit: Payer: Self-pay

## 2023-01-01 ENCOUNTER — Encounter: Payer: Self-pay | Admitting: Radiation Oncology

## 2023-01-01 VITALS — BP 112/72 | HR 90 | Temp 98.3°F | Resp 20 | Ht 70.0 in | Wt 243.0 lb

## 2023-01-01 DIAGNOSIS — Z87891 Personal history of nicotine dependence: Secondary | ICD-10-CM | POA: Insufficient documentation

## 2023-01-01 DIAGNOSIS — C50412 Malignant neoplasm of upper-outer quadrant of left female breast: Secondary | ICD-10-CM | POA: Insufficient documentation

## 2023-01-01 DIAGNOSIS — Z8042 Family history of malignant neoplasm of prostate: Secondary | ICD-10-CM | POA: Insufficient documentation

## 2023-01-01 DIAGNOSIS — Z510 Encounter for antineoplastic radiation therapy: Secondary | ICD-10-CM | POA: Diagnosis present

## 2023-01-01 DIAGNOSIS — Z803 Family history of malignant neoplasm of breast: Secondary | ICD-10-CM | POA: Insufficient documentation

## 2023-01-01 DIAGNOSIS — M129 Arthropathy, unspecified: Secondary | ICD-10-CM | POA: Diagnosis not present

## 2023-01-01 DIAGNOSIS — Z171 Estrogen receptor negative status [ER-]: Secondary | ICD-10-CM | POA: Insufficient documentation

## 2023-01-01 DIAGNOSIS — Z79899 Other long term (current) drug therapy: Secondary | ICD-10-CM | POA: Insufficient documentation

## 2023-01-01 DIAGNOSIS — E7800 Pure hypercholesterolemia, unspecified: Secondary | ICD-10-CM | POA: Insufficient documentation

## 2023-01-01 NOTE — Progress Notes (Signed)
In-person nursing interview for Malignant neoplasm of upper-outer quadrant of left breast in female, estrogen receptor negative ([place]).   Patient identity verified. Patient reports LT breast tenderness 5/10, fatigue, moderate insomnia, nausea/vomiting, reduced appetite, general joint/ foot pains 8/10. No other related issues reported at this time.  Meaningful use complete. LMP-Unknown due to last chemo TX 09/2023. Patient states "NO chances of pregnancy."  BP 112/72 (BP Location: Right Arm, Patient Position: Sitting, Cuff Size: Large)   Pulse 90   Temp 98.3 F (36.8 C) (Temporal)   Resp 20   Ht 5' 10"$  (1.778 m)   Wt 243 lb (110.2 kg)   LMP  (LMP Unknown) Comment: Chemo last TX 09/2022  SpO2 99%   BMI 34.87 kg/m   This concludes the interview.   Leandra Kern, LPN

## 2023-01-01 NOTE — Progress Notes (Signed)
Radiation Oncology         (336) [phone] ________________________________  Name: Gabrielle [name]        MRN: [id number]  Date of Service: 01/01/2023 DOB: 03/31/1991  [id number], Gabrielle Deed, PA  Truitt Merle, MD     REFERRING PHYSICIAN: Truitt Merle, MD   DIAGNOSIS: The encounter diagnosis was Malignant neoplasm of upper-outer quadrant of left breast in female, estrogen receptor negative ([place]).   HISTORY OF PRESENT ILLNESS: Gabrielle [name] is a 32 y.o. female originally seen in the multidisciplinary breast clinic for a new diagnosis of left breast cancer. The patient was noted to have a palpable mass in the left breast.  Further diagnostic work-up showed a mass in the 12 o'clock position measuring up to 3.7 cm.  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    Since her last visit, she underwent a left lumpectomy with sentinel lymph node biopsy on 04/16/2022 with Dr. Donne Hazel.  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  Her additional margin specimens were all negative, her left axillary sentinel lymph node specimen was benign fibroadipose tissue without nodal tissue within the specimen.  She returned for an additional sentinel lymph node excision on 05/14/2022 and 4 sampled lymph nodes were all negative for metastatic disease. She began systemic chemotherapy on 06/14/23 and completed chemo on 09/19/22. She is now receiving single agent consolidative Keytruda. She is seen today to discuss treatment recommendations regarding radiotherapy.   PREVIOUS RADIATION THERAPY: No   PAST MEDICAL HISTORY:  Past Medical History:  Diagnosis Date   Arthritis    Bilateral Knees   Cancer ([place]) 03/11/2022   Breast   Family history of breast cancer 03/21/2022   Family history of prostate cancer 03/21/2022    Hypercholesteremia        PAST SURGICAL HISTORY: Past Surgical History:  Procedure Laterality Date   AXILLARY SENTINEL NODE BIOPSY Left 05/14/2022   Procedure: LEFT AXILLARY SENTINEL NODE BIOPSY;  Surgeon: Rolm Bookbinder, MD;  Location: [facility];  Service: General;  Laterality: Left;  GEN w/ PEC BLOCK   BREAST BIOPSY Left 03/11/2022   BREAST LUMPECTOMY WITH AXILLARY LYMPH NODE BIOPSY Left 04/16/2022   Procedure: LEFT BREAST LUMPECTOMY WITH LEFT AXILLARY SENTINEL LYMPH NODE BIOPSY;  Surgeon: Rolm Bookbinder, MD;  Location: [facility];  Service: General;  Laterality: Left;   DENTAL SURGERY     PORTACATH PLACEMENT Right 04/16/2022   Procedure: INSERTION PORT-A-CATH;  Surgeon: Rolm Bookbinder, MD;  Location: [facility];  Service: General;  Laterality: Right;     FAMILY HISTORY:  Family History  Problem Relation Age of Onset   Breast cancer Mother 73   Prostate cancer Father 28   Diabetes Brother    Breast cancer Maternal Aunt 63   Breast cancer Paternal Aunt        dx unknown age   Prostate cancer Paternal Uncle        dx after 58   Breast cancer Paternal Grandmother        dx after 53   Prostate cancer Paternal Grandfather        dx 44s; metastatic   ADD / ADHD Son      SOCIAL HISTORY:  reports that she quit smoking about a year ago. Her smoking use included cigarettes. She has a 0.75 pack-year  smoking history. She has never used smokeless tobacco. She reports current alcohol use. She reports that she does not currently use drugs after having used the following drugs: Marijuana. The patient is single and lives in [place]. She is unemployed and living with her sister. She has a 34 year old son whom she has custody of, and her daughter who is 107 lives with her father.    ALLERGIES: Patient has no known allergies.   MEDICATIONS:  Current Outpatient Medications  Medication Sig Dispense Refill   acetaminophen (TYLENOL) 500 MG tablet Take 1,000  mg by mouth every 6 (six) hours as needed (pain.).     ALPRAZolam (XANAX) 0.5 MG tablet Take 1 tablet (0.5 mg total) by mouth as needed for anxiety. 30 tablet 0   aspirin-acetaminophen-caffeine (EXCEDRIN MIGRAINE) 250-250-65 MG tablet Take by mouth every 6 (six) hours as needed for headache.     calcium-vitamin D (OSCAL WITH D) 500-5 MG-MCG tablet Take 1 tablet by mouth 2 (two) times daily for 7 days. 14 tablet 0   gabapentin (NEURONTIN) 100 MG capsule Take 1-2 capsules (100-200 mg total) by mouth 2 (two) times daily. 60 capsule 0   ibuprofen (ADVIL) 200 MG tablet Take 200 mg by mouth every 8 (eight) hours as needed (pain).     methocarbamol (ROBAXIN) 500 MG tablet Take by mouth as needed.     mirtazapine (REMERON) 15 MG tablet Take 1 tablet (15 mg total) by mouth at bedtime. 30 tablet 2   ondansetron (ZOFRAN) 4 MG tablet Take 4 mg by mouth every 8 (eight) hours as needed for nausea or vomiting.     pantoprazole (PROTONIX) 20 MG tablet Take 1 tablet (20 mg total) by mouth daily. 30 tablet 1   potassium chloride SA (KLOR-CON M) 20 MEQ tablet Take 1 tablet (20 mEq total) by mouth daily. 30 tablet 0   prochlorperazine (COMPAZINE) 10 MG tablet Take 1 tablet (10 mg total) by mouth every 6 (six) hours as needed for nausea or vomiting. 30 tablet 0   promethazine (PHENERGAN) 25 MG tablet Take 1 tablet (25 mg total) by mouth every 6 (six) hours as needed for nausea or vomiting. 30 tablet 0   zolpidem (AMBIEN) 5 MG tablet Take 1 tablet (5 mg total) by mouth at bedtime as needed for sleep. 30 tablet 0   No current facility-administered medications for this encounter.   Facility-Administered Medications Ordered in Other Encounters  Medication Dose Route Frequency Provider Last Rate Last Admin   diphenhydrAMINE (BENADRYL) 50 MG/ML injection            famotidine (PEPCID) 20-0.9 MG/50ML-% IVPB              REVIEW OF SYSTEMS: On review of systems, the patient reports that she is doing okay. She reports she  has been tired and having joint pains from immunotherapy but feeling a little bit better since this was discontinued. She is waiting on an appointment from rheumatology. No other complaints are verbalized.      PHYSICAL EXAM:  Wt Readings from Last 3 Encounters:  01/01/23 243 lb (110.2 kg)  12/27/22 249 lb 1.6 oz (113 kg)  10/31/22 274 lb 12.8 oz (124.6 kg)   Temp Readings from Last 3 Encounters:  01/01/23 98.3 F (36.8 C) (Temporal)  12/27/22 97.7 F (36.5 C) (Oral)  10/31/22 98.6 F (37 C) (Oral)   BP Readings from Last 3 Encounters:  01/01/23 112/72  12/27/22 104/67  10/31/22 117/67   Pulse Readings  from Last 3 Encounters:  01/01/23 90  12/27/22 (!) 104  10/31/22 84    In general this is a well appearing  African American female in no acute distress. She's alert and oriented x4 and appropriate throughout the examination. Cardiopulmonary assessment is negative for acute distress and she exhibits normal effort. Her left breast incision is well healed as is her axillary incision site.  Neither incision have erythema, separation or drainage.    ECOG = 1  0 - Asymptomatic (Fully active, able to carry on all predisease activities without restriction)  1 - Symptomatic but completely ambulatory (Restricted in physically strenuous activity but ambulatory and able to carry out work of a light or sedentary nature. For example, light housework, office work)  2 - Symptomatic, <50% in bed during the day (Ambulatory and capable of all self care but unable to carry out any work activities. Up and about more than 50% of waking hours)  3 - Symptomatic, >50% in bed, but not bedbound (Capable of only limited self-care, confined to bed or chair 50% or more of waking hours)  4 - Bedbound (Completely disabled. Cannot carry on any self-care. Totally confined to bed or chair)  5 - Death   Eustace Pen MM, Creech RH, Tormey DC, et al. [phone]). "Toxicity and response criteria of the [facility]". [place] Oncol. 5 (6): 649-55    LABORATORY DATA:  Lab Results  Component Value Date   WBC 6.0 12/27/2022   HGB 13.0 12/27/2022   HCT 38.6 12/27/2022   MCV 82.5 12/27/2022   PLT 260 12/27/2022   Lab Results  Component Value Date   NA 136 12/27/2022   K 3.4 (L) 12/27/2022   CL 101 12/27/2022   CO2 29 12/27/2022   Lab Results  Component Value Date   ALT 11 12/27/2022   AST 28 12/27/2022   ALKPHOS 66 12/27/2022   BILITOT 0.7 12/27/2022      RADIOGRAPHY: No results found.     IMPRESSION/PLAN: 1. Stage IIB, cT2N0M0 grade 3, triple negative invasive ductal carcinoma of the left breast.  Dr. Lisbeth Renshaw has evaluated her course to date and reviewed her final pathology findings and today she and I reviewed the nature of triple negative left breast disease.  She has done well since surgery, and has completed the chemotherapy portion of her systemic therapy.  Immunotherapy plans have also been cancelled. Dr. Lisbeth Renshaw recommends external radiotherapy to the breast  to reduce risks of local recurrence. We discussed the risks, benefits, short, and long term effects of radiotherapy, as well as the curative intent, and the patient is interested in proceeding.  I reviewed the delivery and logistics of radiotherapy and Dr. Lisbeth Renshaw recommends 6 1/2 weeks of radiotherapy to the left breast with deep inspiration breath-hold technique in the setting of her ongoing systemic treatment. Written consent is obtained and placed in the chart, a copy was provided to the patient.  She will simulate today. 2. Contraceptive Counseling.  She had negative pregnancy testing on 12/27/2022, she is aware of the need to avoid pregnancy during radiation.   In a visit lasting 45 minutes, greater than 50% of the time was spent face to face reviewing her case, as well as in preparation of, discussing, and coordinating the patient's care.    Carola Rhine, [facility]    **Disclaimer: This note was dictated with  voice recognition software. Similar sounding words can inadvertently be transcribed and this note may contain transcription errors  which may not have been corrected upon publication of note.**

## 2023-01-02 ENCOUNTER — Other Ambulatory Visit: Payer: Self-pay

## 2023-01-02 ENCOUNTER — Other Ambulatory Visit: Payer: Medicaid Other

## 2023-01-02 ENCOUNTER — Inpatient Hospital Stay: Payer: Medicaid Other | Admitting: Hematology

## 2023-01-02 ENCOUNTER — Inpatient Hospital Stay: Payer: Medicaid Other

## 2023-01-08 ENCOUNTER — Encounter: Payer: Self-pay | Admitting: *Deleted

## 2023-01-09 ENCOUNTER — Encounter (HOSPITAL_COMMUNITY)
Admission: RE | Admit: 2023-01-09 | Discharge: 2023-01-09 | Disposition: A | Discharge status: Home / Self Care | Payer: Medicaid Other | Source: Ambulatory Visit | Attending: Hematology | Admitting: Hematology

## 2023-01-09 DIAGNOSIS — Z171 Estrogen receptor negative status [ER-]: Secondary | ICD-10-CM

## 2023-01-09 DIAGNOSIS — C50412 Malignant neoplasm of upper-outer quadrant of left female breast: Secondary | ICD-10-CM | POA: Insufficient documentation

## 2023-01-09 MED ORDER — TECHNETIUM TC 99M MEDRONATE IV KIT
20.0000 | PACK | Freq: Once | INTRAVENOUS | Status: AC | PRN
  Administered 2023-01-09: 20.98 via INTRAVENOUS

## 2023-01-13 ENCOUNTER — Ambulatory Visit (HOSPITAL_COMMUNITY): Admission: RE | Admit: 2023-01-13 | Payer: Medicaid Other | Source: Ambulatory Visit

## 2023-01-15 ENCOUNTER — Encounter: Payer: Self-pay | Admitting: *Deleted

## 2023-01-15 DIAGNOSIS — Z510 Encounter for antineoplastic radiation therapy: Secondary | ICD-10-CM | POA: Diagnosis not present

## 2023-01-16 ENCOUNTER — Ambulatory Visit
Admission: RE | Admit: 2023-01-16 | Discharge: 2023-01-16 | Disposition: A | Discharge status: Home / Self Care | Payer: Medicaid Other | Source: Ambulatory Visit | Attending: Radiation Oncology | Admitting: Radiation Oncology

## 2023-01-16 ENCOUNTER — Other Ambulatory Visit: Payer: Self-pay

## 2023-01-16 DIAGNOSIS — Z510 Encounter for antineoplastic radiation therapy: Secondary | ICD-10-CM | POA: Diagnosis not present

## 2023-01-16 DIAGNOSIS — C50412 Malignant neoplasm of upper-outer quadrant of left female breast: Secondary | ICD-10-CM

## 2023-01-16 LAB — RAD ONC ARIA SESSION SUMMARY
Course Elapsed Days: 0
Plan Fractions Treated to Date: 1
Plan Prescribed Dose Per Fraction: 1.8 Gy
Plan Total Fractions Prescribed: 28
Plan Total Prescribed Dose: 50.4 Gy
Reference Point Dosage Given to Date: 1.8 Gy
Reference Point Session Dosage Given: 1.8 Gy
Session Number: 1

## 2023-01-16 MED ORDER — ALRA NON-METALLIC DEODORANT (RAD-ONC)
1.0000 | Freq: Once | TOPICAL | Status: AC
  Administered 2023-01-16: 1 via TOPICAL

## 2023-01-16 MED ORDER — RADIAPLEXRX EX GEL: Freq: Once | CUTANEOUS | Status: AC

## 2023-01-16 NOTE — Progress Notes (Signed)
Pt here for patient teaching. Pt given Radiation and You booklet, skin care instructions, Alra deodorant, and Radiaplex gel. Reviewed areas of pertinence such as fatigue, hair loss, nausea and vomiting, skin changes, breast tenderness, breast swelling, cough, and shortness of breath. Pt able to give teach back of to pat skin, use unscented/gentle soap, and drink plenty of water, apply Radiaplex bid, avoid applying anything to skin within 4 hours of treatment, avoid wearing an under wire bra, and to use an electric razor if they must shave. Pt verbalizes understanding of information given and will contact nursing with any questions or concerns.     [email or web address]

## 2023-01-17 ENCOUNTER — Ambulatory Visit
Admission: RE | Admit: 2023-01-17 | Discharge: 2023-01-17 | Disposition: A | Discharge status: Home / Self Care | Payer: Medicaid Other | Source: Ambulatory Visit | Attending: Radiation Oncology | Admitting: Radiation Oncology

## 2023-01-17 ENCOUNTER — Inpatient Hospital Stay: Payer: Medicaid Other | Admitting: Hematology

## 2023-01-17 ENCOUNTER — Other Ambulatory Visit: Payer: Self-pay

## 2023-01-17 ENCOUNTER — Inpatient Hospital Stay: Payer: Medicaid Other

## 2023-01-17 DIAGNOSIS — C50412 Malignant neoplasm of upper-outer quadrant of left female breast: Secondary | ICD-10-CM | POA: Insufficient documentation

## 2023-01-17 DIAGNOSIS — Z510 Encounter for antineoplastic radiation therapy: Secondary | ICD-10-CM | POA: Diagnosis not present

## 2023-01-17 DIAGNOSIS — Z171 Estrogen receptor negative status [ER-]: Secondary | ICD-10-CM | POA: Insufficient documentation

## 2023-01-17 LAB — RAD ONC ARIA SESSION SUMMARY
Course Elapsed Days: 1
Plan Fractions Treated to Date: 2
Plan Prescribed Dose Per Fraction: 1.8 Gy
Plan Total Fractions Prescribed: 28
Plan Total Prescribed Dose: 50.4 Gy
Reference Point Dosage Given to Date: 3.6 Gy
Reference Point Session Dosage Given: 1.8 Gy
Session Number: 2

## 2023-01-17 NOTE — Assessment & Plan Note (Deleted)
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 SLN biopsy on 05/14/22 was negative (0/4). -she started zoladex on 06/03/22. -she began chemotherapy with carboplatin/taxol and Keytruda on 06/14/22. Taxol switched to abraxane with C2D8 due to itching and mild numbness. 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 -she switched to [facility] with continuing [place] on 09/19/22. She tolerated poorly with persistent N/V, fatigue and anorexia, so treatment was stopped after one cycle, I encourage her to continue Keytruda for one year -She has recently developed diffuse arthralgia, and mass, which has significantly impacted her daily activities, this could be related to immunotherapy Keytruda.  I will stop her Keytruda  -

## 2023-01-20 ENCOUNTER — Ambulatory Visit
Admission: RE | Admit: 2023-01-20 | Discharge: 2023-01-20 | Disposition: A | Discharge status: Home / Self Care | Payer: Medicaid Other | Source: Ambulatory Visit | Attending: Radiation Oncology | Admitting: Radiation Oncology

## 2023-01-20 ENCOUNTER — Other Ambulatory Visit: Payer: Self-pay

## 2023-01-20 DIAGNOSIS — Z510 Encounter for antineoplastic radiation therapy: Secondary | ICD-10-CM | POA: Diagnosis not present

## 2023-01-20 LAB — RAD ONC ARIA SESSION SUMMARY
Course Elapsed Days: 4
Plan Fractions Treated to Date: 3
Plan Prescribed Dose Per Fraction: 1.8 Gy
Plan Total Fractions Prescribed: 28
Plan Total Prescribed Dose: 50.4 Gy
Reference Point Dosage Given to Date: 5.4 Gy
Reference Point Session Dosage Given: 1.8 Gy
Session Number: 3

## 2023-01-21 ENCOUNTER — Ambulatory Visit
Admission: RE | Admit: 2023-01-21 | Discharge: 2023-01-21 | Disposition: A | Discharge status: Home / Self Care | Payer: Medicaid Other | Source: Ambulatory Visit | Attending: Radiation Oncology | Admitting: Radiation Oncology

## 2023-01-21 ENCOUNTER — Other Ambulatory Visit: Payer: Self-pay

## 2023-01-21 DIAGNOSIS — Z510 Encounter for antineoplastic radiation therapy: Secondary | ICD-10-CM | POA: Diagnosis not present

## 2023-01-21 LAB — RAD ONC ARIA SESSION SUMMARY
Course Elapsed Days: 5
Plan Fractions Treated to Date: 4
Plan Prescribed Dose Per Fraction: 1.8 Gy
Plan Total Fractions Prescribed: 28
Plan Total Prescribed Dose: 50.4 Gy
Reference Point Dosage Given to Date: 7.2 Gy
Reference Point Session Dosage Given: 1.8 Gy
Session Number: 4

## 2023-01-22 ENCOUNTER — Ambulatory Visit
Admission: RE | Admit: 2023-01-22 | Discharge: 2023-01-22 | Disposition: A | Discharge status: Home / Self Care | Payer: Medicaid Other | Source: Ambulatory Visit | Attending: Radiation Oncology | Admitting: Radiation Oncology

## 2023-01-22 ENCOUNTER — Other Ambulatory Visit: Payer: Self-pay

## 2023-01-22 DIAGNOSIS — Z510 Encounter for antineoplastic radiation therapy: Secondary | ICD-10-CM | POA: Diagnosis not present

## 2023-01-22 LAB — RAD ONC ARIA SESSION SUMMARY
Course Elapsed Days: 6
Plan Fractions Treated to Date: 5
Plan Prescribed Dose Per Fraction: 1.8 Gy
Plan Total Fractions Prescribed: 28
Plan Total Prescribed Dose: 50.4 Gy
Reference Point Dosage Given to Date: 9 Gy
Reference Point Session Dosage Given: 1.8 Gy
Session Number: 5

## 2023-01-23 ENCOUNTER — Ambulatory Visit
Admission: RE | Admit: 2023-01-23 | Discharge: 2023-01-23 | Disposition: A | Discharge status: Home / Self Care | Payer: Medicaid Other | Source: Ambulatory Visit | Attending: Radiation Oncology | Admitting: Radiation Oncology

## 2023-01-23 ENCOUNTER — Other Ambulatory Visit: Payer: Self-pay

## 2023-01-23 DIAGNOSIS — Z510 Encounter for antineoplastic radiation therapy: Secondary | ICD-10-CM | POA: Diagnosis not present

## 2023-01-23 LAB — RAD ONC ARIA SESSION SUMMARY
Course Elapsed Days: 7
Plan Fractions Treated to Date: 6
Plan Prescribed Dose Per Fraction: 1.8 Gy
Plan Total Fractions Prescribed: 28
Plan Total Prescribed Dose: 50.4 Gy
Reference Point Dosage Given to Date: 10.8 Gy
Reference Point Session Dosage Given: 1.8 Gy
Session Number: 6

## 2023-01-24 ENCOUNTER — Other Ambulatory Visit: Payer: Self-pay

## 2023-01-24 ENCOUNTER — Ambulatory Visit
Admission: RE | Admit: 2023-01-24 | Discharge: 2023-01-24 | Disposition: A | Discharge status: Home / Self Care | Payer: Medicaid Other | Source: Ambulatory Visit | Attending: Radiation Oncology | Admitting: Radiation Oncology

## 2023-01-24 DIAGNOSIS — Z510 Encounter for antineoplastic radiation therapy: Secondary | ICD-10-CM | POA: Diagnosis not present

## 2023-01-24 LAB — RAD ONC ARIA SESSION SUMMARY
Course Elapsed Days: 8
Plan Fractions Treated to Date: 7
Plan Prescribed Dose Per Fraction: 1.8 Gy
Plan Total Fractions Prescribed: 28
Plan Total Prescribed Dose: 50.4 Gy
Reference Point Dosage Given to Date: 12.6 Gy
Reference Point Session Dosage Given: 1.8 Gy
Session Number: 7

## 2023-01-27 ENCOUNTER — Other Ambulatory Visit: Payer: Self-pay

## 2023-01-27 ENCOUNTER — Ambulatory Visit
Admission: RE | Admit: 2023-01-27 | Discharge: 2023-01-27 | Disposition: A | Discharge status: Home / Self Care | Payer: Medicaid Other | Source: Ambulatory Visit | Attending: Radiation Oncology | Admitting: Radiation Oncology

## 2023-01-27 DIAGNOSIS — Z510 Encounter for antineoplastic radiation therapy: Secondary | ICD-10-CM | POA: Diagnosis not present

## 2023-01-27 LAB — RAD ONC ARIA SESSION SUMMARY
Course Elapsed Days: 11
Plan Fractions Treated to Date: 8
Plan Prescribed Dose Per Fraction: 1.8 Gy
Plan Total Fractions Prescribed: 28
Plan Total Prescribed Dose: 50.4 Gy
Reference Point Dosage Given to Date: 14.4 Gy
Reference Point Session Dosage Given: 1.8 Gy
Session Number: 8

## 2023-01-28 ENCOUNTER — Other Ambulatory Visit: Payer: Self-pay

## 2023-01-28 ENCOUNTER — Ambulatory Visit
Admission: RE | Admit: 2023-01-28 | Discharge: 2023-01-28 | Disposition: A | Discharge status: Home / Self Care | Payer: Medicaid Other | Source: Ambulatory Visit | Attending: Radiation Oncology | Admitting: Radiation Oncology

## 2023-01-28 DIAGNOSIS — Z510 Encounter for antineoplastic radiation therapy: Secondary | ICD-10-CM | POA: Diagnosis not present

## 2023-01-28 LAB — RAD ONC ARIA SESSION SUMMARY
Course Elapsed Days: 12
Plan Fractions Treated to Date: 9
Plan Prescribed Dose Per Fraction: 1.8 Gy
Plan Total Fractions Prescribed: 28
Plan Total Prescribed Dose: 50.4 Gy
Reference Point Dosage Given to Date: 16.2 Gy
Reference Point Session Dosage Given: 1.8 Gy
Session Number: 9

## 2023-01-29 ENCOUNTER — Ambulatory Visit
Admission: RE | Admit: 2023-01-29 | Discharge: 2023-01-29 | Disposition: A | Discharge status: Home / Self Care | Payer: Medicaid Other | Source: Ambulatory Visit | Attending: Radiation Oncology | Admitting: Radiation Oncology

## 2023-01-29 ENCOUNTER — Other Ambulatory Visit: Payer: Self-pay

## 2023-01-29 ENCOUNTER — Inpatient Hospital Stay: Payer: Medicaid Other | Attending: Hematology

## 2023-01-29 ENCOUNTER — Ambulatory Visit (HOSPITAL_COMMUNITY): Payer: Medicaid Other

## 2023-01-29 DIAGNOSIS — Z510 Encounter for antineoplastic radiation therapy: Secondary | ICD-10-CM | POA: Diagnosis not present

## 2023-01-29 LAB — RAD ONC ARIA SESSION SUMMARY
Course Elapsed Days: 13
Plan Fractions Treated to Date: 10
Plan Prescribed Dose Per Fraction: 1.8 Gy
Plan Total Fractions Prescribed: 28
Plan Total Prescribed Dose: 50.4 Gy
Reference Point Dosage Given to Date: 18 Gy
Reference Point Session Dosage Given: 1.8 Gy
Session Number: 10

## 2023-01-30 ENCOUNTER — Telehealth: Payer: Self-pay | Admitting: Physician Assistant

## 2023-01-30 ENCOUNTER — Ambulatory Visit
Admission: RE | Admit: 2023-01-30 | Discharge: 2023-01-30 | Disposition: A | Discharge status: Home / Self Care | Payer: Medicaid Other | Source: Ambulatory Visit | Attending: Radiation Oncology | Admitting: Radiation Oncology

## 2023-01-30 ENCOUNTER — Other Ambulatory Visit: Payer: Self-pay

## 2023-01-30 DIAGNOSIS — Z510 Encounter for antineoplastic radiation therapy: Secondary | ICD-10-CM | POA: Diagnosis not present

## 2023-01-30 LAB — RAD ONC ARIA SESSION SUMMARY
Course Elapsed Days: 14
Plan Fractions Treated to Date: 11
Plan Prescribed Dose Per Fraction: 1.8 Gy
Plan Total Fractions Prescribed: 28
Plan Total Prescribed Dose: 50.4 Gy
Reference Point Dosage Given to Date: 19.8 Gy
Reference Point Session Dosage Given: 1.8 Gy
Session Number: 11

## 2023-01-30 NOTE — Telephone Encounter (Signed)
I received notification from radiology that the patient missed her scan.  I called the patient to discuss this.  She states that she is fatigued with her radiation appointments and she only wants to focus on 1 appointment at a time.  She would like to reschedule her CT scan for after radiation which is scheduled to be completed on 03/03/2023.  I also reviewed the results of her bone scan with her which was negative for any metastatic disease to the bone.  She is scheduled to see rheumatology in May to assess why she is having arthralgias.  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  I confirmed that the patient had the number to the radiology schedulers.  I strongly encouraged her to at least get her follow-up CT scan scheduled so that I may reach out to my scheduling team to arrange for follow-up a few days after her CT is performed.  I discussed the importance of always having a follow-up scheduled on the books so patients do not get lost to follow-up.  She expressed understanding.

## 2023-01-31 ENCOUNTER — Ambulatory Visit
Admission: RE | Admit: 2023-01-31 | Discharge: 2023-01-31 | Disposition: A | Discharge status: Home / Self Care | Payer: Medicaid Other | Source: Ambulatory Visit | Attending: Radiation Oncology | Admitting: Radiation Oncology

## 2023-01-31 ENCOUNTER — Inpatient Hospital Stay: Payer: Medicaid Other | Admitting: Physician Assistant

## 2023-01-31 ENCOUNTER — Other Ambulatory Visit: Payer: Self-pay

## 2023-01-31 ENCOUNTER — Inpatient Hospital Stay: Payer: Medicaid Other

## 2023-01-31 DIAGNOSIS — Z510 Encounter for antineoplastic radiation therapy: Secondary | ICD-10-CM | POA: Diagnosis not present

## 2023-01-31 LAB — RAD ONC ARIA SESSION SUMMARY
Course Elapsed Days: 15
Plan Fractions Treated to Date: 12
Plan Prescribed Dose Per Fraction: 1.8 Gy
Plan Total Fractions Prescribed: 28
Plan Total Prescribed Dose: 50.4 Gy
Reference Point Dosage Given to Date: 21.6 Gy
Reference Point Session Dosage Given: 1.8 Gy
Session Number: 12

## 2023-02-03 ENCOUNTER — Encounter: Payer: Self-pay | Admitting: *Deleted

## 2023-02-03 ENCOUNTER — Ambulatory Visit
Admission: RE | Admit: 2023-02-03 | Discharge: 2023-02-03 | Disposition: A | Discharge status: Home / Self Care | Payer: Medicaid Other | Source: Ambulatory Visit | Attending: Radiation Oncology | Admitting: Radiation Oncology

## 2023-02-03 ENCOUNTER — Telehealth: Payer: Self-pay | Admitting: Hematology

## 2023-02-03 ENCOUNTER — Other Ambulatory Visit: Payer: Self-pay

## 2023-02-03 DIAGNOSIS — Z510 Encounter for antineoplastic radiation therapy: Secondary | ICD-10-CM | POA: Diagnosis not present

## 2023-02-03 LAB — RAD ONC ARIA SESSION SUMMARY
Course Elapsed Days: 18
Plan Fractions Treated to Date: 13
Plan Prescribed Dose Per Fraction: 1.8 Gy
Plan Total Fractions Prescribed: 28
Plan Total Prescribed Dose: 50.4 Gy
Reference Point Dosage Given to Date: 23.4 Gy
Reference Point Session Dosage Given: 1.8 Gy
Session Number: 13

## 2023-02-03 NOTE — Telephone Encounter (Signed)
Per 3/18 IB reached out to patient to schedule; patient aware of date and time of appointment.

## 2023-02-04 ENCOUNTER — Ambulatory Visit
Admission: RE | Admit: 2023-02-04 | Discharge: 2023-02-04 | Disposition: A | Discharge status: Home / Self Care | Payer: Medicaid Other | Source: Ambulatory Visit | Attending: Radiation Oncology | Admitting: Radiation Oncology

## 2023-02-04 ENCOUNTER — Other Ambulatory Visit: Payer: Self-pay

## 2023-02-04 DIAGNOSIS — Z510 Encounter for antineoplastic radiation therapy: Secondary | ICD-10-CM | POA: Diagnosis not present

## 2023-02-04 LAB — RAD ONC ARIA SESSION SUMMARY
Course Elapsed Days: 19
Plan Fractions Treated to Date: 14
Plan Prescribed Dose Per Fraction: 1.8 Gy
Plan Total Fractions Prescribed: 28
Plan Total Prescribed Dose: 50.4 Gy
Reference Point Dosage Given to Date: 25.2 Gy
Reference Point Session Dosage Given: 1.8 Gy
Session Number: 14

## 2023-02-05 ENCOUNTER — Ambulatory Visit
Admission: RE | Admit: 2023-02-05 | Discharge: 2023-02-05 | Disposition: A | Discharge status: Home / Self Care | Payer: Medicaid Other | Source: Ambulatory Visit | Attending: Radiation Oncology | Admitting: Radiation Oncology

## 2023-02-05 ENCOUNTER — Other Ambulatory Visit: Payer: Self-pay

## 2023-02-05 DIAGNOSIS — Z510 Encounter for antineoplastic radiation therapy: Secondary | ICD-10-CM | POA: Diagnosis not present

## 2023-02-05 LAB — RAD ONC ARIA SESSION SUMMARY
Course Elapsed Days: 20
Plan Fractions Treated to Date: 15
Plan Prescribed Dose Per Fraction: 1.8 Gy
Plan Total Fractions Prescribed: 28
Plan Total Prescribed Dose: 50.4 Gy
Reference Point Dosage Given to Date: 27 Gy
Reference Point Session Dosage Given: 1.8 Gy
Session Number: 15

## 2023-02-05 NOTE — Progress Notes (Deleted)
NEUROLOGY CONSULTATION NOTE  Gabrielle [name] MRN: [id number] DOB: 1991/08/29  Referring provider: Dede Query, PA-C Primary care provider: Raelyn Number, PA  Reason for consult:  headache  Assessment/Plan:   ***   Subjective:  Gabrielle [name] is a 32 year old female with history of breast cancer who presents for headache.  History supplemented by referring provider's note.  She was diagnosed with breast cancer in April 2023.  She has been undergoing chemotherapy (pembrolizumab) since July 2023. ***  MRI of brain with and without contrast on 09/09/2022 personally reviewed was normal.    Past NSAIDS/analgesics:  naproxen, tramadol Past abortive triptans:  *** Past abortive ergotamine:  *** Past muscle relaxants:  *** Past anti-emetic:  ondansetron Past antihypertensive medications:  *** Past antidepressant medications:  *** Past anticonvulsant medications:  *** Past anti-CGRP:  *** Past vitamins/Herbal/Supplements:  magnesium oxide Past antihistamines/decongestants:  *** Other past therapies:  ***  Current NSAIDS/analgesics:  Excedrin MIgraine, ibuprofen Current triptans:  none Current ergotamine:  none Current anti-emetic:  prochlorperazine, promethazine Current muscle relaxants:  methocarbamol Current Antihypertensive medications:  none Current Antidepressant medications:  mirtazapine 15mg  Current Anticonvulsant medications:  gabapentin 100-200mg  BID Current anti-CGRP:  none Current Vitamins/Herbal/Supplements:  Ca-D Current Antihistamines/Decongestants:  none Other therapy:  *** Birth control:  none Other medications:  zolpidem, alprazolam   Caffeine:  *** Alcohol:  *** Smoker:  *** Diet:  *** Exercise:  *** Depression:  ***; Anxiety:  *** Other pain:  *** Sleep hygiene:  *** Family history of headache:  ***      PAST MEDICAL HISTORY: Past Medical History:  Diagnosis Date   Arthritis    Bilateral Knees   Cancer ([place]) 03/11/2022   Breast    Family history of breast cancer 03/21/2022   Family history of prostate cancer 03/21/2022   Hypercholesteremia     PAST SURGICAL HISTORY: Past Surgical History:  Procedure Laterality Date   AXILLARY SENTINEL NODE BIOPSY Left 05/14/2022   Procedure: LEFT AXILLARY SENTINEL NODE BIOPSY;  Surgeon: Rolm Bookbinder, MD;  Location: [place] [facility];  Service: General;  Laterality: Left;  GEN w/ PEC BLOCK   BREAST BIOPSY Left 03/11/2022   BREAST LUMPECTOMY WITH AXILLARY LYMPH NODE BIOPSY Left 04/16/2022   Procedure: LEFT BREAST LUMPECTOMY WITH LEFT AXILLARY SENTINEL LYMPH NODE BIOPSY;  Surgeon: Rolm Bookbinder, MD;  Location: [place];  Service: General;  Laterality: Left;   DENTAL SURGERY     PORTACATH PLACEMENT Right 04/16/2022   Procedure: INSERTION PORT-A-CATH;  Surgeon: Rolm Bookbinder, MD;  Location: [place];  Service: General;  Laterality: Right;    MEDICATIONS: Current Outpatient Medications on File Prior to Visit  Medication Sig Dispense Refill   acetaminophen (TYLENOL) 500 MG tablet Take 1,000 mg by mouth every 6 (six) hours as needed (pain.).     ALPRAZolam (XANAX) 0.5 MG tablet Take 1 tablet (0.5 mg total) by mouth as needed for anxiety. 30 tablet 0   aspirin-acetaminophen-caffeine (EXCEDRIN MIGRAINE) 250-250-65 MG tablet Take by mouth every 6 (six) hours as needed for headache.     calcium-vitamin D (OSCAL WITH D) 500-5 MG-MCG tablet Take 1 tablet by mouth 2 (two) times daily for 7 days. 14 tablet 0   gabapentin (NEURONTIN) 100 MG capsule Take 1-2 capsules (100-200 mg total) by mouth 2 (two) times daily. 60 capsule 0   ibuprofen (ADVIL) 200 MG tablet Take 200 mg by mouth every 8 (eight) hours as needed (pain).     methocarbamol (ROBAXIN) 500 MG  tablet Take by mouth as needed.     mirtazapine (REMERON) 15 MG tablet Take 1 tablet (15 mg total) by mouth at bedtime. 30 tablet 2   ondansetron (ZOFRAN) 4 MG tablet Take 4 mg by mouth every 8 (eight) hours as  needed for nausea or vomiting.     pantoprazole (PROTONIX) 20 MG tablet Take 1 tablet (20 mg total) by mouth daily. 30 tablet 1   potassium chloride SA (KLOR-CON M) 20 MEQ tablet Take 1 tablet (20 mEq total) by mouth daily. 30 tablet 0   prochlorperazine (COMPAZINE) 10 MG tablet Take 1 tablet (10 mg total) by mouth every 6 (six) hours as needed for nausea or vomiting. 30 tablet 0   promethazine (PHENERGAN) 25 MG tablet Take 1 tablet (25 mg total) by mouth every 6 (six) hours as needed for nausea or vomiting. 30 tablet 0   zolpidem (AMBIEN) 5 MG tablet Take 1 tablet (5 mg total) by mouth at bedtime as needed for sleep. 30 tablet 0   Current Facility-Administered Medications on File Prior to Visit  Medication Dose Route Frequency Provider Last Rate Last Admin   diphenhydrAMINE (BENADRYL) 50 MG/ML injection            famotidine (PEPCID) 20-0.9 MG/50ML-% IVPB             ALLERGIES: No Known Allergies  FAMILY HISTORY: Family History  Problem Relation Age of Onset   Breast cancer Mother 77   Prostate cancer Father 13   Diabetes Brother    Breast cancer Maternal Aunt 80   Breast cancer Paternal Aunt        dx unknown age   Prostate cancer Paternal Uncle        dx after 27   Breast cancer Paternal Grandmother        dx after 96   Prostate cancer Paternal Grandfather        dx 12s; metastatic   ADD / ADHD Son     Objective:  *** General: No acute distress.  Patient appears well-groomed.   Head:  Normocephalic/atraumatic Eyes:  fundi examined but not visualized Neck: supple, no paraspinal tenderness, full range of motion Back: No paraspinal tenderness Heart: regular rate and rhythm Lungs: Clear to auscultation bilaterally. Vascular: No carotid bruits. Neurological Exam: Mental status: alert and oriented to person, place, and time, speech fluent and not dysarthric, language intact. Cranial nerves: CN I: not tested CN II: pupils equal, round and reactive to light, visual fields  intact CN III, IV, VI:  full range of motion, no nystagmus, no ptosis CN V: facial sensation intact. CN VII: upper and lower face symmetric CN VIII: hearing intact CN IX, X: gag intact, uvula midline CN XI: sternocleidomastoid and trapezius muscles intact CN XII: tongue midline Bulk & Tone: normal, no fasciculations. Motor:  muscle strength 5/5 throughout Sensation:  Pinprick, temperature and vibratory sensation intact. Deep Tendon Reflexes:  2+ throughout,  toes downgoing.   Finger to nose testing:  Without dysmetria.   Heel to shin:  Without dysmetria.   Gait:  Normal station and stride.  Romberg negative.    Thank you for allowing me to take part in the care of this patient.  Metta Clines, DO  CC: ***

## 2023-02-06 ENCOUNTER — Ambulatory Visit: Admission: RE | Admit: 2023-02-06 | Payer: Medicaid Other | Source: Ambulatory Visit

## 2023-02-07 ENCOUNTER — Ambulatory Visit
Admission: RE | Admit: 2023-02-07 | Discharge: 2023-02-07 | Disposition: A | Discharge status: Home / Self Care | Payer: Medicaid Other | Source: Ambulatory Visit | Attending: Radiation Oncology | Admitting: Radiation Oncology

## 2023-02-07 ENCOUNTER — Ambulatory Visit: Payer: Medicaid Other | Admitting: Neurology

## 2023-02-07 ENCOUNTER — Other Ambulatory Visit: Payer: Self-pay

## 2023-02-07 DIAGNOSIS — Z171 Estrogen receptor negative status [ER-]: Secondary | ICD-10-CM

## 2023-02-07 DIAGNOSIS — Z510 Encounter for antineoplastic radiation therapy: Secondary | ICD-10-CM | POA: Diagnosis not present

## 2023-02-07 LAB — RAD ONC ARIA SESSION SUMMARY
Course Elapsed Days: 22
Plan Fractions Treated to Date: 1
Plan Prescribed Dose Per Fraction: 1.8 Gy
Plan Total Fractions Prescribed: 13
Plan Total Prescribed Dose: 23.4 Gy
Reference Point Dosage Given to Date: 28.8 Gy
Reference Point Session Dosage Given: 1.8 Gy
Session Number: 16

## 2023-02-07 MED ORDER — RADIAPLEXRX EX GEL: Freq: Once | CUTANEOUS | Status: AC

## 2023-02-10 ENCOUNTER — Other Ambulatory Visit: Payer: Self-pay

## 2023-02-10 ENCOUNTER — Encounter: Payer: Self-pay | Admitting: Physician Assistant

## 2023-02-10 ENCOUNTER — Ambulatory Visit
Admission: RE | Admit: 2023-02-10 | Discharge: 2023-02-10 | Disposition: A | Discharge status: Home / Self Care | Payer: Medicaid Other | Source: Ambulatory Visit | Attending: Radiation Oncology | Admitting: Radiation Oncology

## 2023-02-10 DIAGNOSIS — Z510 Encounter for antineoplastic radiation therapy: Secondary | ICD-10-CM | POA: Diagnosis not present

## 2023-02-10 LAB — RAD ONC ARIA SESSION SUMMARY
Course Elapsed Days: 25
Plan Fractions Treated to Date: 2
Plan Prescribed Dose Per Fraction: 1.8 Gy
Plan Total Fractions Prescribed: 13
Plan Total Prescribed Dose: 23.4 Gy
Reference Point Dosage Given to Date: 30.6 Gy
Reference Point Session Dosage Given: 1.8 Gy
Session Number: 17

## 2023-02-11 ENCOUNTER — Ambulatory Visit: Payer: Medicaid Other

## 2023-02-12 ENCOUNTER — Other Ambulatory Visit: Payer: Self-pay

## 2023-02-12 ENCOUNTER — Ambulatory Visit
Admission: RE | Admit: 2023-02-12 | Discharge: 2023-02-12 | Disposition: A | Discharge status: Home / Self Care | Payer: Medicaid Other | Source: Ambulatory Visit | Attending: Radiation Oncology | Admitting: Radiation Oncology

## 2023-02-12 DIAGNOSIS — Z171 Estrogen receptor negative status [ER-]: Secondary | ICD-10-CM

## 2023-02-12 DIAGNOSIS — Z510 Encounter for antineoplastic radiation therapy: Secondary | ICD-10-CM | POA: Diagnosis not present

## 2023-02-12 LAB — RAD ONC ARIA SESSION SUMMARY
Course Elapsed Days: 27
Plan Fractions Treated to Date: 3
Plan Prescribed Dose Per Fraction: 1.8 Gy
Plan Total Fractions Prescribed: 13
Plan Total Prescribed Dose: 23.4 Gy
Reference Point Dosage Given to Date: 32.4 Gy
Reference Point Session Dosage Given: 1.8 Gy
Session Number: 18

## 2023-02-12 MED ORDER — RADIAPLEXRX EX GEL: Freq: Once | CUTANEOUS | Status: AC

## 2023-02-12 MED ORDER — SILVER SULFADIAZINE 1 % EX CREA: TOPICAL_CREAM | CUTANEOUS | Status: DC | PRN

## 2023-02-13 ENCOUNTER — Other Ambulatory Visit: Payer: Self-pay

## 2023-02-13 ENCOUNTER — Ambulatory Visit
Admission: RE | Admit: 2023-02-13 | Discharge: 2023-02-13 | Disposition: A | Discharge status: Home / Self Care | Payer: Medicaid Other | Source: Ambulatory Visit | Attending: Radiation Oncology | Admitting: Radiation Oncology

## 2023-02-13 DIAGNOSIS — Z510 Encounter for antineoplastic radiation therapy: Secondary | ICD-10-CM | POA: Diagnosis not present

## 2023-02-13 LAB — RAD ONC ARIA SESSION SUMMARY
Course Elapsed Days: 28
Plan Fractions Treated to Date: 4
Plan Prescribed Dose Per Fraction: 1.8 Gy
Plan Total Fractions Prescribed: 13
Plan Total Prescribed Dose: 23.4 Gy
Reference Point Dosage Given to Date: 34.2 Gy
Reference Point Session Dosage Given: 1.8 Gy
Session Number: 19

## 2023-02-14 ENCOUNTER — Other Ambulatory Visit: Payer: Self-pay

## 2023-02-14 ENCOUNTER — Ambulatory Visit
Admission: RE | Admit: 2023-02-14 | Discharge: 2023-02-14 | Disposition: A | Discharge status: Home / Self Care | Payer: Medicaid Other | Source: Ambulatory Visit | Attending: Radiation Oncology | Admitting: Radiation Oncology

## 2023-02-14 ENCOUNTER — Ambulatory Visit: Payer: Medicaid Other | Admitting: Radiation Oncology

## 2023-02-14 ENCOUNTER — Other Ambulatory Visit: Payer: Self-pay | Admitting: Radiation Oncology

## 2023-02-14 DIAGNOSIS — Z510 Encounter for antineoplastic radiation therapy: Secondary | ICD-10-CM | POA: Diagnosis not present

## 2023-02-14 LAB — RAD ONC ARIA SESSION SUMMARY
Course Elapsed Days: 29
Plan Fractions Treated to Date: 5
Plan Prescribed Dose Per Fraction: 1.8 Gy
Plan Total Fractions Prescribed: 13
Plan Total Prescribed Dose: 23.4 Gy
Reference Point Dosage Given to Date: 36 Gy
Reference Point Session Dosage Given: 1.8 Gy
Session Number: 20

## 2023-02-14 MED ORDER — TRAMADOL HCL 50 MG PO TABS: 50.0000 mg | ORAL_TABLET | Freq: Four times a day (QID) | ORAL | 0 refills | Status: DC | PRN

## 2023-02-17 ENCOUNTER — Ambulatory Visit
Admission: RE | Admit: 2023-02-17 | Discharge: 2023-02-17 | Disposition: A | Discharge status: Home / Self Care | Payer: Medicaid Other | Source: Ambulatory Visit | Attending: Radiation Oncology | Admitting: Radiation Oncology

## 2023-02-17 ENCOUNTER — Other Ambulatory Visit: Payer: Self-pay

## 2023-02-17 DIAGNOSIS — Z510 Encounter for antineoplastic radiation therapy: Secondary | ICD-10-CM | POA: Diagnosis not present

## 2023-02-17 DIAGNOSIS — Z171 Estrogen receptor negative status [ER-]: Secondary | ICD-10-CM | POA: Insufficient documentation

## 2023-02-17 DIAGNOSIS — C50412 Malignant neoplasm of upper-outer quadrant of left female breast: Secondary | ICD-10-CM | POA: Diagnosis not present

## 2023-02-17 LAB — RAD ONC ARIA SESSION SUMMARY
Course Elapsed Days: 32
Plan Fractions Treated to Date: 6
Plan Prescribed Dose Per Fraction: 1.8 Gy
Plan Total Fractions Prescribed: 13
Plan Total Prescribed Dose: 23.4 Gy
Reference Point Dosage Given to Date: 37.8 Gy
Reference Point Session Dosage Given: 1.8 Gy
Session Number: 21

## 2023-02-18 ENCOUNTER — Ambulatory Visit
Admission: RE | Admit: 2023-02-18 | Discharge: 2023-02-18 | Disposition: A | Discharge status: Home / Self Care | Payer: Medicaid Other | Source: Ambulatory Visit | Attending: Radiation Oncology | Admitting: Radiation Oncology

## 2023-02-18 ENCOUNTER — Other Ambulatory Visit: Payer: Self-pay

## 2023-02-18 ENCOUNTER — Encounter: Payer: Self-pay | Admitting: Radiation Oncology

## 2023-02-18 VITALS — Resp 19 | Ht 70.0 in | Wt 230.0 lb

## 2023-02-18 DIAGNOSIS — Z510 Encounter for antineoplastic radiation therapy: Secondary | ICD-10-CM | POA: Diagnosis not present

## 2023-02-18 DIAGNOSIS — C50412 Malignant neoplasm of upper-outer quadrant of left female breast: Secondary | ICD-10-CM

## 2023-02-18 LAB — RAD ONC ARIA SESSION SUMMARY
Course Elapsed Days: 33
Plan Fractions Treated to Date: 7
Plan Prescribed Dose Per Fraction: 1.8 Gy
Plan Total Fractions Prescribed: 13
Plan Total Prescribed Dose: 23.4 Gy
Reference Point Dosage Given to Date: 39.6 Gy
Reference Point Session Dosage Given: 1.8 Gy
Session Number: 22

## 2023-02-18 MED ORDER — SONAFINE EX EMUL
1.0000 | Freq: Two times a day (BID) | CUTANEOUS | Status: DC
  Administered 2023-02-18: 1 via TOPICAL

## 2023-02-18 NOTE — Progress Notes (Addendum)
Gabrielle [name] was seen today for a skin check. She is a 32 y.o. woman undergoing adjuvant radiotherapy to there left breast for a history of Stage IIB, cT2N0M0 grade 3, triple negative invasive ductal carcinoma of the left breast. She has received 22 of the planned 33 fractions of radiation to her left breast and has developed increasing fullness in her breast as well as discomfort in the axilla and posterior to this. She was started on Ultram pain medication last Friday by Dr. Lisbeth Renshaw for this. She is using radioplex. She also mentions she is concerned about persistent weight loss, nausea, and regurgitation. She did not finish chemotherapy due to toxicities but her last infusion was in November of 2023, and her weight has consistently been dropping. She reports an appetite but that she soon after eating has regurgitation or emesis. She denies heartburn. She denies abdominal pain, headaches, visual or movement disturbances. No other complaints are verbalized.   Exam: Weight in clinic was 230 The patient is nontoxic appearing African American female in no distress.  She's alert and oriented x4 and appropriate throughout the examination. Cardiopulmonary assessment is negative for acute distress and she exhibits normal effort. Her left breast is large and pendulous. It has edema of the skin and she reports she's wearing a compressive sports style bra. There is erythema diffusely but also increased hyperpigmentation in the based of the left axilla without desquamation. Posterior to this there is some hyperpigmentation in her lateral chest wall but again the skin remains intact.      A/P The patient seems to be tolerating therapy with expected hyperpigmentation. We will switch her cream to sonafine to see if this is more comfortable for her, and she is encouraged to continue Ultram as needed. She should continue wearing her compression style bra. Regarding her weight loss and difficulty with nausea, I will reach out  to Dr. Burr Medico and also to nutrition. She was given supplements and coupons for ensure products today as well. We will continue plans with radiotherapy and she will see Dr. Tammi Klippel on Friday for her under treatment visit.      Gabrielle [name], PAC

## 2023-02-19 ENCOUNTER — Other Ambulatory Visit: Payer: Self-pay

## 2023-02-19 ENCOUNTER — Ambulatory Visit
Admission: RE | Admit: 2023-02-19 | Discharge: 2023-02-19 | Disposition: A | Discharge status: Home / Self Care | Payer: Medicaid Other | Source: Ambulatory Visit | Attending: Radiation Oncology | Admitting: Radiation Oncology

## 2023-02-19 DIAGNOSIS — Z510 Encounter for antineoplastic radiation therapy: Secondary | ICD-10-CM | POA: Diagnosis not present

## 2023-02-19 LAB — RAD ONC ARIA SESSION SUMMARY
Course Elapsed Days: 34
Plan Fractions Treated to Date: 8
Plan Prescribed Dose Per Fraction: 1.8 Gy
Plan Total Fractions Prescribed: 13
Plan Total Prescribed Dose: 23.4 Gy
Reference Point Dosage Given to Date: 41.4 Gy
Reference Point Session Dosage Given: 1.8 Gy
Session Number: 23

## 2023-02-20 ENCOUNTER — Other Ambulatory Visit: Payer: Self-pay | Admitting: Radiation Oncology

## 2023-02-20 ENCOUNTER — Encounter: Payer: Self-pay | Admitting: Radiation Oncology

## 2023-02-20 ENCOUNTER — Ambulatory Visit: Admission: RE | Admit: 2023-02-20 | Payer: Medicaid Other | Source: Ambulatory Visit

## 2023-02-20 DIAGNOSIS — Z510 Encounter for antineoplastic radiation therapy: Secondary | ICD-10-CM | POA: Diagnosis not present

## 2023-02-20 MED ORDER — OXYCODONE HCL 5 MG PO TABS: ORAL_TABLET | ORAL | 0 refills | Status: DC

## 2023-02-20 NOTE — Progress Notes (Signed)
The patient was seen again today and is having increasing swelling in her breast and progressive pain in the axilla with the dry pulling sensation of her breast pulling downward. She has used neosporin, sonafine, and ultram, and ibuprofen prn, but she is having a hard time sleeping due to the pain.  On examination she had dry desquamation forming in the axillary base, but no moist desquamation. Her breast is more edematous diffusely. She shows me her sports style bra which does not seem to have much support.   She attempted to proceed with radiation today but could not tolerate due the pulling of her axilla from the weight of her breast. We placed silvadene with a nonadherent dressing over the site and placed an abdominal binder in place of a bra to give more support. She also has a binder from her surgery at home. She can use Silvadene in addition to her sonafine, and was encouraged to try to wear the binder garment in place of a bra for the next few days, and we will try again tomorrow if she can tolerate this. A new prescription for oxycodone was given to the patient with intentions of discontinuing Ultram and using this at night for rest, she can also continue ibuprofen.     Carola Rhine, PAC

## 2023-02-21 ENCOUNTER — Ambulatory Visit: Payer: Medicaid Other | Admitting: Radiation Oncology

## 2023-02-21 ENCOUNTER — Ambulatory Visit
Admission: RE | Admit: 2023-02-21 | Discharge: 2023-02-21 | Disposition: A | Discharge status: Home / Self Care | Payer: Medicaid Other | Source: Ambulatory Visit | Attending: Radiation Oncology | Admitting: Radiation Oncology

## 2023-02-21 ENCOUNTER — Other Ambulatory Visit: Payer: Self-pay

## 2023-02-21 DIAGNOSIS — Z510 Encounter for antineoplastic radiation therapy: Secondary | ICD-10-CM | POA: Diagnosis not present

## 2023-02-21 LAB — RAD ONC ARIA SESSION SUMMARY
Course Elapsed Days: 36
Plan Fractions Treated to Date: 9
Plan Prescribed Dose Per Fraction: 1.8 Gy
Plan Total Fractions Prescribed: 13
Plan Total Prescribed Dose: 23.4 Gy
Reference Point Dosage Given to Date: 43.2 Gy
Reference Point Session Dosage Given: 1.8 Gy
Session Number: 24

## 2023-02-24 ENCOUNTER — Other Ambulatory Visit: Payer: Self-pay

## 2023-02-24 ENCOUNTER — Ambulatory Visit
Admission: RE | Admit: 2023-02-24 | Discharge: 2023-02-24 | Disposition: A | Discharge status: Home / Self Care | Payer: Medicaid Other | Source: Ambulatory Visit | Attending: Radiation Oncology | Admitting: Radiation Oncology

## 2023-02-24 ENCOUNTER — Encounter: Payer: Self-pay | Admitting: Radiation Oncology

## 2023-02-24 ENCOUNTER — Other Ambulatory Visit: Payer: Self-pay | Admitting: Radiation Oncology

## 2023-02-24 ENCOUNTER — Inpatient Hospital Stay: Payer: Medicaid Other | Attending: Hematology

## 2023-02-24 DIAGNOSIS — Z510 Encounter for antineoplastic radiation therapy: Secondary | ICD-10-CM | POA: Diagnosis not present

## 2023-02-24 DIAGNOSIS — Z171 Estrogen receptor negative status [ER-]: Secondary | ICD-10-CM

## 2023-02-24 LAB — RAD ONC ARIA SESSION SUMMARY
Course Elapsed Days: 39
Plan Fractions Treated to Date: 10
Plan Prescribed Dose Per Fraction: 1.8 Gy
Plan Total Fractions Prescribed: 13
Plan Total Prescribed Dose: 23.4 Gy
Reference Point Dosage Given to Date: 45 Gy
Reference Point Session Dosage Given: 1.8 Gy
Session Number: 25

## 2023-02-24 NOTE — Progress Notes (Signed)
Pt was seen at the machine today for evaluation due to persistent pain and breast edema. She has a 7x4 cm area of moist desquamation now at the axillary base, where last week this was only consistent with radiation dermatitis. She is using silvadene and telfa nonadherent dressings. We placed this again for her and her binder, and she will be referred to PT for evaluation of her breast edema.

## 2023-02-24 NOTE — Progress Notes (Signed)
Nutrition  Patient did not show up for nutrition appointment today.  Message sent to scheduling to offer another appointment.   Breeze Angell B. Freida Busman, RD, LDN Registered Dietitian [phone]

## 2023-02-25 ENCOUNTER — Other Ambulatory Visit: Payer: Self-pay

## 2023-02-25 ENCOUNTER — Ambulatory Visit: Payer: Medicaid Other

## 2023-02-25 ENCOUNTER — Ambulatory Visit
Admission: RE | Admit: 2023-02-25 | Discharge: 2023-02-25 | Disposition: A | Discharge status: Home / Self Care | Payer: Medicaid Other | Source: Ambulatory Visit | Attending: Radiation Oncology | Admitting: Radiation Oncology

## 2023-02-25 ENCOUNTER — Telehealth: Payer: Self-pay | Admitting: Hematology

## 2023-02-25 DIAGNOSIS — Z510 Encounter for antineoplastic radiation therapy: Secondary | ICD-10-CM | POA: Diagnosis not present

## 2023-02-25 LAB — RAD ONC ARIA SESSION SUMMARY
Course Elapsed Days: 40
Plan Fractions Treated to Date: 11
Plan Prescribed Dose Per Fraction: 1.8 Gy
Plan Total Fractions Prescribed: 13
Plan Total Prescribed Dose: 23.4 Gy
Reference Point Dosage Given to Date: 46.8 Gy
Reference Point Session Dosage Given: 1.8 Gy
Session Number: 26

## 2023-02-25 NOTE — Telephone Encounter (Signed)
Reached out to patient to reschedule missed appointment per 4/9 IB, left voicemail.

## 2023-02-26 ENCOUNTER — Other Ambulatory Visit: Payer: Self-pay

## 2023-02-26 ENCOUNTER — Ambulatory Visit
Admission: RE | Admit: 2023-02-26 | Discharge: 2023-02-26 | Disposition: A | Discharge status: Home / Self Care | Payer: Medicaid Other | Source: Ambulatory Visit | Attending: Radiation Oncology | Admitting: Radiation Oncology

## 2023-02-26 ENCOUNTER — Ambulatory Visit: Payer: Medicaid Other

## 2023-02-26 DIAGNOSIS — Z510 Encounter for antineoplastic radiation therapy: Secondary | ICD-10-CM | POA: Diagnosis not present

## 2023-02-26 LAB — RAD ONC ARIA SESSION SUMMARY
Course Elapsed Days: 41
Plan Fractions Treated to Date: 12
Plan Prescribed Dose Per Fraction: 1.8 Gy
Plan Total Fractions Prescribed: 13
Plan Total Prescribed Dose: 23.4 Gy
Reference Point Dosage Given to Date: 48.6 Gy
Reference Point Session Dosage Given: 1.8 Gy
Session Number: 27

## 2023-02-27 ENCOUNTER — Other Ambulatory Visit: Payer: Self-pay

## 2023-02-27 ENCOUNTER — Ambulatory Visit: Payer: Medicaid Other

## 2023-02-27 ENCOUNTER — Ambulatory Visit
Admission: RE | Admit: 2023-02-27 | Discharge: 2023-02-27 | Disposition: A | Discharge status: Home / Self Care | Payer: Medicaid Other | Source: Ambulatory Visit | Attending: Radiation Oncology | Admitting: Radiation Oncology

## 2023-02-27 DIAGNOSIS — Z510 Encounter for antineoplastic radiation therapy: Secondary | ICD-10-CM | POA: Diagnosis not present

## 2023-02-27 LAB — RAD ONC ARIA SESSION SUMMARY
Course Elapsed Days: 42
Plan Fractions Treated to Date: 13
Plan Prescribed Dose Per Fraction: 1.8 Gy
Plan Total Fractions Prescribed: 13
Plan Total Prescribed Dose: 23.4 Gy
Reference Point Dosage Given to Date: 50.4 Gy
Reference Point Session Dosage Given: 1.8 Gy
Session Number: 28

## 2023-02-28 ENCOUNTER — Other Ambulatory Visit: Payer: Self-pay

## 2023-02-28 ENCOUNTER — Ambulatory Visit: Payer: Medicaid Other

## 2023-02-28 ENCOUNTER — Ambulatory Visit
Admission: RE | Admit: 2023-02-28 | Discharge: 2023-02-28 | Disposition: A | Discharge status: Home / Self Care | Payer: Medicaid Other | Source: Ambulatory Visit | Attending: Radiation Oncology | Admitting: Radiation Oncology

## 2023-02-28 DIAGNOSIS — Z510 Encounter for antineoplastic radiation therapy: Secondary | ICD-10-CM | POA: Diagnosis not present

## 2023-02-28 LAB — RAD ONC ARIA SESSION SUMMARY
Course Elapsed Days: 43
Plan Fractions Treated to Date: 1
Plan Prescribed Dose Per Fraction: 2 Gy
Plan Total Fractions Prescribed: 5
Plan Total Prescribed Dose: 10 Gy
Reference Point Dosage Given to Date: 2 Gy
Reference Point Session Dosage Given: 2 Gy
Session Number: 29

## 2023-03-03 ENCOUNTER — Encounter: Payer: Self-pay | Admitting: *Deleted

## 2023-03-03 ENCOUNTER — Ambulatory Visit: Payer: Medicaid Other

## 2023-03-03 ENCOUNTER — Encounter: Payer: Medicaid Other | Admitting: Nutrition

## 2023-03-03 ENCOUNTER — Ambulatory Visit
Admission: RE | Admit: 2023-03-03 | Discharge: 2023-03-03 | Disposition: A | Discharge status: Home / Self Care | Payer: Medicaid Other | Source: Ambulatory Visit | Attending: Radiation Oncology | Admitting: Radiation Oncology

## 2023-03-03 ENCOUNTER — Inpatient Hospital Stay: Payer: Medicaid Other | Admitting: Hematology

## 2023-03-03 ENCOUNTER — Other Ambulatory Visit: Payer: Self-pay

## 2023-03-03 DIAGNOSIS — Z510 Encounter for antineoplastic radiation therapy: Secondary | ICD-10-CM | POA: Diagnosis not present

## 2023-03-03 DIAGNOSIS — Z171 Estrogen receptor negative status [ER-]: Secondary | ICD-10-CM

## 2023-03-03 LAB — RAD ONC ARIA SESSION SUMMARY
Course Elapsed Days: 46
Plan Fractions Treated to Date: 2
Plan Prescribed Dose Per Fraction: 2 Gy
Plan Total Fractions Prescribed: 5
Plan Total Prescribed Dose: 10 Gy
Reference Point Dosage Given to Date: 4 Gy
Reference Point Session Dosage Given: 2 Gy
Session Number: 30

## 2023-03-03 NOTE — Assessment & Plan Note (Deleted)
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 SLN biopsy on 05/14/22 was negative (0/4). -she started zoladex on 06/03/22. -she began chemotherapy with carboplatin/taxol and Keytruda on 06/14/22. Taxol switched to abraxane with C2D8 due to itching and mild numbness. 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 -she switched to [facility] with continuing Keytruda on 09/19/22. She tolerated poorly with persistent N/V, fatigue and anorexia, so treatment was stopped after one cycle -She started radiation on 01/16/2023

## 2023-03-03 NOTE — Progress Notes (Deleted)
[facility]   [phone] [phone]   Clinic Follow up Note   Patient Care Team: Norm Salt, PA as PCP - General (Physician Assistant) [name], RN as Oncology Nurse Navigator [name], RN as Oncology Nurse Navigator [name], MD as Consulting Physician (General Surgery) Malachy Mood, MD as Consulting Physician (Hematology) Dorothy Puffer, MD as Consulting Physician (Radiation Oncology) Ranee Gosselin, MD as Consulting Physician (Orthopedic Surgery) Raymondo Band as Physician Assistant (Hematology and Oncology)  Date of Service:  03/03/2023  CHIEF COMPLAINT: f/u of left breast cancer      CURRENT THERAPY:  Rande Lawman   ASSESSMENT: *** Gabrielle [name] is a 32 y.o. female with   No problem-specific Assessment & Plan notes found for this encounter.  ***   PLAN:    SUMMARY OF ONCOLOGIC HISTORY: Oncology History Overview Note   Cancer Staging  Malignant neoplasm of upper-outer quadrant of left breast in female, estrogen receptor negative (HCC) Staging form: Breast, AJCC 8th Edition - Clinical stage from 03/11/2022: Stage IIB (cT2, cN0, cM0, G3, ER-, PR-, HER2-) - Signed by Malachy Mood, MD on 03/19/2022    Malignant neoplasm of upper-outer quadrant of left breast in female, estrogen receptor negative  03/08/2022 Mammogram   CLINICAL DATA:  32 year old female presenting for evaluation of a palpable lump in the left breast which she feels has increased in size since she first identified it. Her mother was diagnosed with breast cancer within the last 6 months. She also has family history of breast cancer in multiple aunts and her maternal grandmother.   EXAM: DIGITAL DIAGNOSTIC BILATERAL MAMMOGRAM WITH TOMOSYNTHESIS AND CAD; ULTRASOUND LEFT BREAST LIMITED  IMPRESSION: 1. There is a suspicious 3.7 cm mass in the left breast at 12 o'clock.   2.  No evidence of left axillary lymphadenopathy.   3.  No  evidence of malignancy in the right breast.   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 Based on the biopsy, the carcinoma appears Nottingham grade 3 of 3 and measures 1.2 cm in greatest linear extent.  PROGNOSTIC INDICATORS Results: The tumor cells are NEGATIVE for Her2 (0). Estrogen Receptor: 0%, NEGATIVE Progesterone Receptor: 0%, NEGATIVE Proliferation Marker Ki67: 40%   03/15/2022 Initial Diagnosis   Malignant neoplasm of upper-outer quadrant of left breast in female, estrogen receptor negative (HCC)   03/28/2022 Genetic Testing   Negative hereditary cancer genetic testing: no pathogenic variants detected in [employer] BRCAPlus Panel or [employer] CustomNext-Cancer +RNAinsight Panel.  Report dates are 03/28/2022 and 03/31/2022. Marland Kitchen   The BRCAplus panel offered by [employer] and includes sequencing and deletion/duplication analysis for the following 8 genes: ATM, BRCA1, BRCA2, CDH1, CHEK2, PALB2, PTEN, and TP53.  The CustomNext-Cancer+RNAinsight panel offered by [employer] includes sequencing and rearrangement analysis for the following 47 genes:  APC, ATM, AXIN2, BARD1, BMPR1A, BRCA1, BRCA2, BRIP1, CDH1, CDK4, CDKN2A, CHEK2, DICER1, EPCAM, GREM1, HOXB13, MEN1, MLH1, MSH2, MSH3, MSH6, MUTYH, NBN, NF1, NF2, NTHL1, PALB2, PMS2, POLD1, POLE, PTEN, RAD51C, RAD51D, RECQL, RET, SDHA, SDHAF2, SDHB, SDHC, SDHD,  SMAD4, SMARCA4, STK11, TP53, TSC1, TSC2, and VHL.  RNA data is routinely analyzed for use in variant interpretation  for all genes.   05/14/2022 Cancer Staging   Staging form: Breast, AJCC 8th Edition - Pathologic stage from 05/14/2022: Stage IIA (pT2, pN0, cM0, G3, ER-, PR-, HER2-) - Signed by Malachy Mood, MD on 05/28/2022 Stage prefix: Initial diagnosis Histologic grading system: 3 grade system Residual tumor (R): R0 - None   06/13/2022 -  Chemotherapy   Patient is on Treatment Plan : BREAST Pembrolizumab (200) D1 + Carboplatin (1.5) D1,8,15 + Paclitaxel (80) D1,8,15 q21d X 4 cycles / Pembrolizumab (200) D1 + AC D1 q21d x 4 cycles     06/14/2022 - 07/05/2022 Chemotherapy   Patient is on Treatment Plan : BREAST Pembrolizumab (200) D1 + Carboplatin (5) D1 + Paclitaxel (80) D1,8,15 q21d X 4 cycles / Pembrolizumab (200) D1 + AC D1 q21d x 4 cycles        INTERVAL HISTORY: *** Gabrielle [name] is here for a follow up of left breast cancer    . She was last seen by me on 12/27/2022. She presents to the clinic {a      All other systems were reviewed with the patient and are negative.  MEDICAL HISTORY:  Past Medical History:  Diagnosis Date   Arthritis    Bilateral Knees   Cancer 03/11/2022   Breast   Family history of breast cancer 03/21/2022   Family history of prostate cancer 03/21/2022   Hypercholesteremia     SURGICAL HISTORY: Past Surgical History:  Procedure Laterality Date   AXILLARY SENTINEL NODE BIOPSY Left 05/14/2022   Procedure: LEFT AXILLARY SENTINEL NODE BIOPSY;  Surgeon: [name], MD;  Location: [facility];  Service: General;  Laterality: Left;  GEN w/ PEC BLOCK   BREAST BIOPSY Left 03/11/2022   BREAST LUMPECTOMY WITH AXILLARY LYMPH NODE BIOPSY Left 04/16/2022   Procedure: LEFT BREAST LUMPECTOMY WITH LEFT AXILLARY SENTINEL LYMPH NODE BIOPSY;  Surgeon: [name], MD;  Location: [facility];  Service: General;  Laterality: Left;   DENTAL SURGERY     PORTACATH PLACEMENT Right 04/16/2022   Procedure: INSERTION PORT-A-CATH;  Surgeon: [name], MD;  Location:  [facility];  Service: General;  Laterality: Right;    I have reviewed the social history and family history with the patient and they are unchanged from previous note.  ALLERGIES:  has No Known Allergies.  MEDICATIONS:  Current Outpatient Medications  Medication Sig Dispense Refill   acetaminophen (TYLENOL) 500 MG tablet Take 1,000 mg by mouth every 6 (six) hours as needed (pain.).     ALPRAZolam (XANAX) 0.5 MG tablet Take 1 tablet (0.5 mg total) by mouth as needed for anxiety. 30 tablet 0   aspirin-acetaminophen-caffeine (EXCEDRIN MIGRAINE) 250-250-65 MG tablet Take by mouth every 6 (six) hours as needed for headache.     calcium-vitamin D (OSCAL WITH D) 500-5 MG-MCG tablet Take 1 tablet by mouth 2 (two) times daily for 7 days. 14 tablet 0   gabapentin (NEURONTIN) 100 MG capsule Take 1-2 capsules (100-200 mg total) by mouth 2 (two) times daily. 60 capsule 0   ibuprofen (ADVIL) 200 MG tablet Take 200 mg by mouth every 8 (eight) hours as needed (pain).     methocarbamol (ROBAXIN) 500 MG tablet Take by mouth as needed.     mirtazapine (REMERON) 15 MG tablet Take 1 tablet (15 mg total) by mouth at bedtime. 30 tablet 2   ondansetron (ZOFRAN)  4 MG tablet Take 4 mg by mouth every 8 (eight) hours as needed for nausea or vomiting.     oxyCODONE (OXY IR/ROXICODONE) 5 MG immediate release tablet Take one tablet po q hs prn pain due to radiation dermatitis 20 tablet 0   pantoprazole (PROTONIX) 20 MG tablet Take 1 tablet (20 mg total) by mouth daily. 30 tablet 1   potassium chloride SA (KLOR-CON M) 20 MEQ tablet Take 1 tablet (20 mEq total) by mouth daily. 30 tablet 0   prochlorperazine (COMPAZINE) 10 MG tablet Take 1 tablet (10 mg total) by mouth every 6 (six) hours as needed for nausea or vomiting. 30 tablet 0   promethazine (PHENERGAN) 25 MG tablet Take 1 tablet (25 mg total) by mouth every 6 (six) hours as needed for nausea or vomiting. 30 tablet 0   zolpidem (AMBIEN) 5 MG tablet Take  1 tablet (5 mg total) by mouth at bedtime as needed for sleep. 30 tablet 0   No current facility-administered medications for this visit.   Facility-Administered Medications Ordered in Other Visits  Medication Dose Route Frequency Provider Last Rate Last Admin   diphenhydrAMINE (BENADRYL) 50 MG/ML injection            famotidine (PEPCID) 20-0.9 MG/50ML-% IVPB             PHYSICAL EXAMINATION: ECOG PERFORMANCE STATUS: {CHL ONC ECOG PS:[phone]}  There were no vitals filed for this visit. Wt Readings from Last 3 Encounters:  02/18/23 230 lb (104.3 kg)  01/01/23 243 lb (110.2 kg)  12/27/22 249 lb 1.6 oz (113 kg)    {Only keep what was examined. If exam not performed, can use .CEXAM } GENERAL:alert, no distress and comfortable SKIN: skin color, texture, turgor are normal, no rashes or significant lesions EYES: normal, Conjunctiva are pink and non-injected, sclera clear {OROPHARYNX:no exudate, no erythema and lips, buccal mucosa, and tongue normal}  NECK: supple, thyroid normal size, non-tender, without nodularity LYMPH:  no palpable lymphadenopathy in the cervical, axillary {or inguinal} LUNGS: clear to auscultation and percussion with normal breathing effort HEART: regular rate & rhythm and no murmurs and no lower extremity edema ABDOMEN:abdomen soft, non-tender and normal bowel sounds Musculoskeletal:no cyanosis of digits and no clubbing  NEURO: alert & oriented x 3 with fluent speech, no focal motor/sensory deficits  LABORATORY DATA:  I have reviewed the data as listed    Latest Ref Rng & Units 12/27/2022    1:38 PM 10/31/2022   11:35 AM 10/24/2022   11:14 AM  CBC  WBC 4.0 - 10.5 K/uL 6.0  18.4  15.6   Hemoglobin 12.0 - 15.0 g/dL 16.1  09.6  9.5   Hematocrit 36.0 - 46.0 % 38.6  32.2  29.2   Platelets 150 - 400 K/uL 260  228  300         Latest Ref Rng & Units 12/27/2022    1:38 PM 10/31/2022   11:35 AM 10/24/2022   11:14 AM  CMP  Glucose 70 - 99 mg/dL 045  94  83    BUN 6 - 20 mg/dL Creatinine 0.44 - 1.00 mg/dL 4.09  8.11  9.14   Sodium 135 - 145 mmol/L 136  138  138   Potassium 3.5 - 5.1 mmol/L 3.4  4.1  2.8   Chloride 98 - 111 mmol/L 101  101  100   CO2 22 - 32 mmol/L 29  32  30   Calcium 8.9 -  10.3 mg/dL 16.1  09.6  7.1    NSERUM   Total Protein 6.5 - 8.1 g/dL 7.2  6.2  6.1   Total Bilirubin 0.3 - 1.2 mg/dL 0.7  0.4  0.4   Alkaline Phos 38 - 126 U/L 66  105  80   AST 15 - 41 U/L ALT 0 - 44 U/L RADIOGRAPHIC STUDIES: I have personally reviewed the radiological images as listed and agreed with the findings in the report. No results found.    No orders of the defined types were placed in this encounter.  All questions were answered. The patient knows to call the clinic with any problems, questions or concerns. No barriers to learning was detected. The total time spent in the appointment was {CHL ONC TIME VISIT - [id number]}.     Salome Holmes, CMA 03/03/2023   I, Monica Martinez, CMA, am acting as scribe for Malachy Mood, MD.   {Add scribe attestation statement}

## 2023-03-04 ENCOUNTER — Ambulatory Visit: Payer: Medicaid Other

## 2023-03-04 ENCOUNTER — Ambulatory Visit
Admission: RE | Admit: 2023-03-04 | Discharge: 2023-03-04 | Disposition: A | Discharge status: Home / Self Care | Payer: Medicaid Other | Source: Ambulatory Visit | Attending: Radiation Oncology | Admitting: Radiation Oncology

## 2023-03-04 ENCOUNTER — Other Ambulatory Visit: Payer: Self-pay

## 2023-03-04 DIAGNOSIS — Z510 Encounter for antineoplastic radiation therapy: Secondary | ICD-10-CM | POA: Diagnosis not present

## 2023-03-04 LAB — RAD ONC ARIA SESSION SUMMARY
Course Elapsed Days: 47
Plan Fractions Treated to Date: 3
Plan Prescribed Dose Per Fraction: 2 Gy
Plan Total Fractions Prescribed: 5
Plan Total Prescribed Dose: 10 Gy
Reference Point Dosage Given to Date: 6 Gy
Reference Point Session Dosage Given: 2 Gy
Session Number: 31

## 2023-03-05 ENCOUNTER — Other Ambulatory Visit: Payer: Self-pay

## 2023-03-05 ENCOUNTER — Ambulatory Visit: Payer: Medicaid Other

## 2023-03-05 ENCOUNTER — Inpatient Hospital Stay: Payer: Medicaid Other | Admitting: Dietician

## 2023-03-05 ENCOUNTER — Ambulatory Visit
Admission: RE | Admit: 2023-03-05 | Discharge: 2023-03-05 | Disposition: A | Discharge status: Home / Self Care | Payer: Medicaid Other | Source: Ambulatory Visit | Attending: Radiation Oncology | Admitting: Radiation Oncology

## 2023-03-05 DIAGNOSIS — Z510 Encounter for antineoplastic radiation therapy: Secondary | ICD-10-CM | POA: Diagnosis not present

## 2023-03-05 LAB — RAD ONC ARIA SESSION SUMMARY
Course Elapsed Days: 48
Plan Fractions Treated to Date: 4
Plan Prescribed Dose Per Fraction: 2 Gy
Plan Total Fractions Prescribed: 5
Plan Total Prescribed Dose: 10 Gy
Reference Point Dosage Given to Date: 8 Gy
Reference Point Session Dosage Given: 2 Gy
Session Number: 32

## 2023-03-05 NOTE — Progress Notes (Signed)
Patient did not show for nutrition appointment. This is patient second missed appointment. Noted final radiation planned 4/18.

## 2023-03-06 ENCOUNTER — Other Ambulatory Visit: Payer: Self-pay

## 2023-03-06 ENCOUNTER — Ambulatory Visit
Admission: RE | Admit: 2023-03-06 | Discharge: 2023-03-06 | Disposition: A | Discharge status: Home / Self Care | Payer: Medicaid Other | Source: Ambulatory Visit | Attending: Radiation Oncology | Admitting: Radiation Oncology

## 2023-03-06 ENCOUNTER — Encounter: Payer: Self-pay | Admitting: Radiation Oncology

## 2023-03-06 ENCOUNTER — Ambulatory Visit: Payer: Medicaid Other

## 2023-03-06 DIAGNOSIS — Z510 Encounter for antineoplastic radiation therapy: Secondary | ICD-10-CM | POA: Diagnosis not present

## 2023-03-06 LAB — RAD ONC ARIA SESSION SUMMARY
Course Elapsed Days: 49
Plan Fractions Treated to Date: 5
Plan Prescribed Dose Per Fraction: 2 Gy
Plan Total Fractions Prescribed: 5
Plan Total Prescribed Dose: 10 Gy
Reference Point Dosage Given to Date: 10 Gy
Reference Point Session Dosage Given: 2 Gy
Session Number: 33

## 2023-03-07 ENCOUNTER — Encounter: Payer: Self-pay | Admitting: Radiation Oncology

## 2023-03-07 ENCOUNTER — Ambulatory Visit: Payer: Medicaid Other

## 2023-03-10 NOTE — Radiation Completion Notes (Signed)
  Radiation Oncology         [phone] ________________________________  Name: Gabrielle [name] MRN: [id number]  Date of Service: 03/06/2023  DOB: Jul 30, 1991  End of Treatment Note                           RADIATION ONCOLOGY END OF TREATMENT NOTE     Diagnosis: Stage IIB, cT2N0M0 grade 3, triple negative invasive ductal carcinoma of the left breast   Intent: Curative     ==========DELIVERED PLANS==========  First Treatment Date: 2023-01-16 - Last Treatment Date: 2023-03-06   Plan Name: Breast_L_BH Site: Breast, Left Technique: 3D Mode: Photon Dose Per Fraction: 1.8 Gy Prescribed Dose (Delivered / Prescribed): 27 Gy / 27 Gy Prescribed Fxs (Delivered / Prescribed): 15 / 15   Plan Name: Breast_L:1 Site: Breast, Left Technique: 3D Mode: Photon Dose Per Fraction: 1.8 Gy Prescribed Dose (Delivered / Prescribed): 23.4 Gy / 23.4 Gy Prescribed Fxs (Delivered / Prescribed): 13 / 13   Plan Name: Breast_L_Bst Site: Breast, Left Technique: 3D Mode: Photon Dose Per Fraction: 2 Gy Prescribed Dose (Delivered / Prescribed): 10 Gy / 10 Gy Prescribed Fxs (Delivered / Prescribed): 5 / 5     ==========ON TREATMENT VISIT DATES========== 2023-01-16, 2023-01-24, 2023-01-31, 2023-02-07, 2023-02-14, 2023-02-21, 2023-02-28, 2023-03-06   See weekly On Treatment Notes is Epic for details. The patient tolerated radiation. She developed fatigue and anticipated skin changes in the treatment field.   The patient will receive a call in about one month from the radiation oncology department. She will continue follow up with Dr. Mosetta Putt as well.      Osker Mason, PAC

## 2023-03-17 ENCOUNTER — Encounter: Payer: Self-pay | Admitting: *Deleted

## 2023-03-17 ENCOUNTER — Telehealth: Payer: Self-pay

## 2023-03-17 NOTE — Telephone Encounter (Signed)
Open in Error.

## 2023-03-18 ENCOUNTER — Telehealth: Payer: Self-pay | Admitting: Hematology

## 2023-03-18 ENCOUNTER — Encounter: Payer: Self-pay | Admitting: *Deleted

## 2023-03-18 NOTE — Telephone Encounter (Signed)
Contacted patient to scheduled appointments. Left message with appointment details and a call back number if patient had any questions or could not accommodate the time we provided.   

## 2023-03-19 ENCOUNTER — Encounter: Payer: Self-pay | Admitting: Nurse Practitioner

## 2023-03-19 ENCOUNTER — Inpatient Hospital Stay: Payer: Medicaid Other | Attending: Hematology | Admitting: Nurse Practitioner

## 2023-03-19 ENCOUNTER — Other Ambulatory Visit: Payer: Self-pay

## 2023-03-19 VITALS — BP 116/80 | HR 103 | Temp 98.0°F | Resp 20 | Wt 207.0 lb

## 2023-03-19 DIAGNOSIS — M2550 Pain in unspecified joint: Secondary | ICD-10-CM | POA: Insufficient documentation

## 2023-03-19 DIAGNOSIS — Z7952 Long term (current) use of systemic steroids: Secondary | ICD-10-CM | POA: Diagnosis not present

## 2023-03-19 DIAGNOSIS — Z79899 Other long term (current) drug therapy: Secondary | ICD-10-CM | POA: Insufficient documentation

## 2023-03-19 DIAGNOSIS — R112 Nausea with vomiting, unspecified: Secondary | ICD-10-CM | POA: Diagnosis not present

## 2023-03-19 DIAGNOSIS — Z803 Family history of malignant neoplasm of breast: Secondary | ICD-10-CM | POA: Insufficient documentation

## 2023-03-19 DIAGNOSIS — Z171 Estrogen receptor negative status [ER-]: Secondary | ICD-10-CM | POA: Diagnosis not present

## 2023-03-19 DIAGNOSIS — C50412 Malignant neoplasm of upper-outer quadrant of left female breast: Secondary | ICD-10-CM | POA: Insufficient documentation

## 2023-03-19 DIAGNOSIS — R1115 Cyclical vomiting syndrome unrelated to migraine: Secondary | ICD-10-CM | POA: Insufficient documentation

## 2023-03-19 DIAGNOSIS — E7800 Pure hypercholesterolemia, unspecified: Secondary | ICD-10-CM | POA: Diagnosis not present

## 2023-03-19 NOTE — Progress Notes (Cosign Needed Addendum)
Patient Care Team: Norm Salt, [place] as PCP - General (Physician Assistant) [name], RN as Oncology Nurse Navigator [name], RN as Oncology Nurse Navigator [name], MD as Consulting Physician (General Surgery) Malachy Mood, MD as Consulting Physician (Hematology) Dorothy Puffer, MD as Consulting Physician (Radiation Oncology) Ranee Gosselin, MD as Consulting Physician (Orthopedic Surgery) Raymondo Band as Physician Assistant (Hematology and Oncology)   CHIEF COMPLAINT: Follow up left breast cancer   Oncology History Overview Note   Cancer Staging  Malignant neoplasm of upper-outer quadrant of left breast in female, estrogen receptor negative (HCC) Staging form: Breast, AJCC 8th Edition - Clinical stage from 03/11/2022: Stage IIB (cT2, cN0, cM0, G3, ER-, PR-, HER2-) - Signed by Malachy Mood, MD on 03/19/2022    Malignant neoplasm of upper-outer quadrant of left breast in female, estrogen receptor negative (HCC)  03/08/2022 Mammogram   CLINICAL DATA:  32 year old female presenting for evaluation of a palpable lump in the left breast which she feels has increased in size since she first identified it. Her mother was diagnosed with breast cancer within the last 6 months. She also has family history of breast cancer in multiple aunts and her maternal grandmother.   EXAM: DIGITAL DIAGNOSTIC BILATERAL MAMMOGRAM WITH TOMOSYNTHESIS AND CAD; ULTRASOUND LEFT BREAST LIMITED  IMPRESSION: 1. There is a suspicious 3.7 cm mass in the left breast at 12 o'clock.   2.  No evidence of left axillary lymphadenopathy.   3.  No evidence of malignancy in the right breast.   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 Based on the biopsy, the carcinoma appears Nottingham grade 3 of 3 and measures 1.2 cm in greatest linear extent.  PROGNOSTIC INDICATORS Results: The tumor cells are NEGATIVE for Her2 (0). Estrogen Receptor: 0%, NEGATIVE Progesterone Receptor: 0%, NEGATIVE Proliferation Marker Ki67: 40%   03/15/2022 Initial Diagnosis   Malignant neoplasm of upper-outer quadrant of left breast in female, estrogen receptor negative (HCC)   03/28/2022 Genetic Testing   Negative hereditary cancer genetic testing: no pathogenic variants detected in [facility] BRCAPlus Panel or [employer] CustomNext-Cancer +RNAinsight Panel.  Report dates are 03/28/2022 and 03/31/2022. Marland Kitchen   The BRCAplus panel offered by [employer] and includes sequencing and deletion/duplication analysis for the following 8 genes: ATM, BRCA1, BRCA2, CDH1, CHEK2, PALB2, PTEN, and TP53.  The CustomNext-Cancer+RNAinsight panel offered by [employer] includes sequencing and rearrangement analysis for the following 47 genes:  APC, ATM, AXIN2, BARD1, BMPR1A, BRCA1, BRCA2, BRIP1, CDH1, CDK4, CDKN2A, CHEK2, DICER1, EPCAM, GREM1, HOXB13, MEN1, MLH1, MSH2, MSH3, MSH6, MUTYH, NBN, NF1, NF2, NTHL1, PALB2, PMS2, POLD1, POLE, PTEN, RAD51C, RAD51D, RECQL, RET, SDHA, SDHAF2, SDHB, SDHC, SDHD, SMAD4, SMARCA4, STK11, TP53, TSC1, TSC2, and VHL.  RNA data is routinely analyzed for use in variant interpretation for all genes.   05/14/2022 Cancer Staging   Staging form: Breast, AJCC 8th Edition - Pathologic stage from 05/14/2022: Stage IIA (pT2, pN0, cM0, G3, ER-, PR-, HER2-) - Signed by Malachy Mood, MD on 05/28/2022 Stage prefix: Initial diagnosis Histologic grading system:  3 grade system Residual tumor (R): R0 - None   06/13/2022 -  Chemotherapy    Patient is on Treatment Plan : BREAST Pembrolizumab (200) D1 + Carboplatin (1.5) D1,8,15 + Paclitaxel (80) D1,8,15 q21d X 4 cycles / Pembrolizumab (200) D1 + AC D1 q21d x 4 cycles     06/14/2022 - 07/05/2022 Chemotherapy   Patient is on Treatment Plan : BREAST Pembrolizumab (200) D1 + Carboplatin (5) D1 + Paclitaxel (80) D1,8,15 q21d X 4 cycles / Pembrolizumab (200) D1 + AC D1 q21d x 4 cycles        CURRENT THERAPY: S/p surgery, adjuvant chemo, and radiation completed 03/06/23; on surveillance   INTERVAL HISTORY Ms. Broome returns for follow up as scheduled. Last chemo AC/keytruda 09/19/22 and last seen by Dr. Mosetta Putt 12/27/22. Bone scan 01/09/23 was benign. She completed radiation 4/18, skin is still healing. She reports nausea with vomiting at least 3 times daily every day since last visit. She could not eat during chemo or radiation. She can swallow food and it stays down, but has had frequent emesis. She only recently found anti-emetics at home and starting taking phenergan 1-2 times daily which is helping. Last emesis 4/29. Bowels moving. She has low stamina, but can care for herself, children, and helping with sister's new baby. She is weak but no fall. Body aches resolved except for mild knee pain. Denies other bone pain. Denies headache, dizziness.   ROS  All other systems reviewed and negative   Past Medical History:  Diagnosis Date   Arthritis    Bilateral Knees   Cancer (HCC) 03/11/2022   Breast   Family history of breast cancer 03/21/2022   Family history of prostate cancer 03/21/2022   Hypercholesteremia      Past Surgical History:  Procedure Laterality Date   AXILLARY SENTINEL NODE BIOPSY Left 05/14/2022   Procedure: LEFT AXILLARY SENTINEL NODE BIOPSY;  Surgeon: [name], MD;  Location: [facility] OR;  Service: General;  Laterality: Left;  GEN w/ PEC BLOCK   BREAST BIOPSY Left 03/11/2022   BREAST LUMPECTOMY WITH AXILLARY LYMPH NODE BIOPSY Left 04/16/2022   Procedure: LEFT BREAST  LUMPECTOMY WITH LEFT AXILLARY SENTINEL LYMPH NODE BIOPSY;  Surgeon: [name], MD;  Location: [facility];  Service: General;  Laterality: Left;   DENTAL SURGERY     PORTACATH PLACEMENT Right 04/16/2022   Procedure: INSERTION PORT-A-CATH;  Surgeon: [name], MD;  Location: [facility];  Service: General;  Laterality: Right;     Outpatient Encounter Medications as of 03/19/2023  Medication Sig   acetaminophen (TYLENOL) 500 MG tablet Take 1,000 mg by mouth every 6 (six) hours as needed (pain.).   ALPRAZolam (XANAX) 0.5 MG tablet Take 1 tablet (0.5 mg total) by mouth as needed for anxiety.   aspirin-acetaminophen-caffeine (EXCEDRIN MIGRAINE) 250-250-65 MG tablet Take by mouth every 6 (six) hours as needed for headache.   dexamethasone (DECADRON) 4 MG tablet Take 1 tablet by mouth daily.   gabapentin (NEURONTIN) 100 MG capsule Take 1-2 capsules (100-200 mg total) by mouth 2 (two) times daily.   ibuprofen (ADVIL) 200 MG tablet Take 200 mg by mouth every 8 (eight) hours as needed (pain).   methocarbamol (ROBAXIN) 500 MG tablet Take by mouth as needed.   mirtazapine (REMERON) 15 MG tablet Take 1 tablet (15 mg total) by mouth at bedtime.   ondansetron (ZOFRAN) 4 MG tablet Take 4 mg by mouth every 8 (eight) hours as needed for nausea or vomiting.  oxyCODONE (OXY IR/ROXICODONE) 5 MG immediate release tablet Take one tablet po q hs prn pain due to radiation dermatitis   pantoprazole (PROTONIX) 20 MG tablet Take 1 tablet (20 mg total) by mouth daily.   pantoprazole (PROTONIX) 20 MG tablet Take 1 tablet by mouth daily.   potassium chloride SA (KLOR-CON M) 20 MEQ tablet Take 1 tablet (20 mEq total) by mouth daily.   prochlorperazine (COMPAZINE) 10 MG tablet Take 1 tablet (10 mg total) by mouth every 6 (six) hours as needed for nausea or vomiting.   promethazine (PHENERGAN) 25 MG tablet Take 1 tablet (25 mg total) by mouth every 6 (six) hours as needed for nausea  or vomiting.   zolpidem (AMBIEN) 5 MG tablet Take 1 tablet (5 mg total) by mouth at bedtime as needed for sleep.   calcium-vitamin D (OSCAL WITH D) 500-5 MG-MCG tablet Take 1 tablet by mouth 2 (two) times daily for 7 days.   Facility-Administered Encounter Medications as of 03/19/2023  Medication   diphenhydrAMINE (BENADRYL) 50 MG/ML injection   famotidine (PEPCID) 20-0.9 MG/50ML-% IVPB     Today's Vitals   03/19/23 1003 03/19/23 1015  BP: 116/80   Pulse: (!) 103   Resp: 20   Temp: 98 F (36.7 C)   SpO2: 100%   Weight: 207 lb (93.9 kg)   PainSc:  0-No pain   Body mass index is 29.7 kg/m.   PHYSICAL EXAM GENERAL:alert, no distress and comfortable SKIN: no rash  EYES: sclera clear NECK: without mass LYMPH:  no palpable cervical or supraclavicular lymphadenopathy  LUNGS: clear with normal breathing effort HEART: regular rate & rhythm, no lower extremity edema ABDOMEN: abdomen soft, non-tender and normal bowel sounds NEURO: alert & oriented x 3 with fluent speech, no focal motor/sensory deficits Breast exam: s/p left lumpectomy and radiation. Mild LE and hyperpigmentation with some peeling. No ulcers. No palpable mass in either breast or axilla that I could appreciate PAC without erythema    CBC    Component Value Date/Time   WBC 6.0 12/27/2022 1338   RBC 4.68 12/27/2022 1338   HGB 13.0 12/27/2022 1338   HGB 9.5 (L) 10/24/2022 1114   HCT 38.6 12/27/2022 1338   PLT 260 12/27/2022 1338   PLT 300 10/24/2022 1114   MCV 82.5 12/27/2022 1338   MCH 27.8 12/27/2022 1338   MCHC 33.7 12/27/2022 1338   RDW 15.0 12/27/2022 1338   LYMPHSABS 3.0 12/27/2022 1338   MONOABS 0.4 12/27/2022 1338   EOSABS 0.3 12/27/2022 1338   BASOSABS 0.0 12/27/2022 1338     CMP     Component Value Date/Time   NA 136 12/27/2022 1338   K 3.4 (L) 12/27/2022 1338   CL 101 12/27/2022 1338   CO2 29 12/27/2022 1338   GLUCOSE 106 (H) 12/27/2022 1338   BUN 11 12/27/2022 1338   CREATININE 0.92  12/27/2022 1338   CREATININE 0.95 10/24/2022 1114   CALCIUM 11.5 (H) 12/27/2022 1338   CALCIUM NSERUM 10/24/2022 1114   PROT 7.2 12/27/2022 1338   ALBUMIN 4.0 12/27/2022 1338   AST 28 12/27/2022 1338   AST 28 10/24/2022 1114   ALT 11 12/27/2022 1338   ALT 19 10/24/2022 1114   ALKPHOS 66 12/27/2022 1338   BILITOT 0.7 12/27/2022 1338   BILITOT 0.4 10/24/2022 1114   GFRNONAA >60 12/27/2022 1338   GFRNONAA >60 10/24/2022 1114     ASSESSMENT & PLAN:Gabrielle [name] is a 32 y.o. 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 SLN biopsy on 05/14/22 was negative (0/4). -she started zoladex on 06/03/22. -she began chemotherapy with carboplatin/taxol and Keytruda on 06/14/22. Taxol switched to abraxane with C2D8 due to itching and mild numbness. 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 -she switched to [facility] with continuing Keytruda on 09/19/22. She tolerated poorly with persistent N/V, fatigue and anorexia, so treatment was stopped after one cycle -She subsequently developed diffuse arthralgia which has significantly impacted her daily activities, this could be related to immunotherapy Keytruda.  It was stopped. Last dose AC/keytruda 09/19/22 -Due to her persistent fatigue, low appetite and weight loss, we recommended restaging with bone scan/CT CAP, bone scan was negative 2/22; she opted not to do the CT CAP -S/p radiation 01/16/23 - 03/06/2023, she developed skin toxicity and moist desquamation -Ms. Choe appears deconditioned and frail. She developed severe n/v since chemo, which persisted through radiation, with profound weight loss ~40 lbs since last  visit in 2/224. Recently started phenergan which is helpful. Exam is essentially benign. No neuro changes. Brain MRI 08/2022 was negative  -We discussed this degree/duration of n/v is not typical of chemo/immunotherapy or radiation.  -We recommend obtain CT CAP to r/o recurrence. She does not want to use port, and agrees with scan -If CT Is negative will ask surgery to remove her port -Will see her after, to review results, and continue surveillance. B/l mammo due in 03/2023 but will let her recover little more from radiation -Pt seen with Dr. Mosetta Putt  2.  Diffuse arthralgia and a dry mouth -Started after last cycle AC/keytruda, her arthralgia has significantly impacted her daily activities.  No significant joint edema exam today. -Possible related to [facility], which we stopped 09/2022 -joint pain nearly resolved, except mild knee pain.  -Rheum scheduled later this month      PLAN: -Previous imaging and recent notes reviewed -Continue supportive care for n/v, weight loss, fatigue -Keep rheumatology consult as scheduled -Lab (no port flush) and CT CAP in 1-2 weeks -F/up after scan   Orders Placed This Encounter  Procedures   CT CHEST ABDOMEN PELVIS W CONTRAST    Standing Status:   Future    Standing Expiration Date:   03/18/2024    Order Specific Question:   If indicated for the ordered procedure, I authorize the administration of contrast media per Radiology protocol    Answer:   Yes    Order Specific Question:   Does the patient have a contrast media/X-ray dye allergy?    Answer:   No    Order Specific Question:   Is patient pregnant?    Answer:   No    Order Specific Question:   Preferred imaging location?    Answer:   [facility]    Order Specific Question:   If indicated for the ordered procedure, I authorize the administration of oral contrast media per Radiology protocol    Answer:   Yes   CA 27.29    Standing Status:   Standing    Number of Occurrences:   1     Standing Expiration Date:   03/18/2024      All questions were answered. The patient knows to call  the clinic with any problems, questions or concerns. No barriers to learning were detected.   Santiago Glad, NP-C 03/19/2023  Addendum  I have seen the patient, examined her. I agree with the assessment and and plan and have edited the notes.   Tamrah has completed adjuvant radiation.  She still has intermittent nausea, low appetite, and weight loss.  She also appears to be slow responding to questions with slow speech, but oriented. I recommend CT scan to rule out cancer recurrence given her multiple symptoms especially weight loss.  Will see her back after CT scan.  We discussed nutrition supplement and increase activity levels.  She agrees to try.  Malachy Mood MD 03/19/2023

## 2023-03-20 ENCOUNTER — Encounter: Payer: Self-pay | Admitting: Hematology

## 2023-03-20 ENCOUNTER — Telehealth: Payer: Self-pay

## 2023-03-20 NOTE — Telephone Encounter (Signed)
Called patient to make her aware of her appointment for her CT Scan 5/10 at 5pm to start contrast start scan at 7pm. Needs to be NPO after 3pm. Need to be at [facility] for labs at 4pm before going to the scan. Patient is aware and stated she understood the schedule.

## 2023-03-21 ENCOUNTER — Other Ambulatory Visit: Payer: Self-pay

## 2023-03-26 ENCOUNTER — Other Ambulatory Visit: Payer: Self-pay

## 2023-03-26 ENCOUNTER — Other Ambulatory Visit: Payer: Self-pay | Admitting: Radiation Oncology

## 2023-03-27 ENCOUNTER — Encounter: Payer: Self-pay | Admitting: Hematology

## 2023-03-28 ENCOUNTER — Ambulatory Visit (HOSPITAL_BASED_OUTPATIENT_CLINIC_OR_DEPARTMENT_OTHER): Admission: RE | Admit: 2023-03-28 | Payer: Medicaid Other | Source: Ambulatory Visit

## 2023-03-28 ENCOUNTER — Inpatient Hospital Stay: Payer: Medicaid Other

## 2023-03-30 ENCOUNTER — Other Ambulatory Visit (HOSPITAL_BASED_OUTPATIENT_CLINIC_OR_DEPARTMENT_OTHER): Payer: Medicaid Other

## 2023-03-30 NOTE — Assessment & Plan Note (Deleted)
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 SLN biopsy on 05/14/22 was negative (0/4). -she started zoladex on 06/03/22. -she began chemotherapy with carboplatin/taxol and Keytruda on 06/14/22. Taxol switched to abraxane with C2D8 due to itching and mild numbness. 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 -she switched to [facility] with continuing Keytruda on 09/19/22. She tolerated poorly with persistent N/V, fatigue and anorexia, so treatment was stopped after one cycle -She subsequently developed diffuse arthralgia which has significantly impacted her daily activities, this could be related to immunotherapy Keytruda.  It was stopped. Last dose AC/keytruda 09/19/22 -Due to her persistent fatigue, low appetite and weight loss, we recommended restaging with bone scan/CT CAP, bone scan was negative 2/22; she opted not to do the CT CAP -S/p radiation 01/16/23 - 03/06/2023 -she was scheduled for CT on 5/10 but did not show up

## 2023-03-31 ENCOUNTER — Inpatient Hospital Stay: Payer: Medicaid Other | Admitting: Hematology

## 2023-03-31 DIAGNOSIS — C50412 Malignant neoplasm of upper-outer quadrant of left female breast: Secondary | ICD-10-CM

## 2023-03-31 NOTE — Progress Notes (Deleted)
[facility]   Telephone:([phone] [phone]   Clinic Follow up Note   Patient Care Team: Norm Salt, PA as PCP - General (Physician Assistant) [name], RN as Oncology Nurse Navigator [name], RN as Oncology Nurse Navigator [name], MD as Consulting Physician (General Surgery) Malachy Mood, MD as Consulting Physician (Hematology) Dorothy Puffer, MD as Consulting Physician (Radiation Oncology) Ranee Gosselin, MD as Consulting Physician (Orthopedic Surgery) Raymondo Band as Physician Assistant (Hematology and Oncology)  Date of Service:  03/31/2023  CHIEF COMPLAINT: f/u of left breast cancer     CURRENT THERAPY:    S/p surgery, adjuvant chemo, and radiation completed 03/06/23; on surveillance       ASSESSMENT: *** Gabrielle [name] is a 32 y.o. female with   No problem-specific Assessment & Plan notes found for this encounter.  ***   PLAN:     SUMMARY OF ONCOLOGIC HISTORY: Oncology History Overview Note   Cancer Staging  Malignant neoplasm of upper-outer quadrant of left breast in female, estrogen receptor negative (HCC) Staging form: Breast, AJCC 8th Edition - Clinical stage from 03/11/2022: Stage IIB (cT2, cN0, cM0, G3, ER-, PR-, HER2-) - Signed by Malachy Mood, MD on 03/19/2022    Malignant neoplasm of upper-outer quadrant of left breast in female, estrogen receptor negative (HCC)  03/08/2022 Mammogram   CLINICAL DATA:  32 year old female presenting for evaluation of a palpable lump in the left breast which she feels has increased in size since she first identified it. Her mother was diagnosed with breast cancer within the last 6 months. She also has family history of breast cancer in multiple aunts and her maternal grandmother.   EXAM: DIGITAL DIAGNOSTIC BILATERAL MAMMOGRAM WITH TOMOSYNTHESIS AND CAD; ULTRASOUND LEFT BREAST LIMITED  IMPRESSION: 1. There is a suspicious 3.7 cm mass in the left breast at  12 o'clock.   2.  No evidence of left axillary lymphadenopathy.   3.  No evidence of malignancy in the right breast.   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 Based on the biopsy, the carcinoma appears Nottingham grade 3 of 3 and measures 1.2 cm in greatest linear extent.  PROGNOSTIC INDICATORS Results: The tumor cells are NEGATIVE for Her2 (0). Estrogen Receptor: 0%, NEGATIVE Progesterone Receptor: 0%, NEGATIVE Proliferation Marker Ki67: 40%   03/15/2022 Initial Diagnosis   Malignant neoplasm of upper-outer quadrant of left breast in female, estrogen receptor negative (HCC)   03/28/2022 Genetic Testing   Negative hereditary cancer genetic testing: no pathogenic variants detected in [employer] BRCAPlus Panel or [employer] CustomNext-Cancer +RNAinsight Panel.  Report dates are 03/28/2022 and 03/31/2022. Marland Kitchen   The BRCAplus panel offered by [employer] and includes sequencing and deletion/duplication analysis for the following 8 genes: ATM, BRCA1, BRCA2, CDH1, CHEK2, PALB2, PTEN, and TP53.  The CustomNext-Cancer+RNAinsight panel offered by [employer] includes sequencing and rearrangement analysis for the following 47 genes:  APC, ATM, AXIN2, BARD1, BMPR1A, BRCA1, BRCA2, BRIP1, CDH1, CDK4, CDKN2A, CHEK2, DICER1, EPCAM, GREM1, HOXB13, MEN1, MLH1, MSH2, MSH3, MSH6, MUTYH, NBN, NF1,  NF2, NTHL1, PALB2, PMS2, POLD1, POLE, PTEN, RAD51C, RAD51D, RECQL, RET, SDHA, SDHAF2, SDHB, SDHC, SDHD, SMAD4, SMARCA4, STK11, TP53, TSC1, TSC2,  and VHL.  RNA data is routinely analyzed for use in variant interpretation for all genes.   05/14/2022 Cancer Staging   Staging form: Breast, AJCC 8th Edition - Pathologic stage from 05/14/2022: Stage IIA (pT2, pN0, cM0, G3, ER-, PR-, HER2-) - Signed by Malachy Mood, MD on 05/28/2022 Stage prefix: Initial diagnosis Histologic grading system: 3 grade system Residual tumor (R): R0 - None   06/13/2022 -  Chemotherapy   Patient is on Treatment Plan : BREAST Pembrolizumab (200) D1 + Carboplatin (1.5) D1,8,15 + Paclitaxel (80) D1,8,15 q21d X 4 cycles / Pembrolizumab (200) D1 + AC D1 q21d x 4 cycles     06/14/2022 - 07/05/2022 Chemotherapy   Patient is on Treatment Plan : BREAST Pembrolizumab (200) D1 + Carboplatin (5) D1 + Paclitaxel (80) D1,8,15 q21d X 4 cycles / Pembrolizumab (200) D1 + AC D1 q21d x 4 cycles        INTERVAL HISTORY: *** Gabrielle [name] is here for a follow up of left breast cancer . She was last seen by me on 03/19/2023. She presents to the clinic     All other systems were reviewed with the patient and are negative.  MEDICAL HISTORY:  Past Medical History:  Diagnosis Date   Arthritis    Bilateral Knees   Cancer (HCC) 03/11/2022   Breast   Family history of breast cancer 03/21/2022   Family history of prostate cancer 03/21/2022   Hypercholesteremia     SURGICAL HISTORY: Past Surgical History:  Procedure Laterality Date   AXILLARY SENTINEL NODE BIOPSY Left 05/14/2022   Procedure: LEFT AXILLARY SENTINEL NODE BIOPSY;  Surgeon: [name], MD;  Location: [facility] OR;  Service: General;  Laterality: Left;  GEN w/ PEC BLOCK   BREAST BIOPSY Left 03/11/2022   BREAST LUMPECTOMY WITH AXILLARY LYMPH NODE BIOPSY Left 04/16/2022   Procedure: LEFT BREAST LUMPECTOMY WITH LEFT AXILLARY SENTINEL LYMPH NODE BIOPSY;  Surgeon: [name], MD;  Location: [facility];  Service: General;  Laterality: Left;   DENTAL SURGERY     PORTACATH PLACEMENT Right 04/16/2022    Procedure: INSERTION PORT-A-CATH;  Surgeon: [name], MD;  Location: [facility];  Service: General;  Laterality: Right;    I have reviewed the social history and family history with the patient and they are unchanged from previous note.  ALLERGIES:  has No Known Allergies.  MEDICATIONS:  Current Outpatient Medications  Medication Sig Dispense Refill   acetaminophen (TYLENOL) 500 MG tablet Take 1,000 mg by mouth every 6 (six) hours as needed (pain.).     ALPRAZolam (XANAX) 0.5 MG tablet Take 1 tablet (0.5 mg total) by mouth as needed for anxiety. 30 tablet 0   aspirin-acetaminophen-caffeine (EXCEDRIN MIGRAINE) 250-250-65 MG tablet Take by mouth every 6 (six) hours as needed for headache.     calcium-vitamin D (OSCAL WITH D) 500-5 MG-MCG tablet Take 1 tablet by mouth 2 (two) times daily for 7 days. 14 tablet 0   dexamethasone (DECADRON) 4 MG tablet Take 1 tablet by mouth daily.     gabapentin (NEURONTIN) 100 MG capsule Take 1-2 capsules (100-200 mg total) by mouth 2 (two) times daily. 60 capsule 0   ibuprofen (ADVIL) 200 MG tablet Take 200 mg by mouth every 8 (eight) hours as needed (pain).     methocarbamol (ROBAXIN) 500 MG tablet Take by mouth as  needed.     mirtazapine (REMERON) 15 MG tablet Take 1 tablet (15 mg total) by mouth at bedtime. 30 tablet 2   ondansetron (ZOFRAN) 4 MG tablet Take 4 mg by mouth every 8 (eight) hours as needed for nausea or vomiting.     oxyCODONE (OXY IR/ROXICODONE) 5 MG immediate release tablet Take one tablet po q hs prn pain due to radiation dermatitis 20 tablet 0   pantoprazole (PROTONIX) 20 MG tablet Take 1 tablet (20 mg total) by mouth daily. 30 tablet 1   pantoprazole (PROTONIX) 20 MG tablet Take 1 tablet by mouth daily.     potassium chloride SA (KLOR-CON M) 20 MEQ tablet Take 1 tablet (20 mEq total) by mouth daily. 30 tablet 0   prochlorperazine (COMPAZINE) 10 MG tablet Take 1 tablet (10 mg total) by mouth every 6 (six) hours as  needed for nausea or vomiting. 30 tablet 0   promethazine (PHENERGAN) 25 MG tablet Take 1 tablet (25 mg total) by mouth every 6 (six) hours as needed for nausea or vomiting. 30 tablet 0   zolpidem (AMBIEN) 5 MG tablet Take 1 tablet (5 mg total) by mouth at bedtime as needed for sleep. 30 tablet 0   No current facility-administered medications for this visit.   Facility-Administered Medications Ordered in Other Visits  Medication Dose Route Frequency Provider Last Rate Last Admin   diphenhydrAMINE (BENADRYL) 50 MG/ML injection            famotidine (PEPCID) 20-0.9 MG/50ML-% IVPB             PHYSICAL EXAMINATION: ECOG PERFORMANCE STATUS: {CHL ONC ECOG [phone]}  There were no vitals filed for this visit. Wt Readings from Last 3 Encounters:  03/19/23 207 lb (93.9 kg)  02/18/23 230 lb (104.3 kg)  01/01/23 243 lb (110.2 kg)    {Only keep what was examined. If exam not performed, can use .CEXAM } GENERAL:alert, no distress and comfortable SKIN: skin color, texture, turgor are normal, no rashes or significant lesions EYES: normal, Conjunctiva are pink and non-injected, sclera clear {OROPHARYNX:no exudate, no erythema and lips, buccal mucosa, and tongue normal}  NECK: supple, thyroid normal size, non-tender, without nodularity LYMPH:  no palpable lymphadenopathy in the cervical, axillary {or inguinal} LUNGS: clear to auscultation and percussion with normal breathing effort HEART: regular rate & rhythm and no murmurs and no lower extremity edema ABDOMEN:abdomen soft, non-tender and normal bowel sounds Musculoskeletal:no cyanosis of digits and no clubbing  NEURO: alert & oriented x 3 with fluent speech, no focal motor/sensory deficits  LABORATORY DATA:  I have reviewed the data as listed    Latest Ref Rng & Units 12/27/2022    1:38 PM 10/31/2022   11:35 AM 10/24/2022   11:14 AM  CBC  WBC 4.0 - 10.5 K/uL 6.0  18.4  15.6   Hemoglobin 12.0 - 15.0 g/dL 16.1  09.6  9.5   Hematocrit  36.0 - 46.0 % 38.6  32.2  29.2   Platelets 150 - 400 K/uL 260  228  300         Latest Ref Rng & Units 12/27/2022    1:38 PM 10/31/2022   11:35 AM 10/24/2022   11:14 AM  CMP  Glucose 70 - 99 mg/dL 045  94  83   BUN 6 - 20 mg/dL 11  16  14    Creatinine 0.44 - 1.00 mg/dL 4.09  8.11  9.14   Sodium 135 - 145 mmol/L 136  138  138  Potassium 3.5 - 5.1 mmol/L 3.4  4.1  2.8   Chloride 98 - 111 mmol/L 101  101  100   CO2 22 - 32 mmol/L 29  32  30   Calcium 8.9 - 10.3 mg/dL 47.8  29.5  7.1    NSERUM   Total Protein 6.5 - 8.1 g/dL 7.2  6.2  6.1   Total Bilirubin 0.3 - 1.2 mg/dL 0.7  0.4  0.4   Alkaline Phos 38 - 126 U/L 66  105  80   AST 15 - 41 U/L 28  28  28    ALT 0 - 44 U/L 11  24  19        RADIOGRAPHIC STUDIES: I have personally reviewed the radiological images as listed and agreed with the findings in the report. No results found.    No orders of the defined types were placed in this encounter.  All questions were answered. The patient knows to call the clinic with any problems, questions or concerns. No barriers to learning was detected. The total time spent in the appointment was {CHL ONC TIME VISIT - AOZHY:[id number]}.     Salome Holmes, CMA 03/31/2023   I, Monica Martinez, CMA, am acting as scribe for Malachy Mood, MD.   {Add scribe attestation statement}

## 2023-04-01 ENCOUNTER — Ambulatory Visit: Payer: Medicaid Other | Admitting: Nurse Practitioner

## 2023-04-03 ENCOUNTER — Encounter: Payer: Self-pay | Admitting: Nurse Practitioner

## 2023-04-10 ENCOUNTER — Ambulatory Visit (INDEPENDENT_AMBULATORY_CARE_PROVIDER_SITE_OTHER): Payer: Medicaid Other

## 2023-04-10 ENCOUNTER — Encounter: Payer: Self-pay | Admitting: Internal Medicine

## 2023-04-10 ENCOUNTER — Ambulatory Visit: Payer: Medicaid Other | Attending: Internal Medicine | Admitting: Internal Medicine

## 2023-04-10 ENCOUNTER — Ambulatory Visit: Payer: Medicaid Other | Attending: Radiation Oncology | Admitting: Rehabilitation

## 2023-04-10 VITALS — BP 108/73 | HR 97 | Resp 16 | Ht 69.25 in | Wt 208.0 lb

## 2023-04-10 DIAGNOSIS — Z8739 Personal history of other diseases of the musculoskeletal system and connective tissue: Secondary | ICD-10-CM | POA: Insufficient documentation

## 2023-04-10 DIAGNOSIS — M25561 Pain in right knee: Secondary | ICD-10-CM

## 2023-04-10 DIAGNOSIS — G6289 Other specified polyneuropathies: Secondary | ICD-10-CM | POA: Diagnosis not present

## 2023-04-10 NOTE — Progress Notes (Signed)
Office Visit Note  Patient: Gabrielle [name]             Date of Birth: October 18, 1991           MRN: [id number]             PCP: Norm Salt, PA Referring: Malachy Mood, MD Visit Date: 04/10/2023   Subjective:  New Patient (Initial Visit) (Patient states her knees hurt the worst. Patient states her feet and hands hurt as well. 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 Patient states she gets prickly feelings in her feet and hands. Patient states she struggles to stand up from a sitting position. )   History of Present Illness: Gabrielle [name] is a 32 y.o. female here for evaluation of joint pain especially knees. She has a history of previous JRA but was apparently never on long term DMARD treatment mostly symptoms controlled with NSAIDs. Had been resolved without chronic joint inflammation and no surgery required. She was diagnosed with breast cancer last year and underwent lumpectomy and adjuvant chemotherapy regimen including paclitaxel, carboplatin, and [place] starting July last year. She had side effects with treatment including neuropathy in hands and feet started treatment with gabapentin for this. Also started to develop increased joint pain at multiple areas but particularly knees. She completed last infusion in November then completed radiation treatment in April. Many side effects and much of her overall body aches improved with still having knee pain. Does not see much visible swelling. Morning stiffness for a few minutes also has trouble getting up from sitting, worse after prolonged time in one position. Still has some pain in hands and feet but also sometimes prickly type sensation partially improved with gabapentin.   Activities of Daily Living:  Patient reports morning stiffness for 5-10 minutes.   Patient Reports nocturnal pain.  Difficulty dressing/grooming: Denies Difficulty climbing stairs: Reports Difficulty getting out of chair:  Reports Difficulty using hands for taps, buttons, cutlery, and/or writing: Reports  Review of Systems  Constitutional:  Positive for fatigue.  HENT:  Positive for mouth dryness. Negative for mouth sores.   Eyes:  Negative for dryness.  Respiratory:  Negative for shortness of breath.   Cardiovascular:  Negative for chest pain and palpitations.  Gastrointestinal:  Negative for blood in stool, constipation and diarrhea.  Endocrine: Negative for increased urination.  Genitourinary:  Negative for involuntary urination.  Musculoskeletal:  Positive for joint pain, gait problem, joint pain, myalgias, muscle weakness, morning stiffness and myalgias. Negative for joint swelling and muscle tenderness.  Skin:  Positive for sensitivity to sunlight. Negative for color change, rash and hair loss.  Allergic/Immunologic: Negative for susceptible to infections.  Neurological:  Negative for dizziness and headaches.  Hematological:  Negative for swollen glands.  Psychiatric/Behavioral:  Positive for sleep disturbance. Negative for depressed mood. The patient is nervous/anxious.     PMFS History:  Patient Active Problem List   Diagnosis Date Noted  . History of juvenile arthritis 04/10/2023  . Nausea with vomiting 09/19/2022  . Joint pain 09/19/2022  . Encounter for antineoplastic chemotherapy 09/19/2022  . Peripheral neuropathy 09/19/2022  . Hypokalemia 09/19/2022  . Port-A-Cath in place 06/14/2022  . Genetic testing 03/29/2022  . Family history of breast cancer 03/21/2022  . Family history of prostate cancer 03/21/2022  . Malignant neoplasm of upper-outer quadrant of left breast in female, estrogen receptor negative (HCC) 03/15/2022    Past Medical History:  Diagnosis Date  . Arthritis  Bilateral Knees  . Cancer (HCC) 03/11/2022   Breast  . Family history of breast cancer 03/21/2022  . Family history of prostate cancer 03/21/2022  . Hypercholesteremia   . Juvenile arthritis (HCC)   .  Neuropathy     Family History  Problem Relation Age of Onset  . Breast cancer Mother 41  . Prostate cancer Father 30  . Diabetes Brother   . Breast cancer Paternal Grandmother        dx after 50  . Prostate cancer Paternal Grandfather        dx 103s; metastatic  . ADD / ADHD Son   . Breast cancer Maternal Aunt 63  . Breast cancer Paternal Aunt        dx unknown age  . Prostate cancer Paternal Uncle        dx after 50  . Cystic fibrosis Niece    Past Surgical History:  Procedure Laterality Date  . AXILLARY SENTINEL NODE BIOPSY Left 05/14/2022   Procedure: LEFT AXILLARY SENTINEL NODE BIOPSY;  Surgeon: [name], MD;  Location: [facility] OR;  Service: General;  Laterality: Left;  GEN w/ PEC BLOCK  . BREAST BIOPSY Left 03/11/2022  . BREAST LUMPECTOMY WITH AXILLARY LYMPH NODE BIOPSY Left 04/16/2022   Procedure: LEFT BREAST LUMPECTOMY WITH LEFT AXILLARY SENTINEL LYMPH NODE BIOPSY;  Surgeon: [name], MD;  Location: [facility];  Service: General;  Laterality: Left;  . DENTAL SURGERY    . PORTACATH PLACEMENT Right 04/16/2022   Procedure: INSERTION PORT-A-CATH;  Surgeon: [name], MD;  Location:  SUR[facility]ERY [facility];  Service: General;  Laterality: Right;   Social History   Social History Narrative  . Not on file   Immunization History  Administered Date(s) Administered  . Tdap 12/19/2016     Objective: Vital Signs: BP 108/73 (BP Location: Right Arm, Patient Position: Sitting, Cuff Size: Normal)   Pulse 97   Resp 16   Ht 5' 9.25" (1.759 m)   Wt 208 lb (94.3 kg)   LMP  (LMP Unknown) Comment: Patient states she had her period sometime last month  BMI 30.49 kg/m    Physical Exam HENT:     Mouth/Throat:     Mouth: Mucous membranes are moist.     Pharynx: Oropharynx is clear.  Eyes:     Conjunctiva/sclera: Conjunctivae normal.  Cardiovascular:     Rate and Rhythm: Normal rate and regular rhythm.  Pulmonary:     Effort: Pulmonary  effort is normal.     Breath sounds: Normal breath sounds.  Musculoskeletal:     Right lower leg: No edema.     Left lower leg: No edema.  Lymphadenopathy:     Cervical: No cervical adenopathy.  Skin:    General: Skin is warm and dry.     Findings: No rash.  Neurological:     Mental Status: She is alert.     Comments: Tenderness across plantar side of both feet  Psychiatric:        Mood and Affect: Mood normal.     Musculoskeletal Exam:  Shoulders full ROM no tenderness or swelling, discomfort with left shoulder elevation or abduction with upper left chest wall Elbows full ROM no tenderness or swelling Wrists full ROM no tenderness or swelling Fingers full ROM no tenderness or swelling Knees full ROM right knee larger than left without palpable effusion or patellar ballotment, no crepitus Ankles full ROM no tenderness or swelling MTPs full ROM no tenderness or swelling  Investigation: No additional findings.  Imaging: No results found.  Recent Labs: Lab Results  Component Value Date   WBC 6.0 12/27/2022   HGB 13.0 12/27/2022   PLT 260 12/27/2022   NA 136 12/27/2022   K 3.4 (L) 12/27/2022   CL 101 12/27/2022   CO2 29 12/27/2022   GLUCOSE 106 (H) 12/27/2022   BUN 11 12/27/2022   CREATININE 0.92 12/27/2022   BILITOT 0.7 12/27/2022   ALKPHOS 66 12/27/2022   AST 28 12/27/2022   ALT 11 12/27/2022   PROT 7.2 12/27/2022   ALBUMIN 4.0 12/27/2022   CALCIUM 11.5 (H) 12/27/2022    Speciality Comments: No specialty comments available.  Procedures:  No procedures performed Allergies: Patient has no known allergies.   Assessment / Plan:     Visit Diagnoses: Arthralgia of right knee - Plan: ANA, Sedimentation rate, C-reactive protein, Rheumatoid factor, Cyclic citrul peptide antibody, IgG, CBC with Differential/Platelet, COMPLETE METABOLIC PANEL WITH GFR, XR KNEE 3 VIEW RIGHT  Other polyneuropathy  Hand and foot pain with normal exam looks more suspicious for  neuropathy vs inflammatory arthritis.  History of juvenile arthritis  History of juvenile onset arthritis but do not have records for review of specific details. Unclear if she has positive serology apparently was never on long term DMARD treatment. Checking ANA, RF, and CCP. Symptoms could be consistent with exacerbation of underlying disease with recent events and treatment but not much synovitis appreciable.  Orders: Orders Placed This Encounter  Procedures  . XR KNEE 3 VIEW RIGHT  . ANA  . Sedimentation rate  . C-reactive protein  . Rheumatoid factor  . Cyclic citrul peptide antibody, IgG  . CBC with Differential/Platelet  . COMPLETE METABOLIC PANEL WITH GFR   No orders of the defined types were placed in this encounter.    Follow-Up Instructions: Return in about 4 weeks (around 05/08/2023) for New pt ?IRAE/HxJIA f/u 91mo.   Fuller Plan, MD  Note - This record has been created using [employer].  Chart creation errors have been sought, but may not always  have been located. Such creation errors do not reflect on  the standard of medical care.

## 2023-04-11 ENCOUNTER — Encounter: Payer: Self-pay | Admitting: Hematology

## 2023-04-13 ENCOUNTER — Encounter: Payer: Self-pay | Admitting: Hematology

## 2023-04-13 LAB — CYCLIC CITRUL PEPTIDE ANTIBODY, IGG: Cyclic Citrullin Peptide Ab: <16 UNITS

## 2023-04-13 LAB — CBC WITH DIFFERENTIAL/PLATELET
Absolute Monocytes: 535 cells/uL (ref 200–950)
Basophils Absolute: 0 cells/uL (ref 0–200)
Basophils Relative: 0 %
Eosinophils Absolute: 122 cells/uL (ref 15–500)
Eosinophils Relative: 2.3 %
HCT: 39.9 % (ref 35.0–45.0)
Hemoglobin: 12.8 g/dL (ref 11.7–15.5)
Lymphs Abs: 2475 cells/uL (ref 850–3900)
MCH: 26.8 pg — ABNORMAL LOW (ref 27.0–33.0)
MCHC: 32.1 g/dL (ref 32.0–36.0)
MCV: 83.5 fL (ref 80.0–100.0)
MPV: 9.3 fL (ref 7.5–12.5)
Monocytes Relative: 10.1 %
Neutro Abs: 2168 cells/uL (ref 1500–7800)
Neutrophils Relative %: 40.9 %
Platelets: 385 10*3/uL (ref 140–400)
RBC: 4.78 10*6/uL (ref 3.80–5.10)
RDW: 14.6 % (ref 11.0–15.0)
Total Lymphocyte: 46.7 %
WBC: 5.3 10*3/uL (ref 3.8–10.8)

## 2023-04-13 LAB — COMPLETE METABOLIC PANEL WITH GFR
AG Ratio: 1.6 (calc) (ref 1.0–2.5)
ALT: 7 U/L (ref 6–29)
AST: 20 U/L (ref 10–30)
Albumin: 4.2 g/dL (ref 3.6–5.1)
Alkaline phosphatase (APISO): 68 U/L (ref 31–125)
BUN/Creatinine Ratio: 9 (calc) (ref 6–22)
BUN: 6 mg/dL — ABNORMAL LOW (ref 7–25)
CO2: 27 mmol/L (ref 20–32)
Calcium: 10.4 mg/dL — ABNORMAL HIGH (ref 8.6–10.2)
Chloride: 103 mmol/L (ref 98–110)
Creat: 0.68 mg/dL (ref 0.50–0.97)
Globulin: 2.7 g/dL (calc) (ref 1.9–3.7)
Glucose, Bld: 89 mg/dL (ref 65–99)
Potassium: 3.5 mmol/L (ref 3.5–5.3)
Sodium: 139 mmol/L (ref 135–146)
Total Bilirubin: 0.7 mg/dL (ref 0.2–1.2)
Total Protein: 6.9 g/dL (ref 6.1–8.1)
eGFR: 118 mL/min/{1.73_m2} (ref >=60)

## 2023-04-13 LAB — ANA: Anti Nuclear Antibody (ANA): NEGATIVE

## 2023-04-13 LAB — RHEUMATOID FACTOR: Rheumatoid fact SerPl-aCnc: <10 IU/mL (ref <14)

## 2023-04-13 LAB — SEDIMENTATION RATE: Sed Rate: 25 mm/h — ABNORMAL HIGH (ref 0–20)

## 2023-04-13 LAB — C-REACTIVE PROTEIN: CRP: 3.9 mg/L (ref <8.0)

## 2023-04-28 ENCOUNTER — Ambulatory Visit
Admission: RE | Admit: 2023-04-28 | Discharge: 2023-04-28 | Disposition: A | Discharge status: Home / Self Care | Payer: Medicaid Other | Source: Ambulatory Visit | Attending: Radiation Oncology | Admitting: Radiation Oncology

## 2023-04-28 NOTE — Progress Notes (Signed)
  Radiation Oncology         [phone] ________________________________  Name: Gabrielle [name] MRN: [id number]  Date of Service: 04/28/2023  DOB: 12/21/1990  Post Treatment Telephone Note  Diagnosis:  Stage IIB, cT2N0M0 grade 3, triple negative invasive ductal carcinoma of the left breast (as documented in provider EOT note)   The patient was not available for call today. A voicemail was left.  The patient was encouraged to avoid sun exposure in the area of prior treatment for up to one year following radiation with either sunscreen or by the style of clothing worn in the sun.  The patient has scheduled follow up with her medical oncologist Dr. Mosetta Putt  for ongoing surveillance, and was encouraged to call if she develops concerns or questions regarding radiation.    Ruel Favors, LPN

## 2023-05-11 NOTE — Progress Notes (Unsigned)
Office Visit Note  Patient: Gabrielle [name]             Date of Birth: 10/28/1991           MRN: [id number]             PCP: Norm Salt, PA Referring: Norm Salt, [place] Visit Date: 05/12/2023   Subjective:  No chief complaint on file.   History of Present Illness: Gabrielle [name] is a 32 y.o. female here for follow up ***   Previous HPI 04/10/23 Gabrielle [name] is a 32 y.o. female here for evaluation of joint pain especially knees. She has a history of previous JRA but was apparently never on long term DMARD treatment mostly symptoms controlled with NSAIDs. Had been resolved without chronic joint inflammation and no surgery required. She was diagnosed with breast cancer last year and underwent lumpectomy and adjuvant chemotherapy regimen including paclitaxel, carboplatin, and [place] starting July last year. She had side effects with treatment including neuropathy in hands and feet started treatment with gabapentin for this. Also started to develop increased joint pain at multiple areas but particularly knees. She completed last infusion in November then completed radiation treatment in April. Many side effects and much of her overall body aches improved with still having knee pain. Does not see much visible swelling. Morning stiffness for a few minutes also has trouble getting up from sitting, worse after prolonged time in one position. Still has some pain in hands and feet but also sometimes prickly type sensation partially improved with gabapentin.    No Rheumatology ROS completed.   PMFS History:  Patient Active Problem List   Diagnosis Date Noted   History of juvenile arthritis 04/10/2023   Nausea with vomiting 09/19/2022   Joint pain 09/19/2022   Encounter for antineoplastic chemotherapy 09/19/2022   Peripheral neuropathy 09/19/2022   Hypokalemia 09/19/2022   Port-A-Cath in place 06/14/2022   Genetic testing 03/29/2022   Family history of breast cancer 03/21/2022    Family history of prostate cancer 03/21/2022   Malignant neoplasm of upper-outer quadrant of left breast in female, estrogen receptor negative (HCC) 03/15/2022    Past Medical History:  Diagnosis Date   Arthritis    Bilateral Knees   Cancer (HCC) 03/11/2022   Breast   Family history of breast cancer 03/21/2022   Family history of prostate cancer 03/21/2022   Hypercholesteremia    Juvenile arthritis (HCC)    Neuropathy     Family History  Problem Relation Age of Onset   Breast cancer Mother 59   Prostate cancer Father 71   Diabetes Brother    Breast cancer Paternal Grandmother        dx after 33   Prostate cancer Paternal Grandfather        dx 87s; metastatic   ADD / ADHD Son    Breast cancer Maternal Aunt 57   Breast cancer Paternal Aunt        dx unknown age   Prostate cancer Paternal Uncle        dx after 9   Cystic fibrosis Niece    Past Surgical History:  Procedure Laterality Date   AXILLARY SENTINEL NODE BIOPSY Left 05/14/2022   Procedure: LEFT AXILLARY SENTINEL NODE BIOPSY;  Surgeon: [name], MD;  Location: [facility] OR;  Service: General;  Laterality: Left;  GEN w/ PEC BLOCK   BREAST BIOPSY Left 03/11/2022   BREAST LUMPECTOMY WITH AXILLARY LYMPH NODE BIOPSY Left 04/16/2022  Procedure: LEFT BREAST LUMPECTOMY WITH LEFT AXILLARY SENTINEL LYMPH NODE BIOPSY;  Surgeon: [name], MD;  Location: [facility];  Service: General;  Laterality: Left;   DENTAL SURGERY     PORTACATH PLACEMENT Right 04/16/2022   Procedure: INSERTION PORT-A-CATH;  Surgeon: [name], MD;  Location: [facility];  Service: General;  Laterality: Right;   Social History   Social History Narrative   Not on file   Immunization History  Administered Date(s) Administered   Tdap 12/19/2016     Objective: Vital Signs: There were no vitals taken for this visit.   Physical Exam   Musculoskeletal Exam: ***  CDAI Exam: CDAI Score: -- Patient  Global: --; Provider Global: -- Swollen: --; Tender: -- Joint Exam 05/12/2023   No joint exam has been documented for this visit   There is currently no information documented on the homunculus. Go to the Rheumatology activity and complete the homunculus joint exam.  Investigation: No additional findings.  Imaging: No results found.  Recent Labs: Lab Results  Component Value Date   WBC 5.3 04/10/2023   HGB 12.8 04/10/2023   PLT 385 04/10/2023   NA 139 04/10/2023   K 3.5 04/10/2023   CL 103 04/10/2023   CO2 27 04/10/2023   GLUCOSE 89 04/10/2023   BUN 6 (L) 04/10/2023   CREATININE 0.68 04/10/2023   BILITOT 0.7 04/10/2023   ALKPHOS 66 12/27/2022   AST 20 04/10/2023   ALT 7 04/10/2023   PROT 6.9 04/10/2023   ALBUMIN 4.0 12/27/2022   CALCIUM 10.4 (H) 04/10/2023    Speciality Comments: No specialty comments available.  Procedures:  No procedures performed Allergies: Patient has no known allergies.   Assessment / Plan:     Visit Diagnoses: No diagnosis found.  ***  Orders: No orders of the defined types were placed in this encounter.  No orders of the defined types were placed in this encounter.    Follow-Up Instructions: No follow-ups on file.   Fuller Plan, MD  Note - This record has been created using [employer].  Chart creation errors have been sought, but may not always  have been located. Such creation errors do not reflect on  the standard of medical care.

## 2023-05-12 ENCOUNTER — Ambulatory Visit: Payer: Medicaid Other | Attending: Internal Medicine | Admitting: Internal Medicine

## 2023-05-12 ENCOUNTER — Encounter: Payer: Self-pay | Admitting: Internal Medicine

## 2023-05-12 VITALS — BP 94/65 | HR 82 | Resp 12 | Ht 69.25 in | Wt 213.0 lb

## 2023-05-12 DIAGNOSIS — G6289 Other specified polyneuropathies: Secondary | ICD-10-CM

## 2023-05-12 DIAGNOSIS — G8929 Other chronic pain: Secondary | ICD-10-CM | POA: Diagnosis not present

## 2023-05-12 DIAGNOSIS — Z8739 Personal history of other diseases of the musculoskeletal system and connective tissue: Secondary | ICD-10-CM | POA: Diagnosis not present

## 2023-05-12 DIAGNOSIS — M25561 Pain in right knee: Secondary | ICD-10-CM

## 2023-05-12 MED ORDER — MELOXICAM 15 MG PO TABS: 15.0000 mg | ORAL_TABLET | Freq: Every day | ORAL | 1 refills | Status: DC

## 2023-05-12 NOTE — Patient Instructions (Signed)
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 I recommend taking a once daily antiinflammatory medication daily for the next month and then just as needed. You can also try wearing a flexible knee brace or wrap for support.  The leg swelling sounds more like peripheral edema and probably not coming from the joints. I attached some information for this. Okay to just monitor for now, if you starts to get pain or develop rashes in the swollen areas can let us know.

## 2023-05-13 ENCOUNTER — Other Ambulatory Visit: Payer: Self-pay

## 2023-06-02 ENCOUNTER — Telehealth: Payer: Self-pay | Admitting: *Deleted

## 2023-06-02 DIAGNOSIS — G6289 Other specified polyneuropathies: Secondary | ICD-10-CM

## 2023-06-02 MED ORDER — GABAPENTIN 300 MG PO CAPS: 300.0000 mg | ORAL_CAPSULE | Freq: Two times a day (BID) | ORAL | 0 refills | Status: DC

## 2023-06-02 NOTE — Telephone Encounter (Signed)
I spoke with Gabrielle [name] she reports increasing pain and stiffness in her distal legs progressively worse for about 1 week.  Overall had been doing pretty well since starting the daily meloxicam knee pain was improved.  Has not seen any new visible swelling or discoloration.  This pain is all distal to the knees.  Previously felt this with severe stiffness first thing getting out of bed now she is getting stiffness after just prolonged sitting in 1 position.  Says this pain also feels similar to what she took the gabapentin for but is seeing less benefit from the current 2 100 mg capsules BID. I recommend we can try increasing gabapentin to 300 mg BID for now and see if symptoms improve more. If not need to move up her follow up for new exam.

## 2023-06-02 NOTE — Telephone Encounter (Signed)
Patient contacted the office and states her lower legs and feet are becoming very stiff. Patient states when she gets out of bed she is very stiff and it is hard to lift her back up. Patient states the stiffness is becoming an everyday thing. Patient states her feet and lower legs are hurting her very badly. Patient states sometimes her lower legs and feet feel like they are on fire. Patient states it all started last week. Patient states she has been taking the Tylenol 500 MG and it is not helping. Patient call back number is [phone]. Please advise.

## 2023-06-02 NOTE — Addendum Note (Signed)
Addended by: Fuller Plan on: 06/02/2023 12:55 PM   Modules accepted: Orders

## 2023-06-02 NOTE — Telephone Encounter (Signed)
Patient LMOM regarding total body stiffness, leg and foot patient, LMOM for patient to return call.

## 2023-06-18 NOTE — Progress Notes (Unsigned)
Patient Care Team: Norm Salt, [place] as PCP - General (Physician Assistant) [name], RN as Oncology Nurse Navigator [name], RN as Oncology Nurse Navigator [name], MD as Consulting Physician (General Surgery) Malachy Mood, MD as Consulting Physician (Hematology) Dorothy Puffer, MD as Consulting Physician (Radiation Oncology) Ranee Gosselin, MD as Consulting Physician (Orthopedic Surgery) Raymondo Band as Physician Assistant (Hematology and Oncology)   CHIEF COMPLAINT: Follow up left breast cancer  Oncology History Overview Note   Cancer Staging  Malignant neoplasm of upper-outer quadrant of left breast in female, estrogen receptor negative (HCC) Staging form: Breast, AJCC 8th Edition - Clinical stage from 03/11/2022: Stage IIB (cT2, cN0, cM0, G3, ER-, PR-, HER2-) - Signed by Malachy Mood, MD on 03/19/2022    Malignant neoplasm of upper-outer quadrant of left breast in female, estrogen receptor negative (HCC)  03/08/2022 Mammogram   CLINICAL DATA:  32 year old female presenting for evaluation of a palpable lump in the left breast which she feels has increased in size since she first identified it. Her mother was diagnosed with breast cancer within the last 6 months. She also has family history of breast cancer in multiple aunts and her maternal grandmother.   EXAM: DIGITAL DIAGNOSTIC BILATERAL MAMMOGRAM WITH TOMOSYNTHESIS AND CAD; ULTRASOUND LEFT BREAST LIMITED  IMPRESSION: 1. There is a suspicious 3.7 cm mass in the left breast at 12 o'clock.   2.  No evidence of left axillary lymphadenopathy.   3.  No evidence of malignancy in the right breast.   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 Based on the biopsy, the carcinoma appears Nottingham grade 3 of 3 and measures 1.2 cm in greatest linear extent.  PROGNOSTIC INDICATORS Results: The tumor cells are NEGATIVE for Her2 (0). Estrogen Receptor: 0%, NEGATIVE Progesterone Receptor: 0%, NEGATIVE Proliferation Marker Ki67: 40%   03/15/2022 Initial Diagnosis   Malignant neoplasm of upper-outer quadrant of left breast in female, estrogen receptor negative (HCC)   03/28/2022 Genetic Testing   Negative hereditary cancer genetic testing: no pathogenic variants detected in [facility] BRCAPlus Panel or [employer] CustomNext-Cancer +RNAinsight Panel.  Report dates are 03/28/2022 and 03/31/2022. Marland Kitchen   The BRCAplus panel offered by [employer] and includes sequencing and deletion/duplication analysis for the following 8 genes: ATM, BRCA1, BRCA2, CDH1, CHEK2, PALB2, PTEN, and TP53.  The CustomNext-Cancer+RNAinsight panel offered by [employer] includes sequencing and rearrangement analysis for the following 47 genes:  APC, ATM, AXIN2, BARD1, BMPR1A, BRCA1, BRCA2, BRIP1, CDH1, CDK4, CDKN2A, CHEK2, DICER1, EPCAM, GREM1, HOXB13, MEN1, MLH1, MSH2, MSH3, MSH6, MUTYH, NBN, NF1, NF2, NTHL1, PALB2, PMS2, POLD1, POLE, PTEN, RAD51C, RAD51D, RECQL, RET, SDHA, SDHAF2, SDHB, SDHC, SDHD, SMAD4, SMARCA4, STK11, TP53, TSC1, TSC2, and VHL.  RNA data is routinely analyzed for use in variant interpretation for all genes.   05/14/2022 Cancer Staging   Staging form: Breast, AJCC 8th Edition - Pathologic stage from 05/14/2022: Stage IIA (pT2, pN0, cM0, G3, ER-, PR-, HER2-) - Signed by Malachy Mood, MD on 05/28/2022 Stage prefix: Initial diagnosis Histologic grading system: 3  grade system Residual tumor (R): R0 - None   06/13/2022 -  Chemotherapy    Patient is on Treatment Plan : BREAST Pembrolizumab (200) D1 + Carboplatin (1.5) D1,8,15 + Paclitaxel (80) D1,8,15 q21d X 4 cycles / Pembrolizumab (200) D1 + AC D1 q21d x 4 cycles     06/14/2022 - 07/05/2022 Chemotherapy   Patient is on Treatment Plan : BREAST Pembrolizumab (200) D1 + Carboplatin (5) D1 + Paclitaxel (80) D1,8,15 q21d X 4 cycles / Pembrolizumab (200) D1 + AC D1 q21d x 4 cycles        CURRENT THERAPY: Surveillance   INTERVAL HISTORY Ms. Song returns for follow up as scheduled, last seen by me 03/19/23. Due to weight loss and n/v we ordered CT AP but she no-showed. Overdue for mammogram   ROS   Past Medical History:  Diagnosis Date   Arthritis    Bilateral Knees   Cancer (HCC) 03/11/2022   Breast   Family history of breast cancer 03/21/2022   Family history of prostate cancer 03/21/2022   Hypercholesteremia    Juvenile arthritis (HCC)    Neuropathy      Past Surgical History:  Procedure Laterality Date   AXILLARY SENTINEL NODE BIOPSY Left 05/14/2022   Procedure: LEFT AXILLARY SENTINEL NODE BIOPSY;  Surgeon: [name], MD;  Location: [facility] OR;  Service: General;  Laterality: Left;  GEN w/ PEC BLOCK   BREAST BIOPSY Left 03/11/2022   BREAST LUMPECTOMY WITH AXILLARY LYMPH NODE BIOPSY Left 04/16/2022   Procedure: LEFT BREAST LUMPECTOMY WITH LEFT AXILLARY SENTINEL LYMPH NODE BIOPSY;  Surgeon: [name], MD;  Location: [facility];  Service: General;  Laterality: Left;   DENTAL SURGERY     PORTACATH PLACEMENT Right 04/16/2022   Procedure: INSERTION PORT-A-CATH;  Surgeon: [name], MD;  Location: [facility];  Service: General;  Laterality: Right;     Outpatient Encounter Medications as of 06/19/2023  Medication Sig   calcium-vitamin D (OSCAL WITH D) 500-5 MG-MCG tablet Take 1 tablet by mouth 2 (two) times daily for 7 days.   gabapentin (NEURONTIN) 100 MG capsule Take 1-2 capsules (100-200 mg total) by mouth 2 (two) times  daily.   gabapentin (NEURONTIN) 300 MG capsule Take 1 capsule (300 mg total) by mouth in the morning and at bedtime.   meloxicam (MOBIC) 15 MG tablet Take 1 tablet (15 mg total) by mouth daily.   ondansetron (ZOFRAN) 4 MG tablet Take 4 mg by mouth every 8 (eight) hours as needed for nausea or vomiting. (Patient not taking: Reported on 05/12/2023)   oxyCODONE (OXY IR/ROXICODONE) 5 MG immediate release tablet Take one tablet po q hs prn pain due to radiation dermatitis (Patient not taking: Reported on 04/10/2023)   Facility-Administered Encounter Medications as of 06/19/2023  Medication   diphenhydrAMINE (BENADRYL) 50 MG/ML injection   famotidine (PEPCID) 20-0.9 MG/50ML-% IVPB     There were no vitals filed for this visit. There is no height or weight on file to calculate BMI.   PHYSICAL EXAM GENERAL:alert, no distress and comfortable SKIN: no rash  EYES: sclera clear NECK: without mass LYMPH:  no palpable cervical or supraclavicular lymphadenopathy  LUNGS: clear with normal breathing effort HEART: regular rate & rhythm, no lower extremity edema ABDOMEN: abdomen soft, non-tender and normal bowel sounds NEURO: alert & oriented x 3 with fluent speech, no focal motor/sensory deficits Breast exam:  PAC without erythema    CBC    Component Value Date/Time   WBC 5.3  04/10/2023 1021   RBC 4.78 04/10/2023 1021   HGB 12.8 04/10/2023 1021   HGB 9.5 (L) 10/24/2022 1114   HCT 39.9 04/10/2023 1021   PLT 385 04/10/2023 1021   PLT 300 10/24/2022 1114   MCV 83.5 04/10/2023 1021   MCH 26.8 (L) 04/10/2023 1021   MCHC 32.1 04/10/2023 1021   RDW 14.6 04/10/2023 1021   LYMPHSABS 2,475 04/10/2023 1021   MONOABS 0.4 12/27/2022 1338   EOSABS 122 04/10/2023 1021   BASOSABS 0 04/10/2023 1021     CMP     Component Value Date/Time   NA 139 04/10/2023 1021   K 3.5 04/10/2023 1021   CL 103 04/10/2023 1021   CO2 27 04/10/2023 1021   GLUCOSE 89 04/10/2023 1021   BUN 6 (L) 04/10/2023 1021    CREATININE 0.68 04/10/2023 1021   CALCIUM 10.4 (H) 04/10/2023 1021   CALCIUM NSERUM 10/24/2022 1114   PROT 6.9 04/10/2023 1021   ALBUMIN 4.0 12/27/2022 1338   AST 20 04/10/2023 1021   AST 28 10/24/2022 1114   ALT 7 04/10/2023 1021   ALT 19 10/24/2022 1114   ALKPHOS 66 12/27/2022 1338   BILITOT 0.7 04/10/2023 1021   BILITOT 0.4 10/24/2022 1114   GFRNONAA >60 12/27/2022 1338   GFRNONAA >60 10/24/2022 1114     ASSESSMENT & PLAN:  PLAN:  No orders of the defined types were placed in this encounter.     All questions were answered. The patient knows to call the clinic with any problems, questions or concerns. No barriers to learning were detected. I spent *** counseling the patient face to face. The total time spent in the appointment was *** and more than 50% was on counseling, review of test results, and coordination of care.   Santiago Glad, NP-C @DATE @

## 2023-06-19 ENCOUNTER — Inpatient Hospital Stay: Payer: Medicaid Other | Attending: Hematology

## 2023-06-19 ENCOUNTER — Encounter: Payer: Self-pay | Admitting: Nurse Practitioner

## 2023-06-19 ENCOUNTER — Other Ambulatory Visit: Payer: Self-pay

## 2023-06-19 ENCOUNTER — Inpatient Hospital Stay (HOSPITAL_BASED_OUTPATIENT_CLINIC_OR_DEPARTMENT_OTHER): Payer: Medicaid Other | Admitting: Nurse Practitioner

## 2023-06-19 ENCOUNTER — Inpatient Hospital Stay: Payer: Medicaid Other | Admitting: Licensed Clinical Social Worker

## 2023-06-19 VITALS — BP 113/85 | HR 73 | Temp 97.2°F | Resp 15 | Wt 200.3 lb

## 2023-06-19 DIAGNOSIS — Z171 Estrogen receptor negative status [ER-]: Secondary | ICD-10-CM

## 2023-06-19 DIAGNOSIS — Z8042 Family history of malignant neoplasm of prostate: Secondary | ICD-10-CM | POA: Insufficient documentation

## 2023-06-19 DIAGNOSIS — Z803 Family history of malignant neoplasm of breast: Secondary | ICD-10-CM | POA: Diagnosis not present

## 2023-06-19 DIAGNOSIS — C50412 Malignant neoplasm of upper-outer quadrant of left female breast: Secondary | ICD-10-CM | POA: Diagnosis present

## 2023-06-19 DIAGNOSIS — E7800 Pure hypercholesterolemia, unspecified: Secondary | ICD-10-CM | POA: Insufficient documentation

## 2023-06-19 DIAGNOSIS — G629 Polyneuropathy, unspecified: Secondary | ICD-10-CM | POA: Insufficient documentation

## 2023-06-19 LAB — COMPREHENSIVE METABOLIC PANEL
ALT: 12 U/L (ref 0–44)
AST: 25 U/L (ref 15–41)
Albumin: 4.1 g/dL (ref 3.5–5.0)
Alkaline Phosphatase: 99 U/L (ref 38–126)
Anion gap: 6 (ref 5–15)
BUN: 14 mg/dL (ref 6–20)
CO2: 27 mmol/L (ref 22–32)
Calcium: 10.4 mg/dL — ABNORMAL HIGH (ref 8.9–10.3)
Chloride: 105 mmol/L (ref 98–111)
Creatinine, Ser: 0.84 mg/dL (ref 0.44–1.00)
GFR, Estimated: >60 mL/min (ref >60)
Glucose, Bld: 82 mg/dL (ref 70–99)
Potassium: 3.7 mmol/L (ref 3.5–5.1)
Sodium: 138 mmol/L (ref 135–145)
Total Bilirubin: 0.6 mg/dL (ref 0.3–1.2)
Total Protein: 6.8 g/dL (ref 6.5–8.1)

## 2023-06-19 LAB — CBC WITH DIFFERENTIAL/PLATELET
Abs Immature Granulocytes: 0.01 10*3/uL (ref 0.00–0.07)
Basophils Absolute: 0 10*3/uL (ref 0.0–0.1)
Basophils Relative: 0 %
Eosinophils Absolute: 0.2 10*3/uL (ref 0.0–0.5)
Eosinophils Relative: 4 %
HCT: 36.6 % (ref 36.0–46.0)
Hemoglobin: 12.4 g/dL (ref 12.0–15.0)
Immature Granulocytes: 0 %
Lymphocytes Relative: 49 %
Lymphs Abs: 2.2 10*3/uL (ref 0.7–4.0)
MCH: 27.4 pg (ref 26.0–34.0)
MCHC: 33.9 g/dL (ref 30.0–36.0)
MCV: 81 fL (ref 80.0–100.0)
Monocytes Absolute: 0.4 10*3/uL (ref 0.1–1.0)
Monocytes Relative: 10 %
Neutro Abs: 1.7 10*3/uL (ref 1.7–7.7)
Neutrophils Relative %: 37 %
Platelets: 262 10*3/uL (ref 150–400)
RBC: 4.52 MIL/uL (ref 3.87–5.11)
RDW: 13.3 % (ref 11.5–15.5)
WBC: 4.5 10*3/uL (ref 4.0–10.5)
nRBC: 0 % (ref 0.0–0.2)

## 2023-06-19 NOTE — Progress Notes (Signed)
[facility] Clinical Social Work  Clinical Social Work was referred by medical provider for assistance with Disability Forms. Clinical Social Worker contacted patient by phone to discuss disability forms and to assess for any other needs. CSW informed patient of resources for assistance with Disability forms (Legal Aid and [facility]). CSW confirmed eligibility for [facility] and completed the referral. CSW discussed with patient, active treatment eligibility requirement for financial resources, patient is not currently in active treatment.  Marguerita Merles, LCSWA Clinical Social Worker [facility]

## 2023-06-21 ENCOUNTER — Other Ambulatory Visit: Payer: Self-pay

## 2023-06-27 ENCOUNTER — Other Ambulatory Visit: Payer: Self-pay

## 2023-07-02 NOTE — Therapy (Signed)
OUTPATIENT PHYSICAL THERAPY  UPPER EXTREMITY ONCOLOGY EVALUATION  Patient Name: BRETTE PICCINI MRN: [id number] DOB:11-23-1990, 32 y.o., female Today's Date: 07/02/2023  END OF SESSION:   Past Medical History:  Diagnosis Date   Arthritis    Bilateral Knees   Cancer (HCC) 03/11/2022   Breast   Family history of breast cancer 03/21/2022   Family history of prostate cancer 03/21/2022   Hypercholesteremia    Juvenile arthritis (HCC)    Neuropathy    Past Surgical History:  Procedure Laterality Date   AXILLARY SENTINEL NODE BIOPSY Left 05/14/2022   Procedure: LEFT AXILLARY SENTINEL NODE BIOPSY;  Surgeon: [name], MD;  Location: [facility] OR;  Service: General;  Laterality: Left;  GEN w/ PEC BLOCK   BREAST BIOPSY Left 03/11/2022   BREAST LUMPECTOMY WITH AXILLARY LYMPH NODE BIOPSY Left 04/16/2022   Procedure: LEFT BREAST LUMPECTOMY WITH LEFT AXILLARY SENTINEL LYMPH NODE BIOPSY;  Surgeon: [name], MD;  Location: [facility];  Service: General;  Laterality: Left;   DENTAL SURGERY     PORTACATH PLACEMENT Right 04/16/2022   Procedure: INSERTION PORT-A-CATH;  Surgeon: [name], MD;  Location: [facility];  Service: General;  Laterality: Right;   Patient Active Problem List   Diagnosis Date Noted   History of juvenile arthritis 04/10/2023   Nausea with vomiting 09/19/2022   Joint pain 09/19/2022   Encounter for antineoplastic chemotherapy 09/19/2022   Peripheral neuropathy 09/19/2022   Hypokalemia 09/19/2022   Port-A-Cath in place 06/14/2022   Genetic testing 03/29/2022   Family history of breast cancer 03/21/2022   Family history of prostate cancer 03/21/2022   Malignant neoplasm of upper-outer quadrant of left breast in female, estrogen receptor negative (HCC) 03/15/2022    PCP: Marland Kitchen  REFERRING PROVIDER: Santiago Glad, NP  REFERRING DIAG: Left breast Lymphedema, deconditioning  THERAPY DIAG:  No diagnosis found.  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 Attempted to do SLNB but only fibroadipose tissue was removed. Returned to sx for removal of 4 negative LNs 05/14/22 . She had chemotherapy and immunotherapy that was not well tolerated and radiation.   PAIN:  Are you having pain? {yes/no:20286} NPRS scale: ***/10 Pain location: *** Pain orientation: {Pain Orientation:25161}  PAIN TYPE: {type:313116} Pain  description: {PAIN DESCRIPTION:21022940}  Aggravating factors: *** Relieving factors: ***  PRECAUTIONS: Left UE lymphedema risk, JRA, CIPN  RED FLAGS: {PT Red Flags:29287}   WEIGHT BEARING RESTRICTIONS: No  FALLS:  Has patient fallen in last 6 months? {fallsyesno:27318}  LIVING ENVIRONMENT: Lives with: {[facility] lives with:25569::"lives with their family"} Lives in: {Lives in:25570} Stairs: {yes/no:20286}; {Stairs:24000} Has following equipment at home: {Assistive devices:23999}  OCCUPATION: ***  LEISURE: ***  HAND DOMINANCE: right   PRIOR LEVEL OF FUNCTION: {PLOF:24004}  PATIENT GOALS: ***   OBJECTIVE:  COGNITION: Overall cognitive status: Within functional limits for tasks assessed   PALPATION: ***  OBSERVATIONS / OTHER ASSESSMENTS: ***  SENSATION: Light touch: {intact/deficits:24005}   POSTURE: forward head, rounded shoulders  UPPER EXTREMITY AROM/PROM:  A/PROM RIGHT   eval   Shoulder extension   Shoulder flexion   Shoulder abduction   Shoulder internal rotation   Shoulder external rotation     (Blank rows = not tested)  A/PROM LEFT   eval  Shoulder extension   Shoulder  flexion   Shoulder abduction   Shoulder internal rotation   Shoulder external rotation     (Blank rows = not tested)  CERVICAL AROM: All within normal limits:    Percent limited  Flexion   Extension   Right lateral flexion   Left lateral flexion   Right rotation   Left rotation     UPPER EXTREMITY STRENGTH:   LYMPHEDEMA ASSESSMENTS:   SURGERY TYPE/DATE: Left lumpectomy 04/16/2022, Triple -  NUMBER OF LYMPH NODES REMOVED: 0/4  CHEMOTHERAPY: YES and immunotherapy stopped due to low tolerance  RADIATION:YES, 01/16/23-03/06/2023  HORMONE TREATMENT: NO  INFECTIONS: ***   LYMPHEDEMA ASSESSMENTS:   LANDMARK RIGHT  eval  At axilla    15 cm proximal to olecranon process   10 cm proximal to olecranon process   Olecranon process   15 cm proximal to ulnar styloid process   10 cm proximal to ulnar styloid process   Just proximal to ulnar styloid process   Across hand at thumb web space   At base of 2nd digit   (Blank rows = not tested)  LANDMARK LEFT  eval  At axilla    15 cm proximal to olecranon process   10 cm proximal to olecranon process   Olecranon process   15 cm proximal to ulnar styloid process   10 cm proximal to ulnar styloid process   Just proximal to ulnar styloid process   Across hand at thumb web space   At base of 2nd digit   (Blank rows = not tested)   FUNCTIONAL TESTS:  {Functional tests:24029}  GAIT: Distance walked: *** Assistive device utilized: {Assistive devices:23999} Level of assistance: {Levels of assistance:24026} Comments: ***  L-DEX LYMPHEDEMA SCREENING: The patient was assessed using the L-Dex machine today to produce a lymphedema index baseline score. The patient will be reassessed on a regular basis (typically every 3 months) to obtain new L-Dex scores. If the score is > 6.5 points away from his/her baseline score indicating onset of subclinical lymphedema, it will be recommended to wear a compression garment for 4 weeks, 12  hours per day and then be reassessed. If the score continues to be > 6.5 points from baseline at reassessment, we will initiate lymphedema treatment. Assessing in this manner has a 95% rate of preventing clinically significant lymphedema.  QUICK DASH SURVEY: ***   TODAY'S TREATMENT:  DATE: ***    PATIENT EDUCATION:  Education details: *** Person educated: {Person educated:25204} Education method: {Education Method:25205} Education comprehension: {Education Comprehension:25206}  HOME EXERCISE PROGRAM: ***  ASSESSMENT:  CLINICAL IMPRESSION: Patient is a 32 y.o. female who was seen today for physical therapy evaluation and treatment for complaints of left breast swelling with pain and deconditioning due to Left breast surgery and treatments.    OBJECTIVE IMPAIRMENTS: {opptimpairments:25111}.   ACTIVITY LIMITATIONS: {activitylimitations:27494}  PARTICIPATION LIMITATIONS: {participationrestrictions:25113}  PERSONAL FACTORS: {Personal factors:25162} are also affecting patient's functional outcome.   REHAB POTENTIAL: Good  CLINICAL DECISION MAKING: Stable/uncomplicated  EVALUATION COMPLEXITY: Low  GOALS: Goals reviewed with patient? Yes  SHORT TERM GOALS: Target date: ***  Pt will have decreased left breast pain by 25% Baseline: Goal status: INITIAL  2.  Pt will have decreased  Baseline:  Goal status: INITIAL  3.  *** Baseline:  Goal status: INITIAL  4.  *** Baseline:  Goal status: INITIAL  5.  *** Baseline:  Goal status: INITIAL  6.  *** Baseline:  Goal status: INITIAL  LONG TERM GOALS: Target date: ***  *** Baseline:  Goal status: INITIAL  2.  *** Baseline:  Goal status: INITIAL  3.  *** Baseline:  Goal status: INITIAL  4.  *** Baseline:  Goal status: INITIAL  5.  *** Baseline:  Goal status:  INITIAL  6.  *** Baseline:  Goal status: INITIAL  PLAN:  PT FREQUENCY: {rehab frequency:25116}  PT DURATION: {rehab duration:25117}  PLANNED INTERVENTIONS: {rehab planned interventions:25118::"Therapeutic exercises","Therapeutic activity","Neuromuscular re-education","Balance training","Gait training","Patient/Family education","Self Care","Joint mobilization"}  PLAN FOR NEXT SESSION: ***  Waynette Buttery, PT 07/02/2023, 6:06 PM

## 2023-07-03 ENCOUNTER — Other Ambulatory Visit: Payer: Self-pay

## 2023-07-03 ENCOUNTER — Ambulatory Visit: Payer: Medicaid Other | Attending: Nurse Practitioner

## 2023-07-03 DIAGNOSIS — I890 Lymphedema, not elsewhere classified: Secondary | ICD-10-CM

## 2023-07-03 DIAGNOSIS — Z923 Personal history of irradiation: Secondary | ICD-10-CM | POA: Insufficient documentation

## 2023-07-03 DIAGNOSIS — M25612 Stiffness of left shoulder, not elsewhere classified: Secondary | ICD-10-CM

## 2023-07-03 DIAGNOSIS — R5381 Other malaise: Secondary | ICD-10-CM | POA: Diagnosis present

## 2023-07-03 DIAGNOSIS — T451X5A Adverse effect of antineoplastic and immunosuppressive drugs, initial encounter: Secondary | ICD-10-CM | POA: Insufficient documentation

## 2023-07-03 DIAGNOSIS — Z9221 Personal history of antineoplastic chemotherapy: Secondary | ICD-10-CM | POA: Diagnosis not present

## 2023-07-03 DIAGNOSIS — Z9181 History of falling: Secondary | ICD-10-CM | POA: Insufficient documentation

## 2023-07-03 DIAGNOSIS — R262 Difficulty in walking, not elsewhere classified: Secondary | ICD-10-CM

## 2023-07-03 DIAGNOSIS — C50412 Malignant neoplasm of upper-outer quadrant of left female breast: Secondary | ICD-10-CM | POA: Insufficient documentation

## 2023-07-03 DIAGNOSIS — I972 Postmastectomy lymphedema syndrome: Secondary | ICD-10-CM | POA: Diagnosis not present

## 2023-07-03 DIAGNOSIS — Z483 Aftercare following surgery for neoplasm: Secondary | ICD-10-CM

## 2023-07-03 DIAGNOSIS — M6281 Muscle weakness (generalized): Secondary | ICD-10-CM

## 2023-07-03 DIAGNOSIS — G620 Drug-induced polyneuropathy: Secondary | ICD-10-CM | POA: Diagnosis not present

## 2023-07-03 DIAGNOSIS — Z171 Estrogen receptor negative status [ER-]: Secondary | ICD-10-CM | POA: Diagnosis present

## 2023-07-10 ENCOUNTER — Ambulatory Visit: Payer: Medicaid Other

## 2023-07-10 DIAGNOSIS — I890 Lymphedema, not elsewhere classified: Secondary | ICD-10-CM

## 2023-07-10 DIAGNOSIS — G620 Drug-induced polyneuropathy: Secondary | ICD-10-CM | POA: Diagnosis not present

## 2023-07-10 DIAGNOSIS — R262 Difficulty in walking, not elsewhere classified: Secondary | ICD-10-CM

## 2023-07-10 DIAGNOSIS — Z483 Aftercare following surgery for neoplasm: Secondary | ICD-10-CM

## 2023-07-10 DIAGNOSIS — Z171 Estrogen receptor negative status [ER-]: Secondary | ICD-10-CM

## 2023-07-10 DIAGNOSIS — M6281 Muscle weakness (generalized): Secondary | ICD-10-CM

## 2023-07-10 DIAGNOSIS — M25612 Stiffness of left shoulder, not elsewhere classified: Secondary | ICD-10-CM

## 2023-07-10 NOTE — Therapy (Addendum)
OUTPATIENT PHYSICAL THERAPY  ONCOLOGY TREATMENT  Patient Name: Gabrielle [name] MRN: [id number] DOB:1991/02/07, 32 y.o., female Today's Date: 07/10/2023  END OF SESSION:  PT End of Session - 07/10/23 0918     Visit Number 2    Number of Visits 16    Date for PT Re-Evaluation 08/28/23    PT Start Time 0917   pt arrived late   PT Stop Time 1002    PT Time Calculation (min) 45 min    Activity Tolerance Patient tolerated treatment well    Behavior During Therapy [facility] for tasks assessed/performed             Past Medical History:  Diagnosis Date   Arthritis    Bilateral Knees   Cancer (HCC) 03/11/2022   Breast   Family history of breast cancer 03/21/2022   Family history of prostate cancer 03/21/2022   Hypercholesteremia    Juvenile arthritis (HCC)    Neuropathy    Past Surgical History:  Procedure Laterality Date   AXILLARY SENTINEL NODE BIOPSY Left 05/14/2022   Procedure: LEFT AXILLARY SENTINEL NODE BIOPSY;  Surgeon: [name], MD;  Location: [facility] OR;  Service: General;  Laterality: Left;  GEN w/ PEC BLOCK   BREAST BIOPSY Left 03/11/2022   BREAST LUMPECTOMY WITH AXILLARY LYMPH NODE BIOPSY Left 04/16/2022   Procedure: LEFT BREAST LUMPECTOMY WITH LEFT AXILLARY SENTINEL LYMPH NODE BIOPSY;  Surgeon: [name], MD;  Location: [facility];  Service: General;  Laterality: Left;   DENTAL SURGERY     PORTACATH PLACEMENT Right 04/16/2022   Procedure: INSERTION PORT-A-CATH;  Surgeon: [name], MD;  Location: [facility];  Service: General;  Laterality: Right;   Patient Active Problem List   Diagnosis Date Noted   History of juvenile arthritis 04/10/2023   Nausea with vomiting 09/19/2022   Joint pain 09/19/2022   Encounter for antineoplastic chemotherapy 09/19/2022   Peripheral neuropathy 09/19/2022   Hypokalemia 09/19/2022   Port-A-Cath in place 06/14/2022   Genetic testing 03/29/2022   Family history of breast cancer  03/21/2022   Family history of prostate cancer 03/21/2022   Malignant neoplasm of upper-outer quadrant of left breast in female, estrogen receptor negative (HCC) 03/15/2022     REFERRING PROVIDER: Santiago Glad, NP  REFERRING DIAG: Left breast Lymphedema, deconditioning  THERAPY DIAG:  Malignant neoplasm of upper-outer quadrant of left breast in female, estrogen receptor negative (HCC)  Aftercare following surgery for neoplasm  Lymphedema, not elsewhere classified  Muscle weakness (generalized)  Difficulty walking  Stiffness of left shoulder, not elsewhere classified  ONSET DATE: 03/06/2023  Rationale for Evaluation and Treatment: Rehabilitation  SUBJECTIVE:  SUBJECTIVE STATEMENT:  Nothing new since eval. I just feel so tight in the morning and when I first get up from sitting. My feet always hurt and Rt knee.   Eval: At night when in the bed her legs ache a lot even with the gabapentin, but mainly at night.  Feet are very painful. She  feels stiff from the knees down. If sitting greater than 5 minutes I feel really stiff. I can't straighten up my back with standing. Breast swelling started after radiation and joint pain started with the Keytruda. Breast has shooting pain intermittently, and she notices firm spots, and enlarged pores. The breast feels heavy but it is not tender. The leg discomfort makes stairs difficult, getting up from low furniture is hard. Sometimes I need help. I fall a lot because my legs get twisted up. My legs and my feet swell up. Sometimes I walk with a cane.I use a motorized scooter in stores.   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 Attempted to do SLNB but only  fibroadipose tissue was removed. Returned to sx for removal of 4 negative LNs 05/14/22 . She had chemotherapy and immunotherapy that was not well tolerated and radiation. Presently having difficulty with neuropathy in her feet, and stiffness pain, especially knees down with trouble standing erect and numerous falls.   PAIN:  Are you having pain? Yes NPRS scale: 8/10 Pain location: feet and Rt knee Pain orientation: Bilateral  PAIN TYPE: burning Pain description: constant  Aggravating factors: standing, walking Relieving factors: gabapentin, rest  PRECAUTIONS: Left UE lymphedema risk, JRA, CIPN, FALL RISK  RED FLAGS: None   WEIGHT BEARING RESTRICTIONS: No, but pt is a significant fall risk  FALLS:  Has patient fallen in last 6 months? Yes. Number of falls 4  LIVING ENVIRONMENT: Lives with:  her sister, her 92 yr old son and sisters friend and daughter, and cousin Lives in: House/apartment Stairs: Yes; External: 20 steps; bilateral but cannot reach both Has following equipment at home: Single point cane and None  OCCUPATION: not working  LEISURE: read, play games, crosswords  HAND DOMINANCE: right   PRIOR LEVEL OF FUNCTION: Independent with household mobility with device  PATIENT GOALS: Decrease breast swelling, Get legs stronger   OBJECTIVE:  COGNITION: Overall cognitive status: Within functional limits for tasks assessed   PALPATION: Fibrosis especially left medial breast mid to lower  OBSERVATIONS / OTHER ASSESSMENTS: hyperpigmentation, Peau d'orange, fibrosis especially left medial breast superior to inferior,thickened skin  SENSATION: Light touch: Deficits especially feet   POSTURE: significant forward head, rounded shoulders, very flexed posture  UPPER EXTREMITY AROM/PROM:  A/PROM RIGHT   eval   Shoulder extension   Shoulder flexion 135  Shoulder abduction 145  Shoulder internal rotation   Shoulder external rotation     (Blank rows = not  tested)  A/PROM LEFT   eval  Shoulder extension   Shoulder flexion 105  Shoulder abduction 80  Shoulder internal rotation   Shoulder external rotation     (Blank rows = not tested)  CERVICAL AROM: All within normal limits:     UPPER EXTREMITY STRENGTH:   LYMPHEDEMA ASSESSMENTS:   SURGERY TYPE/DATE: Left lumpectomy 04/16/2022, Triple -  NUMBER OF LYMPH NODES REMOVED: 0/4  CHEMOTHERAPY: YES and immunotherapy stopped due to low tolerance  RADIATION:YES, 01/16/23-03/06/2023  HORMONE TREATMENT: NO  INFECTIONS: NO   LYMPHEDEMA ASSESSMENTS:   LANDMARK RIGHT  eval  At axilla  15 cm proximal to olecranon process   10 cm proximal to olecranon process   Olecranon process   15 cm proximal to ulnar styloid process   10 cm proximal to ulnar styloid process   Just proximal to ulnar styloid process   Across hand at thumb web space   At base of 2nd digit   (Blank rows = not tested)  LANDMARK LEFT  eval  At axilla    15 cm proximal to olecranon process   10 cm proximal to olecranon process   Olecranon process   15 cm proximal to ulnar styloid process   10 cm proximal to ulnar styloid process   Just proximal to ulnar styloid process   Across hand at thumb web space   At base of 2nd digit   (Blank rows = not tested)   FUNCTIONAL TESTS:  Sit to stand;very slow, use of bilateral UE's, flexed posture  GAIT: Distance walked: slow and difficult rising from chair, slow, very flexed posture Assistive device utilized: no AD device today but usually uses a cane.  Uses a motorized cart in stores Level of assistance:  Needs to be SBA to min A for safety Comments:   L-DEX LYMPHEDEMA SCREENING: The patient was assessed using the L-Dex machine today to produce a lymphedema index baseline score. The patient will be reassessed on a regular basis (typically every 3 months) to obtain new L-Dex scores. If the score is > 6.5 points away from his/her baseline score indicating onset of  subclinical lymphedema, it will be recommended to wear a compression garment for 4 weeks, 12 hours per day and then be reassessed. If the score continues to be > 6.5 points from baseline at reassessment, we will initiate lymphedema treatment. Assessing in this manner has a 95% rate of preventing clinically significant lymphedema.  QUICK DASH SURVEY:  BREAST COMPLAINTS QUESTIONNAIRE Pain:6 Heaviness:5 Swollen feeling:6 Tense Skin:5 Redness:0 Bra Print:6 Size of Pores:7 Hard feeling: 7 Total:   42  /80 A Score over 9 indicates lymphedema issues in the breast   TODAY'S TREATMENT:                                                                                                                                          DATE:  07/10/23: Therapeutic Exercises Supine: Bridges x 10 SLR 3 x 5 bil SAQ x 10 each leg Tried bil prirformis stretch but increased bil knee pain so stopped  Butterfly stretch x 3 , 20 sec holds Seated: Alt LAQ 3 x 5 Trial of bil piriformis stretch but increased knee pain so stopped Sit to stand x 5 returning therapist demo and VC's to stand fully erect  Hip adduction with bolster squeeze x 10 Standing at counter: Mini squats 2 x 5 with demo and VC's to stand fully erect after each rep by squeezing gluts to return to standing NuStep Level 4  x 5 mins seat 11/UE 8  07/03/2023 Discussed POC: address shoulder ROM, Breast edema, LE weakness, balance. Script and information given to get compression bra, Discussed AD for safety    PATIENT EDUCATION:  Education details: Bil LE strength in varying positions and HS stretch in sitting  Person educated: Patient Education method: Explanation and Handouts, demonstration and VC's Education comprehension: verbalized understanding, returned demonstration, VC's and pt will benefit from further review  HOME EXERCISE PROGRAM: 07/10/23: Bil LE strength and flexibility, see below  ASSESSMENT:  CLINICAL IMPRESSION: First session of  beginning exercises and starting pt on HEP. Pt arrived late with 43 y/o son so did not do breast MLD today. She reports he starts school next week so we can begin breat MLD then as he won't be in session with her. She was able to complete all exercises today except piriformis stretch due to increased knee pain in sitting and supine. Pt was standing more erect by end of session and reports that she felt looser than when she arrived and she seemed encouraged by this. Encouraged her that even though she is feeling pain and tightness throughout day she can just do low reps of exercises throughout day so by end of day she has done a higher amount of reps. This will lead to increased strength and endurance and eventually being able to tolerate more at a time. Pt able to verbalize good understanding of all instruction today.   OBJECTIVE IMPAIRMENTS: Abnormal gait, decreased activity tolerance, decreased balance, decreased endurance, decreased knowledge of condition, decreased mobility, difficulty walking, decreased ROM, decreased strength, increased edema, impaired sensation, impaired UE functional use, postural dysfunction, and pain.   ACTIVITY LIMITATIONS: carrying, bending, standing, squatting, sleeping, bathing, reach over head, and locomotion level  PARTICIPATION LIMITATIONS: meal prep, cleaning, laundry, community activity, and occupation  PERSONAL FACTORS: 3+ comorbidities: Triple Negative Breast Cancer s/p radiation and chemotherapy, immunotherapy, JRA,neuropathy  are also affecting patient's functional outcome.   REHAB POTENTIAL: Good  CLINICAL DECISION MAKING: Stable/uncomplicated  EVALUATION COMPLEXITY: Low  GOALS: Goals reviewed with patient? Yes  SHORT TERM GOALS: Target date: 07/31/2023  Pt will have decreased left breast pain /swelling by 25% Baseline: Goal status: INITIAL  2.  Pt will have improved left shoulder ROM flexion by atleast 15 degrees and abd by atleast 20 degrees   Baseline: 105/80 Goal status: INITIAL  3.  Pt will purchase compression bra for control of left breast Lymphedema Baseline:  Goal status: INITIAL  4.  Pt will be independent in a HEP for shoulder ROM and strength and LE strength Baseline:  Goal status: INITIAL   LONG TERM GOALS: Target date: 08/28/2023  Pt will have decreased left breast pain/swelling x 50% Baseline:  Goal status: INITIAL  2.  Pt will have Left shoulder flexion and abd increased to 120 degrees for improved reach Baseline:  Goal status: INITIAL  3.  Pt will be able to perform sit to stand from regular height chair without use of hands Baseline:  Goal status: INITIAL  4.  Pt will ambulate short household distances with more erect gait and confidence in balance Baseline:  Goal status: INITIAL  5.  Pt will be independent in left breast MLD Baseline:  Goal status: INITIAL  6.  Pt will be able to do short periods of shopping pushing a shopping cart instead of using motorized scooter Baseline:  Goal status: INITIAL  PLAN:  PT FREQUENCY: 2x/week  PT DURATION: 8 weeks  PLANNED INTERVENTIONS: Therapeutic exercises, Therapeutic  activity, Neuromuscular re-education, Balance training, Gait training, Patient/Family education, Self Care, Joint mobilization, Orthotic/Fit training, Manual lymph drainage, Manual therapy, and Re-evaluation  PLAN FOR NEXT SESSION: did pt get compression bra,? initiate shoulder AAROM, Breast MLD, instruct self MLD, cont NU step for leg strength, cont LE strength and review and progress HEP adding bil hip 3 way raises standing at counter Interested in Flexitouch?  Hermenia Bers, PTA 07/10/2023, 10:20 AM  Azell Der back, legs bent. Inhale, pressing hips up. Keeping ribs in, lengthen lower back. Exhale, rolling down along spine from top. Repeat __10__ times. Do __2__ sessions per day.  Straight Leg Raise    Tighten stomach and slowly raise locked right leg 6-10  inches from floor. Then do same with left. Repeat _5-10___ times per set. Do __2-3__ sets per session. Do __2__ sessions per day.   Short Arc [employer] a large can or rolled towel under leg. Straighten knee and leg. Hold __3-5__ seconds. Repeat with other leg. Repeat __10-20__ times. Do __2__ sessions per day.  KNEE: Extension, Long Arc Quads - Sitting    Raise leg until knee is straight. _10__ reps per set, __2-3_ sets per day.   Hip squeeze with folded pillow between knees.  Hold 5 seconds and repeat 20 times.   Hamstring Stretch    Inhale and straighten spine. Exhale and lean forward toward extended leg. Hold position for _20-30__ seconds. Inhale and come back to center. Repeat with other leg extended. Repeat __3_ times, alternating legs. Do _2-3__ times per day.  Can also try butterfly stretch when laying down. Slide heels toward butt and let knees fall apart for inner thigh stretch.  Hold 20 seconds and repeat 5 times.   SIT TO STAND: Foam    Feet: shoulder-width. Lean chest forward and arms out in front. Stand tall and straighten knees to stand finishing with squeezing butt together as you stand all the way up..  __5-10_ reps per set, __2-3_ sets per day.   Do Mini Squats at counter with hands lightly on counter and "start to sit down" only going as low as you can control. To return to standing squeeze butt together and stand all the way up. Repeat 10 times, 2 sets.   Cancer Rehab [phone]

## 2023-07-14 ENCOUNTER — Encounter: Payer: Self-pay | Admitting: Nurse Practitioner

## 2023-07-15 ENCOUNTER — Ambulatory Visit: Payer: Medicaid Other

## 2023-07-15 DIAGNOSIS — R262 Difficulty in walking, not elsewhere classified: Secondary | ICD-10-CM

## 2023-07-15 DIAGNOSIS — M6281 Muscle weakness (generalized): Secondary | ICD-10-CM

## 2023-07-15 DIAGNOSIS — Z483 Aftercare following surgery for neoplasm: Secondary | ICD-10-CM

## 2023-07-15 DIAGNOSIS — Z171 Estrogen receptor negative status [ER-]: Secondary | ICD-10-CM

## 2023-07-15 DIAGNOSIS — M25612 Stiffness of left shoulder, not elsewhere classified: Secondary | ICD-10-CM

## 2023-07-15 DIAGNOSIS — I890 Lymphedema, not elsewhere classified: Secondary | ICD-10-CM

## 2023-07-15 DIAGNOSIS — G620 Drug-induced polyneuropathy: Secondary | ICD-10-CM | POA: Diagnosis not present

## 2023-07-15 NOTE — Therapy (Signed)
OUTPATIENT PHYSICAL THERAPY  ONCOLOGY TREATMENT  Patient Name: Gabrielle [name] MRN: [id number] DOB:May 12, 1991, 32 y.o., female Today's Date: 07/15/2023  END OF SESSION:  PT End of Session - 07/15/23 1108     Visit Number 3    Number of Visits 16    Date for PT Re-Evaluation 08/28/23    PT Start Time 1104    PT Stop Time 1145    PT Time Calculation (min) 41 min    Activity Tolerance Patient tolerated treatment well    Behavior During Therapy [facility] for tasks assessed/performed             Past Medical History:  Diagnosis Date   Arthritis    Bilateral Knees   Cancer (HCC) 03/11/2022   Breast   Family history of breast cancer 03/21/2022   Family history of prostate cancer 03/21/2022   Hypercholesteremia    Juvenile arthritis (HCC)    Neuropathy    Past Surgical History:  Procedure Laterality Date   AXILLARY SENTINEL NODE BIOPSY Left 05/14/2022   Procedure: LEFT AXILLARY SENTINEL NODE BIOPSY;  Surgeon: [name], MD;  Location: [facility] OR;  Service: General;  Laterality: Left;  GEN w/ PEC BLOCK   BREAST BIOPSY Left 03/11/2022   BREAST LUMPECTOMY WITH AXILLARY LYMPH NODE BIOPSY Left 04/16/2022   Procedure: LEFT BREAST LUMPECTOMY WITH LEFT AXILLARY SENTINEL LYMPH NODE BIOPSY;  Surgeon: [name], MD;  Location: [facility];  Service: General;  Laterality: Left;   DENTAL SURGERY     PORTACATH PLACEMENT Right 04/16/2022   Procedure: INSERTION PORT-A-CATH;  Surgeon: [name], MD;  Location: [facility];  Service: General;  Laterality: Right;   Patient Active Problem List   Diagnosis Date Noted   History of juvenile arthritis 04/10/2023   Nausea with vomiting 09/19/2022   Joint pain 09/19/2022   Encounter for antineoplastic chemotherapy 09/19/2022   Peripheral neuropathy 09/19/2022   Hypokalemia 09/19/2022   Port-A-Cath in place 06/14/2022   Genetic testing 03/29/2022   Family history of breast cancer 03/21/2022   Family  history of prostate cancer 03/21/2022   Malignant neoplasm of upper-outer quadrant of left breast in female, estrogen receptor negative (HCC) 03/15/2022     REFERRING PROVIDER: Santiago Glad, NP  REFERRING DIAG: Left breast Lymphedema, deconditioning  THERAPY DIAG:  Malignant neoplasm of upper-outer quadrant of left breast in female, estrogen receptor negative (HCC)  Aftercare following surgery for neoplasm  Lymphedema, not elsewhere classified  Muscle weakness (generalized)  Difficulty walking  Stiffness of left shoulder, not elsewhere classified  ONSET DATE: 03/06/2023  Rationale for Evaluation and Treatment: Rehabilitation  SUBJECTIVE:  SUBJECTIVE STATEMENT:  I feel so much better than when I was here last. That day when I left my bones weren't hurting as much either. I've been doing the exercises 2x/day. My feet feel better, they don't hurt anymore and my Rt knee isn't hurting. I haven't even taken my gabapentin since I was here last because my legs didn't hurt.   Eval: At night when in the bed her legs ache a lot even with the gabapentin, but mainly at night.  Feet are very painful. She  feels stiff from the knees down. If sitting greater than 5 minutes I feel really stiff. I can't straighten up my back with standing. Breast swelling started after radiation and joint pain started with the Keytruda. Breast has shooting pain intermittently, and she notices firm spots, and enlarged pores. The breast feels heavy but it is not tender. The leg discomfort makes stairs difficult, getting up from low furniture is hard. Sometimes I need help. I fall a lot because my legs get twisted up. My legs and my feet swell up. Sometimes I walk with a cane.I use a motorized scooter in stores.   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 Attempted to do SLNB but only fibroadipose tissue was removed. Returned to sx for removal of 4 negative LNs 05/14/22 . She had chemotherapy and immunotherapy that was not well tolerated and radiation. Presently having difficulty with neuropathy in her feet, and stiffness pain, especially knees down with trouble standing erect and numerous falls.   PAIN:  Are you having pain? No   PRECAUTIONS: Left UE lymphedema risk, JRA, CIPN, FALL RISK  RED FLAGS: None   WEIGHT BEARING RESTRICTIONS: No, but pt is a significant fall risk  FALLS:  Has patient fallen in last 6 months? Yes. Number of falls 4  LIVING ENVIRONMENT: Lives with:  her sister, her 42 yr old son and sisters friend and daughter, and cousin Lives in: House/apartment Stairs: Yes; External: 20 steps; bilateral but cannot reach both Has following equipment at home: Single point cane and None  OCCUPATION: not working  LEISURE: read, play games, crosswords  HAND DOMINANCE: right   PRIOR LEVEL OF FUNCTION: Independent with household mobility with device  PATIENT GOALS: Decrease breast swelling, Get legs stronger   OBJECTIVE:  COGNITION: Overall cognitive status: Within functional limits for tasks assessed   PALPATION: Fibrosis especially left medial breast mid to lower  OBSERVATIONS / OTHER ASSESSMENTS: hyperpigmentation, Peau d'orange, fibrosis especially left medial breast superior to inferior,thickened skin  SENSATION: Light touch: Deficits especially feet   POSTURE: significant forward head, rounded shoulders, very flexed posture  UPPER EXTREMITY AROM/PROM:  A/PROM RIGHT   eval   Shoulder extension   Shoulder flexion 135  Shoulder abduction 145  Shoulder internal rotation   Shoulder external rotation     (Blank rows = not tested)  A/PROM LEFT   eval  Shoulder extension   Shoulder flexion  105  Shoulder abduction 80  Shoulder internal rotation   Shoulder external rotation     (Blank rows = not tested)  CERVICAL AROM: All within normal limits:     UPPER EXTREMITY STRENGTH:   LYMPHEDEMA ASSESSMENTS:   SURGERY TYPE/DATE: Left lumpectomy 04/16/2022, Triple -  NUMBER OF LYMPH NODES REMOVED: 0/4  CHEMOTHERAPY: YES and immunotherapy stopped due to low tolerance  RADIATION:YES, 01/16/23-03/06/2023  HORMONE TREATMENT: NO  INFECTIONS: NO   LYMPHEDEMA ASSESSMENTS:   LANDMARK RIGHT  eval  At axilla    15 cm proximal to olecranon process   10 cm proximal to olecranon process   Olecranon process   15 cm proximal to ulnar styloid process   10 cm proximal to ulnar styloid process   Just proximal to ulnar styloid process   Across hand at thumb web space   At base of 2nd digit   (Blank rows = not tested)  LANDMARK LEFT  eval  At axilla    15 cm proximal to olecranon process   10 cm proximal to olecranon process   Olecranon process   15 cm proximal to ulnar styloid process   10 cm proximal to ulnar styloid process   Just proximal to ulnar styloid process   Across hand at thumb web space   At base of 2nd digit   (Blank rows = not tested)   FUNCTIONAL TESTS:  Sit to stand;very slow, use of bilateral UE's, flexed posture  GAIT: Distance walked: slow and difficult rising from chair, slow, very flexed posture Assistive device utilized: no AD device today but usually uses a cane.  Uses a motorized cart in stores Level of assistance:  Needs to be SBA to min A for safety Comments:   L-DEX LYMPHEDEMA SCREENING: The patient was assessed using the L-Dex machine today to produce a lymphedema index baseline score. The patient will be reassessed on a regular basis (typically every 3 months) to obtain new L-Dex scores. If the score is > 6.5 points away from his/her baseline score indicating onset of subclinical lymphedema, it will be recommended to wear a compression  garment for 4 weeks, 12 hours per day and then be reassessed. If the score continues to be > 6.5 points from baseline at reassessment, we will initiate lymphedema treatment. Assessing in this manner has a 95% rate of preventing clinically significant lymphedema.  QUICK DASH SURVEY:  BREAST COMPLAINTS QUESTIONNAIRE Pain:6 Heaviness:5 Swollen feeling:6 Tense Skin:5 Redness:0 Bra Print:6 Size of Pores:7 Hard feeling: 7 Total:   42  /80 A Score over 9 indicates lymphedema issues in the breast   TODAY'S TREATMENT:                                                                                                                                          DATE:  07/15/23: Therapeutic Exercises NuStep Level 5 x 10 mins seat 11/UE 8, 485 steps  Leg Press 30#, 2 x 10 In // bars: 2# on each ankle to for following: SLS on blue oval for bil hip 3 way raises 2 x 10 with VC's required throughout for erect posture/relax shoulders and correct LE position for abd while performing and after therapist demo Seated edge of mat for est but also used time to stretch: bil HS stretch 2 x, 20 sec each and then piriformis figure 4 stretch 2 x 20 sec each returning therapist demo  and VC's for correct technique In // bars for front heel-toe walking x4 with VC's throughout to remind pt to relax shoulders and for erect posture, then retro x 2  Wall slides x 5, pt reports fatigued at this point and ready to stop for the day.  07/10/23: Therapeutic Exercises Supine: Bridges x 10 SLR 3 x 5 bil SAQ x 10 each leg Tried bil prirformis stretch but increased bil knee pain so stopped  Butterfly stretch x 3 , 20 sec holds Seated: Alt LAQ 3 x 5 Trial of bil piriformis stretch but increased knee pain so stopped Sit to stand x 5 returning therapist demo and VC's to stand fully erect  Hip adduction with bolster squeeze x 10 Standing at counter: Mini squats 2 x 5 with demo and VC's to stand fully erect after each rep by squeezing  gluts to return to standing NuStep Level 4 x 5 mins seat 11/UE 8  07/03/2023 Discussed POC: address shoulder ROM, Breast edema, LE weakness, balance. Script and information given to get compression bra, Discussed AD for safety    PATIENT EDUCATION:  Education details: Bil LE strength in varying positions and HS stretch in sitting  Person educated: Patient Education method: Explanation and Handouts, demonstration and VC's Education comprehension: verbalized understanding, returned demonstration, VC's and pt will benefit from further review  HOME EXERCISE PROGRAM: 07/10/23: Bil LE strength and flexibility, see below  ASSESSMENT:  CLINICAL IMPRESSION: Pt comes in doing much better than at last session. Still ambulates with a slow, antalgic gait but does not have her walker and reports has hardly used it since she was here last. Also her bil feet pain is gone and she hasn't taken her gabapentin. She has been compliant so far with her initial HEP and did well during session today. Tolerated increased resistance and time on Nustep and was able to tolerate progression of activities and exercises from last session. Pt is very pleased with her progress thus far and happy she is feeling better.    OBJECTIVE IMPAIRMENTS: Abnormal gait, decreased activity tolerance, decreased balance, decreased endurance, decreased knowledge of condition, decreased mobility, difficulty walking, decreased ROM, decreased strength, increased edema, impaired sensation, impaired UE functional use, postural dysfunction, and pain.   ACTIVITY LIMITATIONS: carrying, bending, standing, squatting, sleeping, bathing, reach over head, and locomotion level  PARTICIPATION LIMITATIONS: meal prep, cleaning, laundry, community activity, and occupation  PERSONAL FACTORS: 3+ comorbidities: Triple Negative Breast Cancer s/p radiation and chemotherapy, immunotherapy, JRA,neuropathy  are also affecting patient's functional outcome.    REHAB POTENTIAL: Good  CLINICAL DECISION MAKING: Stable/uncomplicated  EVALUATION COMPLEXITY: Low  GOALS: Goals reviewed with patient? Yes  SHORT TERM GOALS: Target date: 07/31/2023  Pt will have decreased left breast pain /swelling by 25% Baseline: Goal status: INITIAL  2.  Pt will have improved left shoulder ROM flexion by atleast 15 degrees and abd by atleast 20 degrees  Baseline: 105/80 Goal status: INITIAL  3.  Pt will purchase compression bra for control of left breast Lymphedema Baseline:  Goal status: INITIAL  4.  Pt will be independent in a HEP for shoulder ROM and strength and LE strength Baseline:  Goal status: INITIAL   LONG TERM GOALS: Target date: 08/28/2023  Pt will have decreased left breast pain/swelling x 50% Baseline:  Goal status: INITIAL  2.  Pt will have Left shoulder flexion and abd increased to 120 degrees for improved reach Baseline:  Goal status: INITIAL  3.  Pt will be able to perform  sit to stand from regular height chair without use of hands Baseline:  Goal status: INITIAL  4.  Pt will ambulate short household distances with more erect gait and confidence in balance Baseline:  Goal status: INITIAL  5.  Pt will be independent in left breast MLD Baseline:  Goal status: INITIAL  6.  Pt will be able to do short periods of shopping pushing a shopping cart instead of using motorized scooter Baseline:  Goal status: INITIAL  PLAN:  PT FREQUENCY: 2x/week  PT DURATION: 8 weeks  PLANNED INTERVENTIONS: Therapeutic exercises, Therapeutic activity, Neuromuscular re-education, Balance training, Gait training, Patient/Family education, Self Care, Joint mobilization, Orthotic/Fit training, Manual lymph drainage, Manual therapy, and Re-evaluation  PLAN FOR NEXT SESSION: did pt get compression bra,? initiate shoulder AAROM, Begin Breast MLD and instruct self MLD, cont NU step for leg strength, cont LE strength and review and progress HEP  adding bil hip 3 way raises standing at counter Interested in Flexitouch?  Hermenia Bers, PTA 07/15/2023, 11:52 AM  Azell Der back, legs bent. Inhale, pressing hips up. Keeping ribs in, lengthen lower back. Exhale, rolling down along spine from top. Repeat __10__ times. Do __2__ sessions per day.  Straight Leg Raise    Tighten stomach and slowly raise locked right leg 6-10 inches from floor. Then do same with left. Repeat _5-10___ times per set. Do __2-3__ sets per session. Do __2__ sessions per day.   Short Arc [employer] a large can or rolled towel under leg. Straighten knee and leg. Hold __3-5__ seconds. Repeat with other leg. Repeat __10-20__ times. Do __2__ sessions per day.  KNEE: Extension, Long Arc Quads - Sitting    Raise leg until knee is straight. _10__ reps per set, __2-3_ sets per day.   Hip squeeze with folded pillow between knees.  Hold 5 seconds and repeat 20 times.   Hamstring Stretch    Inhale and straighten spine. Exhale and lean forward toward extended leg. Hold position for _20-30__ seconds. Inhale and come back to center. Repeat with other leg extended. Repeat __3_ times, alternating legs. Do _2-3__ times per day.  Can also try butterfly stretch when laying down. Slide heels toward butt and let knees fall apart for inner thigh stretch.  Hold 20 seconds and repeat 5 times.   SIT TO STAND: Foam    Feet: shoulder-width. Lean chest forward and arms out in front. Stand tall and straighten knees to stand finishing with squeezing butt together as you stand all the way up..  __5-10_ reps per set, __2-3_ sets per day.   Do Mini Squats at counter with hands lightly on counter and "start to sit down" only going as low as you can control. To return to standing squeeze butt together and stand all the way up. Repeat 10 times, 2 sets.   Cancer Rehab [phone]

## 2023-07-17 ENCOUNTER — Ambulatory Visit: Payer: Medicaid Other

## 2023-07-17 DIAGNOSIS — M6281 Muscle weakness (generalized): Secondary | ICD-10-CM

## 2023-07-17 DIAGNOSIS — R262 Difficulty in walking, not elsewhere classified: Secondary | ICD-10-CM

## 2023-07-17 DIAGNOSIS — I890 Lymphedema, not elsewhere classified: Secondary | ICD-10-CM

## 2023-07-17 DIAGNOSIS — M25612 Stiffness of left shoulder, not elsewhere classified: Secondary | ICD-10-CM

## 2023-07-17 DIAGNOSIS — Z483 Aftercare following surgery for neoplasm: Secondary | ICD-10-CM

## 2023-07-17 DIAGNOSIS — G620 Drug-induced polyneuropathy: Secondary | ICD-10-CM | POA: Diagnosis not present

## 2023-07-17 DIAGNOSIS — Z171 Estrogen receptor negative status [ER-]: Secondary | ICD-10-CM

## 2023-07-17 NOTE — Therapy (Signed)
OUTPATIENT PHYSICAL THERAPY  ONCOLOGY TREATMENT  Patient Name: Gabrielle [name] MRN: [id number] DOB:05-24-1991, 32 y.o., female Today's Date: 07/17/2023  END OF SESSION:  PT End of Session - 07/17/23 1111     Visit Number 4    Number of Visits 16    Date for PT Re-Evaluation 08/28/23    PT Start Time 1102    PT Stop Time 1202    PT Time Calculation (min) 60 min    Activity Tolerance Patient tolerated treatment well    Behavior During Therapy [facility] for tasks assessed/performed             Past Medical History:  Diagnosis Date   Arthritis    Bilateral Knees   Cancer (HCC) 03/11/2022   Breast   Family history of breast cancer 03/21/2022   Family history of prostate cancer 03/21/2022   Hypercholesteremia    Juvenile arthritis (HCC)    Neuropathy    Past Surgical History:  Procedure Laterality Date   AXILLARY SENTINEL NODE BIOPSY Left 05/14/2022   Procedure: LEFT AXILLARY SENTINEL NODE BIOPSY;  Surgeon: [name], MD;  Location: [facility];  Service: General;  Laterality: Left;  GEN w/ PEC BLOCK   BREAST BIOPSY Left 03/11/2022   BREAST LUMPECTOMY WITH AXILLARY LYMPH NODE BIOPSY Left 04/16/2022   Procedure: LEFT BREAST LUMPECTOMY WITH LEFT AXILLARY SENTINEL LYMPH NODE BIOPSY;  Surgeon: [name], MD;  Location: [facility];  Service: General;  Laterality: Left;   DENTAL SURGERY     PORTACATH PLACEMENT Right 04/16/2022   Procedure: INSERTION PORT-A-CATH;  Surgeon: [name], MD;  Location: [facility];  Service: General;  Laterality: Right;   Patient Active Problem List   Diagnosis Date Noted   History of juvenile arthritis 04/10/2023   Nausea with vomiting 09/19/2022   Joint pain 09/19/2022   Encounter for antineoplastic chemotherapy 09/19/2022   Peripheral neuropathy 09/19/2022   Hypokalemia 09/19/2022   Port-A-Cath in place 06/14/2022   Genetic testing 03/29/2022   Family history of breast cancer 03/21/2022   Family  history of prostate cancer 03/21/2022   Malignant neoplasm of upper-outer quadrant of left breast in female, estrogen receptor negative (HCC) 03/15/2022     REFERRING PROVIDER: Santiago Glad, NP  REFERRING DIAG: Left breast Lymphedema, deconditioning  THERAPY DIAG:  Malignant neoplasm of upper-outer quadrant of left breast in female, estrogen receptor negative (HCC)  Aftercare following surgery for neoplasm  Lymphedema, not elsewhere classified  Muscle weakness (generalized)  Difficulty walking  Stiffness of left shoulder, not elsewhere classified  ONSET DATE: 03/06/2023  Rationale for Evaluation and Treatment: Rehabilitation  SUBJECTIVE:  SUBJECTIVE STATEMENT:  I was sore later after last session but after I rested for a little bit I was fine. I'm noticing I'm not as SOB anymore either. My Lt breast is still really tender, heavy, and sore.   Eval: At night when in the bed her legs ache a lot even with the gabapentin, but mainly at night.  Feet are very painful. She  feels stiff from the knees down. If sitting greater than 5 minutes I feel really stiff. I can't straighten up my back with standing. Breast swelling started after radiation and joint pain started with the Keytruda. Breast has shooting pain intermittently, and she notices firm spots, and enlarged pores. The breast feels heavy but it is not tender. The leg discomfort makes stairs difficult, getting up from low furniture is hard. Sometimes I need help. I fall a lot because my legs get twisted up. My legs and my feet swell up. Sometimes I walk with a cane.I use a motorized scooter in stores.   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 Attempted  to do SLNB but only fibroadipose tissue was removed. Returned to sx for removal of 4 negative LNs 05/14/22 . She had chemotherapy and immunotherapy that was not well tolerated and radiation. Presently having difficulty with neuropathy in her feet, and stiffness pain, especially knees down with trouble standing erect and numerous falls.   PAIN:  Are you having pain? No   PRECAUTIONS: Left UE lymphedema risk, JRA, CIPN, FALL RISK  RED FLAGS: None   WEIGHT BEARING RESTRICTIONS: No, but pt is a significant fall risk  FALLS:  Has patient fallen in last 6 months? Yes. Number of falls 4  LIVING ENVIRONMENT: Lives with:  her sister, her 23 yr old son and sisters friend and daughter, and cousin Lives in: House/apartment Stairs: Yes; External: 20 steps; bilateral but cannot reach both Has following equipment at home: Single point cane and None  OCCUPATION: not working  LEISURE: read, play games, crosswords  HAND DOMINANCE: right   PRIOR LEVEL OF FUNCTION: Independent with household mobility with device  PATIENT GOALS: Decrease breast swelling, Get legs stronger   OBJECTIVE:  COGNITION: Overall cognitive status: Within functional limits for tasks assessed   PALPATION: Fibrosis especially left medial breast mid to lower  OBSERVATIONS / OTHER ASSESSMENTS: hyperpigmentation, Peau d'orange, fibrosis especially left medial breast superior to inferior,thickened skin  SENSATION: Light touch: Deficits especially feet   POSTURE: significant forward head, rounded shoulders, very flexed posture  UPPER EXTREMITY AROM/PROM:  A/PROM RIGHT   eval   Shoulder extension   Shoulder flexion 135  Shoulder abduction 145  Shoulder internal rotation   Shoulder external rotation     (Blank rows = not tested)  A/PROM LEFT   eval  Shoulder extension   Shoulder flexion 105  Shoulder abduction 80  Shoulder internal rotation   Shoulder external rotation     (Blank rows = not  tested)  CERVICAL AROM: All within normal limits:     UPPER EXTREMITY STRENGTH:   LYMPHEDEMA ASSESSMENTS:   SURGERY TYPE/DATE: Left lumpectomy 04/16/2022, Triple -  NUMBER OF LYMPH NODES REMOVED: 0/4  CHEMOTHERAPY: YES and immunotherapy stopped due to low tolerance  RADIATION:YES, 01/16/23-03/06/2023  HORMONE TREATMENT: NO  INFECTIONS: NO   LYMPHEDEMA ASSESSMENTS:   LANDMARK RIGHT  eval  At axilla    15 cm proximal to olecranon process   10 cm proximal to olecranon process  Olecranon process   15 cm proximal to ulnar styloid process   10 cm proximal to ulnar styloid process   Just proximal to ulnar styloid process   Across hand at thumb web space   At base of 2nd digit   (Blank rows = not tested)  LANDMARK LEFT  eval  At axilla    15 cm proximal to olecranon process   10 cm proximal to olecranon process   Olecranon process   15 cm proximal to ulnar styloid process   10 cm proximal to ulnar styloid process   Just proximal to ulnar styloid process   Across hand at thumb web space   At base of 2nd digit   (Blank rows = not tested)   FUNCTIONAL TESTS:  Sit to stand;very slow, use of bilateral UE's, flexed posture  GAIT: Distance walked: slow and difficult rising from chair, slow, very flexed posture Assistive device utilized: no AD device today but usually uses a cane.  Uses a motorized cart in stores Level of assistance:  Needs to be SBA to min A for safety Comments:   L-DEX LYMPHEDEMA SCREENING: The patient was assessed using the L-Dex machine today to produce a lymphedema index baseline score. The patient will be reassessed on a regular basis (typically every 3 months) to obtain new L-Dex scores. If the score is > 6.5 points away from his/her baseline score indicating onset of subclinical lymphedema, it will be recommended to wear a compression garment for 4 weeks, 12 hours per day and then be reassessed. If the score continues to be > 6.5 points from  baseline at reassessment, we will initiate lymphedema treatment. Assessing in this manner has a 95% rate of preventing clinically significant lymphedema.  QUICK DASH SURVEY:  BREAST COMPLAINTS QUESTIONNAIRE Pain:6 Heaviness:5 Swollen feeling:6 Tense Skin:5 Redness:0 Bra Print:6 Size of Pores:7 Hard feeling: 7 Total:   42  /80 A Score over 9 indicates lymphedema issues in the breast   TODAY'S TREATMENT:                                                                                                                                          DATE:  07/17/23: Therapeutic Exercises NuStep Level 5 x 10 mins seat 11/UE 9, 715 steps  Leg Press 40#, 2 x 10. Pt with good technique In // bars: 2# on each ankle to for following: SLS on blue oval for bil hip 3 way raises 1 x 12, then 1 x 10 with VC's required throughout to relax shoulders and correct LE position for abd while performing Seated edge of mat for rest but also used time to stretch: bil HS stretch 2 x, 20 sec each and then piriformis figure 4 stretch 2 x 20 sec each; next stood at end of // bars for quad stretch grabbing foot behind glut Manual Therapy MLD (shortened sequence due to time) to  Lt breast at end of session: Short neck, 5 diaphragmatic breaths, Rt axillary nodes, Lt inguinal nodes, anterior inter-axillary and Lt axillo-inguinal anastomosis, then focused on Lt breast redirecting fluid towards anterior anastomosis, then into Rt S/L for further focus to lateral breast redirecting towards lateral anastomosis, then finished retracing steps back to lymph nodes beginning to instruct pt in basics of anatomy of lymphatic system.   07/15/23: Therapeutic Exercises NuStep Level 5 x 10 mins seat 11/UE 8, 485 steps  Leg Press 30#, 2 x 10 In // bars: 2# on each ankle to for following: SLS on blue oval for bil hip 3 way raises 2 x 10 with VC's required throughout for erect posture/relax shoulders and correct LE position for abd while  performing and after therapist demo Seated edge of mat for rest but also used time to stretch: bil HS stretch 2 x, 20 sec each and then piriformis figure 4 stretch 2 x 20 sec each returning therapist demo and VC's for correct technique In // bars for front heel-toe walking x4 with VC's throughout to remind pt to relax shoulders and for erect posture, then retro x 2  Wall slides x 5, pt reports fatigued at this point and ready to stop for the day. Manual Therapy MLD to Lt breast: Short neck, 5 diaphragmatic breaths,   07/10/23: Therapeutic Exercises Supine: Bridges x 10 SLR 3 x 5 bil SAQ x 10 each leg Tried bil prirformis stretch but increased bil knee pain so stopped  Butterfly stretch x 3 , 20 sec holds Seated: Alt LAQ 3 x 5 Trial of bil piriformis stretch but increased knee pain so stopped Sit to stand x 5 returning therapist demo and VC's to stand fully erect  Hip adduction with bolster squeeze x 10 Standing at counter: Mini squats 2 x 5 with demo and VC's to stand fully erect after each rep by squeezing gluts to return to standing NuStep Level 4 x 5 mins seat 11/UE 8  07/03/2023 Discussed POC: address shoulder ROM, Breast edema, LE weakness, balance. Script and information given to get compression bra, Discussed AD for safety    PATIENT EDUCATION:  Education details: Bil LE strength in varying positions and HS stretch in sitting  Person educated: Patient Education method: Explanation and Handouts, demonstration and VC's Education comprehension: verbalized understanding, returned demonstration, VC's and pt will benefit from further review  HOME EXERCISE PROGRAM: 07/10/23: Bil LE strength and flexibility, see below  ASSESSMENT:  CLINICAL IMPRESSION: Pt returns reporting some increased soreness after last session but felt better taking a nap. Progressed pt lightly today on leg press from 30#-40#, and then increased reps with # way SLR. Pt will benefit from continued physical  therapy working towards improving her bil LE strength, balance and endurance. Also she will benefit from continued education on how to safely progress her exercises after chemotherapy. Also began Lt breast MLD. Pt is very fibrotic and will benefit from further focus here now that she is doing well on her initial HEP for bil LE strength.    OBJECTIVE IMPAIRMENTS: Abnormal gait, decreased activity tolerance, decreased balance, decreased endurance, decreased knowledge of condition, decreased mobility, difficulty walking, decreased ROM, decreased strength, increased edema, impaired sensation, impaired UE functional use, postural dysfunction, and pain.   ACTIVITY LIMITATIONS: carrying, bending, standing, squatting, sleeping, bathing, reach over head, and locomotion level  PARTICIPATION LIMITATIONS: meal prep, cleaning, laundry, community activity, and occupation  PERSONAL FACTORS: 3+ comorbidities: Triple Negative Breast Cancer s/p radiation and chemotherapy, immunotherapy,  JRA,neuropathy  are also affecting patient's functional outcome.   REHAB POTENTIAL: Good  CLINICAL DECISION MAKING: Stable/uncomplicated  EVALUATION COMPLEXITY: Low  GOALS: Goals reviewed with patient? Yes  SHORT TERM GOALS: Target date: 07/31/2023  Pt will have decreased left breast pain /swelling by 25% Baseline: Goal status: INITIAL  2.  Pt will have improved left shoulder ROM flexion by atleast 15 degrees and abd by atleast 20 degrees  Baseline: 105/80 Goal status: INITIAL  3.  Pt will purchase compression bra for control of left breast Lymphedema Baseline:  Goal status: INITIAL  4.  Pt will be independent in a HEP for shoulder ROM and strength and LE strength Baseline:  Goal status: INITIAL   LONG TERM GOALS: Target date: 08/28/2023  Pt will have decreased left breast pain/swelling x 50% Baseline:  Goal status: INITIAL  2.  Pt will have Left shoulder flexion and abd increased to 120 degrees for improved  reach Baseline:  Goal status: INITIAL  3.  Pt will be able to perform sit to stand from regular height chair without use of hands Baseline:  Goal status: INITIAL  4.  Pt will ambulate short household distances with more erect gait and confidence in balance Baseline:  Goal status: INITIAL  5.  Pt will be independent in left breast MLD Baseline:  Goal status: INITIAL  6.  Pt will be able to do short periods of shopping pushing a shopping cart instead of using motorized scooter Baseline:  Goal status: INITIAL  PLAN:  PT FREQUENCY: 2x/week  PT DURATION: 8 weeks  PLANNED INTERVENTIONS: Therapeutic exercises, Therapeutic activity, Neuromuscular re-education, Balance training, Gait training, Patient/Family education, Self Care, Joint mobilization, Orthotic/Fit training, Manual lymph drainage, Manual therapy, and Re-evaluation  PLAN FOR NEXT SESSION: Cont Breast MLD and instruct in self MLD; did pt get compression bra or an appt to be measured,? initiate shoulder AAROM, cont NU step for leg strength, cont LE strength and review and progress HEP; adding bil hip 3 way raises standing at counter Interested in Flexitouch?  Hermenia Bers, PTA 07/17/2023, 2:16 PM  Bridge    Lie back, legs bent. Inhale, pressing hips up. Keeping ribs in, lengthen lower back. Exhale, rolling down along spine from top. Repeat __10__ times. Do __2__ sessions per day.  Straight Leg Raise    Tighten stomach and slowly raise locked right leg 6-10 inches from floor. Then do same with left. Repeat _5-10___ times per set. Do __2-3__ sets per session. Do __2__ sessions per day.   Short Arc [employer] a large can or rolled towel under leg. Straighten knee and leg. Hold __3-5__ seconds. Repeat with other leg. Repeat __10-20__ times. Do __2__ sessions per day.  KNEE: Extension, Long Arc Quads - Sitting    Raise leg until knee is straight. _10__ reps per set, __2-3_ sets per day.   Hip  squeeze with folded pillow between knees.  Hold 5 seconds and repeat 20 times.   Hamstring Stretch    Inhale and straighten spine. Exhale and lean forward toward extended leg. Hold position for _20-30__ seconds. Inhale and come back to center. Repeat with other leg extended. Repeat __3_ times, alternating legs. Do _2-3__ times per day.  Can also try butterfly stretch when laying down. Slide heels toward butt and let knees fall apart for inner thigh stretch.  Hold 20 seconds and repeat 5 times.   SIT TO STAND: Foam    Feet: shoulder-width. Lean chest forward and arms out  in front. Stand tall and straighten knees to stand finishing with squeezing butt together as you stand all the way up..  __5-10_ reps per set, __2-3_ sets per day.   Do Mini Squats at counter with hands lightly on counter and "start to sit down" only going as low as you can control. To return to standing squeeze butt together and stand all the way up. Repeat 10 times, 2 sets.   Cancer Rehab [phone]

## 2023-07-20 ENCOUNTER — Inpatient Hospital Stay: Admission: RE | Admit: 2023-07-20 | Payer: Medicaid Other | Source: Ambulatory Visit

## 2023-07-24 ENCOUNTER — Ambulatory Visit: Payer: Commercial Managed Care - HMO

## 2023-07-29 ENCOUNTER — Ambulatory Visit: Payer: Commercial Managed Care - HMO

## 2023-07-29 NOTE — Progress Notes (Deleted)
Office Visit Note  Patient: Gabrielle [name]             Date of Birth: 1990-12-11           MRN: [id number]             PCP: Norm Salt, PA Referring: Norm Salt, [place] Visit Date: 08/12/2023   Subjective:  No chief complaint on file.   History of Present Illness: Gabrielle [name] is a 32 y.o. female here for follow up for ongoing joint pain concern for possible inflammatory arthritis.    Previous HPI 05/12/2023 Gabrielle [name] is a 32 y.o. female here for follow up for ongoing joint pain concern for possible inflammatory arthritis.  Lab testing at initial visit did show a very mildly elevated sedimentation rate of 25.  Otherwise was unremarkable for specific antibodies.  X-ray of the knee showed some considerable calcification at the patellar ligament insertion tibial tuberosity.  Still has ongoing knee pain worse on the right side and gets worse with prolonged use especially certain movements getting up from a long duration seated getting up from the floor or climbing stairs.  Gets visible swelling in the feet and ankles usually develops by the end of the day and is improved in the morning.   Previous HPI 04/10/23 Gabrielle [name] is a 32 y.o. female here for evaluation of joint pain especially knees. She has a history of previous JRA but was apparently never on long term DMARD treatment mostly symptoms controlled with NSAIDs. Had been resolved without chronic joint inflammation and no surgery required. She was diagnosed with breast cancer last year and underwent lumpectomy and adjuvant chemotherapy regimen including paclitaxel, carboplatin, and [place] starting July last year. She had side effects with treatment including neuropathy in hands and feet started treatment with gabapentin for this. Also started to develop increased joint pain at multiple areas but particularly knees. She completed last infusion in November then completed radiation treatment in April. Many side effects and  much of her overall body aches improved with still having knee pain. Does not see much visible swelling. Morning stiffness for a few minutes also has trouble getting up from sitting, worse after prolonged time in one position. Still has some pain in hands and feet but also sometimes prickly type sensation partially improved with gabapentin.   No Rheumatology ROS completed.   PMFS History:  Patient Active Problem List   Diagnosis Date Noted   History of juvenile arthritis 04/10/2023   Nausea with vomiting 09/19/2022   Joint pain 09/19/2022   Encounter for antineoplastic chemotherapy 09/19/2022   Peripheral neuropathy 09/19/2022   Hypokalemia 09/19/2022   Port-A-Cath in place 06/14/2022   Genetic testing 03/29/2022   Family history of breast cancer 03/21/2022   Family history of prostate cancer 03/21/2022   Malignant neoplasm of upper-outer quadrant of left breast in female, estrogen receptor negative (HCC) 03/15/2022    Past Medical History:  Diagnosis Date   Arthritis    Bilateral Knees   Cancer (HCC) 03/11/2022   Breast   Family history of breast cancer 03/21/2022   Family history of prostate cancer 03/21/2022   Hypercholesteremia    Juvenile arthritis (HCC)    Neuropathy     Family History  Problem Relation Age of Onset   Breast cancer Mother 65   Prostate cancer Father 27   Diabetes Brother    Breast cancer Paternal Grandmother        dx after 47  Prostate cancer Paternal Grandfather        dx 25s; metastatic   ADD / ADHD Son    Breast cancer Maternal Aunt 71   Breast cancer Paternal Aunt        dx unknown age   Prostate cancer Paternal Uncle        dx after 47   Cystic fibrosis Niece    Past Surgical History:  Procedure Laterality Date   AXILLARY SENTINEL NODE BIOPSY Left 05/14/2022   Procedure: LEFT AXILLARY SENTINEL NODE BIOPSY;  Surgeon: [name], MD;  Location: [facility] OR;  Service: General;  Laterality: Left;  GEN w/ PEC BLOCK   BREAST BIOPSY Left  03/11/2022   BREAST LUMPECTOMY WITH AXILLARY LYMPH NODE BIOPSY Left 04/16/2022   Procedure: LEFT BREAST LUMPECTOMY WITH LEFT AXILLARY SENTINEL LYMPH NODE BIOPSY;  Surgeon: [name], MD;  Location: [facility];  Service: General;  Laterality: Left;   DENTAL SURGERY     PORTACATH PLACEMENT Right 04/16/2022   Procedure: INSERTION PORT-A-CATH;  Surgeon: [name], MD;  Location: [facility];  Service: General;  Laterality: Right;   Social History   Social History Narrative   Not on file   Immunization History  Administered Date(s) Administered   Tdap 12/19/2016     Objective: Vital Signs: There were no vitals taken for this visit.   Physical Exam   Musculoskeletal Exam: ***  CDAI Exam: CDAI Score: -- Patient Global: --; Provider Global: -- Swollen: --; Tender: -- Joint Exam 08/12/2023   No joint exam has been documented for this visit   There is currently no information documented on the homunculus. Go to the Rheumatology activity and complete the homunculus joint exam.  Investigation: No additional findings.  Imaging: No results found.  Recent Labs: Lab Results  Component Value Date   WBC 4.5 06/19/2023   HGB 12.4 06/19/2023   PLT 262 06/19/2023   NA 138 06/19/2023   K 3.7 06/19/2023   CL 105 06/19/2023   CO2 27 06/19/2023   GLUCOSE 82 06/19/2023   BUN 14 06/19/2023   CREATININE 0.84 06/19/2023   BILITOT 0.6 06/19/2023   ALKPHOS 99 06/19/2023   AST 25 06/19/2023   ALT 12 06/19/2023   PROT 6.8 06/19/2023   ALBUMIN 4.1 06/19/2023   CALCIUM 10.4 (H) 06/19/2023    Speciality Comments: No specialty comments available.  Procedures:  No procedures performed Allergies: Patient has no known allergies.   Assessment / Plan:     Visit Diagnoses: No diagnosis found.  ***  Orders: No orders of the defined types were placed in this encounter.  No orders of the defined types were placed in this  encounter.    Follow-Up Instructions: No follow-ups on file.   Metta Clines, RT  Note - This record has been created using [employer].  Chart creation errors have been sought, but may not always  have been located. Such creation errors do not reflect on  the standard of medical care.

## 2023-07-31 ENCOUNTER — Ambulatory Visit: Payer: Commercial Managed Care - HMO | Attending: Nurse Practitioner

## 2023-07-31 DIAGNOSIS — M6281 Muscle weakness (generalized): Secondary | ICD-10-CM | POA: Insufficient documentation

## 2023-07-31 DIAGNOSIS — R293 Abnormal posture: Secondary | ICD-10-CM | POA: Diagnosis present

## 2023-07-31 DIAGNOSIS — R262 Difficulty in walking, not elsewhere classified: Secondary | ICD-10-CM | POA: Diagnosis present

## 2023-07-31 DIAGNOSIS — M25612 Stiffness of left shoulder, not elsewhere classified: Secondary | ICD-10-CM | POA: Diagnosis present

## 2023-07-31 DIAGNOSIS — Z171 Estrogen receptor negative status [ER-]: Secondary | ICD-10-CM | POA: Insufficient documentation

## 2023-07-31 DIAGNOSIS — I890 Lymphedema, not elsewhere classified: Secondary | ICD-10-CM | POA: Insufficient documentation

## 2023-07-31 DIAGNOSIS — C50412 Malignant neoplasm of upper-outer quadrant of left female breast: Secondary | ICD-10-CM | POA: Insufficient documentation

## 2023-07-31 DIAGNOSIS — Z483 Aftercare following surgery for neoplasm: Secondary | ICD-10-CM | POA: Insufficient documentation

## 2023-07-31 NOTE — Therapy (Signed)
OUTPATIENT PHYSICAL THERAPY  ONCOLOGY TREATMENT  Patient Name: JAKERA MAGA MRN: [id number] DOB:1991/01/13, 32 y.o., female Today's Date: 07/31/2023  END OF SESSION:  PT End of Session - 07/31/23 1006     Visit Number 5    Number of Visits 16    Date for PT Re-Evaluation 08/28/23    PT Start Time 1002    PT Stop Time 1100    PT Time Calculation (min) 58 min    Activity Tolerance Patient tolerated treatment well    Behavior During Therapy [facility] for tasks assessed/performed             Past Medical History:  Diagnosis Date   Arthritis    Bilateral Knees   Cancer (HCC) 03/11/2022   Breast   Family history of breast cancer 03/21/2022   Family history of prostate cancer 03/21/2022   Hypercholesteremia    Juvenile arthritis (HCC)    Neuropathy    Past Surgical History:  Procedure Laterality Date   AXILLARY SENTINEL NODE BIOPSY Left 05/14/2022   Procedure: LEFT AXILLARY SENTINEL NODE BIOPSY;  Surgeon: [name], MD;  Location: [facility];  Service: General;  Laterality: Left;  GEN w/ PEC BLOCK   BREAST BIOPSY Left 03/11/2022   BREAST LUMPECTOMY WITH AXILLARY LYMPH NODE BIOPSY Left 04/16/2022   Procedure: LEFT BREAST LUMPECTOMY WITH LEFT AXILLARY SENTINEL LYMPH NODE BIOPSY;  Surgeon: [name], MD;  Location: [facility];  Service: General;  Laterality: Left;   DENTAL SURGERY     PORTACATH PLACEMENT Right 04/16/2022   Procedure: INSERTION PORT-A-CATH;  Surgeon: [name], MD;  Location: [facility];  Service: General;  Laterality: Right;   Patient Active Problem List   Diagnosis Date Noted   History of juvenile arthritis 04/10/2023   Nausea with vomiting 09/19/2022   Joint pain 09/19/2022   Encounter for antineoplastic chemotherapy 09/19/2022   Peripheral neuropathy 09/19/2022   Hypokalemia 09/19/2022   Port-A-Cath in place 06/14/2022   Genetic testing 03/29/2022   Family history of breast cancer 03/21/2022   Family  history of prostate cancer 03/21/2022   Malignant neoplasm of upper-outer quadrant of left breast in female, estrogen receptor negative (HCC) 03/15/2022     REFERRING PROVIDER: Santiago Glad, NP  REFERRING DIAG: Left breast Lymphedema, deconditioning  THERAPY DIAG:  Malignant neoplasm of upper-outer quadrant of left breast in female, estrogen receptor negative (HCC)  Aftercare following surgery for neoplasm  Lymphedema, not elsewhere classified  Muscle weakness (generalized)  Difficulty walking  Stiffness of left shoulder, not elsewhere classified  ONSET DATE: 03/06/2023  Rationale for Evaluation and Treatment: Rehabilitation  SUBJECTIVE:  SUBJECTIVE STATEMENT:  Tonight I have a Survivor walk at a church in [place] so I don't want to get too sore today. Plus I have been really sore since I was here last. Been having trouble getting up out of chairs. Some days I'm able to go a little harder, but I have mostly been fighting soreness. And my Lt breast felt really sore after you worked on it last time. Later it felt like it was coming out, I know it wasn't but that's just how it felt. I called [facility] but they wouldn't let me make an appt without the order (this was faxed the same day pt was least here) and I just forgot to call back to schedule. I will do that today.   Eval: At night when in the bed her legs ache a lot even with the gabapentin, but mainly at night.  Feet are very painful. She  feels stiff from the knees down. If sitting greater than 5 minutes I feel really stiff. I can't straighten up my back with standing. Breast swelling started after radiation and joint pain started with the Keytruda. Breast has shooting pain intermittently, and she notices firm spots, and enlarged pores. The  breast feels heavy but it is not tender. The leg discomfort makes stairs difficult, getting up from low furniture is hard. Sometimes I need help. I fall a lot because my legs get twisted up. My legs and my feet swell up. Sometimes I walk with a cane.I use a motorized scooter in stores.   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 Attempted to do SLNB but only fibroadipose tissue was removed. Returned to sx for removal of 4 negative LNs 05/14/22 . She had chemotherapy and immunotherapy that was not well tolerated and radiation. Presently having difficulty with neuropathy in her feet, and stiffness pain, especially knees down with trouble standing erect and numerous falls.   PAIN:  Are you having pain? No   PRECAUTIONS: Left UE lymphedema risk, JRA, CIPN, FALL RISK  RED FLAGS: None   WEIGHT BEARING RESTRICTIONS: No, but pt is a significant fall risk  FALLS:  Has patient fallen in last 6 months? Yes. Number of falls 4  LIVING ENVIRONMENT: Lives with:  her sister, her 25 yr old son and sisters friend and daughter, and cousin Lives in: House/apartment Stairs: Yes; External: 20 steps; bilateral but cannot reach both Has following equipment at home: Single point cane and None  OCCUPATION: not working  LEISURE: read, play games, crosswords  HAND DOMINANCE: right   PRIOR LEVEL OF FUNCTION: Independent with household mobility with device  PATIENT GOALS: Decrease breast swelling, Get legs stronger   OBJECTIVE:  COGNITION: Overall cognitive status: Within functional limits for tasks assessed   PALPATION: Fibrosis especially left medial breast mid to lower  OBSERVATIONS / OTHER ASSESSMENTS: hyperpigmentation, Peau d'orange, fibrosis especially left medial breast superior to inferior,thickened skin  SENSATION: Light touch: Deficits especially feet   POSTURE: significant forward head,  rounded shoulders, very flexed posture  UPPER EXTREMITY AROM/PROM:  A/PROM RIGHT   eval   Shoulder extension   Shoulder flexion 135  Shoulder abduction 145  Shoulder internal rotation   Shoulder external rotation     (Blank rows = not tested)  A/PROM LEFT   eval  Shoulder extension   Shoulder flexion 105  Shoulder abduction 80  Shoulder internal rotation   Shoulder external rotation     (  Blank rows = not tested)  CERVICAL AROM: All within normal limits:     UPPER EXTREMITY STRENGTH:   LYMPHEDEMA ASSESSMENTS:   SURGERY TYPE/DATE: Left lumpectomy 04/16/2022, Triple -  NUMBER OF LYMPH NODES REMOVED: 0/4  CHEMOTHERAPY: YES and immunotherapy stopped due to low tolerance  RADIATION:YES, 01/16/23-03/06/2023  HORMONE TREATMENT: NO  INFECTIONS: NO   LYMPHEDEMA ASSESSMENTS:   LANDMARK RIGHT  eval  At axilla    15 cm proximal to olecranon process   10 cm proximal to olecranon process   Olecranon process   15 cm proximal to ulnar styloid process   10 cm proximal to ulnar styloid process   Just proximal to ulnar styloid process   Across hand at thumb web space   At base of 2nd digit   (Blank rows = not tested)  LANDMARK LEFT  eval  At axilla    15 cm proximal to olecranon process   10 cm proximal to olecranon process   Olecranon process   15 cm proximal to ulnar styloid process   10 cm proximal to ulnar styloid process   Just proximal to ulnar styloid process   Across hand at thumb web space   At base of 2nd digit   (Blank rows = not tested)   FUNCTIONAL TESTS:  Sit to stand;very slow, use of bilateral UE's, flexed posture  GAIT: Distance walked: slow and difficult rising from chair, slow, very flexed posture Assistive device utilized: no AD device today but usually uses a cane.  Uses a motorized cart in stores Level of assistance:  Needs to be SBA to min A for safety Comments:   L-DEX LYMPHEDEMA SCREENING: The patient was assessed using the L-Dex  machine today to produce a lymphedema index baseline score. The patient will be reassessed on a regular basis (typically every 3 months) to obtain new L-Dex scores. If the score is > 6.5 points away from his/her baseline score indicating onset of subclinical lymphedema, it will be recommended to wear a compression garment for 4 weeks, 12 hours per day and then be reassessed. If the score continues to be > 6.5 points from baseline at reassessment, we will initiate lymphedema treatment. Assessing in this manner has a 95% rate of preventing clinically significant lymphedema.  QUICK DASH SURVEY:  BREAST COMPLAINTS QUESTIONNAIRE Pain:6 Heaviness:5 Swollen feeling:6 Tense Skin:5 Redness:0 Bra Print:6 Size of Pores:7 Hard feeling: 7 Total:   42  /80 A Score over 9 indicates lymphedema issues in the breast   TODAY'S TREATMENT:                                                                                                                                          DATE:  07/31/23: Therapeutic Exercises NuStep Level 5 x 10 mins seat 11/UE 9, 715 steps  Seated EOB for bil HS stretch and then bil figure 4 piriformis stretch 2x, 20  sec holds each returning therapist demo.  Manual Therapy MLD to Lt breast: Short neck, superficial and deep abdominals, Rt axillary nodes and intact anterior thorax sequence, Lt inguinal nodes, anterior inter-axillary and Lt axillo-inguinal anastomosis, then focused on Lt breast redirecting fluid towards anterior anastomosis, then into Rt S/L for further focus to lateral breast redirecting towards lateral anastomosis, then finished retracing steps back to lymph nodes beginning to instruct pt in self MLD having her return demo briefly at end of session. Cuing for skin stretch, not slide   07/17/23: Therapeutic Exercises NuStep Level 5 x 10 mins seat 11/UE 9, 715 steps  Leg Press 40#, 2 x 10. Pt with good technique In // bars: 2# on each ankle to for following: SLS on blue oval  for bil hip 3 way raises 1 x 12, then 1 x 10 with VC's required throughout to relax shoulders and correct LE position for abd while performing Seated edge of mat for rest but also used time to stretch: bil HS stretch 2 x, 20 sec each and then piriformis figure 4 stretch 2 x 20 sec each; next stood at end of // bars for quad stretch grabbing foot behind glut Manual Therapy MLD (shortened sequence due to time) to Lt breast at end of session: Short neck, 5 diaphragmatic breaths, Rt axillary nodes, Lt inguinal nodes, anterior inter-axillary and Lt axillo-inguinal anastomosis, then focused on Lt breast redirecting fluid towards anterior anastomosis, then into Rt S/L for further focus to lateral breast redirecting towards lateral anastomosis, then finished retracing steps back to lymph nodes beginning to instruct pt in basics of anatomy of lymphatic system.   07/15/23: Therapeutic Exercises NuStep Level 5 x 10 mins seat 11/UE 8, 485 steps  Leg Press 30#, 2 x 10 In // bars: 2# on each ankle to for following: SLS on blue oval for bil hip 3 way raises 2 x 10 with VC's required throughout for erect posture/relax shoulders and correct LE position for abd while performing and after therapist demo Seated edge of mat for rest but also used time to stretch: bil HS stretch 2 x, 20 sec each and then piriformis figure 4 stretch 2 x 20 sec each returning therapist demo and VC's for correct technique In // bars for front heel-toe walking x4 with VC's throughout to remind pt to relax shoulders and for erect posture, then retro x 2  Wall slides x 5, pt reports fatigued at this point and ready to stop for the day. Manual Therapy MLD to Lt breast: Short neck, 5 diaphragmatic breaths,      PATIENT EDUCATION:  Education details: Bil LE strength in varying positions and HS stretch in sitting  Person educated: Patient Education method: Explanation and Handouts, demonstration and VC's Education comprehension: verbalized  understanding, returned demonstration, VC's and pt will benefit from further review  HOME EXERCISE PROGRAM: 07/10/23: Bil LE strength and flexibility, see below  ASSESSMENT:  CLINICAL IMPRESSION: Today pt reports some increased soreness since last session. She has a Survivor walk at a [employer] so did not focus on as much exercise today so pt would not have increased soreness for tonights walk. Did resume Lt breast MLD spending time on intact sequence and began instructing pt having her return demo at end of session across ant inter-axillary anastomosis. She did well with pressure but required cuing to stretch skin, not slide. Also encouraged her to remember to call [facility] for a compression bra and she verbalized will call  today. Pt reports her breast feeling softer and better after session today. She has a very lymphedematous breast with enlarged pores and fibrosis. There was some good softening noted of the fibrosis by end of session at anterior/superior breast and at inferior aspect as well.    OBJECTIVE IMPAIRMENTS: Abnormal gait, decreased activity tolerance, decreased balance, decreased endurance, decreased knowledge of condition, decreased mobility, difficulty walking, decreased ROM, decreased strength, increased edema, impaired sensation, impaired UE functional use, postural dysfunction, and pain.   ACTIVITY LIMITATIONS: carrying, bending, standing, squatting, sleeping, bathing, reach over head, and locomotion level  PARTICIPATION LIMITATIONS: meal prep, cleaning, laundry, community activity, and occupation  PERSONAL FACTORS: 3+ comorbidities: Triple Negative Breast Cancer s/p radiation and chemotherapy, immunotherapy, JRA,neuropathy  are also affecting patient's functional outcome.   REHAB POTENTIAL: Good  CLINICAL DECISION MAKING: Stable/uncomplicated  EVALUATION COMPLEXITY: Low  GOALS: Goals reviewed with patient? Yes  SHORT TERM GOALS: Target date:  07/31/2023  Pt will have decreased left breast pain /swelling by 25% Baseline: Goal status: INITIAL  2.  Pt will have improved left shoulder ROM flexion by atleast 15 degrees and abd by atleast 20 degrees  Baseline: 105/80 Goal status: INITIAL  3.  Pt will purchase compression bra for control of left breast Lymphedema Baseline:  Goal status: INITIAL  4.  Pt will be independent in a HEP for shoulder ROM and strength and LE strength Baseline:  Goal status: INITIAL   LONG TERM GOALS: Target date: 08/28/2023  Pt will have decreased left breast pain/swelling x 50% Baseline:  Goal status: INITIAL  2.  Pt will have Left shoulder flexion and abd increased to 120 degrees for improved reach Baseline:  Goal status: INITIAL  3.  Pt will be able to perform sit to stand from regular height chair without use of hands Baseline:  Goal status: INITIAL  4.  Pt will ambulate short household distances with more erect gait and confidence in balance Baseline:  Goal status: INITIAL  5.  Pt will be independent in left breast MLD Baseline:  Goal status: INITIAL  6.  Pt will be able to do short periods of shopping pushing a shopping cart instead of using motorized scooter Baseline:  Goal status: INITIAL  PLAN:  PT FREQUENCY: 2x/week  PT DURATION: 8 weeks  PLANNED INTERVENTIONS: Therapeutic exercises, Therapeutic activity, Neuromuscular re-education, Balance training, Gait training, Patient/Family education, Self Care, Joint mobilization, Orthotic/Fit training, Manual lymph drainage, Manual therapy, and Re-evaluation  PLAN FOR NEXT SESSION: How did she do with Cancer Survivor walk? Get compression bra or appt to be measured? Cont Breast MLD and review in self MLD; did pt get compression bra or an appt to be measured,? initiate shoulder AAROM, cont NU step for leg strength, cont LE strength and review and progress HEP; adding bil hip 3 way raises standing at counter Interested in  Flexitouch?  Hermenia Bers, PTA 07/31/2023, 11:38 AM  Self manual lymph drainage:     Cancer Re[facility] [phone] Perform this sequence once a day.  Only give enough pressure to your skin to make the skin move.  Hug yourself.  Do circles at your neck just above your collarbones.  Repeat this 10 times.  Diaphragmatic - Supine   Inhale through nose making navel move out toward hands. Exhale through puckered lips, hands follow navel in. Repeat _5__ times. Rest _10__ seconds between repeats.   Copyright  VHI. All rights reserved.    Axilla - One at a Time   Using full weight  of flat hand and fingers at center of uninvolved armpit, make _10__ in-place circles.   Copyright  VHI. All rights reserved.  LEG: Inguinal Nodes Stimulation   With small finger side of hand against hip crease on involved side, gently perform circles at the crease. Repeat __10_ times.   Copyright  VHI. All rights reserved.  Axilla to Inguinal Nodes - Sweep   On involved side, sweep _4__ times from armpit along side of trunk to hip crease.  Now gently stretch skin from the involved side to the uninvolved side across the chest at the shoulder line.  Repeat that 4 times.  Draw an imaginary diagonal line from upper outer breast through the nipple area toward lower inner breast.  Direct fluid upward and inward from this line toward the pathway across your upper chest .  Do this in three rows to treat all of the upper inner breast tissue, and do each row 3-4x.      Direct fluid to treat all of lower outer breast tissue downward and outward toward pathway that is aimed at the left groin. Do this laying on your right side.  Finish by doing the pathways as described above going from your involved armpit to the same side groin and going across your upper chest from the involved shoulder to the uninvolved shoulder.  Repeat the steps above where you do circles in your left groin and right armpit.

## 2023-08-05 ENCOUNTER — Ambulatory Visit: Payer: Commercial Managed Care - HMO | Admitting: Rehabilitation

## 2023-08-05 ENCOUNTER — Encounter: Payer: Self-pay | Admitting: Rehabilitation

## 2023-08-05 DIAGNOSIS — I890 Lymphedema, not elsewhere classified: Secondary | ICD-10-CM

## 2023-08-05 DIAGNOSIS — R262 Difficulty in walking, not elsewhere classified: Secondary | ICD-10-CM

## 2023-08-05 DIAGNOSIS — Z483 Aftercare following surgery for neoplasm: Secondary | ICD-10-CM

## 2023-08-05 DIAGNOSIS — C50412 Malignant neoplasm of upper-outer quadrant of left female breast: Secondary | ICD-10-CM | POA: Diagnosis not present

## 2023-08-05 DIAGNOSIS — M25612 Stiffness of left shoulder, not elsewhere classified: Secondary | ICD-10-CM

## 2023-08-05 DIAGNOSIS — Z171 Estrogen receptor negative status [ER-]: Secondary | ICD-10-CM

## 2023-08-05 DIAGNOSIS — M6281 Muscle weakness (generalized): Secondary | ICD-10-CM

## 2023-08-05 DIAGNOSIS — R293 Abnormal posture: Secondary | ICD-10-CM

## 2023-08-05 NOTE — Therapy (Signed)
OUTPATIENT PHYSICAL THERAPY  ONCOLOGY TREATMENT  Patient Name: CESLIE STARCK MRN: [id number] DOB:[birth year], 32 y.o., female Today's Date: 08/05/2023  END OF SESSION:  PT End of Session - 08/05/23 1052     Visit Number 6    Number of Visits 16    Date for PT Re-Evaluation 08/28/23    PT Start Time 1100    PT Stop Time 1145   leaving for bras   PT Time Calculation (min) 45 min    Activity Tolerance Patient tolerated treatment well    Behavior During Therapy [facility] for tasks assessed/performed             Past Medical History:  Diagnosis Date   Arthritis    Bilateral Knees   Cancer (HCC) 03/11/2022   Breast   Family history of breast cancer 03/21/2022   Family history of prostate cancer 03/21/2022   Hypercholesteremia    Juvenile arthritis (HCC)    Neuropathy    Past Surgical History:  Procedure Laterality Date   AXILLARY SENTINEL NODE BIOPSY Left 05/14/2022   Procedure: LEFT AXILLARY SENTINEL NODE BIOPSY;  Surgeon: [name], MD;  Location: [facility] OR;  Service: General;  Laterality: Left;  GEN w/ PEC BLOCK   BREAST BIOPSY Left 03/11/2022   BREAST LUMPECTOMY WITH AXILLARY LYMPH NODE BIOPSY Left 04/16/2022   Procedure: LEFT BREAST LUMPECTOMY WITH LEFT AXILLARY SENTINEL LYMPH NODE BIOPSY;  Surgeon: [name], MD;  Location: [facility];  Service: General;  Laterality: Left;   DENTAL SURGERY     PORTACATH PLACEMENT Right 04/16/2022   Procedure: INSERTION PORT-A-CATH;  Surgeon: [name], MD;  Location: [facility];  Service: General;  Laterality: Right;   Patient Active Problem List   Diagnosis Date Noted   History of juvenile arthritis 04/10/2023   Nausea with vomiting 09/19/2022   Joint pain 09/19/2022   Encounter for antineoplastic chemotherapy 09/19/2022   Peripheral neuropathy 09/19/2022   Hypokalemia 09/19/2022   Port-A-Cath in place 06/14/2022   Genetic testing 03/29/2022   Family history of breast cancer  03/21/2022   Family history of prostate cancer 03/21/2022   Malignant neoplasm of upper-outer quadrant of left breast in female, estrogen receptor negative (HCC) 03/15/2022     REFERRING PROVIDER: Santiago Glad, NP  REFERRING DIAG: Left breast Lymphedema, deconditioning  THERAPY DIAG:  Malignant neoplasm of upper-outer quadrant of left breast in female, estrogen receptor negative (HCC)  Aftercare following surgery for neoplasm  Lymphedema, not elsewhere classified  Muscle weakness (generalized)  Difficulty walking  Abnormal posture  Stiffness of left shoulder, not elsewhere classified  ONSET DATE: 03/06/2023  Rationale for Evaluation and Treatment: Rehabilitation  SUBJECTIVE:  SUBJECTIVE STATEMENT:  My walk did well.  [place]  I have an appointment today at 12 for a bra   Eval: At night when in the bed her legs ache a lot even with the gabapentin, but mainly at night.  Feet are very painful. She  feels stiff from the knees down. If sitting greater than 5 minutes I feel really stiff. I can't straighten up my back with standing. Breast swelling started after radiation and joint pain started with the Keytruda. Breast has shooting pain intermittently, and she notices firm spots, and enlarged pores. The breast feels heavy but it is not tender. The leg discomfort makes stairs difficult, getting up from low furniture is hard. Sometimes I need help. I fall a lot because my legs get twisted up. My legs and my feet swell up. Sometimes I walk with a cane.I use a motorized scooter in stores.   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 Attempted to do SLNB but only fibroadipose tissue was  removed. Returned to sx for removal of 4 negative LNs 05/14/22 . She had chemotherapy and immunotherapy that was not well tolerated and radiation. Presently having difficulty with neuropathy in her feet, and stiffness pain, especially knees down with trouble standing erect and numerous falls.  PAIN:  Are you having pain? No  PRECAUTIONS: Left UE lymphedema risk, JRA, CIPN, FALL RISK  RED FLAGS: None   WEIGHT BEARING RESTRICTIONS: No, but pt is a significant fall risk  FALLS:  Has patient fallen in last 6 months? Yes. Number of falls 4  LIVING ENVIRONMENT: Lives with:  her sister, her 44 yr old son and sisters friend and daughter, and cousin Lives in: House/apartment Stairs: Yes; External: 20 steps; bilateral but cannot reach both Has following equipment at home: Single point cane and None  OCCUPATION: not working  LEISURE: read, play games, crosswords  HAND DOMINANCE: right   PRIOR LEVEL OF FUNCTION: Independent with household mobility with device  PATIENT GOALS: Decrease breast swelling, Get legs stronger   OBJECTIVE:  COGNITION: Overall cognitive status: Within functional limits for tasks assessed   PALPATION: Fibrosis especially left medial breast mid to lower  OBSERVATIONS / OTHER ASSESSMENTS: hyperpigmentation, Peau d'orange, fibrosis especially left medial breast superior to inferior,thickened skin  SENSATION: Light touch: Deficits especially feet   POSTURE: significant forward head, rounded shoulders, very flexed posture  UPPER EXTREMITY AROM/PROM:  A/PROM RIGHT   eval   Shoulder extension   Shoulder flexion 135  Shoulder abduction 145  Shoulder internal rotation   Shoulder external rotation     (Blank rows = not tested)  A/PROM LEFT   eval  Shoulder extension   Shoulder flexion 105  Shoulder abduction 80  Shoulder internal rotation   Shoulder external rotation     (Blank rows = not tested)  UPPER EXTREMITY STRENGTH:  LYMPHEDEMA ASSESSMENTS:   SURGERY TYPE/DATE: Left lumpectomy 04/16/2022, Triple - NUMBER OF LYMPH NODES REMOVED: 0/4 CHEMOTHERAPY: YES and immunotherapy stopped due to low tolerance RADIATION:YES, 01/16/23-03/06/2023 HORMONE TREATMENT: NO INFECTIONS: NO  LYMPHEDEMA ASSESSMENTS:   LANDMARK RIGHT  eval  At axilla    15 cm proximal to olecranon process   10 cm proximal to olecranon process   Olecranon process   15 cm proximal to ulnar styloid process   10 cm proximal to ulnar styloid process   Just proximal to ulnar styloid process   Across  hand at thumb web space   At base of 2nd digit   (Blank rows = not tested)  LANDMARK LEFT  eval  At axilla    15 cm proximal to olecranon process   10 cm proximal to olecranon process   Olecranon process   15 cm proximal to ulnar styloid process   10 cm proximal to ulnar styloid process   Just proximal to ulnar styloid process   Across hand at thumb web space   At base of 2nd digit   (Blank rows = not tested)   FUNCTIONAL TESTS:  Sit to stand;very slow, use of bilateral UE's, flexed posture  GAIT: Distance walked: slow and difficult rising from chair, slow, very flexed posture Assistive device utilized: no AD device today but usually uses a cane.  Uses a motorized cart in stores Level of assistance:  Needs to be SBA to min A for safety Comments:   L-DEX LYMPHEDEMA SCREENING: The patient was assessed using the L-Dex machine today to produce a lymphedema index baseline score. The patient will be reassessed on a regular basis (typically every 3 months) to obtain new L-Dex scores. If the score is > 6.5 points away from his/her baseline score indicating onset of subclinical lymphedema, it will be recommended to wear a compression garment for 4 weeks, 12 hours per day and then be reassessed. If the score continues to be > 6.5 points from baseline at reassessment, we will initiate lymphedema treatment. Assessing in this manner has a 95% rate of preventing clinically  significant lymphedema.  QUICK DASH SURVEY:  BREAST COMPLAINTS QUESTIONNAIRE Pain:6 Heaviness:5 Swollen feeling:6 Tense Skin:5 Redness:0 Bra Print:6 Size of Pores:7 Hard feeling: 7 Total:   42  /80 A Score over 9 indicates lymphedema issues in the breast   TODAY'S TREATMENT:                                                                                                                                          DATE:  08/05/23 Manual Therapy Focused on self MLD for Lt breast: Short neck, 5 diaphragmatic breaths, Rt axillary nodes, Lt inguinal nodes, anterior inter-axillary and Lt axillo-inguinal anastomosis, then focused on Lt breast redirecting fluid towards anterior anastomosis. Needing cueing to work on pathway direction, stretch vs sliding, and breast anatomy and direction but overall did well. Gave note about swell spot for appt with [facility] today.   Therapeutic Exercises NuStep Level 5 x 10 mins seat 11/UE 9, 715 steps Seated on red wobble cushion TrA and postural focus with slow march x 10 bil, LAQ x 10 bil Sit to stand with 5# bell from 2 cushions with vcs for nose over toes and to bring feet underneath her a bit more In // bars: 2# on each ankle to for following: SLS on blue oval for bil hip 3 way raises 2 x 10   07/31/23:  Therapeutic Exercises NuStep Level 5 x 10 mins seat 11/UE 9, 715 steps  Seated EOB for bil HS stretch and then bil figure 4 piriformis stretch 2x, 20 sec holds each returning therapist demo.  Manual Therapy MLD to Lt breast: Short neck, superficial and deep abdominals, Rt axillary nodes and intact anterior thorax sequence, Lt inguinal nodes, anterior inter-axillary and Lt axillo-inguinal anastomosis, then focused on Lt breast redirecting fluid towards anterior anastomosis, then into Rt S/L for further focus to lateral breast redirecting towards lateral anastomosis, then finished retracing steps back to lymph nodes beginning to instruct pt in self MLD  having her return demo briefly at end of session. Cuing for skin stretch, not slide   07/17/23: Therapeutic Exercises NuStep Level 5 x 10 mins seat 11/UE 9, 715 steps  Leg Press 40#, 2 x 10. Pt with good technique In // bars: 2# on each ankle to for following: SLS on blue oval for bil hip 3 way raises 1 x 12, then 1 x 10 with VC's required throughout to relax shoulders and correct LE position for abd while performing Seated edge of mat for rest but also used time to stretch: bil HS stretch 2 x, 20 sec each and then piriformis figure 4 stretch 2 x 20 sec each; next stood at end of // bars for quad stretch grabbing foot behind glut Manual Therapy MLD (shortened sequence due to time) to Lt breast at end of session: Short neck, 5 diaphragmatic breaths, Rt axillary nodes, Lt inguinal nodes, anterior inter-axillary and Lt axillo-inguinal anastomosis, then focused on Lt breast redirecting fluid towards anterior anastomosis, then into Rt S/L for further focus to lateral breast redirecting towards lateral anastomosis, then finished retracing steps back to lymph nodes beginning to instruct pt in basics of anatomy of lymphatic system.   07/15/23: Therapeutic Exercises NuStep Level 5 x 10 mins seat 11/UE 8, 485 steps  Leg Press 30#, 2 x 10 In // bars: 2# on each ankle to for following: SLS on blue oval for bil hip 3 way raises 2 x 10 with VC's required throughout for erect posture/relax shoulders and correct LE position for abd while performing and after therapist demo Seated edge of mat for rest but also used time to stretch: bil HS stretch 2 x, 20 sec each and then piriformis figure 4 stretch 2 x 20 sec each returning therapist demo and VC's for correct technique In // bars for front heel-toe walking x4 with VC's throughout to remind pt to relax shoulders and for erect posture, then retro x 2  Wall slides x 5, pt reports fatigued at this point and ready to stop for the day. Manual Therapy MLD to Lt breast:  Short neck, 5 diaphragmatic breaths,    PATIENT EDUCATION:  Education details: Bil LE strength in varying positions and HS stretch in sitting  Person educated: Patient Education method: Explanation and Handouts, demonstration and VC's Education comprehension: verbalized understanding, returned demonstration, VC's and pt will benefit from further review  HOME EXERCISE PROGRAM: 07/10/23: Bil LE strength and flexibility, see below  ASSESSMENT:  CLINICAL IMPRESSION: Reviewed self MLD for the breast which pt needed moderate cueing for completion.  Cueing mostly for sequence and direction.  Then back to exercises which was tolerated well.  Added some more core TE today.   OBJECTIVE IMPAIRMENTS: Abnormal gait, decreased activity tolerance, decreased balance, decreased endurance, decreased knowledge of condition, decreased mobility, difficulty walking, decreased ROM, decreased strength, increased edema, impaired sensation, impaired UE functional use,  postural dysfunction, and pain.   ACTIVITY LIMITATIONS: carrying, bending, standing, squatting, sleeping, bathing, reach over head, and locomotion level  PARTICIPATION LIMITATIONS: meal prep, cleaning, laundry, community activity, and occupation  PERSONAL FACTORS: 3+ comorbidities: Triple Negative Breast Cancer s/p radiation and chemotherapy, immunotherapy, JRA,neuropathy  are also affecting patient's functional outcome.   REHAB POTENTIAL: Good  CLINICAL DECISION MAKING: Stable/uncomplicated  EVALUATION COMPLEXITY: Low  GOALS: Goals reviewed with patient? Yes  SHORT TERM GOALS: Target date: 07/31/2023  Pt will have decreased left breast pain /swelling by 25% Baseline: Goal status: INITIAL  2.  Pt will have improved left shoulder ROM flexion by atleast 15 degrees and abd by atleast 20 degrees  Baseline: 105/80 Goal status: INITIAL  3.  Pt will purchase compression bra for control of left breast Lymphedema Baseline:  Goal status:  INITIAL  4.  Pt will be independent in a HEP for shoulder ROM and strength and LE strength Baseline:  Goal status: INITIAL   LONG TERM GOALS: Target date: 08/28/2023  Pt will have decreased left breast pain/swelling x 50% Baseline:  Goal status: INITIAL  2.  Pt will have Left shoulder flexion and abd increased to 120 degrees for improved reach Baseline:  Goal status: INITIAL  3.  Pt will be able to perform sit to stand from regular height chair without use of hands Baseline:  Goal status: INITIAL  4.  Pt will ambulate short household distances with more erect gait and confidence in balance Baseline:  Goal status: INITIAL  5.  Pt will be independent in left breast MLD Baseline:  Goal status: INITIAL  6.  Pt will be able to do short periods of shopping pushing a shopping cart instead of using motorized scooter Baseline:  Goal status: INITIAL  PLAN:  PT FREQUENCY: 2x/week  PT DURATION: 8 weeks  PLANNED INTERVENTIONS: Therapeutic exercises, Therapeutic activity, Neuromuscular re-education, Balance training, Gait training, Patient/Family education, Self Care, Joint mobilization, Orthotic/Fit training, Manual lymph drainage, Manual therapy, and Re-evaluation  PLAN FOR NEXT SESSION: cont shoulder AAROM, cont NU step for leg strength, cont LE strength and review and progress HEP; adding bil hip 3 way raises standing at counter Interested in Flexitouch?  Idamae Lusher, PT 08/05/2023, 11:45 AM  Self manual lymph drainage:     Cancer Rehab [phone] Perform this sequence once a day.  Only give enough pressure to your skin to make the skin move.  Hug yourself.  Do circles at your neck just above your collarbones.  Repeat this 10 times.  Diaphragmatic - Supine   Inhale through nose making navel move out toward hands. Exhale through puckered lips, hands follow navel in. Repeat _5__ times. Rest _10__ seconds between repeats.   Copyright  VHI. All rights reserved.     Axilla - One at a Time   Using full weight of flat hand and fingers at center of uninvolved armpit, make _10__ in-place circles.   Copyright  VHI. All rights reserved.  LEG: Inguinal Nodes Stimulation   With small finger side of hand against hip crease on involved side, gently perform circles at the crease. Repeat __10_ times.   Copyright  VHI. All rights reserved.  Axilla to Inguinal Nodes - Sweep   On involved side, sweep _4__ times from armpit along side of trunk to hip crease.  Now gently stretch skin from the involved side to the uninvolved side across the chest at the shoulder line.  Repeat that 4 times.  Draw an imaginary diagonal line from  upper outer breast through the nipple area toward lower inner breast.  Direct fluid upward and inward from this line toward the pathway across your upper chest .  Do this in three rows to treat all of the upper inner breast tissue, and do each row 3-4x.      Direct fluid to treat all of lower outer breast tissue downward and outward toward pathway that is aimed at the left groin. Do this laying on your right side.  Finish by doing the pathways as described above going from your involved armpit to the same side groin and going across your upper chest from the involved shoulder to the uninvolved shoulder.  Repeat the steps above where you do circles in your left groin and right armpit.

## 2023-08-12 ENCOUNTER — Ambulatory Visit: Payer: Medicaid Other | Admitting: Internal Medicine

## 2023-08-12 DIAGNOSIS — G6289 Other specified polyneuropathies: Secondary | ICD-10-CM

## 2023-08-12 DIAGNOSIS — Z79899 Other long term (current) drug therapy: Secondary | ICD-10-CM

## 2023-08-12 DIAGNOSIS — G8929 Other chronic pain: Secondary | ICD-10-CM

## 2023-08-12 DIAGNOSIS — Z8739 Personal history of other diseases of the musculoskeletal system and connective tissue: Secondary | ICD-10-CM

## 2023-08-14 ENCOUNTER — Ambulatory Visit: Payer: Commercial Managed Care - HMO

## 2023-08-14 DIAGNOSIS — Z171 Estrogen receptor negative status [ER-]: Secondary | ICD-10-CM

## 2023-08-14 DIAGNOSIS — Z483 Aftercare following surgery for neoplasm: Secondary | ICD-10-CM

## 2023-08-14 DIAGNOSIS — C50412 Malignant neoplasm of upper-outer quadrant of left female breast: Secondary | ICD-10-CM | POA: Diagnosis not present

## 2023-08-14 DIAGNOSIS — R262 Difficulty in walking, not elsewhere classified: Secondary | ICD-10-CM

## 2023-08-14 DIAGNOSIS — M6281 Muscle weakness (generalized): Secondary | ICD-10-CM

## 2023-08-14 DIAGNOSIS — I890 Lymphedema, not elsewhere classified: Secondary | ICD-10-CM

## 2023-08-14 NOTE — Therapy (Signed)
OUTPATIENT PHYSICAL THERAPY  ONCOLOGY TREATMENT  Patient Name: Gabrielle [name] MRN: [id number] DOB:04/28/1991, 32 y.o., female Today's Date: 08/14/2023  END OF SESSION:  PT End of Session - 08/14/23 1059     Visit Number 7    Number of Visits 16    Date for PT Re-Evaluation 08/28/23    Authorization Type Medicaid    Authorization Time Period 07/03/23-08/31/23    Authorization - Visit Number 6    Authorization - Number of Visits 7    PT Start Time 1101    PT Stop Time 1153    PT Time Calculation (min) 52 min    Activity Tolerance Patient tolerated treatment well    Behavior During Therapy [facility] for tasks assessed/performed             Past Medical History:  Diagnosis Date   Arthritis    Bilateral Knees   Cancer (HCC) 03/11/2022   Breast   Family history of breast cancer 03/21/2022   Family history of prostate cancer 03/21/2022   Hypercholesteremia    Juvenile arthritis (HCC)    Neuropathy    Past Surgical History:  Procedure Laterality Date   AXILLARY SENTINEL NODE BIOPSY Left 05/14/2022   Procedure: LEFT AXILLARY SENTINEL NODE BIOPSY;  Surgeon: [name], MD;  Location: [facility] OR;  Service: General;  Laterality: Left;  GEN w/ PEC BLOCK   BREAST BIOPSY Left 03/11/2022   BREAST LUMPECTOMY WITH AXILLARY LYMPH NODE BIOPSY Left 04/16/2022   Procedure: LEFT BREAST LUMPECTOMY WITH LEFT AXILLARY SENTINEL LYMPH NODE BIOPSY;  Surgeon: [name], MD;  Location: [facility];  Service: General;  Laterality: Left;   DENTAL SURGERY     PORTACATH PLACEMENT Right 04/16/2022   Procedure: INSERTION PORT-A-CATH;  Surgeon: [name], MD;  Location: [facility];  Service: General;  Laterality: Right;   Patient Active Problem List   Diagnosis Date Noted   History of juvenile arthritis 04/10/2023   Nausea with vomiting 09/19/2022   Joint pain 09/19/2022   Encounter for antineoplastic chemotherapy 09/19/2022   Peripheral neuropathy  09/19/2022   Hypokalemia 09/19/2022   Port-A-Cath in place 06/14/2022   Genetic testing 03/29/2022   Family history of breast cancer 03/21/2022   Family history of prostate cancer 03/21/2022   Malignant neoplasm of upper-outer quadrant of left breast in female, estrogen receptor negative (HCC) 03/15/2022     REFERRING PROVIDER: Santiago Glad, NP  REFERRING DIAG: Left breast Lymphedema, deconditioning  THERAPY DIAG:  Malignant neoplasm of upper-outer quadrant of left breast in female, estrogen receptor negative (HCC)  Aftercare following surgery for neoplasm  Lymphedema, not elsewhere classified  Muscle weakness (generalized)  Difficulty walking  ONSET DATE: 03/06/2023  Rationale for Evaluation and Treatment: Rehabilitation  SUBJECTIVE:  SUBJECTIVE STATEMENT:  I went to [facility] and was able to get a bra. It feels good. I think it is doing something. My breast doesn't hurt as much. I haven't done my exercises much this week because my son's school has been so hectic.  Eval: At night when in the bed her legs ache a lot even with the gabapentin, but mainly at night.  Feet are very painful. She  feels stiff from the knees down. If sitting greater than 5 minutes I feel really stiff. I can't straighten up my back with standing. Breast swelling started after radiation and joint pain started with the Keytruda. Breast has shooting pain intermittently, and she notices firm spots, and enlarged pores. The breast feels heavy but it is not tender. The leg discomfort makes stairs difficult, getting up from low furniture is hard. Sometimes I need help. I fall a lot because my legs get twisted up. My legs and my feet swell up. Sometimes I walk with a cane.I use a motorized scooter in stores.   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 Attempted to do SLNB but only fibroadipose tissue was removed. Returned to sx for removal of 4 negative LNs 05/14/22 . She had chemotherapy and immunotherapy that was not well tolerated and radiation. Presently having difficulty with neuropathy in her feet, and stiffness pain, especially knees down with trouble standing erect and numerous falls.  PAIN:  Are you having pain?  PAIN:  Are you having pain? Yes NPRS scale: 6/10 Pain location: lower leg Pain orientation: Lower  PAIN TYPE: aching and tight Pain description: intermittent  Aggravating factors: laying down, sitting Relieving factors: gabapentin   PRECAUTIONS: Left UE lymphedema risk, JRA, CIPN, FALL RISK  RED FLAGS: None   WEIGHT BEARING RESTRICTIONS: No, but pt is a significant fall risk  FALLS:  Has patient fallen in last 6 months? Yes. Number of falls 4  LIVING ENVIRONMENT: Lives with:  her sister, her 50 yr old son and sisters friend and daughter, and cousin Lives in: House/apartment Stairs: Yes; External: 20 steps; bilateral but cannot reach both Has following equipment at home: Single point cane and None  OCCUPATION: not working  LEISURE: read, play games, crosswords  HAND DOMINANCE: right   PRIOR LEVEL OF FUNCTION: Independent with household mobility with device  PATIENT GOALS: Decrease breast swelling, Get legs stronger   OBJECTIVE:  COGNITION: Overall cognitive status: Within functional limits for tasks assessed   PALPATION: Fibrosis especially left medial breast mid to lower  OBSERVATIONS / OTHER ASSESSMENTS: hyperpigmentation, Peau d'orange, fibrosis especially left medial breast superior to inferior,thickened skin  SENSATION: Light touch: Deficits especially feet   POSTURE: significant forward head, rounded shoulders, very flexed posture  UPPER EXTREMITY  AROM/PROM:  A/PROM RIGHT   eval   Shoulder extension   Shoulder flexion 135  Shoulder abduction 145  Shoulder internal rotation   Shoulder external rotation     (Blank rows = not tested)  A/PROM LEFT   eval  Shoulder extension   Shoulder flexion 105  Shoulder abduction 80  Shoulder internal rotation   Shoulder external rotation     (Blank rows = not tested)  UPPER EXTREMITY STRENGTH:  LYMPHEDEMA ASSESSMENTS:  SURGERY TYPE/DATE: Left lumpectomy 04/16/2022, Triple - NUMBER OF LYMPH NODES REMOVED: 0/4 CHEMOTHERAPY: YES and immunotherapy stopped due to low tolerance RADIATION:YES, 01/16/23-03/06/2023 HORMONE TREATMENT: NO INFECTIONS: NO  LYMPHEDEMA ASSESSMENTS:   LANDMARK RIGHT  eval  At axilla    15 cm proximal to olecranon process   10 cm proximal to olecranon process   Olecranon process   15 cm proximal to ulnar styloid process   10 cm proximal to ulnar styloid process   Just proximal to ulnar styloid process   Across hand at thumb web space   At base of 2nd digit   (Blank rows = not tested)  LANDMARK LEFT  eval  At axilla    15 cm proximal to olecranon process   10 cm proximal to olecranon process   Olecranon process   15 cm proximal to ulnar styloid process   10 cm proximal to ulnar styloid process   Just proximal to ulnar styloid process   Across hand at thumb web space   At base of 2nd digit   (Blank rows = not tested)   FUNCTIONAL TESTS:  Sit to stand;very slow, use of bilateral UE's, flexed posture  GAIT: Distance walked: slow and difficult rising from chair, slow, very flexed posture Assistive device utilized: no AD device today but usually uses a cane.  Uses a motorized cart in stores Level of assistance:  Needs to be SBA to min A for safety Comments:   L-DEX LYMPHEDEMA SCREENING: The patient was assessed using the L-Dex machine today to produce a lymphedema index baseline score. The patient will be reassessed on a regular basis (typically  every 3 months) to obtain new L-Dex scores. If the score is > 6.5 points away from his/her baseline score indicating onset of subclinical lymphedema, it will be recommended to wear a compression garment for 4 weeks, 12 hours per day and then be reassessed. If the score continues to be > 6.5 points from baseline at reassessment, we will initiate lymphedema treatment. Assessing in this manner has a 95% rate of preventing clinically significant lymphedema.  QUICK DASH SURVEY:  BREAST COMPLAINTS QUESTIONNAIRE Pain:6 Heaviness:5 Swollen feeling:6 Tense Skin:5 Redness:0 Bra Print:6 Size of Pores:7 Hard feeling: 7 Total:   42  /80 A Score over 9 indicates lymphedema issues in the breast   TODAY'S TREATMENT:                                                                                                                                          DATE:  08/14/2023 Therapeutic Exercises NuStep Level 5 x 10 mins seat 11/UE 9, 704 steps Sit to stand with 5# bell from table and  cushion 2 x 5 reps In // bars: 2# on each ankle to for following: SLS on blue oval for bil hip 3 way raises 2 x 10  Manual Therapy Focused on MLD and  self MLD for Lt breast: Short neck, 5 diaphragmatic breaths, Rt axillary nodes, Lt inguinal nodes, anterior inter-axillary and Lt axillo-inguinal anastomosis, then focused on Lt breast redirecting fluid towards anterior anastomosis. Needing cueing to work on pathway  direction, flat hand,stretch vs sliding, and direction  08/05/23 Manual Therapy Focused on self MLD for Lt breast: Short neck, 5 diaphragmatic breaths, Rt axillary nodes, Lt inguinal nodes, anterior inter-axillary and Lt axillo-inguinal anastomosis, then focused on Lt breast redirecting fluid towards anterior anastomosis. Needing cueing to work on pathway direction, stretch vs sliding, and breast anatomy and direction but overall did well. Gave note about swell spot for appt with [facility].   Therapeutic  Exercises NuStep Level 5 x 10 mins seat 11/UE 9, 715 steps Seated on red wobble cushion TrA and postural focus with slow march x 10 bil, LAQ x 10 bil Sit to stand with 5# bell from 2 cushions with vcs for nose over toes and to bring feet underneath her a bit more In // bars: 2# on each ankle to for following: SLS on blue oval for bil hip 3 way raises 2 x 10   07/31/23: Therapeutic Exercises NuStep Level 5 x 10 mins seat 11/UE 9, 715 steps  Seated EOB for bil HS stretch and then bil figure 4 piriformis stretch 2x, 20 sec holds each returning therapist demo.  Manual Therapy MLD to Lt breast: Short neck, superficial and deep abdominals, Rt axillary nodes and intact anterior thorax sequence, Lt inguinal nodes, anterior inter-axillary and Lt axillo-inguinal anastomosis, then focused on Lt breast redirecting fluid towards anterior anastomosis, then into Rt S/L for further focus to lateral breast redirecting towards lateral anastomosis, then finished retracing steps back to lymph nodes beginning to instruct pt in self MLD having her return demo briefly at end of session. Cuing for skin stretch, not slide   07/17/23: Therapeutic Exercises NuStep Level 5 x 10 mins seat 11/UE 9, 715 steps  Leg Press 40#, 2 x 10. Pt with good technique In // bars: 2# on each ankle to for following: SLS on blue oval for bil hip 3 way raises 1 x 12, then 1 x 10 with VC's required throughout to relax shoulders and correct LE position for abd while performing Seated edge of mat for rest but also used time to stretch: bil HS stretch 2 x, 20 sec each and then piriformis figure 4 stretch 2 x 20 sec each; next stood at end of // bars for quad stretch grabbing foot behind glut Manual Therapy MLD (shortened sequence due to time) to Lt breast at end of session: Short neck, 5 diaphragmatic breaths, Rt axillary nodes, Lt inguinal nodes, anterior inter-axillary and Lt axillo-inguinal anastomosis, then focused on Lt breast redirecting fluid  towards anterior anastomosis, then into Rt S/L for further focus to lateral breast redirecting towards lateral anastomosis, then finished retracing steps back to lymph nodes beginning to instruct pt in basics of anatomy of lymphatic system.   07/15/23: Therapeutic Exercises NuStep Level 5 x 10 mins seat 11/UE 8, 485 steps  Leg Press 30#, 2 x 10 In // bars: 2# on each ankle to for following: SLS on blue oval for bil hip 3 way raises 2 x 10 with VC's required throughout for erect posture/relax shoulders and correct LE position for abd while performing and after therapist demo Seated edge of mat for rest but also used time to stretch: bil HS stretch 2 x, 20 sec each and then piriformis figure 4 stretch 2 x 20 sec each returning therapist demo and VC's for correct technique In // bars for front heel-toe walking x4 with VC's throughout to remind pt to relax shoulders and for erect posture, then retro x 2  Wall slides x 5, pt reports fatigued at this point and ready to stop for the day. Manual Therapy MLD to Lt breast: Short neck, 5 diaphragmatic breaths,    PATIENT EDUCATION:  Education details: Bil LE strength in varying positions and HS stretch in sitting  Person educated: Patient Education method: Explanation and Handouts, demonstration and VC's Education comprehension: verbalized understanding, returned demonstration, VC's and pt will benefit from further review  HOME EXERCISE PROGRAM: 07/10/23: Bil LE strength and flexibility, see below  ASSESSMENT:  CLINICAL IMPRESSION: Reviewed self MLD for the breast and  pt  continues to need moderate cueing for technique, sequence and direction.  Pts legs felt much better after exercising, but her knee was sore. She is seeing MD next week for her knee   OBJECTIVE IMPAIRMENTS: Abnormal gait, decreased activity tolerance, decreased balance, decreased endurance, decreased knowledge of condition, decreased mobility, difficulty walking, decreased ROM,  decreased strength, increased edema, impaired sensation, impaired UE functional use, postural dysfunction, and pain.   ACTIVITY LIMITATIONS: carrying, bending, standing, squatting, sleeping, bathing, reach over head, and locomotion level  PARTICIPATION LIMITATIONS: meal prep, cleaning, laundry, community activity, and occupation  PERSONAL FACTORS: 3+ comorbidities: Triple Negative Breast Cancer s/p radiation and chemotherapy, immunotherapy, JRA,neuropathy  are also affecting patient's functional outcome.   REHAB POTENTIAL: Good  CLINICAL DECISION MAKING: Stable/uncomplicated  EVALUATION COMPLEXITY: Low  GOALS: Goals reviewed with patient? Yes  SHORT TERM GOALS: Target date: 07/31/2023  Pt will have decreased left breast pain /swelling by 25% Baseline: Goal status: INITIAL  2.  Pt will have improved left shoulder ROM flexion by atleast 15 degrees and abd by atleast 20 degrees  Baseline: 105/80 Goal status: INITIAL  3.  Pt will purchase compression bra for control of left breast Lymphedema Baseline:  Goal status: INITIAL  4.  Pt will be independent in a HEP for shoulder ROM and strength and LE strength Baseline:  Goal status: INITIAL   LONG TERM GOALS: Target date: 08/28/2023  Pt will have decreased left breast pain/swelling x 50% Baseline:  Goal status: INITIAL  2.  Pt will have Left shoulder flexion and abd increased to 120 degrees for improved reach Baseline:  Goal status: INITIAL  3.  Pt will be able to perform sit to stand from regular height chair without use of hands Baseline:  Goal status: INITIAL  4.  Pt will ambulate short household distances with more erect gait and confidence in balance Baseline:  Goal status: INITIAL  5.  Pt will be independent in left breast MLD Baseline:  Goal status: INITIAL  6.  Pt will be able to do short periods of shopping pushing a shopping cart instead of using motorized scooter Baseline:  Goal status:  INITIAL  PLAN:  PT FREQUENCY: 2x/week  PT DURATION: 8 weeks  PLANNED INTERVENTIONS: Therapeutic exercises, Therapeutic activity, Neuromuscular re-education, Balance training, Gait training, Patient/Family education, Self Care, Joint mobilization, Orthotic/Fit training, Manual lymph drainage, Manual therapy, and Re-evaluation  PLAN FOR NEXT SESSION:  RECERT next,cont shoulder AAROM, cont NU step for leg strength, cont LE strength and review and progress HEP; adding bil hip 3 way raises standing at counter Interested in Flexitouch?  Waynette Buttery, PT 08/14/2023, 11:54 AM  Self manual lymph drainage:     [facility] [phone] Perform this sequence once a day.  Only give enough pressure to your skin to make the skin move.  Hug yourself.  Do circles at your neck just above your collarbones.  Repeat this 10 times.  Diaphragmatic - Supine   Inhale through nose making navel move out toward hands. Exhale through puckered lips, hands follow navel in. Repeat _5__ times. Rest _10__ seconds between repeats.   Copyright  VHI. All rights reserved.    Axilla - One at a Time   Using full weight of flat hand and fingers at center of uninvolved armpit, make _10__ in-place circles.   Copyright  VHI. All rights reserved.  LEG: Inguinal Nodes Stimulation   With small finger side of hand against hip crease on involved side, gently perform circles at the crease. Repeat __10_ times.   Copyright  VHI. All rights reserved.  Axilla to Inguinal Nodes - Sweep   On involved side, sweep _4__ times from armpit along side of trunk to hip crease.  Now gently stretch skin from the involved side to the uninvolved side across the chest at the shoulder line.  Repeat that 4 times.  Draw an imaginary diagonal line from upper outer breast through the nipple area toward lower inner breast.  Direct fluid upward and inward from this line toward the pathway across your upper chest .  Do this in three rows to  treat all of the upper inner breast tissue, and do each row 3-4x.      Direct fluid to treat all of lower outer breast tissue downward and outward toward pathway that is aimed at the left groin. Do this laying on your right side.  Finish by doing the pathways as described above going from your involved armpit to the same side groin and going across your upper chest from the involved shoulder to the uninvolved shoulder.  Repeat the steps above where you do circles in your left groin and right armpit.

## 2023-08-14 NOTE — Progress Notes (Signed)
Office Visit Note  Patient: Gabrielle [name]             Date of Birth: 12-28-1990           MRN: [id number]             PCP: Norm Salt, PA Referring: Norm Salt, [place] Visit Date: 08/18/2023   Subjective:  Follow-up (Patient states she has knee and feet pain. Patient states she gets very stiff. )   History of Present Illness: Gabrielle [name] is a 32 y.o. female here for follow up for ongoing joint pain and intermittent swelling with history of JRA and of pembrolizumab treatment. After last visit she noticed a moderate improvement with taking meloxicam once daily. She ran out of the medication and experienced increase in symptoms again. She had an exacerbation of pain in her legs worst issue is the lower leg pain at night this also makes sleeping difficult. We increased gabapentin to 300 mg BID and this is partially helpful. Still notices mild swelling in both knees, worse on right side. Does not have as much leg pain or numbness while standing or walking.  Previous HPI 05/12/2023 Gabrielle [name] is a 32 y.o. female here for follow up for ongoing joint pain concern for possible inflammatory arthritis.  Lab testing at initial visit did show a very mildly elevated sedimentation rate of 25.  Otherwise was unremarkable for specific antibodies.  X-ray of the knee showed some considerable calcification at the patellar ligament insertion tibial tuberosity.  Still has ongoing knee pain worse on the right side and gets worse with prolonged use especially certain movements getting up from a long duration seated getting up from the floor or climbing stairs.  Gets visible swelling in the feet and ankles usually develops by the end of the day and is improved in the morning.   Previous HPI 04/10/23 Gabrielle [name] is a 32 y.o. female here for evaluation of joint pain especially knees. She has a history of previous JRA but was apparently never on long term DMARD treatment mostly symptoms controlled with  NSAIDs. Had been resolved without chronic joint inflammation and no surgery required. She was diagnosed with breast cancer last year and underwent lumpectomy and adjuvant chemotherapy regimen including paclitaxel, carboplatin, and [place] starting July last year. She had side effects with treatment including neuropathy in hands and feet started treatment with gabapentin for this. Also started to develop increased joint pain at multiple areas but particularly knees. She completed last infusion in November then completed radiation treatment in April. Many side effects and much of her overall body aches improved with still having knee pain. Does not see much visible swelling. Morning stiffness for a few minutes also has trouble getting up from sitting, worse after prolonged time in one position. Still has some pain in hands and feet but also sometimes prickly type sensation partially improved with gabapentin.   Review of Systems  Constitutional:  Positive for fatigue.  HENT:  Negative for mouth sores and mouth dryness.   Eyes:  Negative for dryness.  Respiratory:  Negative for shortness of breath.   Cardiovascular:  Negative for chest pain and palpitations.  Gastrointestinal:  Negative for blood in stool, constipation and diarrhea.  Endocrine: Negative for increased urination.  Genitourinary:  Negative for involuntary urination.  Musculoskeletal:  Positive for joint pain, gait problem, joint pain, morning stiffness and muscle tenderness. Negative for joint swelling, myalgias, muscle weakness and myalgias.  Skin:  Negative for color change, rash, hair loss and sensitivity to sunlight.  Allergic/Immunologic: Negative for susceptible to infections.  Neurological:  Negative for dizziness and headaches.  Hematological:  Negative for swollen glands.  Psychiatric/Behavioral:  Positive for sleep disturbance. Negative for depressed mood. The patient is not nervous/anxious.     PMFS History:  Patient Active  Problem List   Diagnosis Date Noted   Other insomnia 08/18/2023   History of juvenile arthritis 04/10/2023   Nausea with vomiting 09/19/2022   Joint pain 09/19/2022   Encounter for antineoplastic chemotherapy 09/19/2022   Peripheral neuropathy 09/19/2022   Hypokalemia 09/19/2022   Port-A-Cath in place 06/14/2022   Genetic testing 03/29/2022   Family history of breast cancer 03/21/2022   Family history of prostate cancer 03/21/2022   Malignant neoplasm of upper-outer quadrant of left breast in female, estrogen receptor negative (HCC) 03/15/2022    Past Medical History:  Diagnosis Date   Arthritis    Bilateral Knees   Cancer (HCC) 03/11/2022   Breast   Family history of breast cancer 03/21/2022   Family history of prostate cancer 03/21/2022   Hypercholesteremia    Juvenile arthritis (HCC)    Neuropathy     Family History  Problem Relation Age of Onset   Breast cancer Mother 81   Prostate cancer Father 68   Diabetes Brother    Breast cancer Paternal Grandmother        dx after 86   Prostate cancer Paternal Grandfather        dx 14s; metastatic   ADD / ADHD Son    Breast cancer Maternal Aunt 71   Breast cancer Paternal Aunt        dx unknown age   Prostate cancer Paternal Uncle        dx after 23   Cystic fibrosis Niece    Past Surgical History:  Procedure Laterality Date   AXILLARY SENTINEL NODE BIOPSY Left 05/14/2022   Procedure: LEFT AXILLARY SENTINEL NODE BIOPSY;  Surgeon: [name], MD;  Location: [facility] OR;  Service: General;  Laterality: Left;  GEN w/ PEC BLOCK   BREAST BIOPSY Left 03/11/2022   BREAST LUMPECTOMY WITH AXILLARY LYMPH NODE BIOPSY Left 04/16/2022   Procedure: LEFT BREAST LUMPECTOMY WITH LEFT AXILLARY SENTINEL LYMPH NODE BIOPSY;  Surgeon: [name], MD;  Location: [facility];  Service: General;  Laterality: Left;   DENTAL SURGERY     PORTACATH PLACEMENT Right 04/16/2022   Procedure: INSERTION PORT-A-CATH;  Surgeon:  [name], MD;  Location: [facility];  Service: General;  Laterality: Right;   Social History   Social History Narrative   Not on file   Immunization History  Administered Date(s) Administered   Tdap 12/19/2016     Objective: Vital Signs: BP 94/65 (BP Location: Right Arm, Patient Position: Sitting, Cuff Size: Normal)   Pulse 73   Resp 12   Ht 5' 9.25" (1.759 m)   Wt 189 lb (85.7 kg)   LMP 07/23/2023   BMI 27.71 kg/m    Physical Exam Eyes:     Conjunctiva/sclera: Conjunctivae normal.  Cardiovascular:     Rate and Rhythm: Normal rate and regular rhythm.  Pulmonary:     Effort: Pulmonary effort is normal.     Breath sounds: Normal breath sounds.  Lymphadenopathy:     Cervical: No cervical adenopathy.  Skin:    General: Skin is warm and dry.     Findings: No rash.  Neurological:  Mental Status: She is alert.  Psychiatric:        Mood and Affect: Mood normal.      Musculoskeletal Exam:  Shoulders full ROM no tenderness or swelling Wrists full ROM no tenderness or swelling Fingers full ROM no tenderness or swelling Knees full ROM, trace right knee swelling no focal tenderness Ankles full ROM no tenderness or swelling MTPs full ROM no tenderness or swelling   Investigation: No additional findings.  Imaging: No results found.  Recent Labs: Lab Results  Component Value Date   WBC 4.5 06/19/2023   HGB 12.4 06/19/2023   PLT 262 06/19/2023   NA 138 06/19/2023   K 3.7 06/19/2023   CL 105 06/19/2023   CO2 27 06/19/2023   GLUCOSE 82 06/19/2023   BUN 14 06/19/2023   CREATININE 0.84 06/19/2023   BILITOT 0.6 06/19/2023   ALKPHOS 99 06/19/2023   AST 25 06/19/2023   ALT 12 06/19/2023   PROT 6.8 06/19/2023   ALBUMIN 4.1 06/19/2023   CALCIUM 10.4 (H) 06/19/2023    Speciality Comments: No specialty comments available.  Procedures:  No procedures performed Allergies: Patient has no known allergies.   Assessment / Plan:     Visit  Diagnoses: Chronic pain of right knee - Plan: meloxicam (MOBIC) 15 MG tablet  Persistent joint pain in multiple areas still with some mild inflammation on exam of the right knee other areas appear normal.  If there is ongoing inflammatory arthritis it appears to be mild. She had a significant benefit on meloxicam so plan for now will be to resume meloxicam 15 mg once daily.  We can monitor and recheck inflammatory markers and repeat physical exam in a few months.  Other polyneuropathy - Plan: gabapentin (NEURONTIN) 300 MG capsule  The leg pain in her feet and below the knees that is worse at nighttime sounds more like neuropathy symptoms than arthritis.  Can continue gabapentin 300 mg twice daily.  Other insomnia  Reports a lot of sleep difficulty most often the prolonged latency she is not sure about the exact cause for this.  Does have increased discomfort at nighttime.  She does not see any sleep specialist and not currently on medication for this.   Orders: No orders of the defined types were placed in this encounter.  Meds ordered this encounter  Medications   meloxicam (MOBIC) 15 MG tablet    Sig: Take 1 tablet (15 mg total) by mouth daily.    Dispense:  90 tablet    Refill:  1   gabapentin (NEURONTIN) 300 MG capsule    Sig: Take 1 capsule (300 mg total) by mouth in the morning and at bedtime.    Dispense:  180 capsule    Refill:  1     Follow-Up Instructions: Return in about 6 months (around 02/15/2024) for OA/neuropathy/?RA on NSAID/gabapentin f/u 6mos.   Fuller Plan, MD  Note - This record has been created using [employer].  Chart creation errors have been sought, but may not always  have been located. Such creation errors do not reflect on  the standard of medical care.

## 2023-08-18 ENCOUNTER — Ambulatory Visit: Payer: Medicaid Other | Attending: Internal Medicine | Admitting: Internal Medicine

## 2023-08-18 ENCOUNTER — Other Ambulatory Visit: Payer: Self-pay | Admitting: *Deleted

## 2023-08-18 ENCOUNTER — Encounter: Payer: Self-pay | Admitting: Internal Medicine

## 2023-08-18 VITALS — BP 94/65 | HR 73 | Resp 12 | Ht 69.25 in | Wt 189.0 lb

## 2023-08-18 DIAGNOSIS — Z79899 Other long term (current) drug therapy: Secondary | ICD-10-CM

## 2023-08-18 DIAGNOSIS — M25561 Pain in right knee: Secondary | ICD-10-CM | POA: Diagnosis not present

## 2023-08-18 DIAGNOSIS — R5383 Other fatigue: Secondary | ICD-10-CM

## 2023-08-18 DIAGNOSIS — Z8739 Personal history of other diseases of the musculoskeletal system and connective tissue: Secondary | ICD-10-CM

## 2023-08-18 DIAGNOSIS — G4709 Other insomnia: Secondary | ICD-10-CM

## 2023-08-18 DIAGNOSIS — G479 Sleep disorder, unspecified: Secondary | ICD-10-CM

## 2023-08-18 DIAGNOSIS — G6289 Other specified polyneuropathies: Secondary | ICD-10-CM

## 2023-08-18 DIAGNOSIS — G8929 Other chronic pain: Secondary | ICD-10-CM

## 2023-08-18 MED ORDER — GABAPENTIN 300 MG PO CAPS
300.0000 mg | ORAL_CAPSULE | Freq: Two times a day (BID) | ORAL | 1 refills | Status: DC
Start: 2023-08-18 — End: 2024-08-09

## 2023-08-18 MED ORDER — MELOXICAM 15 MG PO TABS
15.0000 mg | ORAL_TABLET | Freq: Every day | ORAL | 1 refills | Status: DC
Start: 2023-08-18 — End: 2024-08-09

## 2023-08-19 ENCOUNTER — Other Ambulatory Visit: Payer: Self-pay

## 2023-08-19 ENCOUNTER — Ambulatory Visit: Payer: Commercial Managed Care - HMO | Attending: Nurse Practitioner

## 2023-08-19 DIAGNOSIS — R293 Abnormal posture: Secondary | ICD-10-CM | POA: Diagnosis present

## 2023-08-19 DIAGNOSIS — Z483 Aftercare following surgery for neoplasm: Secondary | ICD-10-CM | POA: Insufficient documentation

## 2023-08-19 DIAGNOSIS — Z171 Estrogen receptor negative status [ER-]: Secondary | ICD-10-CM | POA: Insufficient documentation

## 2023-08-19 DIAGNOSIS — M6281 Muscle weakness (generalized): Secondary | ICD-10-CM | POA: Diagnosis present

## 2023-08-19 DIAGNOSIS — I890 Lymphedema, not elsewhere classified: Secondary | ICD-10-CM | POA: Diagnosis present

## 2023-08-19 DIAGNOSIS — M25612 Stiffness of left shoulder, not elsewhere classified: Secondary | ICD-10-CM | POA: Diagnosis present

## 2023-08-19 DIAGNOSIS — C50412 Malignant neoplasm of upper-outer quadrant of left female breast: Secondary | ICD-10-CM | POA: Insufficient documentation

## 2023-08-19 DIAGNOSIS — R262 Difficulty in walking, not elsewhere classified: Secondary | ICD-10-CM | POA: Diagnosis present

## 2023-08-19 NOTE — Therapy (Signed)
OUTPATIENT PHYSICAL THERAPY  ONCOLOGY TREATMENT  Patient Name: Gabrielle [name] MRN: [id number] DOB:[birth year], 32 y.o., female Today's Date: 08/19/2023  END OF SESSION:  PT End of Session - 08/19/23 1109     Visit Number 8    Number of Visits 16    Date for PT Re-Evaluation 08/28/23    Authorization Type Medicaid    Authorization Time Period 07/03/23-08/31/23    Authorization - Visit Number 7    Authorization - Number of Visits 7    PT Start Time 1104    PT Stop Time 1209    PT Time Calculation (min) 65 min    Activity Tolerance Patient tolerated treatment well    Behavior During Therapy [facility] for tasks assessed/performed             Past Medical History:  Diagnosis Date   Arthritis    Bilateral Knees   Cancer (HCC) 03/11/2022   Breast   Family history of breast cancer 03/21/2022   Family history of prostate cancer 03/21/2022   Hypercholesteremia    Juvenile arthritis (HCC)    Neuropathy    Past Surgical History:  Procedure Laterality Date   AXILLARY SENTINEL NODE BIOPSY Left 05/14/2022   Procedure: LEFT AXILLARY SENTINEL NODE BIOPSY;  Surgeon: [name], MD;  Location: [facility] OR;  Service: General;  Laterality: Left;  GEN w/ PEC BLOCK   BREAST BIOPSY Left 03/11/2022   BREAST LUMPECTOMY WITH AXILLARY LYMPH NODE BIOPSY Left 04/16/2022   Procedure: LEFT BREAST LUMPECTOMY WITH LEFT AXILLARY SENTINEL LYMPH NODE BIOPSY;  Surgeon: [name], MD;  Location: [facility];  Service: General;  Laterality: Left;   DENTAL SURGERY     PORTACATH PLACEMENT Right 04/16/2022   Procedure: INSERTION PORT-A-CATH;  Surgeon: [name], MD;  Location: [facility];  Service: General;  Laterality: Right;   Patient Active Problem List   Diagnosis Date Noted   Other insomnia 08/18/2023   History of juvenile arthritis 04/10/2023   Nausea with vomiting 09/19/2022   Joint pain 09/19/2022   Encounter for antineoplastic chemotherapy 09/19/2022    Peripheral neuropathy 09/19/2022   Hypokalemia 09/19/2022   Port-A-Cath in place 06/14/2022   Genetic testing 03/29/2022   Family history of breast cancer 03/21/2022   Family history of prostate cancer 03/21/2022   Malignant neoplasm of upper-outer quadrant of left breast in female, estrogen receptor negative (HCC) 03/15/2022     REFERRING PROVIDER: Santiago Glad, NP  REFERRING DIAG: Left breast Lymphedema, deconditioning  THERAPY DIAG:  Malignant neoplasm of upper-outer quadrant of left breast in female, estrogen receptor negative (HCC)  Aftercare following surgery for neoplasm  Lymphedema, not elsewhere classified  ONSET DATE: 03/06/2023  Rationale for Evaluation and Treatment: Rehabilitation  SUBJECTIVE:  SUBJECTIVE STATEMENT:  I saw the rheumatologist and he did an [place] and said I have fluid in my Rt knee from the rheumatoid arthritis so he is keeping me on meloxicam for awhile. Overall my balance feels much better, it's just my legs still feel weak sometimes.   Eval: At night when in the bed her legs ache a lot even with the gabapentin, but mainly at night.  Feet are very painful. She  feels stiff from the knees down. If sitting greater than 5 minutes I feel really stiff. I can't straighten up my back with standing. Breast swelling started after radiation and joint pain started with the Keytruda. Breast has shooting pain intermittently, and she notices firm spots, and enlarged pores. The breast feels heavy but it is not tender. The leg discomfort makes stairs difficult, getting up from low furniture is hard. Sometimes I need help. I fall a lot because my legs get twisted up. My legs and my feet swell up. Sometimes I walk with a cane.I use a motorized scooter in stores.   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 Attempted to do SLNB but only fibroadipose tissue was removed. Returned to sx for removal of 4 negative LNs 05/14/22 . She had chemotherapy and immunotherapy that was not well tolerated and radiation. Presently having difficulty with neuropathy in her feet, and stiffness pain, especially knees down with trouble standing erect and numerous falls.  PAIN:  Are you having pain?  PAIN:  Are you having pain? No, not currently   PRECAUTIONS: Left UE lymphedema risk, JRA, CIPN, FALL RISK  RED FLAGS: None   WEIGHT BEARING RESTRICTIONS: No, but pt is a significant fall risk  FALLS:  Has patient fallen in last 6 months? Yes. Number of falls 4  LIVING ENVIRONMENT: Lives with:  her sister, her 82 yr old son and sisters friend and daughter, and cousin Lives in: House/apartment Stairs: Yes; External: 20 steps; bilateral but cannot reach both Has following equipment at home: Single point cane and None  OCCUPATION: not working  LEISURE: read, play games, crosswords  HAND DOMINANCE: right   PRIOR LEVEL OF FUNCTION: Independent with household mobility with device  PATIENT GOALS: Decrease breast swelling, Get legs stronger   OBJECTIVE:  COGNITION: Overall cognitive status: Within functional limits for tasks assessed   PALPATION: Fibrosis especially left medial breast mid to lower  OBSERVATIONS / OTHER ASSESSMENTS: hyperpigmentation, Peau d'orange, fibrosis especially left medial breast superior to inferior,thickened skin  SENSATION: Light touch: Deficits especially feet   POSTURE: significant forward head, rounded shoulders, very flexed posture  UPPER EXTREMITY AROM/PROM:  A/PROM RIGHT   eval   Shoulder extension   Shoulder flexion 135  Shoulder abduction 145  Shoulder internal rotation   Shoulder external rotation     (Blank rows = not tested)  A/PROM LEFT    eval 08/19/23  Shoulder extension    Shoulder flexion 105 140  Shoulder abduction 80 116  Shoulder internal rotation    Shoulder external rotation      (Blank rows = not tested)  UPPER EXTREMITY STRENGTH:  LYMPHEDEMA ASSESSMENTS:  SURGERY TYPE/DATE: Left lumpectomy 04/16/2022, Triple - NUMBER OF LYMPH NODES REMOVED: 0/4  CHEMOTHERAPY: YES and immunotherapy stopped due to low tolerance RADIATION:YES, 01/16/23-03/06/2023 HORMONE TREATMENT: NO INFECTIONS: NO  LYMPHEDEMA ASSESSMENTS:   LANDMARK RIGHT  eval  At axilla    15 cm proximal to olecranon process   10  cm proximal to olecranon process   Olecranon process   15 cm proximal to ulnar styloid process   10 cm proximal to ulnar styloid process   Just proximal to ulnar styloid process   Across hand at thumb web space   At base of 2nd digit   (Blank rows = not tested)  LANDMARK LEFT  eval  At axilla    15 cm proximal to olecranon process   10 cm proximal to olecranon process   Olecranon process   15 cm proximal to ulnar styloid process   10 cm proximal to ulnar styloid process   Just proximal to ulnar styloid process   Across hand at thumb web space   At base of 2nd digit   (Blank rows = not tested)   FUNCTIONAL TESTS:  Sit to stand;very slow, use of bilateral UE's, flexed posture  GAIT: Distance walked: slow and difficult rising from chair, slow, very flexed posture Assistive device utilized: no AD device today but usually uses a cane.  Uses a motorized cart in stores Level of assistance:  Needs to be SBA to min A for safety Comments:   L-DEX LYMPHEDEMA SCREENING: The patient was assessed using the L-Dex machine today to produce a lymphedema index baseline score. The patient will be reassessed on a regular basis (typically every 3 months) to obtain new L-Dex scores. If the score is > 6.5 points away from his/her baseline score indicating onset of subclinical lymphedema, it will be recommended to wear a compression  garment for 4 weeks, 12 hours per day and then be reassessed. If the score continues to be > 6.5 points from baseline at reassessment, we will initiate lymphedema treatment. Assessing in this manner has a 95% rate of preventing clinically significant lymphedema.  QUICK DASH SURVEY:  BREAST COMPLAINTS QUESTIONNAIRE Pain:6 Heaviness:5 Swollen feeling:6 Tense Skin:5 Redness:0 Bra Print:6 Size of Pores:7 Hard feeling: 7 Total:   42  /80 A Score over 9 indicates lymphedema issues in the breast   TODAY'S TREATMENT:                                                                                                                                          DATE:  08/19/23: Therapeutic Exercises NuStep Level 5 x 10 mins seat 11/UE 9, 712 steps Sit to stand with 5# bell from table and  cushion 2 x 5 reps In // bars: 2# on each ankle to for following: SLS on blue oval for bil hip 3 way raises 2 x 10, second set of 10 increased weight to 3#; VC's during for erect posture and core engagement Manual Therapy MLD: Short neck, superficial and deep abdominals, Rt axillary nodes, Rt intact anterior thorax, anterior inter-axillary anastomosis, Lt inguinal nodes, Lt axillo-inguinal anastomosis, then focused on Lt breast redirecting fluid towards anterior anastomosis, next in Rt S/L for Lt lateral breast redirecting towards lateral  anastomosis, then finished retracing all steps in supine STM gently to Lt axilla during P/ROM, pt very tight and tender to touch here where thick scar tissue palpable P/ROM to Lt shoulder into flex, abd, and D2 with scapular depression throughout  08/14/2023 Therapeutic Exercises NuStep Level 5 x 10 mins seat 11/UE 9, 704 steps Sit to stand with 5# bell from table and  cushion 2 x 5 reps In // bars: 2# on each ankle to for following: SLS on blue oval for bil hip 3 way raises 2 x 10  Manual Therapy Focused on MLD and  self MLD for Lt breast: Short neck, 5 diaphragmatic breaths, Rt  axillary nodes, Lt inguinal nodes, anterior inter-axillary and Lt axillo-inguinal anastomosis, then focused on Lt breast redirecting fluid towards anterior anastomosis. Needing cueing to work on pathway direction, flat hand,stretch vs sliding, and direction  08/05/23 Manual Therapy Focused on self MLD for Lt breast: Short neck, 5 diaphragmatic breaths, Rt axillary nodes, Lt inguinal nodes, anterior inter-axillary and Lt axillo-inguinal anastomosis, then focused on Lt breast redirecting fluid towards anterior anastomosis. Needing cueing to work on pathway direction, stretch vs sliding, and breast anatomy and direction but overall did well. Gave note about swell spot for appt with [facility] to [facility] today.   Therapeutic Exercises NuStep Level 5 x 10 mins seat 11/UE 9, 715 steps Seated on red wobble cushion TrA and postural focus with slow march x 10 bil, LAQ x 10 bil Sit to stand with 5# bell from 2 cushions with vcs for nose over toes and to bring feet underneath her a bit more In // bars: 2# on each ankle to for following: SLS on blue oval for bil hip 3 way raises 2 x 10     PATIENT EDUCATION:  Education details: Bil LE strength in varying positions and HS stretch in sitting  Person educated: Patient Education method: Explanation and Handouts, demonstration and VC's Education comprehension: verbalized understanding, returned demonstration, VC's and pt will benefit from further review  HOME EXERCISE PROGRAM: 07/10/23: Bil LE strength and flexibility, see below  ASSESSMENT:  CLINICAL IMPRESSION: Medicaid renewal done this session. Pt is progressing well towards improved Lt shoulder A/ROM meeting her flex goal and only lacking abduction goal by 4 degrees. Pt does still have tightness in her Lt axilla that limits her end ROM and is tender to touch with STM. She will benefit from further desensitization here to allow for improved A/ROM. She is almost independent with self MLD, she will benefit from  further review of her technique. She is able to transition from sit to stand without use of hands now and walk around store for short errands without scooter meeting both of those goals. Pt will benefit from continued physical therapy at this time to work towards unmet goals and to decrease tightness/tenderness in Lt axilla improving her end A/ROM.    OBJECTIVE IMPAIRMENTS: Abnormal gait, decreased activity tolerance, decreased balance, decreased endurance, decreased knowledge of condition, decreased mobility, difficulty walking, decreased ROM, decreased strength, increased edema, impaired sensation, impaired UE functional use, postural dysfunction, and pain.   ACTIVITY LIMITATIONS: carrying, bending, standing, squatting, sleeping, bathing, reach over head, and locomotion level  PARTICIPATION LIMITATIONS: meal prep, cleaning, laundry, community activity, and occupation  PERSONAL FACTORS: 3+ comorbidities: Triple Negative Breast Cancer s/p radiation and chemotherapy, immunotherapy, JRA,neuropathy  are also affecting patient's functional outcome.   REHAB POTENTIAL: Good  CLINICAL DECISION MAKING: Stable/uncomplicated  EVALUATION COMPLEXITY: Low  GOALS: Goals reviewed with patient? Yes  SHORT TERM GOALS: Target date: 07/31/2023  Pt will have decreased left breast pain /swelling by 25% Baseline: 08/19/23: 50% reported  Goal status: MET  2.  Pt will have improved left shoulder ROM flexion by atleast 15 degrees and abd by atleast 20 degrees  Baseline: 105/80; 08/19/23: 140/116 Goal status: MET  3.  Pt will purchase compression bra for control of left breast Lymphedema Baseline:  Goal status: MET  4.  Pt will be independent in a HEP for shoulder ROM and strength and LE strength Baseline:  Goal status: MET   LONG TERM GOALS: Target date: 08/28/2023  Pt will have decreased left breast pain/swelling x 50% Baseline:  Goal status: MET  2.  Pt will have Left shoulder flexion and abd  increased to 120 degrees for improved reach Baseline: 08/19/23: Lt shoulder flex 140 and abd 116 degrees Goal status: PARTIALLY MET  3.  Pt will be able to perform sit to stand from regular height chair without use of hands Baseline:  Goal status: MET  4.  Pt will ambulate short household distances with more erect gait and confidence in balance Baseline: 08/19/23 - pt reports balance feels better but legs still feel weak Goal status: PARTIALLY MET  5.  Pt will be independent in left breast MLD Baseline: 08/19/23: Pt is doing well with this but will benefit from further review Goal status: PROGRESSING  6.  Pt will be able to do short periods of shopping pushing a shopping cart instead of using motorized scooter Baseline:  Goal status: MET  PLAN:  PT FREQUENCY: 2x/week  PT DURATION: 8 weeks  PLANNED INTERVENTIONS: Therapeutic exercises, Therapeutic activity, Neuromuscular re-education, Balance training, Gait training, Patient/Family education, Self Care, Joint mobilization, Orthotic/Fit training, Manual lymph drainage, Manual therapy, and Re-evaluation  PLAN FOR NEXT SESSION:  RECERT next,cont shoulder AAROM, cont NU step for leg strength, cont LE strength and review and progress HEP; adding bil hip 3 way raises standing at counter Interested in Flexitouch?  Hermenia Bers, PTA 08/19/2023, 12:27 PM  Self manual lymph drainage:     [facility] [phone] Perform this sequence once a day.  Only give enough pressure to your skin to make the skin move.  Hug yourself.  Do circles at your neck just above your collarbones.  Repeat this 10 times.  Diaphragmatic - Supine   Inhale through nose making navel move out toward hands. Exhale through puckered lips, hands follow navel in. Repeat _5__ times. Rest _10__ seconds between repeats.   Copyright  VHI. All rights reserved.    Axilla - One at a Time   Using full weight of flat hand and fingers at center of uninvolved  armpit, make _10__ in-place circles.   Copyright  VHI. All rights reserved.  LEG: Inguinal Nodes Stimulation   With small finger side of hand against hip crease on involved side, gently perform circles at the crease. Repeat __10_ times.   Copyright  VHI. All rights reserved.  Axilla to Inguinal Nodes - Sweep   On involved side, sweep _4__ times from armpit along side of trunk to hip crease.  Now gently stretch skin from the involved side to the uninvolved side across the chest at the shoulder line.  Repeat that 4 times.  Draw an imaginary diagonal line from upper outer breast through the nipple area toward lower inner breast.  Direct fluid upward and inward from this line toward the pathway across your upper chest .  Do this in three rows  to treat all of the upper inner breast tissue, and do each row 3-4x.      Direct fluid to treat all of lower outer breast tissue downward and outward toward pathway that is aimed at the left groin. Do this laying on your right side.  Finish by doing the pathways as described above going from your involved armpit to the same side groin and going across your upper chest from the involved shoulder to the uninvolved shoulder.  Repeat the steps above where you do circles in your left groin and right armpit.

## 2023-08-20 ENCOUNTER — Encounter: Payer: Self-pay | Admitting: Hematology

## 2023-08-21 ENCOUNTER — Ambulatory Visit: Payer: Commercial Managed Care - HMO

## 2023-08-21 DIAGNOSIS — Z483 Aftercare following surgery for neoplasm: Secondary | ICD-10-CM

## 2023-08-21 DIAGNOSIS — M6281 Muscle weakness (generalized): Secondary | ICD-10-CM

## 2023-08-21 DIAGNOSIS — R262 Difficulty in walking, not elsewhere classified: Secondary | ICD-10-CM

## 2023-08-21 DIAGNOSIS — R293 Abnormal posture: Secondary | ICD-10-CM

## 2023-08-21 DIAGNOSIS — C50412 Malignant neoplasm of upper-outer quadrant of left female breast: Secondary | ICD-10-CM | POA: Diagnosis not present

## 2023-08-21 DIAGNOSIS — M25612 Stiffness of left shoulder, not elsewhere classified: Secondary | ICD-10-CM

## 2023-08-21 DIAGNOSIS — I890 Lymphedema, not elsewhere classified: Secondary | ICD-10-CM

## 2023-08-21 DIAGNOSIS — Z171 Estrogen receptor negative status [ER-]: Secondary | ICD-10-CM

## 2023-08-21 NOTE — Therapy (Signed)
OUTPATIENT PHYSICAL THERAPY  ONCOLOGY TREATMENT  Patient Name: Gabrielle [name] MRN: [id number] DOB:[birth year], 32 y.o., female Today's Date: 08/21/2023  END OF SESSION:  PT End of Session - 08/21/23 1108     Visit Number 9    Number of Visits 21    Date for PT Re-Evaluation 10/02/23    Authorization Type Medicaid    Authorization Time Period 10/3-11/11/2022    Authorization - Visit Number 1    Authorization - Number of Visits 3    Progress Note Due on Visit 11    PT Start Time 1105    PT Stop Time 1150    PT Time Calculation (min) 45 min    Activity Tolerance Patient tolerated treatment well    Behavior During Therapy [facility] for tasks assessed/performed              Past Medical History:  Diagnosis Date   Arthritis    Bilateral Knees   Cancer (HCC) 03/11/2022   Breast   Family history of breast cancer 03/21/2022   Family history of prostate cancer 03/21/2022   Hypercholesteremia    Juvenile arthritis (HCC)    Neuropathy    Past Surgical History:  Procedure Laterality Date   AXILLARY SENTINEL NODE BIOPSY Left 05/14/2022   Procedure: LEFT AXILLARY SENTINEL NODE BIOPSY;  Surgeon: [name], MD;  Location: [facility];  Service: General;  Laterality: Left;  GEN w/ PEC BLOCK   BREAST BIOPSY Left 03/11/2022   BREAST LUMPECTOMY WITH AXILLARY LYMPH NODE BIOPSY Left 04/16/2022   Procedure: LEFT BREAST LUMPECTOMY WITH LEFT AXILLARY SENTINEL LYMPH NODE BIOPSY;  Surgeon: [name], MD;  Location: [facility];  Service: General;  Laterality: Left;   DENTAL SURGERY     PORTACATH PLACEMENT Right 04/16/2022   Procedure: INSERTION PORT-A-CATH;  Surgeon: [name], MD;  Location: [facility];  Service: General;  Laterality: Right;   Patient Active Problem List   Diagnosis Date Noted   Other insomnia 08/18/2023   History of juvenile arthritis 04/10/2023   Nausea with vomiting 09/19/2022   Joint pain 09/19/2022   Encounter for  antineoplastic chemotherapy 09/19/2022   Peripheral neuropathy 09/19/2022   Hypokalemia 09/19/2022   Port-A-Cath in place 06/14/2022   Genetic testing 03/29/2022   Family history of breast cancer 03/21/2022   Family history of prostate cancer 03/21/2022   Malignant neoplasm of upper-outer quadrant of left breast in female, estrogen receptor negative (HCC) 03/15/2022     REFERRING PROVIDER: Santiago Glad, NP  REFERRING DIAG: Left breast Lymphedema, deconditioning  THERAPY DIAG:  Malignant neoplasm of upper-outer quadrant of left breast in female, estrogen receptor negative (HCC)  Aftercare following surgery for neoplasm  Lymphedema, not elsewhere classified  Muscle weakness (generalized)  Difficulty walking  Abnormal posture  Stiffness of left shoulder, not elsewhere classified  ONSET DATE: 03/06/2023  Rationale for Evaluation and Treatment: Rehabilitation  SUBJECTIVE:  SUBJECTIVE STATEMENT:  08/21/2023 My legs really hurt today. I woke up like this. I can stand up straighter now. Balance is a little better, but it is still off. I nearly fell a few weeks ago but my sister caught me. I get tripped over my feet. I have to be sure I hold the rail going up and down stairs. Its a little better. They told me I have RA and fluid in my knee. With the neuropathy it makes my legs and feet hurt a lot. The gabapentin doesn't help a lot especially at night. I am wearing my compression bra and my breast is not as swollen as I was. Its not hurting like it was and has been better. My legs are wobbly when I first get up.  Eval: At night when in the bed her legs ache a lot even with the gabapentin, but mainly at night.  Feet are very painful. She  feels stiff from the knees down. If sitting greater than 5 minutes I  feel really stiff. I can't straighten up my back with standing. Breast swelling started after radiation and joint pain started with the Keytruda. Breast has shooting pain intermittently, and she notices firm spots, and enlarged pores. The breast feels heavy but it is not tender. The leg discomfort makes stairs difficult, getting up from low furniture is hard. Sometimes I need help. I fall a lot because my legs get twisted up. My legs and my feet swell up. Sometimes I walk with a cane.I use a motorized scooter in stores.   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 Attempted to do SLNB but only fibroadipose tissue was removed. Returned to sx for removal of 4 negative LNs 05/14/22 . She had chemotherapy and immunotherapy that was not well tolerated and radiation. Presently having difficulty with neuropathy in her feet, and stiffness pain, especially knees down with trouble standing erect and numerous falls.  PAIN:  Are you having pain?  PAIN:  Are you having pain? No, not currently   PRECAUTIONS: Left UE lymphedema risk, JRA, CIPN, FALL RISK  RED FLAGS: None   WEIGHT BEARING RESTRICTIONS: No, but pt is a significant fall risk  FALLS:  Has patient fallen in last 6 months? Yes. Number of falls 4  LIVING ENVIRONMENT: Lives with:  her sister, her 22 yr old son and sisters friend and daughter, and cousin Lives in: House/apartment Stairs: Yes; External: 20 steps; bilateral but cannot reach both Has following equipment at home: Single point cane and None  OCCUPATION: not working  LEISURE: read, play games, crosswords  HAND DOMINANCE: right   PRIOR LEVEL OF FUNCTION: Independent with household mobility with device  PATIENT GOALS: Decrease breast swelling, Get legs stronger   OBJECTIVE:  COGNITION: Overall cognitive status: Within functional limits for tasks assessed   PALPATION: Fibrosis  especially left medial breast mid to lower  OBSERVATIONS / OTHER ASSESSMENTS: hyperpigmentation, Peau d'orange, fibrosis especially left medial breast superior to inferior,thickened skin  SENSATION: Light touch: Deficits especially feet   POSTURE: significant forward head, rounded shoulders, very flexed posture  UPPER EXTREMITY AROM/PROM:  A/PROM RIGHT   eval   Shoulder extension   Shoulder flexion 135  Shoulder abduction 145  Shoulder internal rotation   Shoulder external rotation     (Blank rows = not tested)  A/PROM LEFT   eval 08/19/23  Shoulder extension    Shoulder flexion 105 140  Shoulder abduction  80 116  Shoulder internal rotation    Shoulder external rotation      (Blank rows = not tested)  UPPER EXTREMITY STRENGTH:  LYMPHEDEMA ASSESSMENTS:  SURGERY TYPE/DATE: Left lumpectomy 04/16/2022, Triple - NUMBER OF LYMPH NODES REMOVED: 0/4  CHEMOTHERAPY: YES and immunotherapy stopped due to low tolerance RADIATION:YES, 01/16/23-03/06/2023 HORMONE TREATMENT: NO INFECTIONS: NO  LYMPHEDEMA ASSESSMENTS:   LANDMARK RIGHT  eval  At axilla    15 cm proximal to olecranon process   10 cm proximal to olecranon process   Olecranon process   15 cm proximal to ulnar styloid process   10 cm proximal to ulnar styloid process   Just proximal to ulnar styloid process   Across hand at thumb web space   At base of 2nd digit   (Blank rows = not tested)  LANDMARK LEFT  eval  At axilla    15 cm proximal to olecranon process   10 cm proximal to olecranon process   Olecranon process   15 cm proximal to ulnar styloid process   10 cm proximal to ulnar styloid process   Just proximal to ulnar styloid process   Across hand at thumb web space   At base of 2nd digit   (Blank rows = not tested)   FUNCTIONAL TESTS:  Sit to stand;very slow, use of bilateral UE's, flexed posture  08/21/2023  4 POSITION BALANCE TEST; Able to maintain all x 10 sec except SLS  GAIT: Distance  walked: slow and difficult rising from chair, slow, very flexed posture Assistive device utilized: no AD device today but usually uses a cane.  Uses a motorized cart in stores Level of assistance:  Needs to be SBA to min A for safety Comments:   L-DEX LYMPHEDEMA SCREENING: The patient was assessed using the L-Dex machine today to produce a lymphedema index baseline score. The patient will be reassessed on a regular basis (typically every 3 months) to obtain new L-Dex scores. If the score is > 6.5 points away from his/her baseline score indicating onset of subclinical lymphedema, it will be recommended to wear a compression garment for 4 weeks, 12 hours per day and then be reassessed. If the score continues to be > 6.5 points from baseline at reassessment, we will initiate lymphedema treatment. Assessing in this manner has a 95% rate of preventing clinically significant lymphedema.  QUICK DASH SURVEY:  BREAST COMPLAINTS QUESTIONNAIRE Pain:6 Heaviness:5 Swollen feeling:6 Tense Skin:5 Redness:0 Bra Print:6 Size of Pores:7 Hard feeling: 7 Total:   42  /80 A Score over 9 indicates lymphedema issues in the breast   TODAY'S TREATMENT:                                                                                                                                          DATE:  08/21/2023 NuStep Level 5 x 10 mins seat 11/UE 9, 546 steps  4  POSITION BALANCE TEST; Able to maintain all x 10 sec except SLS Sit to stand from slightly higher table x 10 with emphasis on getting to erect posture Tandem stance B x 30 sec Standing march no hands x 10 ea 3D hip 2# each 2 x 10 with HH Step and hold on airex forward and sideways x 10 ea with intermittent hand hold 08/19/23: Therapeutic Exercises NuStep Level 5 x 10 mins seat 11/UE 9, 712 steps Sit to stand with 5# bell from table and  cushion 2 x 5 reps In // bars: 2# on each ankle to for following: SLS on blue oval for bil hip 3 way raises 2 x 10,  second set of 10 increased weight to 3#; VC's during for erect posture and core engagement Manual Therapy MLD: Short neck, superficial and deep abdominals, Rt axillary nodes, Rt intact anterior thorax, anterior inter-axillary anastomosis, Lt inguinal nodes, Lt axillo-inguinal anastomosis, then focused on Lt breast redirecting fluid towards anterior anastomosis, next in Rt S/L for Lt lateral breast redirecting towards lateral anastomosis, then finished retracing all steps in supine STM gently to Lt axilla during P/ROM, pt very tight and tender to touch here where thick scar tissue palpable P/ROM to Lt shoulder into flex, abd, and D2 with scapular depression throughout  08/14/2023 Therapeutic Exercises NuStep Level 5 x 10 mins seat 11/UE 9, 704 steps Sit to stand with 5# bell from table and  cushion 2 x 5 reps In // bars: 2# on each ankle to for following: SLS on blue oval for bil hip 3 way raises 2 x 10  Manual Therapy Focused on MLD and  self MLD for Lt breast: Short neck, 5 diaphragmatic breaths, Rt axillary nodes, Lt inguinal nodes, anterior inter-axillary and Lt axillo-inguinal anastomosis, then focused on Lt breast redirecting fluid towards anterior anastomosis. Needing cueing to work on pathway direction, flat hand,stretch vs sliding, and direction  08/05/23 Manual Therapy Focused on self MLD for Lt breast: Short neck, 5 diaphragmatic breaths, Rt axillary nodes, Lt inguinal nodes, anterior inter-axillary and Lt axillo-inguinal anastomosis, then focused on Lt breast redirecting fluid towards anterior anastomosis. Needing cueing to work on pathway direction, stretch vs sliding, and breast anatomy and direction but overall did well. Gave note about swell spot for appt with [facility] to [facility] today.   Therapeutic Exercises NuStep Level 5 x 10 mins seat 11/UE 9, 715 steps Seated on red wobble cushion TrA and postural focus with slow march x 10 bil, LAQ x 10 bil Sit to stand with 5# bell from 2  cushions with vcs for nose over toes and to bring feet underneath her a bit more In // bars: 2# on each ankle to for following: SLS on blue oval for bil hip 3 way raises 2 x 10     PATIENT EDUCATION:  Education details: Bil LE strength in varying positions and HS stretch in sitting  Person educated: Patient Education method: Explanation and Handouts, demonstration and VC's Education comprehension: verbalized understanding, returned demonstration, VC's and pt will benefit from further review  HOME EXERCISE PROGRAM: 07/10/23: Bil LE strength and flexibility, see below  ASSESSMENT:  CLINICAL IMPRESSION:MD renewal done this session. Pt is progressing well towards improved Lt shoulder A/ROM meeting her flex goal and only lacking abduction goal by 4 degrees. Pt does still have tightness in her Lt axilla that limits her end ROM and is tender to touch with STM. She will benefit from further desensitization here to allow for improved A/ROM. She  is almost independent with self MLD, she will benefit from further review of her technique. She is able to transition from sit to stand without use of hands now and walk around store for short errands without scooter meeting both of those goals. Pt will benefit from continued physical therapy at this time to work towards unmet goals and to decrease tightness/tenderness in Lt axilla improving her end A/ROM and to continue to strengthen LE's and improve balance to decrease fall risk. She continues to have pain and neuropathy that can be severe at times, and she was recently diagnosed with RA.  OBJECTIVE IMPAIRMENTS: Abnormal gait, decreased activity tolerance, decreased balance, decreased endurance, decreased knowledge of condition, decreased mobility, difficulty walking, decreased ROM, decreased strength, increased edema, impaired sensation, impaired UE functional use, postural dysfunction, and pain.   ACTIVITY LIMITATIONS: carrying, bending, standing, squatting,  sleeping, bathing, reach over head, and locomotion level  PARTICIPATION LIMITATIONS: meal prep, cleaning, laundry, community activity, and occupation  PERSONAL FACTORS: 3+ comorbidities: Triple Negative Breast Cancer s/p radiation and chemotherapy, immunotherapy, JRA,neuropathy  are also affecting patient's functional outcome.   REHAB POTENTIAL: Good  CLINICAL DECISION MAKING: Stable/uncomplicated  EVALUATION COMPLEXITY: Low  GOALS: Goals reviewed with patient? Yes  SHORT TERM GOALS: Target date: 07/31/2023  Pt will have decreased left breast pain /swelling by 25% Baseline: 08/19/23: 50% reported  Goal status: MET  2.  Pt will have improved left shoulder ROM flexion by atleast 15 degrees and abd by atleast 20 degrees  Baseline: 105/80; 08/19/23: 140/116 Goal status: MET  3.  Pt will purchase compression bra for control of left breast Lymphedema Baseline:  Goal status: MET  4.  Pt will be independent in a HEP for shoulder ROM and strength and LE strength Baseline:  Goal status: MET   LONG TERM GOALS: Target date: 08/28/2023  Pt will have decreased left breast pain/swelling x 50% Baseline:  Goal status: MET  2.  Pt will have Left shoulder flexion and abd increased to 120 degrees for improved reach Baseline: 08/19/23: Lt shoulder flex 140 and abd 116 degrees Goal status: PARTIALLY MET  3.  Pt will be able to perform sit to stand from regular height chair without use of hands Baseline:  Goal status: MET  4.  Pt will ambulate short household distances with more erect gait and confidence in balance Baseline: 08/19/23 - pt reports balance feels better but legs still feel weak Goal status: PARTIALLY MET  5.  Pt will be independent in left breast MLD Baseline: 08/19/23: Pt is doing well with this but will benefit from further review Goal status: PROGRESSING  6.  Pt will be able to do short periods of shopping pushing a shopping cart instead of using motorized  scooter Baseline:  Goal status: MET 7.  Pt will be able to maintain SLS bilaterally x 10 seconds   Baseline 2-3 seconds   Goal Status: NEW  PLAN:  PT FREQUENCY: 2x/week  PT DURATION: 6 weeks  PLANNED INTERVENTIONS: Therapeutic exercises, Therapeutic activity, Neuromuscular re-education, Balance training, Gait training, Patient/Family education, Self Care, Joint mobilization, Orthotic/Fit training, Manual lymph drainage, Manual therapy, and Re-evaluation  PLAN FOR NEXT SESSION:  RECERT next,cont shoulder AAROM, cont NU step for leg strength, cont LE strength and review and progress HEP; adding bil hip 3 way raises standing at counter Interested in Flexitouch?  Waynette Buttery, PT 08/21/2023, 1:13 PM  Self manual lymph drainage:     [facility] [phone] Perform this sequence once a day.  Only give enough pressure to your skin to make the skin move.  Hug yourself.  Do circles at your neck just above your collarbones.  Repeat this 10 times.  Diaphragmatic - Supine   Inhale through nose making navel move out toward hands. Exhale through puckered lips, hands follow navel in. Repeat _5__ times. Rest _10__ seconds between repeats.   Copyright  VHI. All rights reserved.    Axilla - One at a Time   Using full weight of flat hand and fingers at center of uninvolved armpit, make _10__ in-place circles.   Copyright  VHI. All rights reserved.  LEG: Inguinal Nodes Stimulation   With small finger side of hand against hip crease on involved side, gently perform circles at the crease. Repeat __10_ times.   Copyright  VHI. All rights reserved.  Axilla to Inguinal Nodes - Sweep   On involved side, sweep _4__ times from armpit along side of trunk to hip crease.  Now gently stretch skin from the involved side to the uninvolved side across the chest at the shoulder line.  Repeat that 4 times.  Draw an imaginary diagonal line from upper outer breast through the nipple area toward  lower inner breast.  Direct fluid upward and inward from this line toward the pathway across your upper chest .  Do this in three rows to treat all of the upper inner breast tissue, and do each row 3-4x.      Direct fluid to treat all of lower outer breast tissue downward and outward toward pathway that is aimed at the left groin. Do this laying on your right side.  Finish by doing the pathways as described above going from your involved armpit to the same side groin and going across your upper chest from the involved shoulder to the uninvolved shoulder.  Repeat the steps above where you do circles in your left groin and right armpit.

## 2023-08-26 ENCOUNTER — Ambulatory Visit: Payer: Commercial Managed Care - HMO

## 2023-08-26 ENCOUNTER — Telehealth: Payer: Self-pay | Admitting: *Deleted

## 2023-08-26 DIAGNOSIS — C50412 Malignant neoplasm of upper-outer quadrant of left female breast: Secondary | ICD-10-CM

## 2023-08-26 DIAGNOSIS — Z483 Aftercare following surgery for neoplasm: Secondary | ICD-10-CM

## 2023-08-26 DIAGNOSIS — I890 Lymphedema, not elsewhere classified: Secondary | ICD-10-CM

## 2023-08-26 DIAGNOSIS — M25612 Stiffness of left shoulder, not elsewhere classified: Secondary | ICD-10-CM

## 2023-08-26 DIAGNOSIS — R293 Abnormal posture: Secondary | ICD-10-CM

## 2023-08-26 DIAGNOSIS — M6281 Muscle weakness (generalized): Secondary | ICD-10-CM

## 2023-08-26 DIAGNOSIS — R262 Difficulty in walking, not elsewhere classified: Secondary | ICD-10-CM

## 2023-08-26 NOTE — Telephone Encounter (Signed)
Patient contacted the office and left message stating she does not feel like her medication is working. She is hurting and having knee pain.  Returned call to patient. She states she is having increased pain in her right knee and bilateral leg pain. Patient states she has not had an injury. Patient denies swelling, redness or warmth t her knee. Patient states it is a throbbing pain. Patient states the pain increased since starting the Mobic. Patient states she has trouble walking in the morning.

## 2023-08-26 NOTE — Therapy (Addendum)
OUTPATIENT PHYSICAL THERAPY  ONCOLOGY TREATMENT  Patient Name: Gabrielle [name] MRN: [id number] DOB:1991-06-10, 32 y.o., female Today's Date: 08/26/2023  END OF SESSION:  PT End of Session - 08/26/23 1115     Visit Number 10    Number of Visits 21    Date for PT Re-Evaluation 10/02/23    Authorization Type Medicaid    Authorization Time Period 10/3-11/11/2022    Authorization - Visit Number 2    Authorization - Number of Visits 3    PT Start Time 1109    PT Stop Time 1204    PT Time Calculation (min) 55 min    Activity Tolerance Patient tolerated treatment well    Behavior During Therapy [facility] for tasks assessed/performed              Past Medical History:  Diagnosis Date   Arthritis    Bilateral Knees   Cancer (HCC) 03/11/2022   Breast   Family history of breast cancer 03/21/2022   Family history of prostate cancer 03/21/2022   Hypercholesteremia    Juvenile arthritis (HCC)    Neuropathy    Past Surgical History:  Procedure Laterality Date   AXILLARY SENTINEL NODE BIOPSY Left 05/14/2022   Procedure: LEFT AXILLARY SENTINEL NODE BIOPSY;  Surgeon: [name], MD;  Location: [facility] OR;  Service: General;  Laterality: Left;  GEN w/ PEC BLOCK   BREAST BIOPSY Left 03/11/2022   BREAST LUMPECTOMY WITH AXILLARY LYMPH NODE BIOPSY Left 04/16/2022   Procedure: LEFT BREAST LUMPECTOMY WITH LEFT AXILLARY SENTINEL LYMPH NODE BIOPSY;  Surgeon: [name], MD;  Location: [facility];  Service: General;  Laterality: Left;   DENTAL SURGERY     PORTACATH PLACEMENT Right 04/16/2022   Procedure: INSERTION PORT-A-CATH;  Surgeon: [name], MD;  Location: [facility];  Service: General;  Laterality: Right;   Patient Active Problem List   Diagnosis Date Noted   Other insomnia 08/18/2023   History of juvenile arthritis 04/10/2023   Nausea with vomiting 09/19/2022   Joint pain 09/19/2022   Encounter for antineoplastic chemotherapy 09/19/2022    Peripheral neuropathy 09/19/2022   Hypokalemia 09/19/2022   Port-A-Cath in place 06/14/2022   Genetic testing 03/29/2022   Family history of breast cancer 03/21/2022   Family history of prostate cancer 03/21/2022   Malignant neoplasm of upper-outer quadrant of left breast in female, estrogen receptor negative (HCC) 03/15/2022     REFERRING PROVIDER: Santiago Glad, NP  REFERRING DIAG: Left breast Lymphedema, deconditioning  THERAPY DIAG:  Malignant neoplasm of upper-outer quadrant of left breast in female, estrogen receptor negative (HCC)  Aftercare following surgery for neoplasm  Lymphedema, not elsewhere classified  Muscle weakness (generalized)  Difficulty walking  Abnormal posture  Stiffness of left shoulder, not elsewhere classified  ONSET DATE: 03/06/2023  Rationale for Evaluation and Treatment: Rehabilitation  SUBJECTIVE:  SUBJECTIVE STATEMENT:  My breast is feeling better. My legs are really hurting today, especially the Rt knee. It's been popping a lot lately and that's when it really hurts. The Meloxicam isn't seeming like it's helping this time because my Rt knee and leg is really hurting more. I'm having trouble sleeping it's hurting so much. I don't think I can exercise today because it's a really bad day.   Eval: At night when in the bed her legs ache a lot even with the gabapentin, but mainly at night.  Feet are very painful. She  feels stiff from the knees down. If sitting greater than 5 minutes I feel really stiff. I can't straighten up my back with standing. Breast swelling started after radiation and joint pain started with the Keytruda. Breast has shooting pain intermittently, and she notices firm spots, and enlarged pores. The breast feels heavy but it is not tender. The leg  discomfort makes stairs difficult, getting up from low furniture is hard. Sometimes I need help. I fall a lot because my legs get twisted up. My legs and my feet swell up. Sometimes I walk with a cane.I use a motorized scooter in stores.   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 Attempted to do SLNB but only fibroadipose tissue was removed. Returned to sx for removal of 4 negative LNs 05/14/22 . She had chemotherapy and immunotherapy that was not well tolerated and radiation. Presently having difficulty with neuropathy in her feet, and stiffness pain, especially knees down with trouble standing erect and numerous falls.  PAIN:  Are you having pain?  PAIN:  Are you having pain? Yes NPRS scale: 7/10 Pain location: knee Pain orientation: Right  PAIN TYPE: aching Pain description: constant  Aggravating factors: nothing  Relieving factors: moving it around     PRECAUTIONS: Left UE lymphedema risk, JRA, CIPN, FALL RISK  RED FLAGS: None   WEIGHT BEARING RESTRICTIONS: No, but pt is a significant fall risk  FALLS:  Has patient fallen in last 6 months? Yes. Number of falls 4  LIVING ENVIRONMENT: Lives with:  her sister, her 48 yr old son and sisters friend and daughter, and cousin Lives in: House/apartment Stairs: Yes; External: 20 steps; bilateral but cannot reach both Has following equipment at home: Single point cane and None  OCCUPATION: not working  LEISURE: read, play games, crosswords  HAND DOMINANCE: right   PRIOR LEVEL OF FUNCTION: Independent with household mobility with device  PATIENT GOALS: Decrease breast swelling, Get legs stronger   OBJECTIVE:  COGNITION: Overall cognitive status: Within functional limits for tasks assessed   PALPATION: Fibrosis especially left medial breast mid to lower  OBSERVATIONS / OTHER ASSESSMENTS: hyperpigmentation, Peau d'orange,  fibrosis especially left medial breast superior to inferior,thickened skin  SENSATION: Light touch: Deficits especially feet   POSTURE: significant forward head, rounded shoulders, very flexed posture  UPPER EXTREMITY AROM/PROM:  A/PROM RIGHT   eval   Shoulder extension   Shoulder flexion 135  Shoulder abduction 145  Shoulder internal rotation   Shoulder external rotation     (Blank rows = not tested)  A/PROM LEFT   eval 08/19/23  Shoulder extension    Shoulder flexion 105 140  Shoulder abduction 80 116  Shoulder internal rotation    Shoulder external rotation      (Blank rows = not tested)  UPPER EXTREMITY STRENGTH:  LYMPHEDEMA ASSESSMENTS:  SURGERY TYPE/DATE: Left lumpectomy 04/16/2022, Triple -  NUMBER OF LYMPH NODES REMOVED: 0/4  CHEMOTHERAPY: YES and immunotherapy stopped due to low tolerance RADIATION:YES, 01/16/23-03/06/2023 HORMONE TREATMENT: NO INFECTIONS: NO  LYMPHEDEMA ASSESSMENTS:   LANDMARK RIGHT  eval  At axilla    15 cm proximal to olecranon process   10 cm proximal to olecranon process   Olecranon process   15 cm proximal to ulnar styloid process   10 cm proximal to ulnar styloid process   Just proximal to ulnar styloid process   Across hand at thumb web space   At base of 2nd digit   (Blank rows = not tested)  LANDMARK LEFT  eval  At axilla    15 cm proximal to olecranon process   10 cm proximal to olecranon process   Olecranon process   15 cm proximal to ulnar styloid process   10 cm proximal to ulnar styloid process   Just proximal to ulnar styloid process   Across hand at thumb web space   At base of 2nd digit   (Blank rows = not tested)   FUNCTIONAL TESTS:  Sit to stand;very slow, use of bilateral UE's, flexed posture  08/21/2023  4 POSITION BALANCE TEST; Able to maintain all x 10 sec except SLS  GAIT: Distance walked: slow and difficult rising from chair, slow, very flexed posture Assistive device utilized: no AD device today  but usually uses a cane.  Uses a motorized cart in stores Level of assistance:  Needs to be SBA to min A for safety Comments:   L-DEX LYMPHEDEMA SCREENING: The patient was assessed using the L-Dex machine today to produce a lymphedema index baseline score. The patient will be reassessed on a regular basis (typically every 3 months) to obtain new L-Dex scores. If the score is > 6.5 points away from his/her baseline score indicating onset of subclinical lymphedema, it will be recommended to wear a compression garment for 4 weeks, 12 hours per day and then be reassessed. If the score continues to be > 6.5 points from baseline at reassessment, we will initiate lymphedema treatment. Assessing in this manner has a 95% rate of preventing clinically significant lymphedema.  QUICK DASH SURVEY:  BREAST COMPLAINTS QUESTIONNAIRE Pain:6 Heaviness:5 Swollen feeling:6 Tense Skin:5 Redness:0 Bra Print:6 Size of Pores:7 Hard feeling: 7 Total:   42  /80 A Score over 9 indicates lymphedema issues in the breast   TODAY'S TREATMENT:                                                                                                                                          DATE:  08/26/23: Manual Therapy MLD: Short neck, superficial and deep abdominals, Rt axillary nodes, Rt intact anterior thorax, anterior inter-axillary anastomosis, Lt inguinal nodes, Lt axillo-inguinal anastomosis, then focused on Lt breast redirecting fluid towards anterior anastomosis, next in Rt S/L for Lt lateral breast redirecting towards lateral anastomosis, then finished retracing  all steps in supine STM gently to Lt axilla during P/ROM, pt very tight and tender to touch here where thick scar tissue palpable P/ROM to Lt shoulder into flex, abd, and D2 with scapular depression throughout Therapeutic Exercises NuStep Level 5 x 6 mins seat 11/UE 9, 610 steps  08/21/2023 NuStep Level 5 x 10 mins seat 11/UE 9, 546 steps  4 POSITION BALANCE  TEST; Able to maintain all x 10 sec except SLS Sit to stand from slightly higher table x 10 with emphasis on getting to erect posture Tandem stance B x 30 sec Standing march no hands x 10 ea 3D hip 2# each 2 x 10 with HH Step and hold on airex forward and sideways x 10 ea with intermittent hand hold 08/19/23: Therapeutic Exercises NuStep Level 5 x 10 mins seat 11/UE 9, 712 steps Sit to stand with 5# bell from table and  cushion 2 x 5 reps In // bars: 2# on each ankle to for following: SLS on blue oval for bil hip 3 way raises 2 x 10, second set of 10 increased weight to 3#; VC's during for erect posture and core engagement Manual Therapy MLD: Short neck, superficial and deep abdominals, Rt axillary nodes, Rt intact anterior thorax, anterior inter-axillary anastomosis, Lt inguinal nodes, Lt axillo-inguinal anastomosis, then focused on Lt breast redirecting fluid towards anterior anastomosis, next in Rt S/L for Lt lateral breast redirecting towards lateral anastomosis, then finished retracing all steps in supine STM gently to Lt axilla during P/ROM, pt very tight and tender to touch here where thick scar tissue palpable P/ROM to Lt shoulder into flex, abd, and D2 with scapular depression throughout  08/14/2023 Therapeutic Exercises NuStep Level 5 x 10 mins seat 11/UE 9, 704 steps Sit to stand with 5# bell from table and  cushion 2 x 5 reps In // bars: 2# on each ankle to for following: SLS on blue oval for bil hip 3 way raises 2 x 10  Manual Therapy Focused on MLD and  self MLD for Lt breast: Short neck, 5 diaphragmatic breaths, Rt axillary nodes, Lt inguinal nodes, anterior inter-axillary and Lt axillo-inguinal anastomosis, then focused on Lt breast redirecting fluid towards anterior anastomosis. Needing cueing to work on pathway direction, flat hand,stretch vs sliding, and direction  08/05/23 Manual Therapy Focused on self MLD for Lt breast: Short neck, 5 diaphragmatic breaths, Rt axillary  nodes, Lt inguinal nodes, anterior inter-axillary and Lt axillo-inguinal anastomosis, then focused on Lt breast redirecting fluid towards anterior anastomosis. Needing cueing to work on pathway direction, stretch vs sliding, and breast anatomy and direction but overall did well. Gave note about swell spot for appt with [facility] to [facility] today.   Therapeutic Exercises NuStep Level 5 x 10 mins seat 11/UE 9, 715 steps Seated on red wobble cushion TrA and postural focus with slow march x 10 bil, LAQ x 10 bil Sit to stand with 5# bell from 2 cushions with vcs for nose over toes and to bring feet underneath her a bit more In // bars: 2# on each ankle to for following: SLS on blue oval for bil hip 3 way raises 2 x 10     PATIENT EDUCATION:  Education details: Bil LE strength in varying positions and HS stretch in sitting Person educated: Patient Education method: Explanation and Handouts, demonstration and VC's Education comprehension: verbalized understanding, returned demonstration, VC's and pt will benefit from further review  HOME EXERCISE PROGRAM: 07/10/23: Bil LE strength and flexibility, see below  ASSESSMENT:  CLINICAL IMPRESSION: Today pt comes in reporting increased Rt knee pain and does not feel she can tolerate weight bearing activities. Focused on Mld to Lt breast and desensitization of Lt axilla with MFR. Her end Lt shoulder P/ROM did improve some by end of session after stretching. Then did NuStep for some ROM to help work on decreasing stiffness of Lt knee. Pt reports this didn't increase her Rt knee pain. She reports has been very compliant with her HEP exs, her Rt knee is just hurting a lot more today and plans on calling her doctor to discuss possible medication change as he instructed her to do.   OBJECTIVE IMPAIRMENTS: Abnormal gait, decreased activity tolerance, decreased balance, decreased endurance, decreased knowledge of condition, decreased mobility, difficulty walking,  decreased ROM, decreased strength, increased edema, impaired sensation, impaired UE functional use, postural dysfunction, and pain.   ACTIVITY LIMITATIONS: carrying, bending, standing, squatting, sleeping, bathing, reach over head, and locomotion level  PARTICIPATION LIMITATIONS: meal prep, cleaning, laundry, community activity, and occupation  PERSONAL FACTORS: 3+ comorbidities: Triple Negative Breast Cancer s/p radiation and chemotherapy, immunotherapy, JRA,neuropathy  are also affecting patient's functional outcome.   REHAB POTENTIAL: Good  CLINICAL DECISION MAKING: Stable/uncomplicated  EVALUATION COMPLEXITY: Low  GOALS: Goals reviewed with patient? Yes  SHORT TERM GOALS: Target date: 07/31/2023  Pt will have decreased left breast pain /swelling by 25% Baseline: 08/19/23: 50% reported  Goal status: MET  2.  Pt will have improved left shoulder ROM flexion by atleast 15 degrees and abd by atleast 20 degrees  Baseline: 105/80; 08/19/23: 140/116 Goal status: MET  3.  Pt will purchase compression bra for control of left breast Lymphedema Baseline:  Goal status: MET  4.  Pt will be independent in a HEP for shoulder ROM and strength and LE strength Baseline:  Goal status: MET   LONG TERM GOALS: Target date: 08/28/2023  Pt will have decreased left breast pain/swelling x 50% Baseline:  Goal status: MET  2.  Pt will have Left shoulder flexion and abd increased to 120 degrees for improved reach Baseline: 08/19/23: Lt shoulder flex 140 and abd 116 degrees Goal status: PARTIALLY MET  3.  Pt will be able to perform sit to stand from regular height chair without use of hands Baseline:  Goal status: MET  4.  Pt will ambulate short household distances with more erect gait and confidence in balance Baseline: 08/19/23 - pt reports balance feels better but legs still feel weak Goal status: PARTIALLY MET  5.  Pt will be independent in left breast MLD Baseline: 08/19/23: Pt is doing  well with this but will benefit from further review Goal status:MET  6.  Pt will be able to do short periods of shopping pushing a shopping cart instead of using motorized scooter Baseline:  Goal status: MET  7.  Pt will be able to maintain SLS bilaterally x 10 seconds   Baseline 2-3 seconds   Goal Status: NOT TESTED  PLAN:  PT FREQUENCY: 2x/week  PT DURATION: 6 weeks  PLANNED INTERVENTIONS: Therapeutic exercises, Therapeutic activity, Neuromuscular re-education, Balance training, Gait training, Patient/Family education, Self Care, Joint mobilization, Orthotic/Fit training, Manual lymph drainage, Manual therapy, and Re-evaluation  PLAN FOR NEXT SESSION:  Pt has one more approved session so will be D/C next. Did she call rheumatologist? Cont shoulder AAROM, cont NU step for leg strength, cont LE strength and review and progress HEP and breast lymphedema MLD Interested in Flexitouch?  Hermenia Bers, PTA 08/26/2023,  12:24 PM  Self manual lymph drainage:     Cancer Re[facility] [phone] Perform this sequence once a day.  Only give enough pressure to your skin to make the skin move.  Hug yourself.  Do circles at your neck just above your collarbones.  Repeat this 10 times.  Diaphragmatic - Supine   Inhale through nose making navel move out toward hands. Exhale through puckered lips, hands follow navel in. Repeat _5__ times. Rest _10__ seconds between repeats.   Copyright  VHI. All rights reserved.    Axilla - One at a Time   Using full weight of flat hand and fingers at center of uninvolved armpit, make _10__ in-place circles.   Copyright  VHI. All rights reserved.  LEG: Inguinal Nodes Stimulation   With small finger side of hand against hip crease on involved side, gently perform circles at the crease. Repeat __10_ times.   Copyright  VHI. All rights reserved.  Axilla to Inguinal Nodes - Sweep   On involved side, sweep _4__ times from armpit along side of  trunk to hip crease.  Now gently stretch skin from the involved side to the uninvolved side across the chest at the shoulder line.  Repeat that 4 times.  Draw an imaginary diagonal line from upper outer breast through the nipple area toward lower inner breast.  Direct fluid upward and inward from this line toward the pathway across your upper chest .  Do this in three rows to treat all of the upper inner breast tissue, and do each row 3-4x.      Direct fluid to treat all of lower outer breast tissue downward and outward toward pathway that is aimed at the left groin. Do this laying on your right side.  Finish by doing the pathways as described above going from your involved armpit to the same side groin and going across your upper chest from the involved shoulder to the uninvolved shoulder.  Repeat the steps above where you do circles in your left groin and right armpit.   PHYSICAL THERAPY DISCHARGE SUMMARY  Visits from Start of Care: 10  Current functional level related to goals / functional outcomes: Pt had trouble with leg pain limiting PT and exercise.  She did have improvements in her breast status and most goals related to this were met.  Completed 2, cancelled 2 and no showed x 1 during last approved plan.    Remaining deficits: Pain and weakness   Education / Equipment: Final instruction  Plan: Patient agrees to discharge.  Patient goals were mostly met.    Gwenevere Abbot, PT

## 2023-08-27 ENCOUNTER — Encounter: Payer: Self-pay | Admitting: Hematology

## 2023-08-28 ENCOUNTER — Ambulatory Visit: Payer: Commercial Managed Care - HMO

## 2023-08-28 NOTE — Progress Notes (Deleted)
Office Visit Note  Patient: Gabrielle [name]             Date of Birth: [birth year]           MRN: [id number]             PCP: Norm Salt, PA Referring: Norm Salt, [place] Visit Date: 09/04/2023   Subjective:  No chief complaint on file.   History of Present Illness: Gabrielle [name] is a 32 y.o. female here for follow up for ongoing joint pain and intermittent swelling with history of JRA and of pembrolizumab treatment.    Previous HPI 08/18/2023 Gabrielle [name] is a 32 y.o. female here for follow up for ongoing joint pain and intermittent swelling with history of JRA and of pembrolizumab treatment. After last visit she noticed a moderate improvement with taking meloxicam once daily. She ran out of the medication and experienced increase in symptoms again. She had an exacerbation of pain in her legs worst issue is the lower leg pain at night this also makes sleeping difficult. We increased gabapentin to 300 mg BID and this is partially helpful. Still notices mild swelling in both knees, worse on right side. Does not have as much leg pain or numbness while standing or walking.   Previous HPI 05/12/2023 Gabrielle [name] is a 32 y.o. female here for follow up for ongoing joint pain concern for possible inflammatory arthritis.  Lab testing at initial visit did show a very mildly elevated sedimentation rate of 25.  Otherwise was unremarkable for specific antibodies.  X-ray of the knee showed some considerable calcification at the patellar ligament insertion tibial tuberosity.  Still has ongoing knee pain worse on the right side and gets worse with prolonged use especially certain movements getting up from a long duration seated getting up from the floor or climbing stairs.  Gets visible swelling in the feet and ankles usually develops by the end of the day and is improved in the morning.   Previous HPI 04/10/23 Gabrielle [name] is a 32 y.o. female here for evaluation of joint pain especially  knees. She has a history of previous JRA but was apparently never on long term DMARD treatment mostly symptoms controlled with NSAIDs. Had been resolved without chronic joint inflammation and no surgery required. She was diagnosed with breast cancer last year and underwent lumpectomy and adjuvant chemotherapy regimen including paclitaxel, carboplatin, and [place] starting July last year. She had side effects with treatment including neuropathy in hands and feet started treatment with gabapentin for this. Also started to develop increased joint pain at multiple areas but particularly knees. She completed last infusion in November then completed radiation treatment in April. Many side effects and much of her overall body aches improved with still having knee pain. Does not see much visible swelling. Morning stiffness for a few minutes also has trouble getting up from sitting, worse after prolonged time in one position. Still has some pain in hands and feet but also sometimes prickly type sensation partially improved with gabapentin.   No Rheumatology ROS completed.   PMFS History:  Patient Active Problem List   Diagnosis Date Noted   Other insomnia 08/18/2023   History of juvenile arthritis 04/10/2023   Nausea with vomiting 09/19/2022   Joint pain 09/19/2022   Encounter for antineoplastic chemotherapy 09/19/2022   Peripheral neuropathy 09/19/2022   Hypokalemia 09/19/2022   Port-A-Cath in place 06/14/2022   Genetic testing 03/29/2022   Family history of  breast cancer 03/21/2022   Family history of prostate cancer 03/21/2022   Malignant neoplasm of upper-outer quadrant of left breast in female, estrogen receptor negative (HCC) 03/15/2022    Past Medical History:  Diagnosis Date   Arthritis    Bilateral Knees   Cancer (HCC) 03/11/2022   Breast   Family history of breast cancer 03/21/2022   Family history of prostate cancer 03/21/2022   Hypercholesteremia    Juvenile arthritis (HCC)     Neuropathy     Family History  Problem Relation Age of Onset   Breast cancer Mother 65   Prostate cancer Father 81   Diabetes Brother    Breast cancer Paternal Grandmother        dx after 63   Prostate cancer Paternal Grandfather        dx 90s; metastatic   ADD / ADHD Son    Breast cancer Maternal Aunt 72   Breast cancer Paternal Aunt        dx unknown age   Prostate cancer Paternal Uncle        dx after 77   Cystic fibrosis Niece    Past Surgical History:  Procedure Laterality Date   AXILLARY SENTINEL NODE BIOPSY Left 05/14/2022   Procedure: LEFT AXILLARY SENTINEL NODE BIOPSY;  Surgeon: [name], MD;  Location: [facility] OR;  Service: General;  Laterality: Left;  GEN w/ PEC BLOCK   BREAST BIOPSY Left 03/11/2022   BREAST LUMPECTOMY WITH AXILLARY LYMPH NODE BIOPSY Left 04/16/2022   Procedure: LEFT BREAST LUMPECTOMY WITH LEFT AXILLARY SENTINEL LYMPH NODE BIOPSY;  Surgeon: [name], MD;  Location: [facility];  Service: General;  Laterality: Left;   DENTAL SURGERY     PORTACATH PLACEMENT Right 04/16/2022   Procedure: INSERTION PORT-A-CATH;  Surgeon: [name], MD;  Location: [facility];  Service: General;  Laterality: Right;   Social History   Social History Narrative   Not on file   Immunization History  Administered Date(s) Administered   Tdap 12/19/2016     Objective: Vital Signs: LMP 07/23/2023    Physical Exam   Musculoskeletal Exam: ***  CDAI Exam: CDAI Score: -- Patient Global: --; Provider Global: -- Swollen: --; Tender: -- Joint Exam 09/04/2023   No joint exam has been documented for this visit   There is currently no information documented on the homunculus. Go to the Rheumatology activity and complete the homunculus joint exam.  Investigation: No additional findings.  Imaging: No results found.  Recent Labs: Lab Results  Component Value Date   WBC 4.5 06/19/2023   HGB 12.4 06/19/2023   PLT 262  06/19/2023   NA 138 06/19/2023   K 3.7 06/19/2023   CL 105 06/19/2023   CO2 27 06/19/2023   GLUCOSE 82 06/19/2023   BUN 14 06/19/2023   CREATININE 0.84 06/19/2023   BILITOT 0.6 06/19/2023   ALKPHOS 99 06/19/2023   AST 25 06/19/2023   ALT 12 06/19/2023   PROT 6.8 06/19/2023   ALBUMIN 4.1 06/19/2023   CALCIUM 10.4 (H) 06/19/2023    Speciality Comments: No specialty comments available.  Procedures:  No procedures performed Allergies: Patient has no known allergies.   Assessment / Plan:     Visit Diagnoses: No diagnosis found.  ***  Orders: No orders of the defined types were placed in this encounter.  No orders of the defined types were placed in this encounter.    Follow-Up Instructions: No follow-ups on file.   Elycia Woodside P  Philander Ake, RT  Note - This record has been created using [employer].  Chart creation errors have been sought, but may not always  have been located. Such creation errors do not reflect on  the standard of medical care.

## 2023-08-28 NOTE — Telephone Encounter (Signed)
If she feels like pain got worse since starting the meloxicam she could try going back to using ibuprofen or Aleve instead if these were more beneficial.  Otherwise I think we need to make a new follow-up appointment to take another look at her knee and could discuss other medication options.

## 2023-08-28 NOTE — Telephone Encounter (Signed)
Patient advised if she feels like pain got worse since starting the meloxicam she could try going back to using ibuprofen or Aleve instead if these were more beneficial. Otherwise Dr. Dimple Casey thinks we need to make a new follow-up appointment to take another look at her knee and could discuss other medication options. Patient scheduled a follow up visit on 09/04/2023 at 11:20 am.

## 2023-09-01 ENCOUNTER — Telehealth: Payer: Self-pay

## 2023-09-01 NOTE — Telephone Encounter (Signed)
Spoke with pt to inform pt that Santiago Glad, NP ordered a MRI for the pt to complete no later than 09/18/2023.  Pt stated she's aware that [facility] ordered for her to have a MRI but does not want to get contrast due to the side effects she's experienced from it in the past.  Informed pt that the MRI is w or w/o contrast.  Informed pt that [facility] did not order a Mammogram d/t to the swelling in the pt's breast; therefore, [facility]ie ordered a MRI for comfort for the pt.  Stated that pt really needs to have MRI done so pt's breast could be further evaluated.  Pt verbalized understanding and stated she would contact [facility] to schedule her appt.

## 2023-09-03 ENCOUNTER — Ambulatory Visit: Payer: Commercial Managed Care - HMO

## 2023-09-04 ENCOUNTER — Ambulatory Visit: Payer: Managed Care, Other (non HMO) | Admitting: Internal Medicine

## 2023-09-04 DIAGNOSIS — G6289 Other specified polyneuropathies: Secondary | ICD-10-CM

## 2023-09-04 DIAGNOSIS — G8929 Other chronic pain: Secondary | ICD-10-CM

## 2023-09-04 DIAGNOSIS — G4709 Other insomnia: Secondary | ICD-10-CM

## 2023-09-04 DIAGNOSIS — Z79899 Other long term (current) drug therapy: Secondary | ICD-10-CM

## 2023-09-09 ENCOUNTER — Ambulatory Visit: Payer: Commercial Managed Care - HMO | Admitting: Rehabilitation

## 2023-09-14 NOTE — Progress Notes (Unsigned)
Patient Care Team: Norm Salt, PA as PCP - General (Physician Assistant) [name], RN as Oncology Nurse Navigator [name], RN as Oncology Nurse Navigator [name], MD as Consulting Physician (General Surgery) Malachy Mood, MD as Consulting Physician (Hematology) Dorothy Puffer, MD as Consulting Physician (Radiation Oncology) Ranee Gosselin, MD as Consulting Physician (Orthopedic Surgery) Raymondo Band as Physician Assistant (Hematology and Oncology) Imaging, [facility] as Radiologist (Diagnostic Radiology)   CHIEF COMPLAINT: Follow up left breast cancer    Oncology History Overview Note   Cancer Staging  Malignant neoplasm of upper-outer quadrant of left breast in female, estrogen receptor negative (HCC) Staging form: Breast, AJCC 8th Edition - Clinical stage from 03/11/2022: Stage IIB (cT2, cN0, cM0, G3, ER-, PR-, HER2-) - Signed by Malachy Mood, MD on 03/19/2022    Malignant neoplasm of upper-outer quadrant of left breast in female, estrogen receptor negative (HCC)  03/08/2022 Mammogram   CLINICAL DATA:  32 year old female presenting for evaluation of a palpable lump in the left breast which she feels has increased in size since she first identified it. Her mother was diagnosed with breast cancer within the last 6 months. She also has family history of breast cancer in multiple aunts and her maternal grandmother.   EXAM: DIGITAL DIAGNOSTIC BILATERAL MAMMOGRAM WITH TOMOSYNTHESIS AND CAD; ULTRASOUND LEFT BREAST LIMITED  IMPRESSION: 1. There is a suspicious 3.7 cm mass in the left breast at 12 o'clock.   2.  No evidence of left axillary lymphadenopathy.   3.  No evidence of malignancy in the right breast.   03/11/2022 Cancer Staging   Staging form: Breast, AJCC 8th Edition - Clinical stage from 03/11/2022: Stage IIB (cT2, cN0, cM0, G3, ER-, PR-, HER2-) - Signed by Malachy Mood, MD on 03/19/2022 Stage prefix: Initial  diagnosis Histologic grading system: 3 grade system   03/11/2022 Initial Biopsy   Diagnosis Breast, left, needle core biopsy, 12:00 - METAPLASTIC CARCINOMA - SEE COMMENT  Microscopic Comment The biopsy has an invasive epithelial component admixed with chondroid deposition, consistent with a metaplastic carcinoma. Based on the biopsy, the carcinoma appears Nottingham grade 3 of 3 and measures 1.2 cm in greatest linear extent.  PROGNOSTIC INDICATORS Results: The tumor cells are NEGATIVE for Her2 (0). Estrogen Receptor: 0%, NEGATIVE Progesterone Receptor: 0%, NEGATIVE Proliferation Marker Ki67: 40%   03/15/2022 Initial Diagnosis   Malignant neoplasm of upper-outer quadrant of left breast in female, estrogen receptor negative (HCC)   03/28/2022 Genetic Testing   Negative hereditary cancer genetic testing: no pathogenic variants detected in [facility] BRCAPlus Panel or [employer] CustomNext-Cancer +RNAinsight Panel.  Report dates are 03/28/2022 and 03/31/2022. Marland Kitchen   The BRCAplus panel offered by [employer] and includes sequencing and deletion/duplication analysis for the following 8 genes: ATM, BRCA1, BRCA2, CDH1, CHEK2, PALB2, PTEN, and TP53.  The CustomNext-Cancer+RNAinsight panel offered by [employer] includes sequencing and rearrangement analysis for the following 47 genes:  APC, ATM, AXIN2, BARD1, BMPR1A, BRCA1, BRCA2, BRIP1, CDH1, CDK4, CDKN2A, CHEK2, DICER1, EPCAM, GREM1, HOXB13, MEN1, MLH1, MSH2, MSH3, MSH6, MUTYH, NBN, NF1, NF2, NTHL1, PALB2, PMS2, POLD1, POLE, PTEN, RAD51C, RAD51D, RECQL, RET, SDHA, SDHAF2, SDHB, SDHC, SDHD, SMAD4, SMARCA4, STK11, TP53, TSC1, TSC2, and VHL.  RNA data is routinely analyzed for use in variant interpretation for all genes.   05/14/2022 Cancer Staging   Staging form: Breast, AJCC 8th Edition - Pathologic stage from 05/14/2022: Stage IIA (pT2, pN0, cM0, G3, ER-, PR-, HER2-) - Signed by Mosetta Putt,  Terrace Arabia, MD on 05/28/2022 Stage prefix: Initial diagnosis Histologic  grading system: 3 grade system Residual tumor (R): R0 - None   06/13/2022 -  Chemotherapy   Patient is on Treatment Plan : BREAST Pembrolizumab (200) D1 + Carboplatin (1.5) D1,8,15 + Paclitaxel (80) D1,8,15 q21d X 4 cycles / Pembrolizumab (200) D1 + AC D1 q21d x 4 cycles     06/14/2022 - 07/05/2022 Chemotherapy   Patient is on Treatment Plan : BREAST Pembrolizumab (200) D1 + Carboplatin (5) D1 + Paclitaxel (80) D1,8,15 q21d X 4 cycles / Pembrolizumab (200) D1 + AC D1 q21d x 4 cycles        CURRENT THERAPY: Surveillance   INTERVAL HISTORY Gabrielle [name] returns for follow up as scheduled. Last seen by me 06/19/23. She was reluctant to due mammogram due to breast swelling and tenderness, she agreed to MRI but has not had that done, and completed PT for lymphedema.   ROS   Past Medical History:  Diagnosis Date  . Arthritis    Bilateral Knees  . Cancer (HCC) 03/11/2022   Breast  . Family history of breast cancer 03/21/2022  . Family history of prostate cancer 03/21/2022  . Hypercholesteremia   . Juvenile arthritis (HCC)   . Neuropathy      Past Surgical History:  Procedure Laterality Date  . AXILLARY SENTINEL NODE BIOPSY Left 05/14/2022   Procedure: LEFT AXILLARY SENTINEL NODE BIOPSY;  Surgeon: [name], MD;  Location: [facility] OR;  Service: General;  Laterality: Left;  GEN w/ PEC BLOCK  . BREAST BIOPSY Left 03/11/2022  . BREAST LUMPECTOMY WITH AXILLARY LYMPH NODE BIOPSY Left 04/16/2022   Procedure: LEFT BREAST LUMPECTOMY WITH LEFT AXILLARY SENTINEL LYMPH NODE BIOPSY;  Surgeon: [name], MD;  Location: [facility];  Service: General;  Laterality: Left;  . DENTAL SURGERY    . PORTACATH PLACEMENT Right 04/16/2022   Procedure: INSERTION PORT-A-CATH;  Surgeon: [name], MD;  Location: [facility];  Service: General;  Laterality: Right;     Outpatient Encounter Medications as of 09/18/2023  Medication Sig  . calcium-vitamin D (OSCAL WITH D)  500-5 MG-MCG tablet Take 1 tablet by mouth 2 (two) times daily for 7 days.  Marland Kitchen gabapentin (NEURONTIN) 300 MG capsule Take 1 capsule (300 mg total) by mouth in the morning and at bedtime.  . meloxicam (MOBIC) 15 MG tablet Take 1 tablet (15 mg total) by mouth daily.   Facility-Administered Encounter Medications as of 09/18/2023  Medication  . diphenhydrAMINE (BENADRYL) 50 MG/ML injection  . famotidine (PEPCID) 20-0.9 MG/50ML-% IVPB     There were no vitals filed for this visit. There is no height or weight on file to calculate BMI.   PHYSICAL EXAM GENERAL:alert, no distress and comfortable SKIN: no rash  EYES: sclera clear NECK: without mass LYMPH:  no palpable cervical or supraclavicular lymphadenopathy  LUNGS: clear with normal breathing effort HEART: regular rate & rhythm, no lower extremity edema ABDOMEN: abdomen soft, non-tender and normal bowel sounds NEURO: alert & oriented x 3 with fluent speech, no focal motor/sensory deficits Breast exam:  PAC without erythema    CBC    Component Value Date/Time   WBC 4.5 06/19/2023 1045   RBC 4.52 06/19/2023 1045   HGB 12.4 06/19/2023 1045   HGB 9.5 (L) 10/24/2022 1114   HCT 36.6 06/19/2023 1045   PLT 262 06/19/2023 1045   PLT 300 10/24/2022 1114   MCV 81.0 06/19/2023 1045   MCH 27.4 06/19/2023 1045  MCHC 33.9 06/19/2023 1045   RDW 13.3 06/19/2023 1045   LYMPHSABS 2.2 06/19/2023 1045   MONOABS 0.4 06/19/2023 1045   EOSABS 0.2 06/19/2023 1045   BASOSABS 0.0 06/19/2023 1045     CMP     Component Value Date/Time   NA 138 06/19/2023 1045   K 3.7 06/19/2023 1045   CL 105 06/19/2023 1045   CO2 27 06/19/2023 1045   GLUCOSE 82 06/19/2023 1045   BUN 14 06/19/2023 1045   CREATININE 0.84 06/19/2023 1045   CREATININE 0.68 04/10/2023 1021   CALCIUM 10.4 (H) 06/19/2023 1045   CALCIUM NSERUM 10/24/2022 1114   PROT 6.8 06/19/2023 1045   ALBUMIN 4.1 06/19/2023 1045   AST 25 06/19/2023 1045   AST 28 10/24/2022 1114   ALT 12  06/19/2023 1045   ALT 19 10/24/2022 1114   ALKPHOS 99 06/19/2023 1045   BILITOT 0.6 06/19/2023 1045   BILITOT 0.4 10/24/2022 1114   GFRNONAA >60 06/19/2023 1045   GFRNONAA >60 10/24/2022 1114     ASSESSMENT & PLAN:Gabrielle [name] is a 32 y.o. 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 SLN biopsy on 05/14/22 was negative (0/4). -she started zoladex on 06/03/22. -she began chemotherapy with carboplatin/taxol and Keytruda on 06/14/22. Taxol switched to abraxane with C2D8 due to itching and mild numbness. 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 -she switched to [facility] with continuing Keytruda on 09/19/22. She tolerated poorly with persistent N/V, fatigue and anorexia, so treatment was stopped after one cycle -She subsequently developed diffuse arthralgia which has significantly impacted her daily activities, this could be related to immunotherapy Keytruda.  It was stopped. Last dose AC/keytruda 09/19/22 -Due to her persistent fatigue, low appetite and weight loss, we recommended restaging with bone scan/CT CAP, bone scan was negative 01/09/23; she opted not to do the CT CAP -S/p radiation 01/16/23 - 03/06/2023, she developed skin toxicity and moist desquamation -Gabrielle [name] appears improved from last visit.  N/V resolved.  She gained weight after the last visit but has lost some, she attributes to drinking more water and eating healthier. -Breast exam shows marked lymphedema with peau d'orange appearance likely from RT;  no discrete mass. She is 4 months out from RT. I have referred her to PT.  -She is overdue for mammogram but does not plan to proceed due to  breast pain and concern hat it will be painful. She agrees with breast MRI -I will call her with results.  -IF negative continue surveillance -F/up in 3-4 months, or sooner if needed -She does not want [place] to use her port, due to pain with access; I will message Dr. Dwain Sarna to discuss removal   2.  H/o JRA, polyneuropathy, diffuse arthralgia and a dry mouth -diffuse body pain started after last cycle AC/keytruda, her arthralgia has significantly impacted her daily activities.  -Possibly related to [facility], which we stopped 09/2022 -diffuse joint pain nearly resolved, except mild knee pain. She still has lower extremity pain from knee down, with foot pain, stiffness, and mobility limitations.  -She is trying again for disability, which I support, given this profound impact on her strength and mobility. I will refer to SW -I also  offered PT to help with conditioning and strength, she agrees -Seeing Dr. Dimple Casey with rheumatology. Currently on mobic; next f/up 07/2023      PLAN:  No orders of the defined types were placed in this encounter.     All questions were answered. The patient knows to call the clinic with any problems, questions or concerns. No barriers to learning were detected. I spent *** counseling the patient face to face. The total time spent in the appointment was *** and more than 50% was on counseling, review of test results, and coordination of care.   Santiago Glad, NP-C @DATE @

## 2023-09-16 ENCOUNTER — Encounter: Payer: Self-pay | Admitting: Neurology

## 2023-09-16 ENCOUNTER — Ambulatory Visit (INDEPENDENT_AMBULATORY_CARE_PROVIDER_SITE_OTHER): Payer: Managed Care, Other (non HMO) | Admitting: Neurology

## 2023-09-16 VITALS — BP 96/64 | HR 61 | Ht 69.25 in | Wt 188.0 lb

## 2023-09-16 DIAGNOSIS — G479 Sleep disorder, unspecified: Secondary | ICD-10-CM

## 2023-09-16 DIAGNOSIS — E663 Overweight: Secondary | ICD-10-CM

## 2023-09-16 DIAGNOSIS — Z9189 Other specified personal risk factors, not elsewhere classified: Secondary | ICD-10-CM | POA: Diagnosis not present

## 2023-09-16 DIAGNOSIS — G4700 Insomnia, unspecified: Secondary | ICD-10-CM

## 2023-09-16 DIAGNOSIS — R5383 Other fatigue: Secondary | ICD-10-CM

## 2023-09-16 NOTE — Patient Instructions (Addendum)
Thank you for choosing [facility] for your sleep related care! It was nice to meet you today!   Here is what we discussed today:    You can try Melatonin at night for sleep: take 1 mg to 3 mg, one to 2 hours before your bedtime. You can go up to 5 mg if needed. It is over the counter and comes in pill form, chewable form and spray, if you prefer.     Based on your symptoms and your exam I believe you are at risk for obstructive sleep apnea (aka OSA). We should proceed with a sleep study to determine whether you do or do not have OSA and how severe it is. Even, if you have mild OSA, I may want you to consider treatment with CPAP, as treatment of even borderline or mild sleep apnea can result and improvement of symptoms such as sleep disruption, daytime sleepiness, nighttime bathroom breaks, restless leg symptoms, improvement of headache syndromes, even improved mood disorder.   As explained, an attended sleep study (meaning you get to stay overnight in the sleep lab), lets [place] monitor sleep-related behaviors such as sleep talking and leg movements in sleep, in addition to monitoring for sleep apnea.  A home sleep test is a screening tool for sleep apnea diagnosis only, but unfortunately, does not help with any other sleep-related diagnoses.  Please remember, the long-term risks and ramifications of untreated moderate to severe obstructive sleep apnea may include (but are not limited to): increased risk for cardiovascular disease, including congestive heart failure, stroke, difficult to control hypertension, treatment resistant obesity, arrhythmias, especially irregular heartbeat commonly known as A. Fib. (atrial fibrillation); even type 2 diabetes has been linked to untreated OSA.   Other correlations that untreated obstructive sleep apnea include macular edema which is swelling of the retina in the eyes, droopy eyelid syndrome, and elevated hemoglobin and hematocrit levels (often  referred to as polycythemia).  Sleep apnea can cause disruption of sleep and sleep deprivation in most cases, which, in turn, can cause recurrent headaches, problems with memory, mood, concentration, focus, and vigilance. Most people with untreated sleep apnea report excessive daytime sleepiness, which can affect their ability to drive. Please do not drive or use heavy equipment or machinery, if you feel sleepy! Patients with sleep apnea can also develop difficulty initiating and maintaining sleep (aka insomnia).   Having sleep apnea may increase your risk for other sleep disorders, including involuntary behaviors sleep such as sleep terrors, sleep talking, sleepwalking.    Having sleep apnea can also increase your risk for restless leg syndrome and leg movements at night.   Please note that untreated obstructive sleep apnea may carry additional perioperative morbidity. Patients with significant obstructive sleep apnea (typically, in the moderate to severe degree) should receive, if possible, perioperative PAP (positive airway pressure) therapy and the surgeons and particularly the anesthesiologists should be informed of the diagnosis and the severity of the sleep disordered breathing.   We will call you or email you through [facility] with regards to your test results and plan a follow-up in sleep clinic accordingly. Most likely, you will hear from one of our nurses.   Our sleep lab administrative assistant will call you to schedule your sleep study and give you further instructions, regarding the check in process for the sleep study, arrival time, what to bring, when you can expect to leave after the study, etc., and to answer any other logistical questions you may have. If you don't  hear back from her by about 2 weeks from now, please feel free to call her direct line at [phone] or you can call our general clinic number, or email [place] through My Chart.

## 2023-09-16 NOTE — Progress Notes (Signed)
Subjective:    Patient ID: [name] [name] a 32 y.o. female.  HPI    Gabrielle Foley, MD, PhD [facility] [address], [address] [address] [place], [place] [address]  Dear Dr. Dimple [name],  I saw your patient, Gabrielle [name], upon your kind request in my sleep clinic today for initial consultation of her sleep disorder, in particular, concern for underlying obstructive sleep apnea.  The patient [name] unaccompanied today.  As you know, Gabrielle [name] a 31 year old female with an underlying medical history of rheumatoid arthritis, breast cancer with status post left lumpectomy, status post chemotherapy and radiation therapy, hyperlipidemia, neuropathy, and overweight state, who reports difficulty sleeping at night, including difficulty falling asleep and staying asleep.  She reports that her difficulty sleeping started after she was diagnosed with breast cancer earlier in 2023.  Her Epworth sleepiness score [name] 0 out of 24, fatigue severity score [name] 49 out of 63 she [name] currently taking Mobic for her arthritis and gabapentin 300 mg twice daily. I reviewed your office note from 08/18/2023.  She [name] not aware of any snoring.  She lives with her sister, sister's significant other and sister's daughter and her own son who [name] 19 years old.  She also has a 23 year old son who lives with his father. Bedtime [name] generally around 830 and rise time around 6:30 AM.  She does not fall asleep easily, has tried Tylenol PM but it made her groggy the next day.  She has not tried melatonin yet.  She does not drink caffeine daily.  She does not currently drink any alcohol.  She quit smoking in April 2023.  She denies nightly nocturia or recurrent nocturnal or morning headaches.  They have 1 dog in the household, the dog does not sleep in her bedroom.  She reports discomfort in her legs at night.  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  He reports no disturbances  including primarily anxiety, not actual depressive mood but mood irritability at times and fluctuation in symptoms, denies suicidal ideation, has not seen her PCP for this, in fact, she has not seen a PCP in years.  Her Past Medical History [name] Significant For: Past Medical History:  Diagnosis Date   Arthritis    Bilateral Knees   Cancer (HCC) 03/11/2022   Breast   Family history of breast cancer 03/21/2022   Family history of prostate cancer 03/21/2022   Hypercholesteremia    Juvenile arthritis (HCC)    Neuropathy     Her Past Surgical History [name] Significant For: Past Surgical History:  Procedure Laterality Date   AXILLARY SENTINEL NODE BIOPSY Left 05/14/2022   Procedure: LEFT AXILLARY SENTINEL NODE BIOPSY;  Surgeon: Gabrielle Loron, MD;  Location: [facility] OR;  Service: General;  Laterality: Left;  GEN w/ PEC BLOCK   BREAST BIOPSY Left 03/11/2022   BREAST LUMPECTOMY WITH AXILLARY LYMPH NODE BIOPSY Left 04/16/2022   Procedure: LEFT BREAST LUMPECTOMY WITH LEFT AXILLARY SENTINEL LYMPH NODE BIOPSY;  Surgeon: Gabrielle Loron, MD;  Location: [facility];  Service: General;  Laterality: Left;   DENTAL SURGERY     PORTACATH PLACEMENT Right 04/16/2022   Procedure: INSERTION PORT-A-CATH;  Surgeon: Gabrielle Loron, MD;  Location: [facility];  Service: General;  Laterality: Right;    Her Family History [name] Significant For: Family History  Problem Relation Age of Onset   Breast cancer Mother 51   Other Mother  sleeping problems   Prostate cancer Father 27   Diabetes Brother    Breast cancer Paternal Grandmother        dx after 51   Prostate cancer Paternal Grandfather        dx 12s; metastatic   ADD / ADHD Son    Breast cancer Maternal Aunt 52   Breast cancer Paternal Aunt        dx unknown age   Prostate cancer Paternal Uncle        dx after 52   Cystic fibrosis Niece    Sleep apnea Neg Hx     Her Social History [name] Significant For: Social  History   Socioeconomic History   Marital status: Single    Spouse name: Not on file   Number of children: 2   Years of education: Not on file   Highest education level: Not on file  Occupational History   Not on file  Tobacco Use   Smoking status: Former    Current packs/day: 0.00    Average packs/day: 0.3 packs/day for 3.0 years (0.8 ttl pk-yrs)    Types: Cigarettes    Start date: 01/17/2019    Quit date: 01/16/2022    Years since quitting: 1.6    Passive exposure: Past   Smokeless tobacco: Never  Vaping Use   Vaping status: Never Used  Substance and Sexual Activity   Alcohol use: Not Currently    Comment: social   Drug use: Not Currently    Types: Marijuana   Sexual activity: Yes    Birth control/protection: None  Other Topics Concern   Not on file  Social History Narrative   Right handed   Caffeine: "not really"   Social Determinants of Health   Financial Resource Strain: High Risk (03/20/2022)   Overall Financial Resource Strain (CARDIA)    Difficulty of Paying Living Expenses: Very hard  Food Insecurity: Food Insecurity Present (03/20/2022)   Hunger Vital Sign    Worried About Running Out of Food in the Last Year: Often true    Ran Out of Food in the Last Year: Sometimes true  Transportation Needs: Unmet Transportation Needs (03/20/2022)   PRAPARE - Administrator, Civil Service (Medical): Yes    Lack of Transportation (Non-Medical): No  Physical Activity: Insufficiently Active (09/23/2018)   Received from [facility], [facility]   Exercise Vital Sign    Days of Exercise per Week: 2 days    Minutes of Exercise per Session: 30 min  Stress: Not on file  Social Connections: Not on file    Her Allergies Are:  No Known Allergies:   Her Current Medications Are:  Outpatient Encounter Medications as of 09/16/2023  Medication Sig   gabapentin (NEURONTIN) 300 MG capsule Take 1 capsule (300 mg total) by mouth in the morning and at bedtime.    meloxicam (MOBIC) 15 MG tablet Take 1 tablet (15 mg total) by mouth daily.   [DISCONTINUED] calcium-vitamin D (OSCAL WITH D) 500-5 MG-MCG tablet Take 1 tablet by mouth 2 (two) times daily for 7 days.   Facility-Administered Encounter Medications as of 09/16/2023  Medication   diphenhydrAMINE (BENADRYL) 50 MG/ML injection   famotidine (PEPCID) 20-0.9 MG/50ML-% IVPB  :   Review of Systems:  Out of a complete 14 point review of systems, all are reviewed and negative with the exception of these symptoms as listed below:    Review of Systems  Neurological:  Patient [name] here alone for a sleep consult. She reports she has trouble sleeping at night. She can fall asleep but staying asleep [name] a problem. She states sometimes she doesn't sleep but will just lay there with her eyes closed. She [name] very tired during the day but states she doesn't fall asleep/doze. She has had mood changes due to loss of sleep. She has had trouble with sleeping since she was diagnosed with breast cancer last spring. She states her issues got worse with chemo. She started getting neuropathy and her legs ache all night long. She [name] seeing a rheumatologist. She [name] currently in remission. Her mother also has problems sleeping. ESS 0 FSS 49    Objective:  Neurological Exam  Physical Exam Physical Examination:   Vitals:   09/16/23 1103  BP: 96/64  Pulse: 61    General Examination: The patient [name] a very pleasant 32 y.o. female in no acute distress. She appears well-developed and well-nourished and well groomed.   HEENT: Normocephalic, atraumatic, pupils are equal, round and reactive to light, extraocular tracking [name] good without limitation to gaze excursion or nystagmus noted. Hearing [name] grossly intact. Face [name] symmetric with normal facial animation. Speech [name] clear with no dysarthria noted. There [name] no hypophonia. There [name] no lip, neck/head, jaw or voice tremor. Neck [name] supple with full range of passive and active  motion. There are no carotid bruits on auscultation. Oropharynx exam reveals: mild mouth dryness, adequate dental hygiene and mild airway crowding, due to small airway entry, Mallampati class II, tonsils about 1+ bilaterally.  Neck circumference 13 7/8 inches, minimal overbite noted.  Tongue protrudes centrally and palate elevates symmetrically.  Chest: Clear to auscultation without wheezing, rhonchi or crackles noted.  Heart: S1+S2+0, regular and normal without murmurs, rubs or gallops noted.   Abdomen: Soft, non-tender and non-distended.  Extremities: There [name] no pitting edema in the distal lower extremities bilaterally.   Skin: Warm and dry without trophic changes noted.   Musculoskeletal: exam reveals no obvious joint deformities.   Neurologically:  Mental status: The patient [name] awake, alert and oriented in all 4 spheres. Her immediate and remote memory, attention, language skills and fund of knowledge are appropriate. There [name] no evidence of aphasia, agnosia, apraxia or anomia. Speech [name] clear with normal prosody and enunciation. Thought process [name] linear. Mood [name] normal and affect [name] normal.  Cranial nerves II - XII are as described above under HEENT exam.  Motor exam: Normal bulk, strength and tone [name] noted. There [name] no obvious action or resting tremor.  Fine motor skills and coordination: grossly intact.  Cerebellar testing: No dysmetria or intention tremor. There [name] no truncal or gait ataxia.  Sensory exam: intact to light touch in the upper and lower extremities.  Gait, station and balance: She stands with mild difficulty and usually has a limp, does not have a walking aid.   Assessment and Plan:  In summary, Gabrielle [name] [name] a very pleasant 32 y.o.-year old female with an underlying medical history of rheumatoid arthritis, breast cancer with status post left lumpectomy, status post chemotherapy and radiation therapy, hyperlipidemia, neuropathy, and overweight state, who presents  for evaluation of her sleep disturbance including chronic difficulty initiating and maintaining sleep, daytime tiredness.  She [name] advised to proceed with a home sleep test to look into the possibility of an Sleep disordered breathing.  She [name] encouraged to try melatonin at night for sleep, starting with 1 mg strength about an hour or  2 before bedtime, she can gradually go up if needed to up to 5 mg.  She [name] encouraged to establish with her PCP to talk about her mood irritability and anxiety symptoms which may cause her to have difficulty sleeping at night.  She reports discomfort in her legs for which she [name] taking gabapentin as well as Mobic for her arthritis.  She [name] advised to try to maintain a healthy lifestyle and good sleep habits and good sleep hygiene.   I also talked to the patient about obstructive sleep apnea, its prognosis and treatment options. We talked about medical/conservative treatments, surgical interventions and non-pharmacological approaches for symptom control. I explained, in particular, the risks and ramifications of untreated moderate to severe OSA, especially with respect to developing cardiovascular disease down the road, including congestive heart failure (CHF), difficult to treat hypertension, cardiac arrhythmias (particularly A-fib), neurovascular complications including TIA, stroke and dementia. Even type 2 diabetes has, in part, been linked to untreated OSA. Symptoms of untreated OSA may include (but may not be limited to) daytime sleepiness, nocturia (i.e. frequent nighttime urination), memory problems, mood irritability and suboptimally controlled or worsening mood disorder such as depression and/or anxiety, lack of energy, lack of motivation, physical discomfort, as well as recurrent headaches, especially morning or nocturnal headaches.  I recommended a sleep study at this time. I outlined the differences between a laboratory attended sleep study which [name] considered more  comprehensive and accurate over the option of a home sleep test (HST); the latter may lead to underestimation of sleep disordered breathing in some instances and does not help with diagnosing upper airway resistance syndrome and [name] not accurate enough to diagnose primary central sleep apnea typically. I outlined possible surgical and non-surgical treatment options of OSA, including the use of a positive airway pressure (PAP) device (i.e. CPAP, AutoPAP/APAP or BiPAP in certain circumstances), a custom-made dental device (aka oral appliance, which would require a referral to a specialist dentist or orthodontist typically, and [name] generally speaking not considered for patients with full dentures or edentulous state), upper airway surgical options, such as traditional UPPP (which [name] not considered a first-line treatment) or the Inspire device (hypoglossal nerve stimulator, which would involve a referral for consultation with an ENT surgeon, after careful selection, following inclusion criteria - also not first-line treatment). I explained the PAP treatment option to the patient in detail, as this [name] generally considered first-line treatment.  The patient indicated that she would be willing to try PAP therapy, if the need arises. I explained the importance of being compliant with PAP treatment, not only for insurance purposes but primarily to improve patient's symptoms symptoms, and for the patient's long term health benefit, including to reduce Her cardiovascular risks longer-term.    We will pick up our discussion about the next steps and treatment options after testing.  We will keep her posted as to the test results by phone call and/or MyChart messaging where possible.  We will plan to follow-up in sleep clinic accordingly as well.  I answered all her questions today and the patient was in agreement.   I encouraged her to call with any interim questions, concerns, problems or updates or email [place] through MyChart.   Generally speaking, sleep test authorizations may take up to 2 weeks, sometimes less, sometimes longer, the patient [name] encouraged to get in touch with [place] if they do not hear back from the sleep lab staff directly within the next 2 weeks.  Thank you very much  for allowing me to participate in the care of this nice patient. If I can be of any further assistance to you please do not hesitate to call me at [phone].  Sincerely,   Gabrielle Foley, MD, PhD

## 2023-09-18 ENCOUNTER — Other Ambulatory Visit: Payer: Self-pay

## 2023-09-18 ENCOUNTER — Encounter: Payer: Self-pay | Admitting: Nurse Practitioner

## 2023-09-18 ENCOUNTER — Inpatient Hospital Stay: Payer: Managed Care, Other (non HMO) | Attending: Hematology

## 2023-09-18 ENCOUNTER — Inpatient Hospital Stay (HOSPITAL_BASED_OUTPATIENT_CLINIC_OR_DEPARTMENT_OTHER): Payer: Managed Care, Other (non HMO) | Admitting: Nurse Practitioner

## 2023-09-18 ENCOUNTER — Telehealth: Payer: Self-pay

## 2023-09-18 VITALS — BP 109/82 | HR 69 | Temp 98.2°F | Resp 20

## 2023-09-18 DIAGNOSIS — I890 Lymphedema, not elsewhere classified: Secondary | ICD-10-CM | POA: Diagnosis not present

## 2023-09-18 DIAGNOSIS — Z79899 Other long term (current) drug therapy: Secondary | ICD-10-CM | POA: Insufficient documentation

## 2023-09-18 DIAGNOSIS — R112 Nausea with vomiting, unspecified: Secondary | ICD-10-CM | POA: Diagnosis not present

## 2023-09-18 DIAGNOSIS — Z171 Estrogen receptor negative status [ER-]: Secondary | ICD-10-CM | POA: Diagnosis not present

## 2023-09-18 DIAGNOSIS — Z9221 Personal history of antineoplastic chemotherapy: Secondary | ICD-10-CM | POA: Diagnosis not present

## 2023-09-18 DIAGNOSIS — G479 Sleep disorder, unspecified: Secondary | ICD-10-CM | POA: Insufficient documentation

## 2023-09-18 DIAGNOSIS — R5383 Other fatigue: Secondary | ICD-10-CM | POA: Diagnosis not present

## 2023-09-18 DIAGNOSIS — C50412 Malignant neoplasm of upper-outer quadrant of left female breast: Secondary | ICD-10-CM

## 2023-09-18 DIAGNOSIS — Z803 Family history of malignant neoplasm of breast: Secondary | ICD-10-CM | POA: Diagnosis not present

## 2023-09-18 DIAGNOSIS — R630 Anorexia: Secondary | ICD-10-CM | POA: Diagnosis not present

## 2023-09-18 DIAGNOSIS — Z8042 Family history of malignant neoplasm of prostate: Secondary | ICD-10-CM | POA: Insufficient documentation

## 2023-09-18 DIAGNOSIS — M2550 Pain in unspecified joint: Secondary | ICD-10-CM | POA: Diagnosis not present

## 2023-09-18 DIAGNOSIS — G629 Polyneuropathy, unspecified: Secondary | ICD-10-CM | POA: Diagnosis not present

## 2023-09-18 LAB — COMPREHENSIVE METABOLIC PANEL
ALT: 9 U/L (ref 0–44)
AST: 23 U/L (ref 15–41)
Albumin: 3.9 g/dL (ref 3.5–5.0)
Alkaline Phosphatase: 96 U/L (ref 38–126)
Anion gap: 4 — ABNORMAL LOW (ref 5–15)
BUN: 10 mg/dL (ref 6–20)
CO2: 28 mmol/L (ref 22–32)
Calcium: 10.2 mg/dL (ref 8.9–10.3)
Chloride: 106 mmol/L (ref 98–111)
Creatinine, Ser: 0.73 mg/dL (ref 0.44–1.00)
GFR, Estimated: >60 mL/min (ref >60)
Glucose, Bld: 82 mg/dL (ref 70–99)
Potassium: 3.7 mmol/L (ref 3.5–5.1)
Sodium: 138 mmol/L (ref 135–145)
Total Bilirubin: 0.5 mg/dL (ref 0.3–1.2)
Total Protein: 6.6 g/dL (ref 6.5–8.1)

## 2023-09-18 LAB — CBC WITH DIFFERENTIAL/PLATELET
Abs Immature Granulocytes: 0.01 10*3/uL (ref 0.00–0.07)
Basophils Absolute: 0 10*3/uL (ref 0.0–0.1)
Basophils Relative: 0 %
Eosinophils Absolute: 0.2 10*3/uL (ref 0.0–0.5)
Eosinophils Relative: 3 %
HCT: 36.8 % (ref 36.0–46.0)
Hemoglobin: 12.2 g/dL (ref 12.0–15.0)
Immature Granulocytes: 0 %
Lymphocytes Relative: 53 %
Lymphs Abs: 2.7 10*3/uL (ref 0.7–4.0)
MCH: 26.9 pg (ref 26.0–34.0)
MCHC: 33.2 g/dL (ref 30.0–36.0)
MCV: 81.2 fL (ref 80.0–100.0)
Monocytes Absolute: 0.4 10*3/uL (ref 0.1–1.0)
Monocytes Relative: 9 %
Neutro Abs: 1.8 10*3/uL (ref 1.7–7.7)
Neutrophils Relative %: 35 %
Platelets: 217 10*3/uL (ref 150–400)
RBC: 4.53 MIL/uL (ref 3.87–5.11)
RDW: 14 % (ref 11.5–15.5)
WBC: 5.1 10*3/uL (ref 4.0–10.5)
nRBC: 0 % (ref 0.0–0.2)

## 2023-09-18 NOTE — Telephone Encounter (Signed)
Orders was routed to [facility] to get pt scheduled appt.

## 2023-09-19 LAB — T4: T4, Total: 8 ug/dL (ref 4.5–12.0)

## 2023-09-24 ENCOUNTER — Telehealth: Payer: Self-pay | Admitting: Neurology

## 2023-09-24 NOTE — Telephone Encounter (Signed)
HST [employer] no auth req/[employer] no auth req

## 2023-09-30 ENCOUNTER — Encounter: Payer: Self-pay | Admitting: Hematology

## 2023-10-02 ENCOUNTER — Other Ambulatory Visit: Payer: Self-pay | Admitting: Nurse Practitioner

## 2023-10-02 DIAGNOSIS — Z171 Estrogen receptor negative status [ER-]: Secondary | ICD-10-CM

## 2023-10-14 ENCOUNTER — Encounter: Payer: Self-pay | Admitting: Hematology

## 2023-10-16 ENCOUNTER — Encounter: Payer: Self-pay | Admitting: Hematology

## 2023-10-17 ENCOUNTER — Encounter: Payer: Self-pay | Admitting: Hematology

## 2023-10-22 ENCOUNTER — Ambulatory Visit: Payer: Managed Care, Other (non HMO) | Attending: Nurse Practitioner | Admitting: Physical Therapy

## 2023-10-22 NOTE — Therapy (Incomplete)
OUTPATIENT PHYSICAL THERAPY  UPPER EXTREMITY ONCOLOGY EVALUATION  Patient Name: Gabrielle [name] MRN: [id number] DOB:October 20, 1991, 32 y.o., female Today's Date: 10/22/2023  END OF SESSION:   Past Medical History:  Diagnosis Date   Arthritis    Bilateral Knees   Cancer (HCC) 03/11/2022   Breast   Family history of breast cancer 03/21/2022   Family history of prostate cancer 03/21/2022   Hypercholesteremia    Juvenile arthritis (HCC)    Neuropathy    Past Surgical History:  Procedure Laterality Date   AXILLARY SENTINEL NODE BIOPSY Left 05/14/2022   Procedure: LEFT AXILLARY SENTINEL NODE BIOPSY;  Surgeon: [name], MD;  Location: [facility] OR;  Service: General;  Laterality: Left;  GEN w/ PEC BLOCK   BREAST BIOPSY Left 03/11/2022   BREAST LUMPECTOMY WITH AXILLARY LYMPH NODE BIOPSY Left 04/16/2022   Procedure: LEFT BREAST LUMPECTOMY WITH LEFT AXILLARY SENTINEL LYMPH NODE BIOPSY;  Surgeon: [name], MD;  Location: [facility];  Service: General;  Laterality: Left;   DENTAL SURGERY     PORTACATH PLACEMENT Right 04/16/2022   Procedure: INSERTION PORT-A-CATH;  Surgeon: [name], MD;  Location:  SURGERY CENTER;  Service: General;  Laterality: Right;   Patient Active Problem List   Diagnosis Date Noted   Other insomnia 08/18/2023   History of juvenile arthritis 04/10/2023   Nausea with vomiting 09/19/2022   Joint pain 09/19/2022   Encounter for antineoplastic chemotherapy 09/19/2022   Peripheral neuropathy 09/19/2022   Hypokalemia 09/19/2022   Port-A-Cath in place 06/14/2022   Genetic testing 03/29/2022   Family history of breast cancer 03/21/2022   Family history of prostate cancer 03/21/2022   Malignant neoplasm of upper-outer quadrant of left breast in female, estrogen receptor negative (HCC) 03/15/2022    PCP: ***  REFERRING PROVIDER: Pollyann Samples, NP  REFERRING DIAG: C50.412,Z17.1 (ICD-10-CM) - Malignant neoplasm of upper-outer  quadrant of left breast in female, estrogen receptor negative (HCC)  THERAPY DIAG:  No diagnosis found.  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 Attempted to do SLNB but only fibroadipose tissue was removed. Returned to sx for removal of 4 negative LNs 05/14/22 . She had chemotherapy and immunotherapy that was not well tolerated and radiation. Presently having difficulty with neuropathy in  her feet, and stiffness pain, especially knees down with trouble standing erect and numerous falls.   PAIN:  Are you having pain? {yes/no:20286} NPRS scale: ***/10 Pain location: *** Pain orientation: {Pain Orientation:25161}  PAIN TYPE: {type:313116} Pain description: {PAIN DESCRIPTION:21022940}  Aggravating factors: *** Relieving factors: ***  PRECAUTIONS: L UE lymphedema risk, JRA, CIPN  RED FLAGS: {PT Red Flags:29287}   WEIGHT BEARING RESTRICTIONS: {Yes ***/No:24003}  FALLS:  Has patient fallen in last 6 months? {fallsyesno:27318}  LIVING ENVIRONMENT: Lives with:  her sister, her 7 yr old son and sisters friend and daughter, and cousin Lives in: House/apartment Stairs: Yes; External: 20 steps; bilateral but cannot reach both Has following equipment at home: Single point cane and None  OCCUPATION: ***  LEISURE: ***  HAND DOMINANCE: {RIGHT/LEFT:21944}   PRIOR LEVEL OF FUNCTION: {PLOF:24004}  PATIENT GOALS: ***   OBJECTIVE: Note: Objective measures were completed at Evaluation unless otherwise noted.  COGNITION: Overall cognitive status:  {cognition:24006}   PALPATION: ***  OBSERVATIONS / OTHER ASSESSMENTS: ***  SENSATION: Light touch: {intact/deficits:24005} Stereognosis: {intact/deficits:24005} Hot/Cold: {intact/deficits:24005} Proprioception: {intact/deficits:24005}  POSTURE: ***  UPPER EXTREMITY AROM/PROM:  A/PROM RIGHT   eval   Shoulder extension   Shoulder flexion   Shoulder abduction   Shoulder internal rotation   Shoulder external rotation     (Blank rows = not tested)  A/PROM LEFT   eval  Shoulder extension   Shoulder flexion   Shoulder abduction   Shoulder internal rotation   Shoulder external rotation     (Blank rows = not tested)  CERVICAL AROM: All within normal limits:    Percent limited  Flexion   Extension   Right lateral flexion   Left lateral flexion   Right rotation   Left rotation     UPPER EXTREMITY STRENGTH:     LYMPHEDEMA ASSESSMENTS:   SURGERY TYPE/DATE: Left lumpectomy 04/16/2022, Triple -  NUMBER OF LYMPH NODES REMOVED: 0/4   CHEMOTHERAPY: YES and immunotherapy stopped due to low tolerance  RADIATION:YES, 01/16/23-03/06/2023  HORMONE TREATMENT: NO  INFECTIONS: NO   LYMPHEDEMA ASSESSMENTS:   LANDMARK RIGHT  eval  At axilla    15 cm proximal to olecranon process   10 cm proximal to olecranon process   Olecranon process   15 cm proximal to ulnar styloid process   10 cm proximal to ulnar styloid process   Just proximal to ulnar styloid process   Across hand at thumb web space   At base of 2nd digit   (Blank rows = not tested)  LANDMARK LEFT  eval  At axilla    15 cm proximal to olecranon process   10 cm proximal to olecranon process   Olecranon process   15 cm proximal to ulnar styloid process   10 cm proximal to ulnar styloid process   Just proximal to ulnar styloid process   Across hand at thumb web space   At base of 2nd digit   (Blank rows = not tested)   FUNCTIONAL TESTS:  {Functional tests:24029}  GAIT: Distance walked:  *** Assistive device utilized: {Assistive devices:23999} Level of assistance: {Levels of assistance:24026} Comments: ***  QUICK DASH SURVEY: ***   TODAY'S TREATMENT:  DATE: ***    PATIENT EDUCATION:  Education details: *** Person educated: {Person educated:25204} Education method: {Education Method:25205} Education comprehension: {Education Comprehension:25206}  HOME EXERCISE PROGRAM: ***  ASSESSMENT:  CLINICAL IMPRESSION: Patient is a *** y.o. *** who was seen today for physical therapy evaluation and treatment for ***.    OBJECTIVE IMPAIRMENTS: {opptimpairments:25111}.   ACTIVITY LIMITATIONS: {activitylimitations:27494}  PARTICIPATION LIMITATIONS: {participationrestrictions:25113}  PERSONAL FACTORS: {Personal factors:25162} are also affecting patient's functional outcome.   REHAB POTENTIAL: {rehabpotential:25112}  CLINICAL DECISION MAKING: {clinical decision making:25114}  EVALUATION COMPLEXITY: {Evaluation complexity:25115}  GOALS: Goals reviewed with patient? {yes/no:20286}  SHORT TERM GOALS: Target date: ***  *** Baseline: Goal status: INITIAL  2.  *** Baseline:  Goal status: INITIAL  3.  *** Baseline:  Goal status: INITIAL  4.  *** Baseline:  Goal status: INITIAL  5.  *** Baseline:  Goal status: INITIAL  6.  *** Baseline:  Goal status: INITIAL  LONG TERM GOALS: Target date: ***  *** Baseline:  Goal status: INITIAL  2.  *** Baseline:  Goal status: INITIAL  3.  *** Baseline:  Goal status: INITIAL  4.  *** Baseline:  Goal status: INITIAL  5.  *** Baseline:  Goal status: INITIAL  6.  *** Baseline:  Goal status: INITIAL  PLAN:  PT FREQUENCY: {rehab frequency:25116}  PT DURATION: {rehab duration:25117}  PLANNED INTERVENTIONS: {rehab planned interventions:25118::"Patient/Family  education","Balance training","Joint mobilization","Therapeutic exercises","Therapeutic activity","Neuromuscular re-education","Gait training","Self Care"}  PLAN FOR NEXT SESSION: ***  Leonette Most, PT 10/22/2023, 8:58 AM

## 2023-10-30 ENCOUNTER — Inpatient Hospital Stay (HOSPITAL_BASED_OUTPATIENT_CLINIC_OR_DEPARTMENT_OTHER): Payer: Commercial Managed Care - HMO | Admitting: Hematology

## 2023-10-30 ENCOUNTER — Encounter: Payer: Self-pay | Admitting: Hematology

## 2023-10-30 ENCOUNTER — Inpatient Hospital Stay: Payer: Commercial Managed Care - HMO | Attending: Hematology

## 2023-10-30 ENCOUNTER — Other Ambulatory Visit: Payer: Self-pay

## 2023-10-30 VITALS — BP 102/68 | HR 68 | Temp 97.0°F | Resp 16 | Wt 176.4 lb

## 2023-10-30 DIAGNOSIS — I890 Lymphedema, not elsewhere classified: Secondary | ICD-10-CM | POA: Diagnosis not present

## 2023-10-30 DIAGNOSIS — G4700 Insomnia, unspecified: Secondary | ICD-10-CM | POA: Diagnosis not present

## 2023-10-30 DIAGNOSIS — M1990 Unspecified osteoarthritis, unspecified site: Secondary | ICD-10-CM | POA: Diagnosis not present

## 2023-10-30 DIAGNOSIS — Z171 Estrogen receptor negative status [ER-]: Secondary | ICD-10-CM

## 2023-10-30 DIAGNOSIS — Z9221 Personal history of antineoplastic chemotherapy: Secondary | ICD-10-CM | POA: Diagnosis not present

## 2023-10-30 DIAGNOSIS — C50412 Malignant neoplasm of upper-outer quadrant of left female breast: Secondary | ICD-10-CM | POA: Insufficient documentation

## 2023-10-30 DIAGNOSIS — Z923 Personal history of irradiation: Secondary | ICD-10-CM | POA: Diagnosis not present

## 2023-10-30 DIAGNOSIS — Z1722 Progesterone receptor negative status: Secondary | ICD-10-CM | POA: Insufficient documentation

## 2023-10-30 DIAGNOSIS — G629 Polyneuropathy, unspecified: Secondary | ICD-10-CM | POA: Diagnosis not present

## 2023-10-30 DIAGNOSIS — R634 Abnormal weight loss: Secondary | ICD-10-CM | POA: Insufficient documentation

## 2023-10-30 DIAGNOSIS — Z1732 Human epidermal growth factor receptor 2 negative status: Secondary | ICD-10-CM | POA: Diagnosis not present

## 2023-10-30 LAB — CBC WITH DIFFERENTIAL/PLATELET
Abs Immature Granulocytes: 0.01 10*3/uL (ref 0.00–0.07)
Basophils Absolute: 0 10*3/uL (ref 0.0–0.1)
Basophils Relative: 0 %
Eosinophils Absolute: 0.3 10*3/uL (ref 0.0–0.5)
Eosinophils Relative: 5 %
HCT: 35 % — ABNORMAL LOW (ref 36.0–46.0)
Hemoglobin: 11.5 g/dL — ABNORMAL LOW (ref 12.0–15.0)
Immature Granulocytes: 0 %
Lymphocytes Relative: 50 %
Lymphs Abs: 2.6 10*3/uL (ref 0.7–4.0)
MCH: 26.7 pg (ref 26.0–34.0)
MCHC: 32.9 g/dL (ref 30.0–36.0)
MCV: 81.4 fL (ref 80.0–100.0)
Monocytes Absolute: 0.5 10*3/uL (ref 0.1–1.0)
Monocytes Relative: 9 %
Neutro Abs: 1.9 10*3/uL (ref 1.7–7.7)
Neutrophils Relative %: 36 %
Platelets: 224 10*3/uL (ref 150–400)
RBC: 4.3 MIL/uL (ref 3.87–5.11)
RDW: 14 % (ref 11.5–15.5)
WBC: 5.2 10*3/uL (ref 4.0–10.5)
nRBC: 0 % (ref 0.0–0.2)

## 2023-10-30 LAB — COMPREHENSIVE METABOLIC PANEL
ALT: 8 U/L (ref 0–44)
AST: 21 U/L (ref 15–41)
Albumin: 3.9 g/dL (ref 3.5–5.0)
Alkaline Phosphatase: 95 U/L (ref 38–126)
Anion gap: 4 — ABNORMAL LOW (ref 5–15)
BUN: 11 mg/dL (ref 6–20)
CO2: 27 mmol/L (ref 22–32)
Calcium: 10.2 mg/dL (ref 8.9–10.3)
Chloride: 107 mmol/L (ref 98–111)
Creatinine, Ser: 0.74 mg/dL (ref 0.44–1.00)
GFR, Estimated: >60 mL/min (ref >60)
Glucose, Bld: 79 mg/dL (ref 70–99)
Potassium: 3.4 mmol/L — ABNORMAL LOW (ref 3.5–5.1)
Sodium: 138 mmol/L (ref 135–145)
Total Bilirubin: 0.4 mg/dL (ref <1.2)
Total Protein: 6.8 g/dL (ref 6.5–8.1)

## 2023-10-30 NOTE — Assessment & Plan Note (Addendum)
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 SLN biopsy on 05/14/22 was negative (0/4). -she started zoladex on 06/03/22. -she began chemotherapy with carboplatin/taxol and Keytruda on 06/14/22. Taxol switched to abraxane with C2D8 due to itching and mild numbness. 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 -she switched to [facility] with continuing Keytruda on 09/19/22. She tolerated poorly with persistent N/V, fatigue and anorexia, so treatment was stopped after one cycle, I encourage her to continue Keytruda for one year but she declined  -S/p radiation 01/16/23 - 03/06/2023,

## 2023-10-30 NOTE — Progress Notes (Signed)
[facility]   Telephone:([phone]) [phone] [phone]   Clinic Follow up Note   Patient Care Team: Patient, [name] as PCP - General (General Practice) [name], RN as Oncology Nurse Navigator [name], RN as Oncology Nurse Navigator [name], MD as Consulting Physician (General Surgery) Malachy Mood, MD as Consulting Physician (Hematology) Dorothy Puffer, MD as Consulting Physician (Radiation Oncology) Ranee Gosselin, MD as Consulting Physician (Orthopedic Surgery) Raymondo Band as Physician Assistant (Hematology and Oncology) Imaging, [facility] as Radiologist (Diagnostic Radiology)  Date of Service:  10/30/2023  CHIEF COMPLAINT: f/u of breast cancer  CURRENT THERAPY:  Cancer surveillance  Oncology History   Malignant neoplasm of upper-outer quadrant of left breast in female, 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 SLN biopsy on 05/14/22 was negative (0/4). -she started zoladex on 06/03/22. -she began chemotherapy with carboplatin/taxol and Keytruda on 06/14/22. Taxol switched to abraxane with C2D8 due to itching and mild numbness. 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 -she switched to [facility] with continuing Keytruda on 09/19/22. She tolerated poorly with persistent N/V, fatigue and anorexia, so treatment was stopped after one cycle, I encourage her to continue Keytruda for one year but she declined  -S/p radiation 01/16/23 - 03/06/2023,    Assessment and Plan    Breast Cancer Follow-Up 32-year-old female with significant weight loss (188 lbs to 176 lbs), left breast pain, swelling, and a lump above the incision site. No recent mammogram  due to pain concerns. High-risk aggressive cancer history. Emphasized the necessity of a diagnostic mammogram despite discomfort for early detection. Discussed whole body CT scan to rule out metastasis due to weight loss. - Order diagnostic mammogram and ultrasound of the left breast - Order whole body CT scan to rule out metastasis - Refer to physical therapy for lymphedema management - Flush port - Discuss with Dr. Dimple Casey about potential use of low-dose steroids for pain management  Arthritis Leg and foot pain since Keytruda, possible related. This is impacting mobility and sleep. Currently on meloxicam and gabapentin. Discussed potential short-term use of low-dose steroids for pain management, noting long-term risks. - Discuss with Dr. Dimple Casey about potential use of low-dose steroids - Continue meloxicam and gabapentin - Refer to physical therapy for pain management  Insomnia Difficulty sleeping, likely secondary to neuropathy-related leg pain, which worsens at night. Discussed potential use of low-dose steroids for pain management to improve sleep quality. - Discuss with Dr. Dimple Casey about potential use of low-dose steroids for pain management - Continue current pain management regimen  General Health Maintenance Needs routine cancer surveillance. Discussed the importance of regular mammograms for early detection of recurrence or new cancer. - Order diagnostic mammogram and ultrasound of the left breast - Order whole body CT scan  Plan - Schedule CT scan and diagnostic mammogram within 2-3 weeks - Call patient after results are available - Refer to physical therapy for left breast lymphedema management.  -I will reach out to Dr. Bernie Covey to see if he agrees to use low-dose steroids for her significant joint pain      SUMMARY OF ONCOLOGIC HISTORY: Oncology History Overview Note   Cancer Staging  Malignant neoplasm of upper-outer quadrant of left breast in female, estrogen  receptor negative  (HCC) Staging form: Breast, AJCC 8th Edition - Clinical stage from 03/11/2022: Stage IIB (cT2, cN0, cM0, G3, ER-, PR-, HER2-) - Signed by Malachy Mood, MD on 03/19/2022    Malignant neoplasm of upper-outer quadrant of left breast in female, estrogen receptor negative (HCC)  03/08/2022 Mammogram   CLINICAL DATA:  32 year old female presenting for evaluation of a palpable lump in the left breast which she feels has increased in size since she first identified it. Her mother was diagnosed with breast cancer within the last 6 months. She also has family history of breast cancer in multiple aunts and her maternal grandmother.   EXAM: DIGITAL DIAGNOSTIC BILATERAL MAMMOGRAM WITH TOMOSYNTHESIS AND CAD; ULTRASOUND LEFT BREAST LIMITED  IMPRESSION: 1. There is a suspicious 3.7 cm mass in the left breast at 12 o'clock.   2.  No evidence of left axillary lymphadenopathy.   3.  No evidence of malignancy in the right breast.   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 Based on the biopsy, the carcinoma appears Nottingham grade 3 of 3 and measures 1.2 cm in greatest linear extent.  PROGNOSTIC INDICATORS Results: The tumor cells are NEGATIVE for Her2 (0). Estrogen Receptor: 0%, NEGATIVE Progesterone Receptor: 0%, NEGATIVE Proliferation Marker Ki67: 40%   03/15/2022 Initial Diagnosis   Malignant neoplasm of upper-outer quadrant of left breast in female, estrogen receptor negative (HCC)   03/28/2022 Genetic Testing    Negative hereditary cancer genetic testing: no pathogenic variants detected in [employer] BRCAPlus Panel or [employer] CustomNext-Cancer +RNAinsight Panel.  Report dates are 03/28/2022 and 03/31/2022. Marland Kitchen   The BRCAplus panel offered by [employer] and includes sequencing and deletion/duplication analysis for the following 8 genes: ATM, BRCA1, BRCA2, CDH1, CHEK2, PALB2, PTEN, and TP53.  The CustomNext-Cancer+RNAinsight panel offered by [employer] includes sequencing and rearrangement analysis for the following 47 genes:  APC, ATM, AXIN2, BARD1, BMPR1A, BRCA1, BRCA2, BRIP1, CDH1, CDK4, CDKN2A, CHEK2, DICER1, EPCAM, GREM1, HOXB13, MEN1, MLH1, MSH2, MSH3, MSH6, MUTYH, NBN, NF1, NF2, NTHL1, PALB2, PMS2, POLD1, POLE, PTEN, RAD51C, RAD51D, RECQL, RET, SDHA, SDHAF2, SDHB, SDHC, SDHD, SMAD4, SMARCA4, STK11, TP53, TSC1, TSC2, and VHL.  RNA data is routinely analyzed for use in variant interpretation for all genes.   05/14/2022 Cancer Staging   Staging form: Breast, AJCC 8th Edition - Pathologic stage from 05/14/2022: Stage IIA (pT2, pN0, cM0, G3, ER-, PR-, HER2-) - Signed by Malachy Mood, MD on 05/28/2022 Stage prefix: Initial diagnosis Histologic grading system: 3 grade system Residual tumor (R): R0 - None   06/13/2022 -  Chemotherapy   Patient is on Treatment Plan : BREAST Pembrolizumab (200) D1 + Carboplatin (1.5) D1,8,15 + Paclitaxel (80) D1,8,15 q21d X 4 cycles / Pembrolizumab (200) D1 + AC D1 q21d x 4 cycles     06/14/2022 - 07/05/2022 Chemotherapy   Patient is on Treatment Plan : BREAST Pembrolizumab (200) D1 + Carboplatin (5) D1 + Paclitaxel (80) D1,8,15 q21d X 4 cycles / Pembrolizumab (200) D1 + AC D1 q21d x 4 cycles        Discussed the  use of AI scribe software for clinical note transcription with the patient, who gave verbal consent to proceed.  History of Present Illness   The patient, a three-year survivor of breast cancer, presents with significant weight loss, despite reporting regular meals. She notes  that she has been eating more vegetables and fruits and less fried food. She reports persistent pain in her legs and feet, which she attributes to rheumatoid arthritis. The pain is severe enough to affect her mobility and sleep. She is currently on Meloxicam and Gabapentin for pain management. She also reports a recent fall that resulted in chest pain and has noticed a lump in her breast. She has not had a recent mammogram.         All other systems were reviewed with the patient and are negative.  MEDICAL HISTORY:  Past Medical History:  Diagnosis Date   Arthritis    Bilateral Knees   Cancer (HCC) 03/11/2022   Breast   Family history of breast cancer 03/21/2022   Family history of prostate cancer 03/21/2022   Hypercholesteremia    Juvenile arthritis (HCC)    Neuropathy     SURGICAL HISTORY: Past Surgical History:  Procedure Laterality Date   AXILLARY SENTINEL NODE BIOPSY Left 05/14/2022   Procedure: LEFT AXILLARY SENTINEL NODE BIOPSY;  Surgeon: [name], MD;  Location: [facility] OR;  Service: General;  Laterality: Left;  GEN w/ PEC BLOCK   BREAST BIOPSY Left 03/11/2022   BREAST LUMPECTOMY WITH AXILLARY LYMPH NODE BIOPSY Left 04/16/2022   Procedure: LEFT BREAST LUMPECTOMY WITH LEFT AXILLARY SENTINEL LYMPH NODE BIOPSY;  Surgeon: [name], MD;  Location: [facility];  Service: General;  Laterality: Left;   DENTAL SURGERY     PORTACATH PLACEMENT Right 04/16/2022   Procedure: INSERTION PORT-A-CATH;  Surgeon: [name], MD;  Location: [facility];  Service: General;  Laterality: Right;    I have reviewed the social history and family history with the patient and they are unchanged from previous note.  ALLERGIES:  has no known allergies.  MEDICATIONS:  Current Outpatient Medications  Medication Sig Dispense Refill   gabapentin (NEURONTIN) 300 MG capsule Take 1 capsule (300 mg total) by mouth in the morning and at bedtime. 180 capsule 1    meloxicam (MOBIC) 15 MG tablet Take 1 tablet (15 mg total) by mouth daily. 90 tablet 1   No current facility-administered medications for this visit.   Facility-Administered Medications Ordered in Other Visits  Medication Dose Route Frequency Provider Last Rate Last Admin   diphenhydrAMINE (BENADRYL) 50 MG/ML injection            famotidine (PEPCID) 20-0.9 MG/50ML-% IVPB             PHYSICAL EXAMINATION: ECOG PERFORMANCE STATUS: 2 - Symptomatic, <50% confined to bed  Vitals:   10/30/23 1014  BP: 102/68  Pulse: 68  Resp: 16  Temp: (!) 97 F (36.1 C)  SpO2: 95%   Wt Readings from Last 3 Encounters:  10/30/23 176 lb 6.4 oz (80 kg)  09/16/23 188 lb (85.3 kg)  08/18/23 189 lb (85.7 kg)     GENERAL:alert, no distress and comfortable SKIN: skin color, texture, turgor are normal, no rashes or significant lesions EYES: normal, Conjunctiva are pink and non-injected, sclera clear NECK: supple, thyroid normal size, non-tender, without nodularity LYMPH:  no palpable lymphadenopathy in the cervical, axillary  LUNGS: clear to auscultation and percussion with normal breathing effort HEART: regular rate & rhythm and no  murmurs and no lower extremity edema ABDOMEN:abdomen soft, non-tender and normal bowel sounds Musculoskeletal:no cyanosis of digits and no clubbing  NEURO: alert & oriented x 3 with fluent speech, no focal motor/sensory deficits BREAST: Left breast with significant swelling and lymphedema, warm to touch, with a lump above incision approximately 1.5 cm in size. Right breast without pain or abnormalities.  No palpable axillary adenopathy, except some scar tissue in left axilla.     LABORATORY DATA:  I have reviewed the data as listed    Latest Ref Rng & Units 10/30/2023    9:40 AM 09/18/2023    9:30 AM 06/19/2023   10:45 AM  CBC  WBC 4.0 - 10.5 K/uL 5.2  5.1  4.5   Hemoglobin 12.0 - 15.0 g/dL 16.1  09.6  04.5   Hematocrit 36.0 - 46.0 % 35.0  36.8  36.6   Platelets 150  - 400 K/uL 224  217  262         Latest Ref Rng & Units 10/30/2023    9:40 AM 09/18/2023    9:30 AM 06/19/2023   10:45 AM  CMP  Glucose 70 - 99 mg/dL 79  82  82   BUN 6 - 20 mg/dL 11  10  14    Creatinine 0.44 - 1.00 mg/dL 4.09  8.11  9.14   Sodium 135 - 145 mmol/L 138  138  138   Potassium 3.5 - 5.1 mmol/L 3.4  3.7  3.7   Chloride 98 - 111 mmol/L 107  106  105   CO2 22 - 32 mmol/L 27  28  27    Calcium 8.9 - 10.3 mg/dL 78.2  95.6  21.3   Total Protein 6.5 - 8.1 g/dL 6.8  6.6  6.8   Total Bilirubin <1.2 mg/dL 0.4  0.5  0.6   Alkaline Phos 38 - 126 U/L 95  96  99   AST 15 - 41 U/L 21  23  25    ALT 0 - 44 U/L 8  9  12        RADIOGRAPHIC STUDIES: I have personally reviewed the radiological images as listed and agreed with the findings in the report. No results found.    Orders Placed This Encounter  Procedures   CT CHEST ABDOMEN PELVIS W CONTRAST    Standing Status:   Future    Expected Date:   11/13/2023    Expiration Date:   10/29/2024    If indicated for the ordered procedure, I authorize the administration of contrast media per Radiology protocol:   Yes    Does the patient have a contrast media/X-ray dye allergy?:   No    Is patient pregnant?:   No    Preferred imaging location?:   [facility]    If indicated for the ordered procedure, I authorize the administration of oral contrast media per Radiology protocol:   Yes   MM 3D DIAGNOSTIC MAMMOGRAM BILATERAL BREAST    Standing Status:   Future    Expected Date:   11/06/2023    Expiration Date:   10/29/2024    Reason for Exam (SYMPTOM  OR DIAGNOSIS REQUIRED):   left breast lump above incision line, rule out recurernce    [facility]    Is the patient pregnant?:   No   [place] LIMITED ULTRASOUND INCLUDING AXILLA LEFT BREAST     Standing Status:   Future    Expected Date:   11/06/2023  Expiration Date:   10/29/2024    Reason for Exam (SYMPTOM  OR DIAGNOSIS REQUIRED):   left  breast lump above incision line, rule out recurernce    Preferred imaging location?:   [facility]   Ambulatory referral to Physical Therapy    Referral Priority:   Routine    Referral Type:   Physical Medicine    Referral Reason:   Specialty Services Required    Requested Specialty:   Physical Therapy    Number of Visits Requested:   1   All questions were answered. The patient knows to call the clinic with any problems, questions or concerns. No barriers to learning was detected. The total time spent in the appointment was 40 minutes.     Malachy Mood, MD 10/30/2023

## 2023-11-13 ENCOUNTER — Ambulatory Visit
Admission: RE | Admit: 2023-11-13 | Discharge: 2023-11-13 | Disposition: A | Discharge status: Home / Self Care | Payer: Medicaid Other | Source: Ambulatory Visit | Attending: Hematology | Admitting: Hematology

## 2023-11-13 ENCOUNTER — Other Ambulatory Visit: Payer: Self-pay | Admitting: Hematology

## 2023-11-13 ENCOUNTER — Ambulatory Visit
Admission: RE | Admit: 2023-11-13 | Discharge: 2023-11-13 | Disposition: A | Discharge status: Home / Self Care | Payer: Commercial Managed Care - HMO | Source: Ambulatory Visit | Attending: Hematology | Admitting: Hematology

## 2023-11-13 DIAGNOSIS — N6320 Unspecified lump in the left breast, unspecified quadrant: Secondary | ICD-10-CM

## 2023-11-13 DIAGNOSIS — C50412 Malignant neoplasm of upper-outer quadrant of left female breast: Secondary | ICD-10-CM

## 2023-11-13 HISTORY — DX: Personal history of irradiation: Z92.3

## 2023-11-13 HISTORY — DX: Personal history of antineoplastic chemotherapy: Z92.21

## 2023-11-14 ENCOUNTER — Other Ambulatory Visit: Payer: Self-pay

## 2023-11-14 ENCOUNTER — Ambulatory Visit (HOSPITAL_COMMUNITY)
Admission: RE | Admit: 2023-11-14 | Discharge: 2023-11-14 | Disposition: A | Discharge status: Home / Self Care | Payer: Medicaid Other | Source: Ambulatory Visit | Attending: Hematology | Admitting: Hematology

## 2023-11-14 ENCOUNTER — Encounter: Payer: Self-pay | Admitting: Hematology

## 2023-11-14 ENCOUNTER — Encounter (HOSPITAL_COMMUNITY): Payer: Self-pay

## 2023-11-14 DIAGNOSIS — Z171 Estrogen receptor negative status [ER-]: Secondary | ICD-10-CM

## 2023-11-14 NOTE — Progress Notes (Signed)
Received the following Secure Chat message from Ms. Drusilla Kanner in Radiology:  Gabrielle [name],dob 12/28/1990 was scheduled for ct cap today 10/15/23, patient came for her ct,was on the table to be scanned and then decided she did not want to be scanned, ? anxiety, not sure. thanks - vanda   Canceled orders for CT CAP w/contrast

## 2023-11-17 ENCOUNTER — Ambulatory Visit: Payer: Medicaid Other | Admitting: Rehabilitation

## 2023-11-20 ENCOUNTER — Encounter: Payer: Self-pay | Admitting: Hematology

## 2023-11-21 ENCOUNTER — Inpatient Hospital Stay: Payer: Commercial Managed Care - HMO | Attending: Hematology | Admitting: Hematology

## 2023-11-21 DIAGNOSIS — Z1722 Progesterone receptor negative status: Secondary | ICD-10-CM | POA: Insufficient documentation

## 2023-11-21 DIAGNOSIS — Z171 Estrogen receptor negative status [ER-]: Secondary | ICD-10-CM

## 2023-11-21 DIAGNOSIS — F419 Anxiety disorder, unspecified: Secondary | ICD-10-CM | POA: Insufficient documentation

## 2023-11-21 DIAGNOSIS — Z8042 Family history of malignant neoplasm of prostate: Secondary | ICD-10-CM | POA: Insufficient documentation

## 2023-11-21 DIAGNOSIS — Z79899 Other long term (current) drug therapy: Secondary | ICD-10-CM | POA: Insufficient documentation

## 2023-11-21 DIAGNOSIS — Z803 Family history of malignant neoplasm of breast: Secondary | ICD-10-CM | POA: Insufficient documentation

## 2023-11-21 DIAGNOSIS — R079 Chest pain, unspecified: Secondary | ICD-10-CM | POA: Insufficient documentation

## 2023-11-21 DIAGNOSIS — Z1732 Human epidermal growth factor receptor 2 negative status: Secondary | ICD-10-CM | POA: Insufficient documentation

## 2023-11-21 DIAGNOSIS — Z923 Personal history of irradiation: Secondary | ICD-10-CM | POA: Insufficient documentation

## 2023-11-21 DIAGNOSIS — C50412 Malignant neoplasm of upper-outer quadrant of left female breast: Secondary | ICD-10-CM

## 2023-11-21 DIAGNOSIS — Z9221 Personal history of antineoplastic chemotherapy: Secondary | ICD-10-CM | POA: Insufficient documentation

## 2023-11-21 MED ORDER — LORAZEPAM 1 MG PO TABS: 1.0000 mg | ORAL_TABLET | Freq: Once | ORAL | 0 refills | Status: DC

## 2023-11-21 NOTE — Assessment & Plan Note (Signed)
 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 SLN biopsy on 05/14/22 was negative (0/4). -she started zoladex on 06/03/22. -she began chemotherapy with carboplatin/taxol and Keytruda on 06/14/22. Taxol switched to abraxane with C2D8 due to itching and mild numbness. 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 -she switched to [facility] with continuing Keytruda on 09/19/22. She tolerated poorly with persistent N/V, fatigue and anorexia, so treatment was stopped after one cycle, I encourage her to continue Keytruda for one year but she declined  -S/p radiation 01/16/23 - 03/06/2023,

## 2023-11-25 ENCOUNTER — Ambulatory Visit
Admission: RE | Admit: 2023-11-25 | Discharge: 2023-11-25 | Disposition: A | Discharge status: Home / Self Care | Payer: Commercial Managed Care - HMO | Source: Ambulatory Visit | Attending: Hematology | Admitting: Hematology

## 2023-11-25 DIAGNOSIS — N6320 Unspecified lump in the left breast, unspecified quadrant: Secondary | ICD-10-CM

## 2023-11-25 HISTORY — PX: BREAST BIOPSY: SHX20

## 2023-11-26 ENCOUNTER — Encounter: Payer: Self-pay | Admitting: *Deleted

## 2023-11-26 LAB — SURGICAL PATHOLOGY

## 2023-11-26 NOTE — Progress Notes (Unsigned)
 Patient had a positive biopsy yesterday and needs to be seen as soon as possible to discuss next steps.

## 2023-11-26 NOTE — Progress Notes (Signed)
 Surgical consultation with Dr. Dwain Sarna has been requested and new med/onc follow up also.

## 2023-11-27 ENCOUNTER — Telehealth: Payer: Self-pay | Admitting: Hematology

## 2023-11-27 NOTE — Telephone Encounter (Signed)
 Patient is aware of scheduled appointment times/dates

## 2023-11-28 ENCOUNTER — Other Ambulatory Visit: Payer: Self-pay

## 2023-11-28 DIAGNOSIS — Z171 Estrogen receptor negative status [ER-]: Secondary | ICD-10-CM

## 2023-12-01 ENCOUNTER — Encounter (HOSPITAL_COMMUNITY)
Admission: RE | Admit: 2023-12-01 | Discharge: 2023-12-01 | Disposition: A | Discharge status: Home / Self Care | Payer: Commercial Managed Care - HMO | Source: Ambulatory Visit | Attending: Hematology | Admitting: Hematology

## 2023-12-01 ENCOUNTER — Encounter: Payer: Self-pay | Admitting: Hematology

## 2023-12-01 DIAGNOSIS — C50412 Malignant neoplasm of upper-outer quadrant of left female breast: Secondary | ICD-10-CM | POA: Insufficient documentation

## 2023-12-01 DIAGNOSIS — Z171 Estrogen receptor negative status [ER-]: Secondary | ICD-10-CM | POA: Insufficient documentation

## 2023-12-01 MED ORDER — TECHNETIUM TC 99M MEDRONATE IV KIT
20.0000 | PACK | Freq: Once | INTRAVENOUS | Status: AC
  Administered 2023-12-01: 21.6 via INTRAVENOUS

## 2023-12-04 ENCOUNTER — Other Ambulatory Visit: Payer: Self-pay

## 2023-12-04 ENCOUNTER — Ambulatory Visit: Payer: Medicaid Other | Admitting: Rehabilitation

## 2023-12-04 ENCOUNTER — Encounter: Payer: Self-pay | Admitting: Hematology

## 2023-12-04 ENCOUNTER — Inpatient Hospital Stay (HOSPITAL_BASED_OUTPATIENT_CLINIC_OR_DEPARTMENT_OTHER): Payer: Commercial Managed Care - HMO | Admitting: Hematology

## 2023-12-04 VITALS — BP 106/73 | HR 64 | Temp 98.2°F | Resp 18 | Ht 69.25 in | Wt 176.8 lb

## 2023-12-04 DIAGNOSIS — C50412 Malignant neoplasm of upper-outer quadrant of left female breast: Secondary | ICD-10-CM

## 2023-12-04 DIAGNOSIS — Z8042 Family history of malignant neoplasm of prostate: Secondary | ICD-10-CM | POA: Diagnosis not present

## 2023-12-04 DIAGNOSIS — Z923 Personal history of irradiation: Secondary | ICD-10-CM | POA: Diagnosis not present

## 2023-12-04 DIAGNOSIS — Z1722 Progesterone receptor negative status: Secondary | ICD-10-CM | POA: Diagnosis not present

## 2023-12-04 DIAGNOSIS — Z9221 Personal history of antineoplastic chemotherapy: Secondary | ICD-10-CM | POA: Diagnosis not present

## 2023-12-04 DIAGNOSIS — F419 Anxiety disorder, unspecified: Secondary | ICD-10-CM | POA: Diagnosis not present

## 2023-12-04 DIAGNOSIS — Z171 Estrogen receptor negative status [ER-]: Secondary | ICD-10-CM

## 2023-12-04 DIAGNOSIS — Z79899 Other long term (current) drug therapy: Secondary | ICD-10-CM | POA: Diagnosis not present

## 2023-12-04 DIAGNOSIS — Z803 Family history of malignant neoplasm of breast: Secondary | ICD-10-CM | POA: Diagnosis not present

## 2023-12-04 DIAGNOSIS — R079 Chest pain, unspecified: Secondary | ICD-10-CM | POA: Diagnosis not present

## 2023-12-04 DIAGNOSIS — Z1732 Human epidermal growth factor receptor 2 negative status: Secondary | ICD-10-CM | POA: Diagnosis not present

## 2023-12-04 MED ORDER — LORAZEPAM 1 MG PO TABS: 1.0000 mg | ORAL_TABLET | Freq: Three times a day (TID) | ORAL | 0 refills | Status: DC | PRN

## 2023-12-04 NOTE — Progress Notes (Signed)
[facility]   Telephone:([phone]) [phone] Fax:([phone]   Clinic Follow up Note   Patient Care Team: Patient, [name] as PCP - General (General Practice) [name], RN as Oncology Nurse Navigator [name], RN as Oncology Nurse Navigator [name], MD as Consulting Physician (General Surgery) Malachy Mood, MD as Consulting Physician (Hematology) Dorothy Puffer, MD as Consulting Physician (Radiation Oncology) Ranee Gosselin, MD as Consulting Physician (Orthopedic Surgery) Raymondo Band as Physician Assistant (Hematology and Oncology) Imaging, [facility] as Radiologist (Diagnostic Radiology)  Date of Service:  12/04/2023  CHIEF COMPLAINT: f/u of recurrent left breast cancer  CURRENT THERAPY:  Pending surgery  Oncology History   Malignant neoplasm of upper-outer quadrant of left breast in female, 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 SLN biopsy on 05/14/22 was negative (0/4). -she started zoladex on 06/03/22. -she began chemotherapy with carboplatin/taxol and Keytruda on 06/14/22. Taxol switched to abraxane with C2D8 due to itching and mild numbness. 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 -she switched to [facility] with continuing Keytruda on 09/19/22. She tolerated poorly with persistent N/V, fatigue and anorexia, so treatment was stopped after one cycle, I encourage her to continue Keytruda for one year but she declined  -S/p radiation 01/16/23 - 03/06/2023 -she had local recurrence of metaplastic carcinoma in 11/2023, plan for mastectomy. 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 Previous surgery and partial chemotherapy were not fully tolerated. Current recurrence causes significant pain. Bone scan negative; CT scan pending for metastasis. Patient anxious about mastectomy and potential further chemotherapy. Surgery is the best option for cure if CT is negative. If metastasis is present, chemotherapy and immunotherapy will be considered. Previous chemotherapy included 8 doses of Taxol and 1 cycle of Adriamycin and Cytoxan, but regimen was not completed. Discussed retrying chemotherapy with adjusted doses and intervals. Patient prefers to avoid surgery but understands its necessity for potential cure. Not interested in breast reconstruction. Emphasized importance of surgery for long-term survival. - Order CT scan of the chest, abdomen, and pelvis - Schedule mastectomy if CT scan is negative - Discuss potential for chemotherapy and immunotherapy post-surgery, to completed 3 more cycle AC and one year [place]  - Prescribe lorazepam (Ativan) for anxiety, 1-2 tablets as needed, especially before the CT scan -Discuss with social worker regarding disability application  Anxiety Significant anxiety related to cancer diagnosis and upcoming procedures. Lorazepam previously prescribed and helpful. Patient prefers to be asleep before entering the OR due to past traumatic experience. - Prescribe lorazepam (Ativan) 1-2 tablets as needed for anxiety - Ensure patient has a driver for the CT scan - Discuss potential dependence on lorazepam and need for gradual tapering if used regularly  Plan -we scheduled her CT scan for next Tuesday, she is aware of her appointment -We will call her with the CT results next week - Coordinate with Dr. Dwain Sarna regarding surgery scheduling if CT scan negative - Schedule follow-up appointment post-surgery  to discuss further treatment options.       SUMMARY OF  ONCOLOGIC HISTORY: Oncology History Overview Note   Cancer Staging  Malignant neoplasm of upper-outer quadrant of left breast in female, estrogen receptor negative (HCC) Staging form: Breast, AJCC 8th Edition - Clinical stage from 03/11/2022: Stage IIB (cT2, cN0, cM0, G3, ER-, PR-, HER2-) - Signed by Malachy Mood, MD on 03/19/2022    Malignant neoplasm of upper-outer quadrant of left breast in female, estrogen receptor negative (HCC)  03/08/2022 Mammogram   CLINICAL DATA:  33 year old female presenting for evaluation of a palpable lump in the left breast which she feels has increased in size since she first identified it. Her mother was diagnosed with breast cancer within the last 6 months. She also has family history of breast cancer in multiple aunts and her maternal grandmother.   EXAM: DIGITAL DIAGNOSTIC BILATERAL MAMMOGRAM WITH TOMOSYNTHESIS AND CAD; ULTRASOUND LEFT BREAST LIMITED  IMPRESSION: 1. There is a suspicious 3.7 cm mass in the left breast at 12 o'clock.   2.  No evidence of left axillary lymphadenopathy.   3.  No evidence of malignancy in the right breast.   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 Based on the biopsy, the carcinoma appears Nottingham grade 3 of 3 and measures 1.2 cm in greatest linear extent.  PROGNOSTIC INDICATORS Results: The tumor cells are NEGATIVE for Her2 (0). Estrogen Receptor: 0%, NEGATIVE Progesterone  Receptor: 0%, NEGATIVE Proliferation Marker Ki67: 40%   03/15/2022 Initial Diagnosis   Malignant neoplasm of upper-outer quadrant of left breast in female, estrogen receptor negative (HCC)   03/28/2022 Genetic Testing   Negative hereditary cancer genetic testing: no pathogenic variants detected in [facility] BRCAPlus Panel or [employer] CustomNext-Cancer +RNAinsight Panel.  Report dates are 03/28/2022 and 03/31/2022. Marland Kitchen   The BRCAplus panel offered by [employer] and includes sequencing and deletion/duplication analysis for the following 8 genes: ATM, BRCA1, BRCA2, CDH1, CHEK2, PALB2, PTEN, and TP53.  The CustomNext-Cancer+RNAinsight panel offered by [employer] includes sequencing and rearrangement analysis for the following 47 genes:  APC, ATM, AXIN2, BARD1, BMPR1A, BRCA1, BRCA2, BRIP1, CDH1, CDK4, CDKN2A, CHEK2, DICER1, EPCAM, GREM1, HOXB13, MEN1, MLH1, MSH2, MSH3, MSH6, MUTYH, NBN, NF1, NF2, NTHL1, PALB2, PMS2, POLD1, POLE, PTEN, RAD51C, RAD51D, RECQL, RET, SDHA, SDHAF2, SDHB, SDHC, SDHD, SMAD4, SMARCA4, STK11, TP53, TSC1, TSC2, and VHL.  RNA data is routinely analyzed for use in variant interpretation for all genes.   05/14/2022 Cancer Staging   Staging form: Breast, AJCC 8th Edition - Pathologic stage from 05/14/2022: Stage IIA (pT2, pN0, cM0, G3, ER-, PR-, HER2-) - Signed by Malachy Mood, MD on 05/28/2022 Stage prefix: Initial diagnosis Histologic grading system: 3 grade system Residual tumor (R): R0 - None   06/13/2022 -  Chemotherapy   Patient is on Treatment Plan : BREAST Pembrolizumab (200) D1 + Carboplatin (1.5) D1,8,15 + Paclitaxel (80) D1,8,15 q21d X 4 cycles / Pembrolizumab (200) D1 + AC D1 q21d x 4 cycles     06/14/2022 - 07/05/2022 Chemotherapy   Patient is on Treatment Plan : BREAST Pembrolizumab (200) D1 + Carboplatin (5) D1 + Paclitaxel (80) D1,8,15 q21d X 4  cycles / Pembrolizumab (200) D1 + AC D1 q21d x 4 cycles        Discussed the use of AI scribe software for clinical note  transcription with the patient, who gave verbal consent to proceed.  History of Present Illness   The patient, a 33 year old female with a history of left breast cancer, presents for a follow-up visit. She was previously seen by Dr. Dwain Sarna who confirmed the recurrence of the same cancer in the left breast. The patient expresses anxiety and fear about the upcoming mastectomy, which has been recommended due to her poor tolerance of chemotherapy during her previous treatment. She also mentions that she has been experiencing chest pain on the left side, which she attributes to the tumor. The pain has been present for a couple of months and is described as a constant ache. The patient also reports having anxiety issues, which are exacerbated by the upcoming CT scan and surgery. She has been prescribed Ativan for this, which she reports helps to calm her down. The patient also mentions that she has been experiencing physical limitations due to her condition, including difficulty opening things with her left hand and weakness in her left arm.         All other systems were reviewed with the patient and are negative.  MEDICAL HISTORY:  Past Medical History:  Diagnosis Date   Arthritis    Bilateral Knees   Cancer (HCC) 03/11/2022   Breast   Family history of breast cancer 03/21/2022   Family history of prostate cancer 03/21/2022   Hypercholesteremia    Juvenile arthritis (HCC)    Neuropathy    Personal history of chemotherapy    Personal history of radiation therapy     SURGICAL HISTORY: Past Surgical History:  Procedure Laterality Date   AXILLARY SENTINEL NODE BIOPSY Left 05/14/2022   Procedure: LEFT AXILLARY SENTINEL NODE BIOPSY;  Surgeon: [name], MD;  Location: [facility] OR;  Service: General;  Laterality: Left;  GEN w/ PEC BLOCK   BREAST BIOPSY Left 03/11/2022   BREAST BIOPSY Left 11/25/2023   [place] LT BREAST BX W LOC DEV 1ST LESION IMG BX SPEC US GUIDE 11/25/2023 [facility] MAMMOGRAPHY    BREAST LUMPECTOMY WITH AXILLARY LYMPH NODE BIOPSY Left 04/16/2022   Procedure: LEFT BREAST LUMPECTOMY WITH LEFT AXILLARY SENTINEL LYMPH NODE BIOPSY;  Surgeon: [name], MD;  Location: [facility];  Service: General;  Laterality: Left;   DENTAL SURGERY     PORTACATH PLACEMENT Right 04/16/2022   Procedure: INSERTION PORT-A-CATH;  Surgeon: [name], MD;  Location: [facility];  Service: General;  Laterality: Right;    I have reviewed the social history and family history with the patient and they are unchanged from previous note.  ALLERGIES:  has no known allergies.  MEDICATIONS:  Current Outpatient Medications  Medication Sig Dispense Refill   gabapentin (NEURONTIN) 300 MG capsule Take 1 capsule (300 mg total) by mouth in the morning and at bedtime. 180 capsule 1   LORazepam (ATIVAN) 1 MG tablet Take 1 tablet (1 mg total) by mouth every 8 (eight) hours as needed for anxiety. Take 1 tab 1 hour before CT scan or breast biopsy, if not enough, OK to take second dose before CT 30 tablet 0   meloxicam (MOBIC) 15 MG tablet Take 1 tablet (15 mg total) by mouth daily. 90 tablet 1   No current facility-administered medications for this visit.   Facility-Administered Medications Ordered in Other Visits  Medication  Dose Route Frequency Provider Last Rate Last Admin   diphenhydrAMINE (BENADRYL) 50 MG/ML injection            famotidine (PEPCID) 20-0.9 MG/50ML-% IVPB             PHYSICAL EXAMINATION: ECOG PERFORMANCE STATUS: 1  Vitals:   12/04/23 1041  BP: 106/73  Pulse: 64  Resp: 18  Temp: 98.2 F (36.8 C)  SpO2: 100%   Wt Readings from Last 3 Encounters:  12/04/23 176 lb 12.8 oz (80.2 kg)  10/30/23 176 lb 6.4 oz (80 kg)  09/16/23 188 lb (85.3 kg)     GENERAL:alert, no distress and comfortable SKIN: skin color, texture, turgor are normal, no rashes or significant lesions EYES: normal, Conjunctiva are pink and non-injected, sclera  clear NECK: supple, thyroid normal size, non-tender, without nodularity LYMPH:  no palpable lymphadenopathy in the cervical, axillary  LUNGS: clear to auscultation and percussion with normal breathing effort HEART: regular rate & rhythm and no murmurs and no lower extremity edema ABDOMEN:abdomen soft, non-tender and normal bowel sounds Musculoskeletal:no cyanosis of digits and no clubbing  NEURO: alert & oriented x 3 with fluent speech, no focal motor/sensory deficits   LABORATORY DATA:  I have reviewed the data as listed    Latest Ref Rng & Units 10/30/2023    9:40 AM 09/18/2023    9:30 AM 06/19/2023   10:45 AM  CBC  WBC 4.0 - 10.5 K/uL 5.2  5.1  4.5   Hemoglobin 12.0 - 15.0 g/dL 62.1  30.8  65.7   Hematocrit 36.0 - 46.0 % 35.0  36.8  36.6   Platelets 150 - 400 K/uL 224  217  262         Latest Ref Rng & Units 10/30/2023    9:40 AM 09/18/2023    9:30 AM 06/19/2023   10:45 AM  CMP  Glucose 70 - 99 mg/dL 79  82  82   BUN 6 - 20 mg/dL 11  10  14    Creatinine 0.44 - 1.00 mg/dL 8.46  9.62  9.52   Sodium 135 - 145 mmol/L 138  138  138   Potassium 3.5 - 5.1 mmol/L 3.4  3.7  3.7   Chloride 98 - 111 mmol/L 107  106  105   CO2 22 - 32 mmol/L 27  28  27    Calcium 8.9 - 10.3 mg/dL 84.1  32.4  40.1   Total Protein 6.5 - 8.1 g/dL 6.8  6.6  6.8   Total Bilirubin <1.2 mg/dL 0.4  0.5  0.6   Alkaline Phos 38 - 126 U/L 95  96  99   AST 15 - 41 U/L 21  23  25    ALT 0 - 44 U/L 8  9  12        RADIOGRAPHIC STUDIES: I have personally reviewed the radiological images as listed and agreed with the findings in the report. No results found.    No orders of the defined types were placed in this encounter.  All questions were answered. The patient knows to call the clinic with any problems, questions or concerns. No barriers to learning was detected. The total time spent in the appointment was 30 minutes.     Malachy Mood, MD 12/04/2023

## 2023-12-04 NOTE — Assessment & Plan Note (Signed)
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 SLN biopsy on 05/14/22 was negative (0/4). -she started zoladex on 06/03/22. -she began chemotherapy with carboplatin/taxol and Keytruda on 06/14/22. Taxol switched to abraxane with C2D8 due to itching and mild numbness. 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 -she switched to [facility] with continuing Keytruda on 09/19/22. She tolerated poorly with persistent N/V, fatigue and anorexia, so treatment was stopped after one cycle, I encourage her to continue Keytruda for one year but she declined  -S/p radiation 01/16/23 - 03/06/2023 -she had local recurrence of metaplastic carcinoma in 11/2023, plan for mastectomy. -staging bone scan is negative, pt has been very resistant for CT, I again encouraged her to schedule

## 2023-12-09 ENCOUNTER — Ambulatory Visit: Payer: Managed Care, Other (non HMO) | Admitting: Hematology

## 2023-12-09 ENCOUNTER — Ambulatory Visit (HOSPITAL_COMMUNITY): Payer: Commercial Managed Care - HMO

## 2023-12-09 ENCOUNTER — Other Ambulatory Visit: Payer: Managed Care, Other (non HMO)

## 2023-12-09 ENCOUNTER — Other Ambulatory Visit: Payer: Self-pay

## 2023-12-18 ENCOUNTER — Ambulatory Visit (HOSPITAL_COMMUNITY)
Admission: RE | Admit: 2023-12-18 | Discharge: 2023-12-18 | Disposition: A | Discharge status: Home / Self Care | Payer: Commercial Managed Care - HMO | Source: Ambulatory Visit | Attending: Hematology | Admitting: Hematology

## 2023-12-18 DIAGNOSIS — Z171 Estrogen receptor negative status [ER-]: Secondary | ICD-10-CM | POA: Insufficient documentation

## 2023-12-18 DIAGNOSIS — C50412 Malignant neoplasm of upper-outer quadrant of left female breast: Secondary | ICD-10-CM | POA: Diagnosis present

## 2023-12-18 MED ORDER — IOHEXOL 300 MG/ML  SOLN
100.0000 mL | Freq: Once | INTRAMUSCULAR | Status: AC | PRN
  Administered 2023-12-18: 100 mL via INTRAVENOUS

## 2023-12-19 ENCOUNTER — Telehealth: Payer: Self-pay | Admitting: Hematology

## 2023-12-19 ENCOUNTER — Telehealth: Payer: Self-pay | Admitting: *Deleted

## 2023-12-19 NOTE — Telephone Encounter (Signed)
Scheduled patient's appts. Advised patient to contact us if rescheduling is needed.

## 2023-12-19 NOTE — Telephone Encounter (Signed)
Attempted to call patient to give results of CT scan. Unable to leave a VM due not set up.

## 2023-12-26 NOTE — Pre-Procedure Instructions (Addendum)
 Surgical Instructions   Your procedure is scheduled on January 01, 2024. Report to [facility] Main Entrance "A" at 6:00 A.M., then check in with the Admitting office. Any questions or running late day of surgery: call [phone]  Questions prior to your surgery date: call [phone], Monday-Friday, 8am-4pm. If you experience any cold or flu symptoms such as cough, fever, chills, shortness of breath, etc. between now and your scheduled surgery, please notify us  at the above number.     Remember:  Do not eat after midnight the night before your surgery   You may drink clear liquids until 5:00 AM the morning of your surgery.   Clear liquids allowed are: Water, Non-Citrus Juices (without pulp), Carbonated Beverages, Clear Tea (no milk, honey, etc.), Black Coffee Only (NO MILK, CREAM OR POWDERED CREAMER of any kind), and Gatorade.  Patient Instructions  The night before surgery:  No food after midnight. ONLY clear liquids after midnight  The day of surgery (if you do NOT have diabetes):  Drink ONE (1) Pre-Surgery Clear Ensure by 5:00 AM the morning of surgery. Drink in one sitting. Do not sip.  This drink was given to you during your hospital  pre-op appointment visit.  Nothing else to drink after completing the  Pre-Surgery Clear Ensure.         If you have questions, please contact your surgeon's office.     Take these medicines the morning of surgery with A SIP OF WATER: gabapentin  (NEURONTIN ) - may take if needed   One week prior to surgery, STOP taking any Aspirin (unless otherwise instructed by your surgeon) Aleve , Naproxen , Ibuprofen , Motrin , Advil , Goody's, BC's, all herbal medications, fish oil, and non-prescription vitamins. This includes your medication: meloxicam  (MOBIC )                      Do NOT Smoke (Tobacco/Vaping) for 24 hours prior to your procedure.  If you use a CPAP at night, you may bring your mask/headgear for your overnight stay.   You will be  asked to remove any contacts, glasses, piercing's, hearing aid's, dentures/partials prior to surgery. Please bring cases for these items if needed.    Patients discharged the day of surgery will not be allowed to drive home, and someone needs to stay with them for 24 hours.  SURGICAL WAITING ROOM VISITATION Patients may have no more than 2 support people in the waiting area - these visitors may rotate.   Pre-op nurse will coordinate an appropriate time for 1 ADULT support person, who may not rotate, to accompany patient in pre-op.  Children under the age of 30 must have an adult with them who is not the patient and must remain in the main waiting area with an adult.  If the patient needs to stay at the hospital during part of their recovery, the visitor guidelines for inpatient rooms apply.  Please refer to the [facility] website for the visitor guidelines for any additional information.   If you received a COVID test during your pre-op visit  it is requested that you wear a mask when out in public, stay away from anyone that may not be feeling well and notify your surgeon if you develop symptoms. If you have been in contact with anyone that has tested positive in the last 10 days please notify you surgeon.      Pre-operative CHG Bathing Instructions   You can play a key role in reducing the risk of infection  after surgery. Your skin needs to be as free of germs as possible. You can reduce the number of germs on your skin by washing with CHG (chlorhexidine  gluconate) soap before surgery. CHG is an antiseptic soap that kills germs and continues to kill germs even after washing.   DO NOT use if you have an allergy to chlorhexidine /CHG or antibacterial soaps. If your skin becomes reddened or irritated, stop using the CHG and notify one of our RNs at [phone].              TAKE A SHOWER THE NIGHT BEFORE SURGERY AND THE DAY OF SURGERY    Please keep in mind the following:  DO NOT shave,  including legs and underarms, 48 hours prior to surgery.   You may shave your face before/day of surgery.  Place clean sheets on your bed the night before surgery Use a clean washcloth (not used since being washed) for each shower. DO NOT sleep with pet's night before surgery.  CHG Shower Instructions:  Wash your face and private area with normal soap. If you choose to wash your hair, wash first with your normal shampoo.  After you use shampoo/soap, rinse your hair and body thoroughly to remove shampoo/soap residue.  Turn the water OFF and apply half the bottle of CHG soap to a CLEAN washcloth.  Apply CHG soap ONLY FROM YOUR NECK DOWN TO YOUR TOES (washing for 3-5 minutes)  DO NOT use CHG soap on face, private areas, open wounds, or sores.  Pay special attention to the area where your surgery is being performed.  If you are having back surgery, having someone wash your back for you may be helpful. Wait 2 minutes after CHG soap is applied, then you may rinse off the CHG soap.  Pat dry with a clean towel  Put on clean pajamas    Additional instructions for the day of surgery: DO NOT APPLY any lotions, deodorants, cologne, or perfumes.   Do not wear jewelry or makeup Do not wear nail polish, gel polish, artificial nails, or any other type of covering on natural nails (fingers and toes) Do not bring valuables to the hospital. [facility] is not responsible for valuables/personal belongings. Put on clean/comfortable clothes.  Please brush your teeth.  Ask your nurse before applying any prescription medications to the skin.

## 2023-12-29 ENCOUNTER — Encounter (HOSPITAL_COMMUNITY): Payer: Self-pay

## 2023-12-29 ENCOUNTER — Encounter (HOSPITAL_COMMUNITY)
Admission: RE | Admit: 2023-12-29 | Discharge: 2023-12-29 | Disposition: A | Discharge status: Home / Self Care | Payer: Commercial Managed Care - HMO | Source: Ambulatory Visit | Attending: General Surgery | Admitting: General Surgery

## 2023-12-29 ENCOUNTER — Other Ambulatory Visit: Payer: Self-pay

## 2023-12-29 ENCOUNTER — Other Ambulatory Visit: Payer: Self-pay | Admitting: General Surgery

## 2023-12-29 VITALS — BP 104/65 | HR 77 | Temp 98.1°F | Resp 16 | Ht 69.0 in | Wt 167.1 lb

## 2023-12-29 DIAGNOSIS — Z01812 Encounter for preprocedural laboratory examination: Secondary | ICD-10-CM | POA: Diagnosis present

## 2023-12-29 DIAGNOSIS — Z01818 Encounter for other preprocedural examination: Secondary | ICD-10-CM

## 2023-12-29 HISTORY — DX: Anxiety disorder, unspecified: F41.9

## 2023-12-29 LAB — CBC
HCT: 34.4 % — ABNORMAL LOW (ref 36.0–46.0)
Hemoglobin: 11.3 g/dL — ABNORMAL LOW (ref 12.0–15.0)
MCH: 26.7 pg (ref 26.0–34.0)
MCHC: 32.8 g/dL (ref 30.0–36.0)
MCV: 81.1 fL (ref 80.0–100.0)
Platelets: 232 10*3/uL (ref 150–400)
RBC: 4.24 MIL/uL (ref 3.87–5.11)
RDW: 13.5 % (ref 11.5–15.5)
WBC: 6.4 10*3/uL (ref 4.0–10.5)
nRBC: 0 % (ref 0.0–0.2)

## 2023-12-29 LAB — BASIC METABOLIC PANEL
Anion gap: 8 (ref 5–15)
BUN: 11 mg/dL (ref 6–20)
CO2: 27 mmol/L (ref 22–32)
Calcium: 9.9 mg/dL (ref 8.9–10.3)
Chloride: 103 mmol/L (ref 98–111)
Creatinine, Ser: 0.8 mg/dL (ref 0.44–1.00)
GFR, Estimated: >60 mL/min (ref >60)
Glucose, Bld: 87 mg/dL (ref 70–99)
Potassium: 3.4 mmol/L — ABNORMAL LOW (ref 3.5–5.1)
Sodium: 138 mmol/L (ref 135–145)

## 2023-12-29 NOTE — Progress Notes (Signed)
 PCP - No PCP Cardiologist - Denies  PPM/ICD - Denies Device Orders - n/a Rep Notified - n/a  Chest x-ray -n/a EKG - Denies Stress Test - Denies ECHO - Denies Cardiac Cath - Denies  Sleep Study - Denies CPAP - n/a  No DM  Last dose of GLP1 agonist- n/a GLP1 instructions: n/a  Blood Thinner Instructions: n/a Aspirin Instructions: n/a  ERAS Protcol - Clear liquids until 0500 morning of surgery PRE-SURGERY Ensure or G2- Ensure given to pt with instructions  COVID TEST- n/a   Anesthesia review: No.   Patient denies shortness of breath, fever, cough and chest pain at PAT appointment. Pt denies any respiratory illness/infection in the last two months.   All instructions explained to the patient, with a verbal understanding of the material. Patient agrees to go over the instructions while at home for a better understanding. Patient also instructed to self quarantine after being tested for COVID-19. The opportunity to ask questions was provided.

## 2023-12-31 ENCOUNTER — Other Ambulatory Visit: Payer: Self-pay | Admitting: General Surgery

## 2023-12-31 NOTE — Anesthesia Preprocedure Evaluation (Signed)
Anesthesia Evaluation  Patient identified by MRN, date of birth, ID band Patient awake    Reviewed: Allergy & Precautions, H&P , NPO status , Patient's Chart, lab work & pertinent test results  Airway Mallampati: I   Neck ROM: full    Dental  (+) Teeth Intact, Dental Advisory Given   Pulmonary Patient abstained from smoking., former smoker   breath sounds clear to auscultation       Cardiovascular  Rhythm:regular Rate:Normal  Echo 5/23  1. Left ventricular ejection fraction, by estimation, is 60 to 65%. Left ventricular ejection fraction by 3D volume is 64 %. The left ventricle has normal function. The left ventricle has no regional wall motion abnormalities. Left ventricular diastolic  parameters were normal. The average left ventricular global longitudinal strain is -21.8 %.  2. Right ventricular systolic function is normal. The right ventricular size is normal. There is normal pulmonary artery systolic pressure. The estimated right ventricular systolic pressure is 19.8 mmHg.  3. The mitral valve is normal in structure. Trivial mitral valve regurgitation. No evidence of mitral stenosis.  4. The aortic valve is normal in structure. Aortic valve regurgitation is not visualized. No aortic stenosis is present.  5. The inferior vena cava is normal in size with greater than 50% respiratory variability, suggesting right atrial pressure of 3 mmHg.   Neuro/Psych   Anxiety        GI/Hepatic   Endo/Other    Class 3 obesity  Renal/GU      Musculoskeletal  (+) Arthritis ,    Abdominal   Peds  Hematology   Anesthesia Other Findings Breast cancer  Reproductive/Obstetrics Breast CA                              Anesthesia Physical Anesthesia Plan  ASA: 2  Anesthesia Plan: General   Post-op Pain Management: Regional block*, Tylenol PO (pre-op)* and Celebrex PO (pre-op)*   Induction: Intravenous  PONV  Risk Score and Plan: 2 and Ondansetron, Dexamethasone, Midazolam and Treatment may vary due to age or medical condition  Airway Management Planned: Oral ETT  Additional Equipment: None  Intra-op Plan:   Post-operative Plan: Extubation in OR  Informed Consent: I have reviewed the patients History and Physical, chart, labs and discussed the procedure including the risks, benefits and alternatives for the proposed anesthesia with the patient or authorized representative who has indicated his/her understanding and acceptance.     Dental advisory given  Plan Discussed with: CRNA, Anesthesiologist and Surgeon  Anesthesia Plan Comments:          Anesthesia Quick Evaluation

## 2023-12-31 NOTE — H&P (Signed)
  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 The patient underwent a lumpectomy for a 4.5 cm tumor, with all margins greater than 10 mm. The patient's KI 67 was 40%. A sentinel node biopsy was performed twice, with the second biopsy revealing 0 out of 4 sentinel nodes were negative. Following the lumpectomy, the patient underwent chemotherapy and radiotherapy. The patient reports significant difficulty with chemotherapy.  The patient's recent mammogram shows C density breast tissue and a 32 mm mass in the region of the prior lumpectomy. All lymph nodes on the left side appear normal, and the right breast is unremarkable. A biopsy of the new mass reveals metaplastic cancer, that is triple negative and Ki 25%. Bone scan and ct c/a/p shows no metastatic disease.  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 She describes the pain as severe. The patient is currently taking Meloxicam for knee pain and an unspecified medication for anxiety. The patient reports a heightened fear of doctor visits following her cancer treatment.  Patient Active Problem List  Diagnosis  Malignant neoplasm of upper-outer quadrant of left breast in female, estrogen receptor negative (CMS/HHS-HCC)  Recurrent breast cancer, left (CMS/HHS-HCC)   Outpatient Medications Prior to Visit  Medication Sig Dispense Refill  gabapentin (NEURONTIN) 100 MG capsule Take 1 capsule (100 mg total) by mouth 3 (three) times daily as needed for up to 10 days 30 capsule 0  meloxicam (MOBIC) 15 MG tablet Take 1 tablet by mouth once daily  zolpidem (AMBIEN) 5 MG tablet Take 5 mg by mouth  cetirizine (ZYRTEC) 10 mg capsule Take by mouth (Patient not taking: Reported on 11/27/2023)  dicyclomine (BENTYL) 20 mg tablet Take 20 mg by mouth 2 (two) times daily (Patient not taking: Reported on  11/27/2023)  fluticasone propionate (FLONASE) 50 mcg/actuation nasal spray Place into one nostril (Patient not taking: Reported on 11/27/2023)  hydrOXYzine (ATARAX) 25 MG tablet Take by mouth (Patient not taking: Reported on 11/27/2023)  ibuprofen (MOTRIN) 800 MG tablet Take 800 mg by mouth 3 (three) times daily (Patient not taking: Reported on 11/27/2023)   PSH: left lump/ax sn biopsy, port PMH breast cancer, cholesterol  Objective   Vitals:   Physical Exam Vitals reviewed.  Constitutional:  Appearance: Normal appearance.  Chest:  Breasts: Right: No inverted nipple, mass or nipple discharge.  Left: Mass present. No inverted nipple or nipple discharge.  Comments: Radiotherapy changes left breast 3.5 - 4 cm hard mass near old incision Lymphadenopathy:  Upper Body:  Right upper body: No supraclavicular or axillary adenopathy.  Left upper body: No supraclavicular or axillary adenopathy.  Neurological:  Mental Status: She is alert   Assessment/Plan:   Assessment & Plan Breast Cancer Recurrence Left mastectomy, reattempt left ax sn biopsy  Patient with history of left lumpectomy for a 4.5 cm metaplastic triple negative tumor, now presents with a palpable mass in the region of the prior lumpectomy.  -discussed surgery today with risks and recovery

## 2024-01-01 ENCOUNTER — Ambulatory Visit (HOSPITAL_COMMUNITY): Payer: Self-pay

## 2024-01-01 ENCOUNTER — Ambulatory Visit (HOSPITAL_BASED_OUTPATIENT_CLINIC_OR_DEPARTMENT_OTHER): Payer: Commercial Managed Care - HMO

## 2024-01-01 ENCOUNTER — Encounter (HOSPITAL_COMMUNITY)
Admission: RE | Disposition: A | Discharge status: Home / Self Care | Payer: Self-pay | Source: Home / Self Care | Attending: General Surgery

## 2024-01-01 ENCOUNTER — Other Ambulatory Visit: Payer: Self-pay

## 2024-01-01 ENCOUNTER — Encounter (HOSPITAL_COMMUNITY): Payer: Self-pay | Admitting: General Surgery

## 2024-01-01 ENCOUNTER — Observation Stay (HOSPITAL_COMMUNITY)
Admission: RE | Admit: 2024-01-01 | Discharge: 2024-01-02 | Disposition: A | Discharge status: Home / Self Care | Payer: Commercial Managed Care - HMO | Attending: General Surgery | Admitting: General Surgery

## 2024-01-01 DIAGNOSIS — Z9012 Acquired absence of left breast and nipple: Principal | ICD-10-CM

## 2024-01-01 DIAGNOSIS — C50412 Malignant neoplasm of upper-outer quadrant of left female breast: Principal | ICD-10-CM | POA: Insufficient documentation

## 2024-01-01 DIAGNOSIS — C50912 Malignant neoplasm of unspecified site of left female breast: Secondary | ICD-10-CM

## 2024-01-01 DIAGNOSIS — Z01818 Encounter for other preprocedural examination: Secondary | ICD-10-CM

## 2024-01-01 DIAGNOSIS — Z171 Estrogen receptor negative status [ER-]: Secondary | ICD-10-CM | POA: Insufficient documentation

## 2024-01-01 HISTORY — PX: SENTINEL NODE BIOPSY: SHX6608

## 2024-01-01 HISTORY — PX: SIMPLE MASTECTOMY WITH AXILLARY SENTINEL NODE BIOPSY: SHX6098

## 2024-01-01 LAB — POCT PREGNANCY, URINE: Preg Test, Ur: NEGATIVE

## 2024-01-01 SURGERY — SIMPLE MASTECTOMY
Anesthesia: General | Site: Breast | Laterality: Left

## 2024-01-01 MED ORDER — EPHEDRINE 5 MG/ML INJ
INTRAVENOUS | Status: AC
  Filled 2024-01-01: qty 5

## 2024-01-01 MED ORDER — CEFAZOLIN SODIUM-DEXTROSE 2-4 GM/100ML-% IV SOLN: 2.0000 g | INTRAVENOUS | Status: DC

## 2024-01-01 MED ORDER — DROPERIDOL 2.5 MG/ML IJ SOLN: 0.6250 mg | Freq: Once | INTRAMUSCULAR | Status: DC | PRN

## 2024-01-01 MED ORDER — HEMOSTATIC AGENTS (NO CHARGE) OPTIME
TOPICAL | Status: DC | PRN
  Administered 2024-01-01 (×3): 1

## 2024-01-01 MED ORDER — LIDOCAINE 2% (20 MG/ML) 5 ML SYRINGE
INTRAMUSCULAR | Status: AC
  Filled 2024-01-01: qty 5

## 2024-01-01 MED ORDER — PROPOFOL 10 MG/ML IV BOLUS
INTRAVENOUS | Status: DC | PRN
Start: 2024-01-01 — End: 2024-01-01
  Administered 2024-01-01: 150 mg via INTRAVENOUS

## 2024-01-01 MED ORDER — ENSURE PRE-SURGERY PO LIQD: 296.0000 mL | Freq: Once | ORAL | Status: DC

## 2024-01-01 MED ORDER — FENTANYL CITRATE (PF) 100 MCG/2ML IJ SOLN
INTRAMUSCULAR | Status: AC
  Filled 2024-01-01: qty 2

## 2024-01-01 MED ORDER — ONDANSETRON HCL 4 MG/2ML IJ SOLN: 4.0000 mg | Freq: Four times a day (QID) | INTRAMUSCULAR | Status: DC | PRN

## 2024-01-01 MED ORDER — TRANEXAMIC ACID 1000 MG/10ML IV SOLN
2000.0000 mg | INTRAVENOUS | Status: DC
  Filled 2024-01-01: qty 20

## 2024-01-01 MED ORDER — ONDANSETRON HCL 4 MG/2ML IJ SOLN
INTRAMUSCULAR | Status: DC | PRN
  Administered 2024-01-01: 4 mg via INTRAVENOUS

## 2024-01-01 MED ORDER — PHENYLEPHRINE 80 MCG/ML (10ML) SYRINGE FOR IV PUSH (FOR BLOOD PRESSURE SUPPORT)
PREFILLED_SYRINGE | INTRAVENOUS | Status: AC
  Filled 2024-01-01: qty 10

## 2024-01-01 MED ORDER — MIDAZOLAM HCL 2 MG/2ML IJ SOLN
INTRAMUSCULAR | Status: AC
  Filled 2024-01-01: qty 2

## 2024-01-01 MED ORDER — METHOCARBAMOL 500 MG PO TABS
500.0000 mg | ORAL_TABLET | Freq: Four times a day (QID) | ORAL | Status: DC
  Administered 2024-01-01 – 2024-01-02 (×3): 500 mg via ORAL
  Filled 2024-01-01 (×3): qty 1

## 2024-01-01 MED ORDER — ONDANSETRON 4 MG PO TBDP: 4.0000 mg | ORAL_TABLET | Freq: Four times a day (QID) | ORAL | Status: DC | PRN

## 2024-01-01 MED ORDER — CHLORHEXIDINE GLUCONATE 0.12 % MT SOLN
OROMUCOSAL | Status: AC
  Administered 2024-01-01: 15 mL via OROMUCOSAL
  Filled 2024-01-01: qty 15

## 2024-01-01 MED ORDER — PHENYLEPHRINE 80 MCG/ML (10ML) SYRINGE FOR IV PUSH (FOR BLOOD PRESSURE SUPPORT)
PREFILLED_SYRINGE | INTRAVENOUS | Status: DC | PRN
  Administered 2024-01-01 (×4): 80 ug via INTRAVENOUS

## 2024-01-01 MED ORDER — CEFAZOLIN SODIUM-DEXTROSE 2-4 GM/100ML-% IV SOLN
2.0000 g | INTRAVENOUS | Status: AC
  Administered 2024-01-01: 2 g via INTRAVENOUS

## 2024-01-01 MED ORDER — CEFAZOLIN SODIUM-DEXTROSE 2-4 GM/100ML-% IV SOLN
INTRAVENOUS | Status: AC
Start: 2024-01-01 — End: 2024-01-01
  Filled 2024-01-01: qty 100

## 2024-01-01 MED ORDER — ACETAMINOPHEN 500 MG PO TABS
1000.0000 mg | ORAL_TABLET | Freq: Four times a day (QID) | ORAL | Status: DC
  Administered 2024-01-01 – 2024-01-02 (×3): 1000 mg via ORAL
  Filled 2024-01-01 (×3): qty 2

## 2024-01-01 MED ORDER — ORAL CARE MOUTH RINSE: 15.0000 mL | Freq: Once | OROMUCOSAL | Status: AC

## 2024-01-01 MED ORDER — ROCURONIUM BROMIDE 10 MG/ML (PF) SYRINGE
PREFILLED_SYRINGE | INTRAVENOUS | Status: DC | PRN
  Administered 2024-01-01: 60 mg via INTRAVENOUS

## 2024-01-01 MED ORDER — PROPOFOL 10 MG/ML IV BOLUS
INTRAVENOUS | Status: AC
  Filled 2024-01-01: qty 20

## 2024-01-01 MED ORDER — OXYCODONE HCL 5 MG/5ML PO SOLN: 5.0000 mg | Freq: Once | ORAL | Status: DC | PRN

## 2024-01-01 MED ORDER — DEXAMETHASONE SODIUM PHOSPHATE 10 MG/ML IJ SOLN
INTRAMUSCULAR | Status: DC | PRN
  Administered 2024-01-01: 10 mg via INTRAVENOUS

## 2024-01-01 MED ORDER — OXYCODONE HCL 5 MG PO TABS
5.0000 mg | ORAL_TABLET | ORAL | Status: DC | PRN
  Administered 2024-01-01: 10 mg via ORAL
  Administered 2024-01-02: 5 mg via ORAL
  Filled 2024-01-01: qty 1
  Filled 2024-01-01: qty 2

## 2024-01-01 MED ORDER — ONDANSETRON HCL 4 MG/2ML IJ SOLN
INTRAMUSCULAR | Status: AC
  Filled 2024-01-01: qty 2

## 2024-01-01 MED ORDER — OXYCODONE HCL 5 MG PO TABS: 5.0000 mg | ORAL_TABLET | Freq: Once | ORAL | Status: DC | PRN

## 2024-01-01 MED ORDER — BUPIVACAINE HCL (PF) 0.25 % IJ SOLN
INTRAMUSCULAR | Status: DC | PRN
  Administered 2024-01-01: 25 mL via EPIDURAL

## 2024-01-01 MED ORDER — FENTANYL CITRATE (PF) 100 MCG/2ML IJ SOLN
25.0000 ug | INTRAMUSCULAR | Status: DC | PRN
  Administered 2024-01-01 (×3): 50 ug via INTRAVENOUS

## 2024-01-01 MED ORDER — TRANEXAMIC ACID 1000 MG/10ML IV SOLN
INTRAVENOUS | Status: DC | PRN
  Administered 2024-01-01: 2000 mg via TOPICAL

## 2024-01-01 MED ORDER — CHLORHEXIDINE GLUCONATE CLOTH 2 % EX PADS: 6.0000 | MEDICATED_PAD | Freq: Once | CUTANEOUS | Status: DC

## 2024-01-01 MED ORDER — ACETAMINOPHEN 500 MG PO TABS
ORAL_TABLET | ORAL | Status: AC
  Administered 2024-01-01: 1000 mg via ORAL
  Filled 2024-01-01: qty 2

## 2024-01-01 MED ORDER — ACETAMINOPHEN 10 MG/ML IV SOLN: 1000.0000 mg | Freq: Once | INTRAVENOUS | Status: DC | PRN

## 2024-01-01 MED ORDER — CHLORHEXIDINE GLUCONATE 0.12 % MT SOLN: 15.0000 mL | Freq: Once | OROMUCOSAL | Status: AC

## 2024-01-01 MED ORDER — GABAPENTIN 300 MG PO CAPS: 300.0000 mg | ORAL_CAPSULE | Freq: Two times a day (BID) | ORAL | Status: DC | PRN

## 2024-01-01 MED ORDER — HYDROMORPHONE HCL 1 MG/ML IJ SOLN
0.5000 mg | INTRAMUSCULAR | Status: DC | PRN
  Filled 2024-01-01: qty 0.5

## 2024-01-01 MED ORDER — SIMETHICONE 80 MG PO CHEW: 40.0000 mg | CHEWABLE_TABLET | Freq: Four times a day (QID) | ORAL | Status: DC | PRN

## 2024-01-01 MED ORDER — MIDAZOLAM HCL 2 MG/2ML IJ SOLN
INTRAMUSCULAR | Status: DC | PRN
  Administered 2024-01-01: 2 mg via INTRAVENOUS

## 2024-01-01 MED ORDER — DEXTROSE IN LACTATED RINGERS 5 % IV SOLN: INTRAVENOUS | Status: DC

## 2024-01-01 MED ORDER — ALBUMIN HUMAN 5 % IV SOLN: INTRAVENOUS | Status: DC | PRN

## 2024-01-01 MED ORDER — FENTANYL CITRATE (PF) 250 MCG/5ML IJ SOLN
INTRAMUSCULAR | Status: DC | PRN
Start: 2024-01-01 — End: 2024-01-01
  Administered 2024-01-01 (×4): 50 ug via INTRAVENOUS

## 2024-01-01 MED ORDER — SUGAMMADEX SODIUM 200 MG/2ML IV SOLN
INTRAVENOUS | Status: DC | PRN
  Administered 2024-01-01: 200 mg via INTRAVENOUS

## 2024-01-01 MED ORDER — MAGTRACE LYMPHATIC TRACER
INTRAMUSCULAR | Status: DC | PRN
  Administered 2024-01-01: 1 mL via INTRAMUSCULAR

## 2024-01-01 MED ORDER — LACTATED RINGERS IV SOLN: INTRAVENOUS | Status: DC

## 2024-01-01 MED ORDER — ROCURONIUM BROMIDE 10 MG/ML (PF) SYRINGE
PREFILLED_SYRINGE | INTRAVENOUS | Status: AC
  Filled 2024-01-01: qty 10

## 2024-01-01 MED ORDER — ACETAMINOPHEN 500 MG PO TABS: 1000.0000 mg | ORAL_TABLET | ORAL | Status: AC

## 2024-01-01 MED ORDER — FENTANYL CITRATE (PF) 250 MCG/5ML IJ SOLN
INTRAMUSCULAR | Status: AC
  Filled 2024-01-01: qty 5

## 2024-01-01 MED ORDER — EPHEDRINE SULFATE-NACL 50-0.9 MG/10ML-% IV SOSY
PREFILLED_SYRINGE | INTRAVENOUS | Status: DC | PRN
  Administered 2024-01-01 (×3): 5 mg via INTRAVENOUS

## 2024-01-01 MED ORDER — METHOCARBAMOL 1000 MG/10ML IJ SOLN: 500.0000 mg | Freq: Four times a day (QID) | INTRAMUSCULAR | Status: DC

## 2024-01-01 MED ORDER — DEXAMETHASONE SODIUM PHOSPHATE 10 MG/ML IJ SOLN
INTRAMUSCULAR | Status: AC
  Filled 2024-01-01: qty 1

## 2024-01-01 MED ORDER — 0.9 % SODIUM CHLORIDE (POUR BTL) OPTIME
TOPICAL | Status: DC | PRN
  Administered 2024-01-01: 1000 mL

## 2024-01-01 SURGICAL SUPPLY — 41 items
APPLIER CLIP 9.375 MED OPEN (MISCELLANEOUS) ×1 IMPLANT
BAG COUNTER SPONGE SURGICOUNT (BAG) ×1 IMPLANT
BINDER BREAST LRG (GAUZE/BANDAGES/DRESSINGS) IMPLANT
BIOPATCH RED 1 DISK 7.0 (GAUZE/BANDAGES/DRESSINGS) ×1 IMPLANT
CHLORAPREP W/TINT 26 (MISCELLANEOUS) ×1 IMPLANT
CLIP APPLIE 9.375 MED OPEN (MISCELLANEOUS) ×1 IMPLANT
COVER SURGICAL LIGHT HANDLE (MISCELLANEOUS) ×1 IMPLANT
DERMABOND ADVANCED .7 DNX12 (GAUZE/BANDAGES/DRESSINGS) ×1 IMPLANT
DRAIN CHANNEL 19F RND (DRAIN) ×1 IMPLANT
DRAPE TOP ARMCOVERS (MISCELLANEOUS) ×1 IMPLANT
DRAPE U-SHAPE 76X120 STRL (DRAPES) ×1 IMPLANT
DRSG TEGADERM 4X10 (GAUZE/BANDAGES/DRESSINGS) IMPLANT
DRSG TEGADERM 4X4.75 (GAUZE/BANDAGES/DRESSINGS) ×1 IMPLANT
ELECT BLADE 4.0 EZ CLEAN MEGAD (MISCELLANEOUS) ×1 IMPLANT
ELECT CAUTERY BLADE 6.4 (BLADE) ×1 IMPLANT
ELECT REM PT RETURN 9FT ADLT (ELECTROSURGICAL) ×1 IMPLANT
ELECTRODE BLDE 4.0 EZ CLN MEGD (MISCELLANEOUS) ×1 IMPLANT
ELECTRODE REM PT RTRN 9FT ADLT (ELECTROSURGICAL) ×1 IMPLANT
EVACUATOR SILICONE 100CC (DRAIN) ×1 IMPLANT
GAUZE PAD ABD 8X10 STRL (GAUZE/BANDAGES/DRESSINGS) ×2 IMPLANT
GLOVE BIO SURGEON STRL SZ7 (GLOVE) ×1 IMPLANT
GLOVE BIOGEL PI IND STRL 7.5 (GLOVE) ×1 IMPLANT
GOWN STRL REUS W/ TWL LRG LVL3 (GOWN DISPOSABLE) ×3 IMPLANT
KIT BASIN OR (CUSTOM PROCEDURE TRAY) ×1 IMPLANT
KIT TURNOVER KIT B (KITS) ×1 IMPLANT
NS IRRIG 1000ML POUR BTL (IV SOLUTION) ×1 IMPLANT
PACK GENERAL/GYN (CUSTOM PROCEDURE TRAY) ×1 IMPLANT
PAD ARMBOARD 7.5X6 YLW CONV (MISCELLANEOUS) ×1 IMPLANT
PENCIL SMOKE EVACUATOR (MISCELLANEOUS) ×1 IMPLANT
POWDER SURGICEL 3.0 GRAM (HEMOSTASIS) IMPLANT
SPONGE T-LAP 18X18 ~~LOC~~+RFID (SPONGE) IMPLANT
STAPLER SKIN PROX WIDE 3.9 (STAPLE) IMPLANT
STRIP CLOSURE SKIN 1/2X4 (GAUZE/BANDAGES/DRESSINGS) ×1 IMPLANT
SUT ETHILON 2 0 FS 18 (SUTURE) ×1 IMPLANT
SUT MNCRL AB 4-0 PS2 18 (SUTURE) ×1 IMPLANT
SUT SILK 2 0 SH (SUTURE) ×1 IMPLANT
SUT VIC AB 2-0 SH 18 (SUTURE) ×1 IMPLANT
SUT VIC AB 3-0 SH 18 (SUTURE) ×1 IMPLANT
TOWEL GREEN STERILE (TOWEL DISPOSABLE) ×1 IMPLANT
TOWEL GREEN STERILE FF (TOWEL DISPOSABLE) ×1 IMPLANT
WATER STERILE IRR 1000ML POUR (IV SOLUTION) IMPLANT

## 2024-01-01 NOTE — Transfer of Care (Signed)
Immediate Anesthesia Transfer of Care Note  Patient: Gabrielle [name]  Procedure(s) Performed: LEFT MASTECTOMY (Left: Breast) LEFT AXILLARY SENTINEL NODE BIOPSY (Left: Breast)  Patient Location: PACU  Anesthesia Type:General  Level of Consciousness: awake, alert , and oriented  Airway & Oxygen Therapy: Patient Spontanous Breathing  Post-op Assessment: Report given to RN and Post -op Vital signs reviewed and stable  Post vital signs: Reviewed and stable  Last Vitals:  Vitals Value Taken Time  BP 103/66 01/01/24 1022  Temp 97.5   Pulse 77 01/01/24 1023  Resp 11 01/01/24 1023  SpO2 100 % 01/01/24 1023  Vitals shown include unfiled device data.  Last Pain:  Vitals:   01/01/24 0701  TempSrc:   PainSc: 3       Patients Stated Pain Goal: 3 (01/01/24 0701)  Complications: There were no known notable events for this encounter.

## 2024-01-01 NOTE — Interval H&P Note (Signed)
History and Physical Interval Note:  01/01/2024 7:56 AM  Gabrielle [name]  has presented today for surgery, with the diagnosis of LEFT BREAST CANCER.  The various methods of treatment have been discussed with the patient and family. After consideration of risks, benefits and other options for treatment, the patient has consented to  Procedure(s): LEFT MASTECTOMY (Left) LEFT AXILLARY SENTINEL NODE BIOPSY (Left) as a surgical intervention.  The patient's history has been reviewed, patient examined, no change in status, stable for surgery.  I have reviewed the patient's chart and labs.  Questions were answered to the patient's satisfaction.     [name]

## 2024-01-01 NOTE — Anesthesia Procedure Notes (Addendum)

## 2024-01-01 NOTE — Anesthesia Postprocedure Evaluation (Signed)
Anesthesia Post Note  Patient: Gabrielle [name]  Procedure(s) Performed: LEFT MASTECTOMY (Left: Breast) LEFT AXILLARY SENTINEL NODE BIOPSY (Left: Breast)     Patient location during evaluation: PACU Anesthesia Type: General and Regional Level of consciousness: awake and alert Pain management: pain level controlled Vital Signs Assessment: post-procedure vital signs reviewed and stable Respiratory status: spontaneous breathing, nonlabored ventilation, respiratory function stable and patient connected to nasal cannula oxygen Cardiovascular status: blood pressure returned to baseline and stable Postop Assessment: no apparent nausea or vomiting Anesthetic complications: no   There were no known notable events for this encounter.  Last Vitals:  Vitals:   01/01/24 1330 01/01/24 1421  BP: 99/63 102/74  Pulse: 63 60  Resp: 12 16  Temp: (!) 36.4 C (!) 36.4 C  SpO2: 100% 100%    Last Pain:  Vitals:   01/01/24 1421  TempSrc: Oral  PainSc:                  Nehalem Nation

## 2024-01-01 NOTE — Progress Notes (Signed)
Patient arrived to room. VSS. Family in room with patient. Alert and oriented X4.

## 2024-01-01 NOTE — Anesthesia Procedure Notes (Signed)
Anesthesia Regional Block: Pectoralis block   Pre-Anesthetic Checklist: , timeout performed,  Correct Patient, Correct Site, Correct Laterality,  Correct Procedure, Correct Position, site marked,  Risks and benefits discussed,  Surgical consent,  Pre-op evaluation,  At surgeon's request and post-op pain management  Laterality: Left  Prep: chloraprep       Needles:  Injection technique: Single-shot  Needle Type: Echogenic Stimulator Needle     Needle Length: 9cm  Needle Gauge: 21     Additional Needles:   Procedures:,,,, ultrasound used (permanent image in chart),,    Narrative:  Start time: 01/01/2024 8:23 AM End time: 01/01/2024 8:28 AM Injection made incrementally with aspirations every 5 mL.  Performed by: Personally  Anesthesiologist: Maiden Nation, MD  Additional Notes: Discussed risks and benefits of the nerve block in detail, including but not limited vascular injury, permanent nerve damage and infection.   Patient tolerated the procedure well. Local anesthetic introduced in an incremental fashion under minimal resistance after negative aspirations. No paresthesias were elicited. After completion of the procedure, no acute issues were identified and patient continued to be monitored by RN.

## 2024-01-01 NOTE — Op Note (Signed)
Preoperative diagnosis: Recurrent left breast triple negative metaplastic cancer Postoperative diagnosis: Same as above Procedure: 1.  Left mastectomy 2.  Injection of mag trace for sentinel node identification 3.  Attempted left axillary sentinel lymph node biopsy transition to low axillary dissection Surgeon Dr. Harden Mo  anesthesia: General With a pectoral block Specimens: 1.  Left breast tissue marked short superior, long lateral 2.  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  Her tumor was 4.5 cm previously with all margins greater than 10 mm.  Sentinel nodes were negative x 4.  She underwent chemotherapy and radiotherapy.  Since then she underwent a repeat mammogram which shows a 32 mm mass in the region of the prior lumpectomy.  Her nodes appear normal the right breast appears normal.  Biopsy shows a recurrent triple negative metaplastic cancer.  Bone scan and CT chest abdomen pelvis showed no metastatic disease.  We discussed proceeding with mastectomy in an attempt another sentinel lymph node biopsy.  Procedure: After informed consent was obtained she was taken to the operating room.  She was given antibiotics.  SCDs were in place.  She was placed under general anesthesia without complication.  She first underwent a pectoral block in the operating room.  She was then prepped and draped in a standard sterile surgical fashion.  A surgical timeout was then performed.  I injected 1 cc of mag trace in the subareolar position and 1 cc around the tumor.  I massaged this for 5 minutes.   The tumor and the prior incision were in the high left upper outer quadrant.  The tumor it actually appeared to have grown since I last saw  her.  I ended up making a large elliptical incision centered from the left upper inner quadrant to the left upper outer quadrant so that I can encompass the nipple areola her old scar and the tumor.  This was somewhat difficult given the radiotherapy changes to her skin which was quite thick.  I then was able to create flaps to the sternum, inframammary fold which I obliterated, latissimus and clavicle superiorly the tumor had a wide margin around it.  I was able to remove this from the pectoralis muscle to include the fascia.  This again was difficult due to her prior radiotherapy and surgery.  I suture-ligated multiple vessels during this with 3-0 Vicryl suture.  I then removed the breast tissue and marked it as above. I then was in the axilla.  She had a fair amount of scar.  There was no activity present in the axilla.  The sentinel node injection did not map.  I then elected to remove some of the lower actually tissue to give an idea of her nodes.  There were no palpable nodes present.  This really ends up being a low axillary dissection and this point there is not many tissue up towards the vein or on the chest wall.  I then passed these off the table.  I then obtained hemostasis.  I placed a TXA soaked sponge for 5 minutes.  I then reevaluated it and hemostasis was observed.  I then placed a 76 [place] Blake drain and secured this with a 2-0 nylon.  I then placed Surgicel powder throughout the wound.  I closed this  with 3-0 Vicryl.  The inferior most portion I remove the dogear and closed this.  I then closed the skin with 4 Monocryl glue and Steri-Strips.  She tolerated this well was extubated and transferred recovery stable.

## 2024-01-01 NOTE — Discharge Instructions (Signed)
[facility], [place] [phone]  MASTECTOMY: POST OP INSTRUCTIONS Take 400 mg of ibuprofen every 8 hours or 650 mg tylenol every 6 hours for next 72 hours then as needed.   A prescription for pain medication may be given to you upon discharge.  Take your pain medication as prescribed, if needed.  If narcotic pain medicine is not needed, then you may take acetaminophen (Tylenol), naprosyn (Alleve) or ibuprofen (Advil) as needed. Take your usually prescribed medications unless otherwise directed. If you need a refill on your pain medication, please contact your pharmacy.  They will contact our office to request authorization.  Prescriptions will not be filled after 5pm or on week-ends. You should follow a light diet the first 24 hours after surgery.  Resume your normal diet the day after surgery. Most patients will experience some swelling and bruising on the chest and underarm.  Ice packs to the underarm will help.  Swelling and bruising can take several days to resolve. Wear the binder or [place]irie bra for 72 hours day and night. After that please wear during the day.  It is common to experience some constipation if taking pain medication after surgery.  Increasing fluid intake and taking a stool softener (such as Colace) will usually help or prevent this problem from occurring.  A mild laxative (Milk of Magnesia or Miralax) should be taken according to package instructions if there are no bowel movements after 48 hours. There is glue and steristrips on your incision. They will come off in the next few weeks.  You may take a shower 48 hours after surgery.  Any sutures will be removed at an office visit DRAINS:  If you have drains in place, it is important to keep a list of the amount of drainage produced each day in your drains.  Before leaving the hospital, you should be instructed on drain care.  Call our office if you have any questions about your drains. I will remove your drains when  they put out less than 30 cc or ml for 2 consecutive days. ACTIVITIES:  You may resume regular (light) daily activities beginning the next day--such as daily self-care, walking, climbing stairs--gradually increasing activities as tolerated.  You may have sexual intercourse when it is comfortable.  Refrain from any heavy lifting or straining until approved by your doctor. You may drive when you are no longer taking prescription pain medication, you can comfortably wear a seatbelt, and you can safely maneuver your car and apply brakes. RETURN TO WORK:  __________________________________________________________ Bonita Quin should see your doctor in the office for a follow-up appointment approximately 3-5 days after your surgery.  Your doctor's nurse will typically make your follow-up appointment when she calls you with your pathology report.  Expect your pathology report 3-4business days after surgery. OTHER INSTRUCTIONS: ______________________________________________________________________________________________ ____________________________________________________________________________________________ WHEN TO CALL YOUR DR Reem Fleury: Fever over 101.0 Nausea and/or vomiting Extreme swelling or bruising Continued bleeding from incision. Increased pain, redness, or drainage from the incision. The clinic staff is available to answer your questions during regular business hours.  Please don't hesitate to call and ask to speak to one of the nurses for clinical concerns.  If you have a medical emergency, go to the nearest emergency room or call 911.  A surgeon from [facility] is always on call at the hospital. [address], [place], [place]  [address] ? [address] [address], [place], [place]   [address] [phone] ? [phone] ? FAX [phone]  Web site: [email or web address]

## 2024-01-01 NOTE — Plan of Care (Signed)
Problem: Activity: Goal: Risk for activity intolerance will decrease Outcome: Progressing   Problem: Nutrition: Goal: Adequate nutrition will be maintained Outcome: Progressing   Problem: Coping: Goal: Level of anxiety will decrease Outcome: Progressing   Problem: Safety: Goal: Ability to remain free from injury will improve Outcome: Progressing

## 2024-01-02 ENCOUNTER — Encounter (HOSPITAL_COMMUNITY): Payer: Self-pay | Admitting: General Surgery

## 2024-01-02 DIAGNOSIS — C50412 Malignant neoplasm of upper-outer quadrant of left female breast: Secondary | ICD-10-CM | POA: Diagnosis not present

## 2024-01-02 MED ORDER — OXYCODONE HCL 5 MG PO TABS
5.0000 mg | ORAL_TABLET | ORAL | 0 refills | Status: DC | PRN
Start: 2024-01-02 — End: 2024-04-05

## 2024-01-02 MED ORDER — METHOCARBAMOL 500 MG PO TABS: 500.0000 mg | ORAL_TABLET | Freq: Four times a day (QID) | ORAL | 0 refills | Status: DC

## 2024-01-02 NOTE — Discharge Summary (Signed)
Physician Discharge Summary  Patient ID: Gabrielle [name] MRN: [id number] DOB/AGE: 1991/11/09 33 y.o.  Admit date: 01/01/2024 Discharge date: 01/02/2024  Admission Diagnoses: Recurrent left tn metaplastic breast cancer  Discharge Diagnoses:  Principal Problem:   S/P left mastectomy   Discharged Condition: good  Hospital Course: 55 yof underwent left mastectomy and low axillary node excision after failed sn. Doing well following am and ready for dc  Consults: None  Significant Diagnostic Studies: none   Discharge Exam: Blood pressure 108/69, pulse 71, temperature 98.2 F (36.8 C), temperature source Oral, resp. rate 16, height 5\' 9"  (1.753 m), weight 75.8 kg, last menstrual period 12/24/2023, SpO2 100%. Drain as expected, no output recorded by nursing, no hematoma, flaps viable  Disposition: Discharge disposition: 01-Home or Self Care        Allergies as of 01/02/2024   No Known Allergies      Medication List     TAKE these medications    gabapentin 300 MG capsule Commonly known as: NEURONTIN Take 1 capsule (300 mg total) by mouth in the morning and at bedtime. What changed:  when to take this reasons to take this   LORazepam 1 MG tablet Commonly known as: ATIVAN Take 1 tablet (1 mg total) by mouth every 8 (eight) hours as needed for anxiety. Take 1 tab 1 hour before CT scan or breast biopsy, if not enough, OK to take second dose before CT   meloxicam 15 MG tablet Commonly known as: MOBIC Take 1 tablet (15 mg total) by mouth daily. What changed:  when to take this reasons to take this   methocarbamol 500 MG tablet Commonly known as: ROBAXIN Take 1 tablet (500 mg total) by mouth 4 (four) times daily.   oxyCODONE 5 MG immediate release tablet Commonly known as: Oxy IR/ROXICODONE Take 1-2 tablets (5-10 mg total) by mouth every 4 (four) hours as needed for moderate pain (pain score 4-6).        Follow-up Information     [name], MD Follow  up in 3 week(s).   Specialty: General Surgery Contact information: [address] [place] [address] [phone]                 Signed: [name] 01/02/2024, 8:18 AM

## 2024-01-02 NOTE — Plan of Care (Signed)
  Problem: Pain Managment: Goal: General experience of comfort will improve and/or be controlled Outcome: Progressing   Problem: Safety: Goal: Ability to remain free from injury will improve Outcome: Progressing

## 2024-01-02 NOTE — Progress Notes (Signed)
Patient has been discharged per MD. Removed IV, site is clean and intact. Patient tolerated well. Education provided on emptying drain at home and measuring/recording output. Reviewed discharge summary with patient and Mother Telisa. Patient is awaiting to be taken down to discharge lounge.

## 2024-01-06 ENCOUNTER — Encounter: Payer: Self-pay | Admitting: *Deleted

## 2024-01-07 LAB — SURGICAL PATHOLOGY

## 2024-01-11 NOTE — Progress Notes (Unsigned)
 Patient Care Team: Patient, [name] as PCP - General (General Practice) Gabrielle Angelica, RN as Oncology Nurse Navigator Gabrielle Proud, RN as Oncology Nurse Navigator Gabrielle Loron, MD as Consulting Physician (General Surgery) Gabrielle Mood, MD as Consulting Physician (Hematology) Gabrielle Puffer, MD as Consulting Physician (Radiation Oncology) Gabrielle Gosselin, MD as Consulting Physician (Orthopedic Surgery) Gabrielle [name] as Physician Assistant (Hematology and Oncology) Imaging, [facility] as Radiologist (Diagnostic Radiology)  Clinic Day:  01/12/2024  Referring physician: Norm Salt, PA  ASSESSMENT & PLAN:   Assessment & Plan: Malignant neoplasm of upper-outer quadrant of left breast in female, 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 SLN biopsy on 05/14/22 was negative (0/4). -she started zoladex on 06/03/22. -she began chemotherapy with carboplatin/taxol and Keytruda on 06/14/22. Taxol switched to abraxane with C2D8 due to itching and mild numbness. 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 -she switched to [facility] with continuing Keytruda on 09/19/22. She tolerated poorly with persistent N/V, fatigue and anorexia, so treatment was stopped after one cycle, I encourage her to continue Keytruda for one year but she declined  -S/p radiation 01/16/23 - 03/06/2023 -she had local recurrence of metaplastic carcinoma in 11/2023, plan for mastectomy. -staging bone scan is negative, pt has been very resistant for CT, -CT CAP done 12/18/2023 was negative for convincing evidence of metastatic disease in the chest, abdomen, or pelvis. -She underwent left mastectomy on 01/01/2024 per Dr.  Dwain [name]. -- Pathology consistent with pT3 N0 metaplastic carcinoma with extensive necrosis.  It was negative for angiolymphatic invasion.  Tumor was ER negative, PR negative, HER2 negative, Ki-67 25%.  There were 5 benign lymph nodes removed. -Discussed risks versus benefits of pursuing treatment with Keytruda every 3 weeks for 1 year with the patient.  She is very reluctant as she had poor tolerance of any chemotherapy. -Will schedule Signatera testing in her home in next 3 to 4 weeks.  Will repeat testing every 3 to 4 months with routine labs and surveillance.    Bruising of left mastectomy site Expected finding.  Possibly new keloid formation but unsure at this point.  She does have follow up scheduled with surgery on Friday 01/16/2024 for continued evaluation.  Poor tolerance chemotherapy Patient has history of poor tolerance with multiple chemotherapy regimens.,  Patient on Cytoxan and Taxol along with Keytruda.  Stopped after cycle 2 due to itching and mild numbness.  Did trial of Adriamycin and Cytoxan along with Keytruda.  Stopped after single cycle due to N/V, fatigue, and anorexia.  It has been recommended that she have adjuvant immunotherapy with Keytruda every 3 weeks for 1 year.  Very reluctant to try this as she associates negative side effects she experienced with getting IV medications.  Discussed the difference between chemotherapy and immunotherapy.  She is still very reluctant to try and would like to think about it for some time.  Port-A-Cath malfunction Patient reports last several treatments she did have, nurses had a very difficult time accessing her port.  Would take several sticks before they would get blood return.  She states the last time she had treatment, she stopped allowing them to access her port and has not had port flushed or accessed since then.  She states  it has been several months since the last time.  She does she declines to have her port accessed right now.   Did not have port removed during surgery.  She reports that Dr. Dwain [name] was reluctant to remove without agreement with Dr. Mosetta [name].  The patient was encouraged to talk with Dr. Dwain [name] on Friday, 01/16/2024, regarding port removal since she really does not want to have it use any longer.  Anxiety Patient understands that some of her side effects and symptoms may be due to generalized anxiety.  Does have prescription for lorazepam which she uses only once prior to CT CAP for evaluation of metastatic disease.  She states it really did not help.  Has not used it since then.  The patient is agreeable to have social worker contact her to discuss possible referral to psychologist or psychiatrist for further management of anxiety.  Plan: Patient seen along with Dr. Mosetta [name] today. Reviewed pathology from recent mastectomy. Recommend adjuvant treatment with Keytruda every 3 weeks for 1 year.  Patient is currently reluctant to try this as she has had negative effects from chemotherapy in the past.  Long discussion about difference between chemotherapy and immunotherapy.  Patient would like to think about this for a while. Signatera testing to be arranged in the next 3 to 4 weeks at patient's home.  If initial test positive, will want to revisit treatment with [facility] as she may truly benefit from this medication, preventing recurrent or metastatic disease.  If negative, will plan to repeat every 3 to 4 months with labs and routine surveillance visits. Follow-up visit scheduled with Dr. Dwain [name], surgeon, on 01/16/2024.  Advised her to discuss surgical site bruising and port removal with him. Will plan to see the patient back with labs and follow-up in 3 months.  I did remind her that she could contact the clinic at any point prior to then if she decided to go forward with [facility] treatment, or if she has other concerns. The patient understands the plans discussed today and is in agreement with them.  She knows to  contact our office if she develops concerns prior to her next appointment.  I provided 30 minutes of face-to-face time during this encounter and > 50% was spent counseling as documented under my assessment and plan.    Carlean Jews, NP  [facility] - A DEPT OF [facility] [address] [place] [address] Dept: [phone] Dept Fax: [phone]   No orders of the defined types were placed in this encounter.     CHIEF COMPLAINT:  CC: Left breast cancer, triple negative  Current Treatment: Mastectomy  INTERVAL HISTORY:  Gabrielle is here today for repeat clinical assessment.  She was last seen by Dr. Mosetta [name] on 12/04/2023. CT scan of the chest abdomen pelvis done 12/18/2023. It showed no convincing evidence of metastatic disease in the chest, abdomen, or pelvis.  She had left mastectomy on 01/01/2024.  Pathology results were consistent with pT3 N0 metaplastic carcinoma with extensive necrosis.  It is negative for angiolymphatic invasion.  The tumor is ER negative, PR negative, HER2 negative, Ki-67 of 25%.  The patient declines further treatment with Keytruda at this time due to poor tolerance of chemotherapy in the past.  She does not plan to have reconstructive surgery for the left breast.  She reports doing well since surgery.  States she has no pain.  Surgical drain pulled last week  by surgery.  Follow-up with surgery scheduled on 01/16/2024.  She denies fevers or chills. She denies pain. Her appetite is good.  She reports appetite has improved significantly since her surgery.  Her weight has decreased 8 pounds over last 2 weeks .  I have reviewed the past medical history, past surgical history, social history and family history with the patient and they are unchanged from previous note.  ALLERGIES:  has no known allergies.  MEDICATIONS:  Current Outpatient Medications  Medication Sig Dispense Refill   gabapentin  (NEURONTIN) 300 MG capsule Take 1 capsule (300 mg total) by mouth in the morning and at bedtime. (Patient taking differently: Take 300 mg by mouth 2 (two) times daily as needed (leg pain.).) 180 capsule 1   LORazepam (ATIVAN) 1 MG tablet Take 1 tablet (1 mg total) by mouth every 8 (eight) hours as needed for anxiety. Take 1 tab 1 hour before CT scan or breast biopsy, if not enough, OK to take second dose before CT 30 tablet 0   meloxicam (MOBIC) 15 MG tablet Take 1 tablet (15 mg total) by mouth daily. (Patient taking differently: Take 15 mg by mouth daily as needed (knee pain.).) 90 tablet 1   methocarbamol (ROBAXIN) 500 MG tablet Take 1 tablet (500 mg total) by mouth 4 (four) times daily. 40 tablet 0   oxyCODONE (OXY IR/ROXICODONE) 5 MG immediate release tablet Take 1-2 tablets (5-10 mg total) by mouth every 4 (four) hours as needed for moderate pain (pain score 4-6). (Patient not taking: Reported on 01/12/2024) 20 tablet 0   No current facility-administered medications for this visit.   Facility-Administered Medications Ordered in Other Visits  Medication Dose Route Frequency Provider Last Rate Last Admin   diphenhydrAMINE (BENADRYL) 50 MG/ML injection            famotidine (PEPCID) 20-0.9 MG/50ML-% IVPB             HISTORY OF PRESENT ILLNESS:   Oncology History Overview Note   Cancer Staging  Malignant neoplasm of upper-outer quadrant of left breast in female, estrogen receptor negative (HCC) Staging form: Breast, AJCC 8th Edition - Clinical stage from 03/11/2022: Stage IIB (cT2, cN0, cM0, G3, ER-, PR-, HER2-) - Signed by Gabrielle Mood, MD on 03/19/2022    Malignant neoplasm of upper-outer quadrant of left breast in female, estrogen receptor negative (HCC)  03/08/2022 Mammogram   CLINICAL DATA:  33 year old female presenting for evaluation of a palpable lump in the left breast which she feels has increased in size since she first identified it. Her mother was diagnosed with breast cancer within  the last 6 months. She also has family history of breast cancer in multiple aunts and her maternal grandmother.   EXAM: DIGITAL DIAGNOSTIC BILATERAL MAMMOGRAM WITH TOMOSYNTHESIS AND CAD; ULTRASOUND LEFT BREAST LIMITED  IMPRESSION: 1. There is a suspicious 3.7 cm mass in the left breast at 12 o'clock.   2.  No evidence of left axillary lymphadenopathy.   3.  No evidence of malignancy in the right breast.   03/11/2022 Cancer Staging   Staging form: Breast, AJCC 8th Edition - Clinical stage from 03/11/2022: Stage IIB (cT2, cN0, cM0, G3, ER-, PR-, HER2-) - Signed by Gabrielle Mood, MD on 03/19/2022 Stage prefix: Initial diagnosis Histologic grading system: 3 grade system   03/11/2022 Initial Biopsy   Diagnosis Breast, left, needle core biopsy, 12:00 - METAPLASTIC CARCINOMA - SEE COMMENT  Microscopic Comment The biopsy has an invasive epithelial component admixed with chondroid deposition, consistent  with a metaplastic carcinoma. Based on the biopsy, the carcinoma appears Nottingham grade 3 of 3 and measures 1.2 cm in greatest linear extent.  PROGNOSTIC INDICATORS Results: The tumor cells are NEGATIVE for Her2 (0). Estrogen Receptor: 0%, NEGATIVE Progesterone Receptor: 0%, NEGATIVE Proliferation Marker Ki67: 40%   03/15/2022 Initial Diagnosis   Malignant neoplasm of upper-outer quadrant of left breast in female, estrogen receptor negative (HCC)   03/28/2022 Genetic Testing   Negative hereditary cancer genetic testing: no pathogenic variants detected in [facility] BRCAPlus Panel or [employer] CustomNext-Cancer +RNAinsight Panel.  Report dates are 03/28/2022 and 03/31/2022. Marland Kitchen   The BRCAplus panel offered by [employer] and includes sequencing and deletion/duplication analysis for the following 8 genes: ATM, BRCA1, BRCA2, CDH1, CHEK2, PALB2, PTEN, and TP53.  The CustomNext-Cancer+RNAinsight panel offered by [employer] includes sequencing and rearrangement analysis for the following 47 genes:   APC, ATM, AXIN2, BARD1, BMPR1A, BRCA1, BRCA2, BRIP1, CDH1, CDK4, CDKN2A, CHEK2, DICER1, EPCAM, GREM1, HOXB13, MEN1, MLH1, MSH2, MSH3, MSH6, MUTYH, NBN, NF1, NF2, NTHL1, PALB2, PMS2, POLD1, POLE, PTEN, RAD51C, RAD51D, RECQL, RET, SDHA, SDHAF2, SDHB, SDHC, SDHD, SMAD4, SMARCA4, STK11, TP53, TSC1, TSC2, and VHL.  RNA data is routinely analyzed for use in variant interpretation for all genes.   05/14/2022 Cancer Staging   Staging form: Breast, AJCC 8th Edition - Pathologic stage from 05/14/2022: Stage IIA (pT2, pN0, cM0, G3, ER-, PR-, HER2-) - Signed by Gabrielle Mood, MD on 05/28/2022 Stage prefix: Initial diagnosis Histologic grading system: 3 grade system Residual tumor (R): R0 - None   06/13/2022 -  Chemotherapy   Patient is on Treatment Plan : BREAST Pembrolizumab (200) D1 + Carboplatin (1.5) D1,8,15 + Paclitaxel (80) D1,8,15 q21d X 4 cycles / Pembrolizumab (200) D1 + AC D1 q21d x 4 cycles     06/14/2022 - 07/05/2022 Chemotherapy   Patient is on Treatment Plan : BREAST Pembrolizumab (200) D1 + Carboplatin (5) D1 + Paclitaxel (80) D1,8,15 q21d X 4 cycles / Pembrolizumab (200) D1 + AC D1 q21d x 4 cycles     12/18/2023 Imaging   CT chest, abdomen, and pelvis with contrast  IMPRESSION: 1. Skin thickening of the left breast with multiple calcifications and asymmetric soft tissue, compatible with known left breast  malignancy. 2. No convincing evidence of metastatic disease in the chest, abdomen or pelvis. 3. Crescentic soft tissue in the anterior mediastinum which conforms to underlying vasculature, favored to reflect thymic tissue suggest attention on follow-up imaging. 4. Cholelithiasis without findings of acute cholecystitis.   01/01/2024 Surgery   Left mastectomy - Dr. Dwain [name]       REVIEW OF SYSTEMS:   Constitutional: Denies fevers, chills or abnormal weight loss Eyes: Denies blurriness of vision Ears, nose, mouth, throat, and face: Denies mucositis or sore throat Respiratory: Denies cough,  dyspnea or wheezes Cardiovascular: Denies palpitation, chest discomfort or lower extremity swelling Gastrointestinal:  Denies nausea, heartburn or change in bowel habits Skin: Denies abnormal skin rashes Lymphatics: Denies new lymphadenopathy or easy bruising Neurological:Denies numbness, tingling or new weaknesses Behavioral/Psych: [name] is stable, no new changes  All other systems were reviewed with the patient and are negative.   VITALS:   Today's Vitals   01/12/24 1336  BP: 96/60  Pulse: 91  Resp: 18  Temp: 98.3 F (36.8 C)  TempSrc: Temporal  SpO2: 96%  Weight: 159 lb 12.8 oz (72.5 kg)  PainSc: 0-No pain   Body mass index is 23.6 kg/m.   Wt Readings from Last 3 Encounters:  01/12/24  159 lb 12.8 oz (72.5 kg)  01/01/24 167 lb 1.6 oz (75.8 kg)  12/29/23 167 lb 1.6 oz (75.8 kg)    Body mass index is 23.6 kg/m.  Performance status (ECOG): 1 - Symptomatic but completely ambulatory  PHYSICAL EXAM:   GENERAL:alert, no distress and comfortable SKIN: skin color, texture, turgor are normal, no rashes or significant lesions EYES: normal, Conjunctiva are pink and non-injected, sclera clear OROPHARYNX:no exudate, no erythema and lips, buccal mucosa, and tongue normal  NECK: supple, thyroid normal size, non-tender, without nodularity LYMPH:  no palpable lymphadenopathy in the cervical, axillary or inguinal LUNGS: clear to auscultation and percussion with normal breathing effort HEART: regular rate & rhythm and no murmurs and no lower extremity edema ABDOMEN:abdomen soft, non-tender and normal bowel sounds Musculoskeletal:no cyanosis of digits and no clubbing  NEURO: alert & oriented x 3 with fluent speech, no focal motor/sensory deficits BREAST: The right breast is without palpable mass.  There is no nipple inversion or nipple discharge.  There is no axillary lymphadenopathy on the right side.  New mastectomy scar to left chest wall, just under axillary line.  Some bruising and  possibly keloid formation is noted.  Nontender.  No axillary lymphadenopathy on the left.  Scar from surgical drain currently covered in clean and dry dressing.  LABORATORY DATA:  I have reviewed the data as listed    Component Value Date/Time   NA 138 12/29/2023 0837   K 3.4 (L) 12/29/2023 0837   CL 103 12/29/2023 0837   CO2 27 12/29/2023 0837   GLUCOSE 87 12/29/2023 0837   BUN 11 12/29/2023 0837   CREATININE 0.80 12/29/2023 0837   CREATININE 0.68 04/10/2023 1021   CALCIUM 9.9 12/29/2023 0837   CALCIUM NSERUM 10/24/2022 1114   PROT 6.8 10/30/2023 0940   ALBUMIN 3.9 10/30/2023 0940   AST 21 10/30/2023 0940   AST 28 10/24/2022 1114   ALT 8 10/30/2023 0940   ALT 19 10/24/2022 1114   ALKPHOS 95 10/30/2023 0940   BILITOT 0.4 10/30/2023 0940   BILITOT 0.4 10/24/2022 1114   GFRNONAA >60 12/29/2023 0837   GFRNONAA >60 10/24/2022 1114    Lab Results  Component Value Date   WBC 6.4 12/29/2023   NEUTROABS 1.9 10/30/2023   HGB 11.3 (L) 12/29/2023   HCT 34.4 (L) 12/29/2023   MCV 81.1 12/29/2023   PLT 232 12/29/2023    RADIOGRAPHIC STUDIES:CT CHEST ABDOMEN PELVIS W CONTRAST Result Date: 12/18/2023 CLINICAL DATA:  Malignant neoplasm of upper-outer quadrant of left breast in female, estrogen receptor negative, weight loss, rule out recurrence * Tracking Code: BO * EXAM: CT CHEST, ABDOMEN, AND PELVIS WITH CONTRAST TECHNIQUE: Multidetector CT imaging of the chest, abdomen and pelvis was performed following the standard protocol during bolus administration of intravenous contrast. RADIATION DOSE REDUCTION: This exam was performed according to the departmental dose-optimization program which includes automated exposure control, adjustment of the mA and/or kV according to patient size and/or use of iterative reconstruction technique. CONTRAST:  OMNIPAQUE IOHEXOL 300 MG/ML  SOLN COMPARISON:  Multiple priors including bone scan 12/11/2023 and CT Mar 26 2022. FINDINGS: CT CHEST FINDINGS  Cardiovascular: Right chest port with tip in the SVC. No significant vascular findings. Normal heart size. No pericardial effusion. Mediastinum/Nodes: Crescentic soft tissue in the anterior mediastinum which conforms to underlying vasculature for instance on image 25/2. Left axillary surgical clips. No pathologically enlarged mediastinal, hilar or axillary lymph nodes. Patulous esophagus. Lungs/Pleura: No suspicious pulmonary nodules or masses. No pleural  effusion. No pneumothorax. Musculoskeletal: Skin thickening of the left breast with multiple calcifications and asymmetric soft tissue. 3.9 x 2.8 cm mass in the left breast on image 34/2. No aggressive lytic or blastic lesion of bone. CT ABDOMEN PELVIS FINDINGS Hepatobiliary: No aggressive lytic or blastic lesion of bone. Cholelithiasis without findings of acute cholecystitis. No biliary ductal dilation. Pancreas: No pancreatic ductal dilation or evidence of acute inflammation. Spleen: No splenomegaly or focal splenic lesion. Adrenals/Urinary Tract: Bilateral adrenal glands appear normal. No hydronephrosis. Kidneys demonstrate symmetric enhancement. Urinary bladder is unremarkable for degree of distension. Stomach/Bowel: Stomach is unremarkable for degree of distension. No pathologic dilation of small or large bowel. No evidence of acute bowel inflammation. Vascular/Lymphatic: Normal caliber abdominal aorta. Smooth IVC contours. No pathologically enlarged abdominal or pelvic lymph nodes. Reproductive: Corpus luteal cyst in the bilateral ovaries are benign/physiologic and requiring no independent imaging follow-up. Uterus is unremarkable. Other: Small volume pelvic free fluid is within physiologic normal limits. Musculoskeletal: No aggressive lytic or blastic lesion of bone. IMPRESSION: 1. Skin thickening of the left breast with multiple calcifications and asymmetric soft tissue, compatible with known left breast malignancy. 2. No convincing evidence of metastatic  disease in the chest, abdomen or pelvis. 3. Crescentic soft tissue in the anterior mediastinum which conforms to underlying vasculature, favored to reflect thymic tissue suggest attention on follow-up imaging. 4. Cholelithiasis without findings of acute cholecystitis. Electronically Signed   By: Maudry Mayhew M.D.   On: 12/18/2023 17:53    Addendum I have seen the patient, examined her. I agree with the assessment and and plan and have edited the notes.   Ms [name] has recovered well from left mastectomy surgery.  I reviewed her surgical path, which showed a 6.2 cm metaplastic carcinoma with extensive necrosis, margins were negative, all 5 lymph nodes were negative, triple negative.  This is the local recurrence of her previous breast cancer.  She previously has received adjuvant radiation.  She previously tolerated chemotherapy poorly, and did not finish adjuvant immunotherapy.  I again encouraged her to complete the rest of Keytruda, she declined.  We discussed cancer surveillance, she is agreeable.  Will order Signatera in the next 3 to 4 weeks, and continue every 4 to 6 months for surveillance.  All questions were answered.  I spent a total of 25 minutes for her visit today, more than 50% time on face-to-face counseling.  Gabrielle Mood MD 01/12/2024

## 2024-01-11 NOTE — Assessment & Plan Note (Signed)
 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 SLN biopsy on 05/14/22 was negative (0/4). -she started zoladex on 06/03/22. -she began chemotherapy with carboplatin/taxol and Keytruda on 06/14/22. Taxol switched to abraxane with C2D8 due to itching and mild numbness. 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 -she switched to [facility] with continuing Keytruda on 09/19/22. She tolerated poorly with persistent N/V, fatigue and anorexia, so treatment was stopped after one cycle, I encourage her to continue Keytruda for one year but she declined  -S/p radiation 01/16/23 - 03/06/2023 -she had local recurrence of metaplastic carcinoma in 11/2023, plan for mastectomy. -staging bone scan is negative, pt has been very resistant for CT, -CT CAP done 12/18/2023 was negative for convincing evidence of metastatic disease in the chest, abdomen, or pelvis. -She underwent left mastectomy on 01/01/2024 per Dr. Dwain Sarna. -- Pathology consistent with pT3 N0 metaplastic carcinoma with extensive necrosis.  It was negative for angiolymphatic invasion.  Tumor was ER negative, PR negative, HER2 negative, Ki-67 25%.  There were 5 benign lymph nodes removed. -Discussed risks versus benefits of pursuing treatment with Keytruda every 3 weeks for 1 year with the patient.  She is very reluctant as she had poor tolerance of any chemotherapy. -Will schedule Signatera testing in her home in next 3 to 4 weeks.  Will repeat testing every 3 to 4 months with routine labs and surveillance.

## 2024-01-12 ENCOUNTER — Telehealth: Payer: Self-pay | Admitting: Nurse Practitioner

## 2024-01-12 ENCOUNTER — Inpatient Hospital Stay: Payer: Commercial Managed Care - HMO | Attending: Hematology | Admitting: Nurse Practitioner

## 2024-01-12 VITALS — BP 96/60 | HR 91 | Temp 98.3°F | Resp 18 | Wt 159.8 lb

## 2024-01-12 DIAGNOSIS — C50412 Malignant neoplasm of upper-outer quadrant of left female breast: Secondary | ICD-10-CM | POA: Diagnosis present

## 2024-01-12 DIAGNOSIS — Z923 Personal history of irradiation: Secondary | ICD-10-CM | POA: Insufficient documentation

## 2024-01-12 DIAGNOSIS — Z171 Estrogen receptor negative status [ER-]: Secondary | ICD-10-CM | POA: Insufficient documentation

## 2024-01-12 DIAGNOSIS — Z9221 Personal history of antineoplastic chemotherapy: Secondary | ICD-10-CM | POA: Diagnosis not present

## 2024-01-12 DIAGNOSIS — Z1732 Human epidermal growth factor receptor 2 negative status: Secondary | ICD-10-CM | POA: Diagnosis not present

## 2024-01-12 DIAGNOSIS — Z1722 Progesterone receptor negative status: Secondary | ICD-10-CM | POA: Diagnosis not present

## 2024-01-12 DIAGNOSIS — Z9012 Acquired absence of left breast and nipple: Secondary | ICD-10-CM | POA: Diagnosis not present

## 2024-01-12 NOTE — Telephone Encounter (Signed)
 Patient is aware of scheduled appointment times/dates for follow up appointment as well as lab work, patient has callback number if appointment changes or is unavailable for the date of scheduled visit

## 2024-01-13 ENCOUNTER — Encounter: Payer: Self-pay | Admitting: *Deleted

## 2024-01-13 ENCOUNTER — Other Ambulatory Visit: Payer: Self-pay

## 2024-01-13 ENCOUNTER — Inpatient Hospital Stay: Payer: Commercial Managed Care - HMO | Admitting: Licensed Clinical Social Worker

## 2024-01-13 DIAGNOSIS — Z171 Estrogen receptor negative status [ER-]: Secondary | ICD-10-CM

## 2024-01-13 NOTE — Progress Notes (Signed)
 [facility] CSW Progress Note  Clinical Child psychotherapist contacted patient by phone to discuss options to address anxiety.  Pt verbalized agreement w/ engaging in counseling.  CSW to research counselors close to pt's home as she would prefer in person and will contact pt once a list is compiled.      Rachel Moulds, LCSW Clinical Social Worker [facility]    Patient is participating in a Managed Medicaid Plan:  Yes

## 2024-01-14 ENCOUNTER — Inpatient Hospital Stay: Payer: Commercial Managed Care - HMO | Admitting: Licensed Clinical Social Worker

## 2024-01-14 ENCOUNTER — Encounter: Payer: Self-pay | Admitting: Hematology

## 2024-01-14 DIAGNOSIS — Z171 Estrogen receptor negative status [ER-]: Secondary | ICD-10-CM

## 2024-01-14 NOTE — Progress Notes (Signed)
 [facility] CSW Progress Note  Clinical Social Worker  contacted counselors on behalf of pt to establish a provider.  Justice Deeds [phone]) verbalized agreement to accept pt and booked an appointment for pt on Monday, March 3rd at 2:00.  Deanna Artis verified she does accept Medicaid.  CSW called pt to inform of the above.  Pt did not answer, a voicemail was left providing appointment details and contact number for provider.  CSW to remain available as appropriate throughout duration of treatment to provide support.        Rachel Moulds, LCSW Clinical Social Worker [facility]    Patient is participating in a Managed Medicaid Plan:  Yes

## 2024-01-19 ENCOUNTER — Encounter: Payer: Self-pay | Admitting: Hematology

## 2024-01-23 ENCOUNTER — Inpatient Hospital Stay: Attending: Hematology | Admitting: Licensed Clinical Social Worker

## 2024-01-23 DIAGNOSIS — C50412 Malignant neoplasm of upper-outer quadrant of left female breast: Secondary | ICD-10-CM

## 2024-01-23 NOTE — Progress Notes (Signed)
 [facility] CSW Progress Note  Clinical Child psychotherapist  contacted pt by phone to check in after first counseling session with an outside provider.  Pt reports the session went very well and she is scheduled for her second appointment next week.  CSW to check in on pt in one month.        Rachel Moulds, LCSW Clinical Social Worker [facility]    Patient is participating in a Managed Medicaid Plan:  Yes

## 2024-02-02 NOTE — Progress Notes (Deleted)
 Office Visit Note  Patient: Gabrielle [name]             Date of Birth: 02-Apr-1991           MRN: [id number]             PCP: Patient, [name] Referring: Norm Salt, PA Visit Date: 02/16/2024   Subjective:  No chief complaint on file.   History of Present Illness: Gabrielle [name] is a 33 y.o. female here for follow up for ongoing joint pain and intermittent swelling with history of JRA and of pembrolizumab treatment.    Previous HPI 08/18/2023 Gabrielle [name] is a 33 y.o. female here for follow up for ongoing joint pain and intermittent swelling with history of JRA and of pembrolizumab treatment. After last visit she noticed a moderate improvement with taking meloxicam once daily. She ran out of the medication and experienced increase in symptoms again. She had an exacerbation of pain in her legs worst issue is the lower leg pain at night this also makes sleeping difficult. We increased gabapentin to 300 mg BID and this is partially helpful. Still notices mild swelling in both knees, worse on right side. Does not have as much leg pain or numbness while standing or walking.   Previous HPI 05/12/2023 Gabrielle [name] is a 33 y.o. female here for follow up for ongoing joint pain concern for possible inflammatory arthritis.  Lab testing at initial visit did show a very mildly elevated sedimentation rate of 25.  Otherwise was unremarkable for specific antibodies.  X-ray of the knee showed some considerable calcification at the patellar ligament insertion tibial tuberosity.  Still has ongoing knee pain worse on the right side and gets worse with prolonged use especially certain movements getting up from a long duration seated getting up from the floor or climbing stairs.  Gets visible swelling in the feet and ankles usually develops by the end of the day and is improved in the morning.   Previous HPI 04/10/23 Gabrielle [name] is a 33 y.o. female here for evaluation of joint pain especially knees.  She has a history of previous JRA but was apparently never on long term DMARD treatment mostly symptoms controlled with NSAIDs. Had been resolved without chronic joint inflammation and no surgery required. She was diagnosed with breast cancer last year and underwent lumpectomy and adjuvant chemotherapy regimen including paclitaxel, carboplatin, and [place] starting July last year. She had side effects with treatment including neuropathy in hands and feet started treatment with gabapentin for this. Also started to develop increased joint pain at multiple areas but particularly knees. She completed last infusion in November then completed radiation treatment in April. Many side effects and much of her overall body aches improved with still having knee pain. Does not see much visible swelling. Morning stiffness for a few minutes also has trouble getting up from sitting, worse after prolonged time in one position. Still has some pain in hands and feet but also sometimes prickly type sensation partially improved with gabapentin.   No Rheumatology ROS completed.   PMFS History:  Patient Active Problem List   Diagnosis Date Noted   S/P left mastectomy 01/01/2024   Other insomnia 08/18/2023   History of juvenile arthritis 04/10/2023   Nausea with vomiting 09/19/2022   Joint pain 09/19/2022   Encounter for antineoplastic chemotherapy 09/19/2022   Peripheral neuropathy 09/19/2022   Hypokalemia 09/19/2022   Port-A-Cath in place 06/14/2022   Genetic testing  03/29/2022   Family history of breast cancer 03/21/2022   Family history of prostate cancer 03/21/2022   Malignant neoplasm of upper-outer quadrant of left breast in female, estrogen receptor negative (HCC) 03/15/2022    Past Medical History:  Diagnosis Date   Anxiety    Arthritis    Bilateral Knees   Cancer (HCC) 03/11/2022   Left Breast   Family history of breast cancer 03/21/2022   Family history of prostate cancer 03/21/2022    Hypercholesteremia    Juvenile arthritis (HCC)    Neuropathy    Personal history of chemotherapy    Personal history of radiation therapy     Family History  Problem Relation Age of Onset   Breast cancer Mother 65   Other Mother        sleeping problems   Prostate cancer Father 12   Diabetes Brother    Breast cancer Paternal Grandmother        dx after 92   Prostate cancer Paternal Grandfather        dx 11s; metastatic   ADD / ADHD Son    Breast cancer Maternal Aunt 76   Breast cancer Paternal Aunt        dx unknown age   Prostate cancer Paternal Uncle        dx after 16   Cystic fibrosis Niece    Sleep apnea Neg Hx    Past Surgical History:  Procedure Laterality Date   AXILLARY SENTINEL NODE BIOPSY Left 05/14/2022   Procedure: LEFT AXILLARY SENTINEL NODE BIOPSY;  Surgeon: [name], MD;  Location: [facility] OR;  Service: General;  Laterality: Left;  GEN w/ PEC BLOCK   BREAST BIOPSY Left 03/11/2022   BREAST BIOPSY Left 11/25/2023   [place] LT BREAST BX W LOC DEV 1ST LESION IMG BX SPEC US GUIDE 11/25/2023 [facility]   BREAST LUMPECTOMY WITH AXILLARY LYMPH NODE BIOPSY Left 04/16/2022   Procedure: LEFT BREAST LUMPECTOMY WITH LEFT AXILLARY SENTINEL LYMPH NODE BIOPSY;  Surgeon: [name], MD;  Location: [facility];  Service: General;  Laterality: Left;   DENTAL SURGERY     PORTACATH PLACEMENT Right 04/16/2022   Procedure: INSERTION PORT-A-CATH;  Surgeon: [name], MD;  Location: [facility];  Service: General;  Laterality: Right;   SENTINEL NODE BIOPSY Left 01/01/2024   Procedure: LEFT AXILLARY SENTINEL NODE BIOPSY;  Surgeon: [name], MD;  Location: [facility];  Service: General;  Laterality: Left;   SIMPLE MASTECTOMY WITH AXILLARY SENTINEL NODE BIOPSY Left 01/01/2024   Procedure: LEFT MASTECTOMY;  Surgeon: [name], MD;  Location: [facility] OR;  Service: General;  Laterality: Left;   Social History   Social History  Narrative   Right handed   Caffeine: "not really"   Immunization History  Administered Date(s) Administered   Tdap 12/19/2016     Objective: Vital Signs: There were no vitals taken for this visit.   Physical Exam   Musculoskeletal Exam: ***  CDAI Exam: CDAI Score: -- Patient Global: --; Provider Global: -- Swollen: --; Tender: -- Joint Exam 02/16/2024   No joint exam has been documented for this visit   There is currently no information documented on the homunculus. Go to the Rheumatology activity and complete the homunculus joint exam.  Investigation: No additional findings.  Imaging: No results found.  Recent Labs: Lab Results  Component Value Date   WBC 6.4 12/29/2023   HGB 11.3 (L) 12/29/2023   PLT 232 12/29/2023   NA 138  12/29/2023   K 3.4 (L) 12/29/2023   CL 103 12/29/2023   CO2 27 12/29/2023   GLUCOSE 87 12/29/2023   BUN 11 12/29/2023   CREATININE 0.80 12/29/2023   BILITOT 0.4 10/30/2023   ALKPHOS 95 10/30/2023   AST 21 10/30/2023   ALT 8 10/30/2023   PROT 6.8 10/30/2023   ALBUMIN 3.9 10/30/2023   CALCIUM 9.9 12/29/2023    Speciality Comments: No specialty comments available.  Procedures:  No procedures performed Allergies: Patient has no known allergies.   Assessment / Plan:     Visit Diagnoses: No diagnosis found.  ***  Orders: No orders of the defined types were placed in this encounter.  No orders of the defined types were placed in this encounter.    Follow-Up Instructions: No follow-ups on file.   Metta Clines, RT  Note - This record has been created using [employer].  Chart creation errors have been sought, but may not always  have been located. Such creation errors do not reflect on  the standard of medical care.

## 2024-02-05 ENCOUNTER — Telehealth: Payer: Self-pay | Admitting: *Deleted

## 2024-02-05 NOTE — Telephone Encounter (Signed)
 Spoke to pt regarding long term SCP. Pt feels this is something she would like to move forward with. Msg sent for scheduling. No further needs or concerns voiced at this time.

## 2024-02-06 ENCOUNTER — Encounter: Payer: Self-pay | Admitting: Hematology

## 2024-02-13 ENCOUNTER — Telehealth: Payer: Self-pay | Admitting: Hematology

## 2024-02-13 NOTE — Telephone Encounter (Signed)
Scheduled appointment per referral. Patient is aware of the made appointment.

## 2024-02-14 ENCOUNTER — Other Ambulatory Visit: Payer: Self-pay

## 2024-02-16 ENCOUNTER — Ambulatory Visit: Payer: Medicaid Other | Admitting: Internal Medicine

## 2024-02-16 DIAGNOSIS — Z79899 Other long term (current) drug therapy: Secondary | ICD-10-CM

## 2024-02-16 DIAGNOSIS — G4709 Other insomnia: Secondary | ICD-10-CM

## 2024-02-16 DIAGNOSIS — G8929 Other chronic pain: Secondary | ICD-10-CM

## 2024-02-16 DIAGNOSIS — G6289 Other specified polyneuropathies: Secondary | ICD-10-CM

## 2024-02-26 ENCOUNTER — Encounter: Payer: Self-pay | Admitting: Hematology

## 2024-04-02 ENCOUNTER — Other Ambulatory Visit: Payer: Self-pay

## 2024-04-02 DIAGNOSIS — Z171 Estrogen receptor negative status [ER-]: Secondary | ICD-10-CM

## 2024-04-02 NOTE — Progress Notes (Signed)
 As per Rande Bushy NP, order placed in portal for Signatera to be drawn on 5/19, kit was placed in the lab to be drawn.

## 2024-04-05 ENCOUNTER — Inpatient Hospital Stay (HOSPITAL_BASED_OUTPATIENT_CLINIC_OR_DEPARTMENT_OTHER): Payer: Commercial Managed Care - HMO | Admitting: Nurse Practitioner

## 2024-04-05 ENCOUNTER — Other Ambulatory Visit: Payer: Self-pay | Admitting: Nurse Practitioner

## 2024-04-05 ENCOUNTER — Inpatient Hospital Stay: Payer: Commercial Managed Care - HMO | Attending: Hematology

## 2024-04-05 VITALS — BP 110/83 | HR 76 | Temp 98.1°F | Resp 14 | Wt 153.7 lb

## 2024-04-05 DIAGNOSIS — Z79899 Other long term (current) drug therapy: Secondary | ICD-10-CM | POA: Insufficient documentation

## 2024-04-05 DIAGNOSIS — C50412 Malignant neoplasm of upper-outer quadrant of left female breast: Secondary | ICD-10-CM | POA: Diagnosis present

## 2024-04-05 DIAGNOSIS — G629 Polyneuropathy, unspecified: Secondary | ICD-10-CM | POA: Insufficient documentation

## 2024-04-05 DIAGNOSIS — R112 Nausea with vomiting, unspecified: Secondary | ICD-10-CM | POA: Diagnosis not present

## 2024-04-05 DIAGNOSIS — Z9012 Acquired absence of left breast and nipple: Secondary | ICD-10-CM | POA: Diagnosis not present

## 2024-04-05 DIAGNOSIS — Z791 Long term (current) use of non-steroidal anti-inflammatories (NSAID): Secondary | ICD-10-CM | POA: Diagnosis not present

## 2024-04-05 DIAGNOSIS — Z171 Estrogen receptor negative status [ER-]: Secondary | ICD-10-CM

## 2024-04-05 LAB — CMP (CANCER CENTER ONLY)
ALT: 10 U/L (ref 0–44)
AST: 25 U/L (ref 15–41)
Albumin: 4.1 g/dL (ref 3.5–5.0)
Alkaline Phosphatase: 135 U/L — ABNORMAL HIGH (ref 38–126)
Anion gap: 5 (ref 5–15)
BUN: 15 mg/dL (ref 6–20)
CO2: 27 mmol/L (ref 22–32)
Calcium: 10 mg/dL (ref 8.9–10.3)
Chloride: 108 mmol/L (ref 98–111)
Creatinine: 0.75 mg/dL (ref 0.44–1.00)
GFR, Estimated: >60 mL/min (ref >60)
Glucose, Bld: 79 mg/dL (ref 70–99)
Potassium: 3.7 mmol/L (ref 3.5–5.1)
Sodium: 140 mmol/L (ref 135–145)
Total Bilirubin: 0.3 mg/dL (ref 0.0–1.2)
Total Protein: 7.2 g/dL (ref 6.5–8.1)

## 2024-04-05 LAB — CBC WITH DIFFERENTIAL (CANCER CENTER ONLY)
Abs Immature Granulocytes: 0.02 10*3/uL (ref 0.00–0.07)
Basophils Absolute: 0 10*3/uL (ref 0.0–0.1)
Basophils Relative: 0 %
Eosinophils Absolute: 0.3 10*3/uL (ref 0.0–0.5)
Eosinophils Relative: 4 %
HCT: 31.6 % — ABNORMAL LOW (ref 36.0–46.0)
Hemoglobin: 10.4 g/dL — ABNORMAL LOW (ref 12.0–15.0)
Immature Granulocytes: 0 %
Lymphocytes Relative: 50 %
Lymphs Abs: 3.3 10*3/uL (ref 0.7–4.0)
MCH: 26.5 pg (ref 26.0–34.0)
MCHC: 32.9 g/dL (ref 30.0–36.0)
MCV: 80.4 fL (ref 80.0–100.0)
Monocytes Absolute: 0.4 10*3/uL (ref 0.1–1.0)
Monocytes Relative: 6 %
Neutro Abs: 2.7 10*3/uL (ref 1.7–7.7)
Neutrophils Relative %: 40 %
Platelet Count: 201 10*3/uL (ref 150–400)
RBC: 3.93 MIL/uL (ref 3.87–5.11)
RDW: 15.5 % (ref 11.5–15.5)
WBC Count: 6.7 10*3/uL (ref 4.0–10.5)
nRBC: 0 % (ref 0.0–0.2)

## 2024-04-05 LAB — GENETIC SCREENING ORDER

## 2024-04-05 NOTE — Progress Notes (Unsigned)
 Patient Care Team: Patient, [name] as PCP - General (General Practice) Gabrielle Hsu, RN as Oncology Nurse Navigator Gabrielle Bo, RN as Oncology Nurse Navigator Gabrielle Harry, MD as Consulting Physician (General Surgery) Gabrielle St. 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 SLN biopsy on 05/14/22 was negative (0/4). -she started zoladex  on 06/03/22. -she began chemotherapy with carboplatin /taxol  and Keytruda  on 06/14/22. Taxol  switched to abraxane  with C2D8 due to itching and mild numbness. 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 -she switched to AC with continuing Keytruda  on 09/19/22. She tolerated poorly with persistent N/V, fatigue and anorexia, so treatment was stopped after one cycle, she was encouraged to continue Keytruda  for one year but she declined  -S/p radiation 01/16/23 - 03/06/2023 -she had local recurrence of metaplastic carcinoma in 11/2023, plan for mastectomy. -staging bone scan is negative, pt has been very resistant for CT, -CT CAP done 12/18/2023 was negative for convincing evidence of metastatic disease in the chest, abdomen, or pelvis. -She underwent left mastectomy on 01/01/2024 per Dr.  Delane [name]. -- Pathology consistent with pT3 N0 metaplastic carcinoma with extensive necrosis.  It was negative for angiolymphatic invasion.  Tumor was ER negative, PR negative, HER2 negative, Ki-67 25%.  There were 5 benign lymph nodes removed. -Discussed risks versus benefits of pursuing treatment with Keytruda  every 3 weeks for 1 year with the patient.  She is very reluctant as she had poor tolerance of any chemotherapy. -04/05/2024 - ctDNA Signatera test drawn today.  Patient more amenable to treatment with Keytruda  if test is positive for circulating tumor DNA.  She understands that she had clear margins and negative lymph nodes from left mastectomy.  CA 27-29 <9.0 today. - Repeat labs and follow-up in 4 weeks.  Revisit benefits of treatment with Keytruda .will also revisit benefit of having Port-A-Cath removed, especially if not being used or accessed in any way.     Left breast cancer, triple negative  Left breast mastectomy done on 01/01/2024.  Patient continues to have open surgical wound for which she is seeing the surgeon.  She was told by the surgeon that he may have to "go back in" to cut out infection.  Patient has declined any further procedures on her breast.  Per patient, she has clean and dry dressing over the wound.  She is currently wearing a postoperative breast binder.  Was unwilling to let me examine the wound.  She does report that it is improving.  Discussed role of ctDNA Signatera testing.  Also discussed the role of pembrolizumab  and prevention of metastatic breast cancer or breast cancer recurrence.  In discussion, she stated she would like to wait until ctDNA. testing to come back prior to making decision for additional treatment with Keytruda  every 3 weeks.  Port-A-Cath in place Patient does have a Port-A-Cath in the right chest.  States  that if she agrees to proceed with treatment with Keytruda , she would do this peripherally.  She does not want to have her Port-A-Cath accessed  to be used.  Suggested she talk with Dr. Delane [name] about removing her port.  She refused.  Discussed risk factors of having Port-A-Cath in place and not being used.  This includes blood clots and infections.  She voiced understanding but still does not want to have the port touched or accessed for any reason.  Generalized anxiety Patient reports having bad anxiety.  Has seen psychiatry and a counselor.  Not really helping.  She states that talking about her anxiety only makes it worse.  States that few months ago, she received an anxiety medicine from Dr. Carlye [name] to take as needed.  This is the only thing that has helped so far.  States she was given another medication by psychiatry which only made her feel worse.  States that the compound the issue, her disability request was denied again.  She is now having to stay with family.  Unsure of how long she will have to stay with them.  Has increased her stress level as well as the stress levels of her family.  Will renew prescription for lorazepam  1 mg, taking 1/2 to 1 tablet up to 3 times daily if needed.  A prescription for #30 tablets with 1 refill sent to her pharmacy.  Plan Reviewed labs. -Stable anemia with Hgb 10.4 and HCT 31.6.  Unremarkable CMP. -CA 27-29 <9.0. -ctDNA Signatera drawn today.  Will take approximately 2 to 3 weeks for results to be returned.  Patient more open to adjuvant Keytruda  if Signatera test is positive for circulating tumor DNA. Continue to follow-up with surgery for management of postsurgical wound. Repeat labs and follow-up in 4 weeks.  Revisit benefits of Keytruda  every 3 weeks for the remainder of 1 year.  Also revisit benefits over risks of having Port-A-Cath removed. The patient understands the plans discussed today and is in agreement with them.  She knows to contact our office if she develops concerns prior to her next appointment.  I provided 30 minutes of face-to-face time during this encounter and > 50% was spent  counseling as documented under my assessment and plan.    Gabrielle Deis, NP  [facility] - A DEPT OF [facility] [address] [place] [address] Dept: [phone] Dept Fax: [phone]   No orders of the defined types were placed in this encounter.     CHIEF COMPLAINT:  CC: left breast cancer, estrogen receptor negative   Current Treatment:  definitive surgery and possible treatment with Keytruda    INTERVAL HISTORY:  Gabrielle [name] is here today for repeat clinical assessment. She was last seen 01/12/2024 by me. Scheduled to have ctDNA Signatera testing with her labs today. Results will help determine the benefits she may have from treatment with Keytruda .  States she is not sure open to treatment with Keytruda  if Signatera test positive for circulating tumor DNA.  She did not have Port-A-Cath removed during surgery on 01/01/2024.  States she does not want her Port-A-Cath accessed or handled in any way at this time.  She does voice understanding of the risks for blood clots and/or infection.  Has moderate anxiety.  States that seeing the psychiatrist and counselor may make anxiety worse.  Seems that talking about anxiety worsens rather than helps her symptoms.  She did have a prescription  for lorazepam  from Dr. Grayland Le which did help anxiety when needed.  Would like to have a refill for that if possible.  She denies chest pain, chest pressure, or shortness of breath. She denies headaches or visual disturbances. She denies abdominal pain, nausea, vomiting, or changes in bowel or bladder habits.  She denies fevers or chills. She denies pain. Her appetite is good. Her weight has decreased 6 pounds over last 4 months.  She feels like she did have gained weight as she does have increased appetite and is eating much more than she was prior to surgery in February.  I have reviewed the past medical history, past surgical history, social  history and family history with the patient and they are unchanged from previous note.  ALLERGIES:  has no known allergies.  MEDICATIONS:  Current Outpatient Medications  Medication Sig Dispense Refill   gabapentin  (NEURONTIN ) 300 MG capsule Take 1 capsule (300 mg total) by mouth in the morning and at bedtime. (Patient taking differently: Take 300 mg by mouth 2 (two) times daily as needed (leg pain.).) 180 capsule 1   meloxicam  (MOBIC ) 15 MG tablet Take 1 tablet (15 mg total) by mouth daily. (Patient taking differently: Take 15 mg by mouth daily as needed (knee pain.).) 90 tablet 1   methocarbamol  (ROBAXIN ) 500 MG tablet Take 1 tablet (500 mg total) by mouth 4 (four) times daily. 40 tablet 0   traMADol  (ULTRAM ) 50 MG tablet Take 50 mg by mouth.     LORazepam  (ATIVAN ) 1 MG tablet Take 0.5-1 tablets (0.5-1 mg total) by mouth every 8 (eight) hours as needed for anxiety. Take 1 tab 1 hour before CT scan or breast biopsy, if not enough, OK to take second dose before CT 30 tablet 1   No current facility-administered medications for this visit.   Facility-Administered Medications Ordered in Other Visits  Medication Dose Route Frequency Provider Last Rate Last Admin   diphenhydrAMINE  (BENADRYL ) 50 MG/ML injection            famotidine  (PEPCID ) 20-0.9 MG/50ML-% IVPB             HISTORY OF PRESENT ILLNESS:   Oncology History Overview Note   Cancer Staging  Malignant neoplasm of upper-outer quadrant of left breast in female, estrogen receptor negative (HCC) Staging form: Breast, AJCC 8th Edition - Clinical stage from 03/11/2022: Stage IIB (cT2, cN0, cM0, G3, ER-, PR-, HER2-) - Signed by Gabrielle [place], MD on 03/19/2022    Malignant neoplasm of upper-outer quadrant of left breast in female, estrogen receptor negative (HCC)  03/08/2022 Mammogram   CLINICAL DATA:  33 year old female presenting for evaluation of a palpable lump in the left breast which she feels has increased in size since she first  identified it. Her mother was diagnosed with breast cancer within the last 6 months. She also has family history of breast cancer in multiple aunts and her maternal grandmother.   EXAM: DIGITAL DIAGNOSTIC BILATERAL MAMMOGRAM WITH TOMOSYNTHESIS AND CAD; ULTRASOUND LEFT BREAST LIMITED  IMPRESSION: 1. There is a suspicious 3.7 cm mass in the left breast at 12 o'clock.   2.  No evidence of left axillary lymphadenopathy.   3.  No evidence of malignancy in the right breast.   03/11/2022 Cancer Staging   Staging form: Breast, AJCC 8th Edition - Clinical stage from 03/11/2022: Stage IIB (cT2, cN0, cM0, G3, ER-, PR-, HER2-) - Signed by Gabrielle , MD on 03/19/2022 Stage prefix: Initial diagnosis Histologic grading system: 3 grade system  03/11/2022 Initial Biopsy   Diagnosis Breast, left, needle core biopsy, 12:00 - METAPLASTIC CARCINOMA - SEE COMMENT  Microscopic Comment The biopsy has an invasive epithelial component admixed with chondroid deposition, consistent with a metaplastic carcinoma. Based on the biopsy, the carcinoma appears Nottingham grade 3 of 3 and measures 1.2 cm in greatest linear extent.  PROGNOSTIC INDICATORS Results: The tumor cells are NEGATIVE for Her2 (0). Estrogen Receptor: 0%, NEGATIVE Progesterone Receptor: 0%, NEGATIVE Proliferation Marker Ki67: 40%   03/15/2022 Initial Diagnosis   Malignant neoplasm of upper-outer quadrant of left breast in female, estrogen receptor negative (HCC)   03/28/2022 Genetic Testing   Negative hereditary cancer genetic testing: no pathogenic variants detected in [facility] BRCAPlus Panel or [employer] CustomNext-Cancer +RNAinsight Panel.  Report dates are 03/28/2022 and 03/31/2022. Aaron Aas   The BRCAplus panel offered by [employer] and includes sequencing and deletion/duplication analysis for the following 8 genes: ATM, BRCA1, BRCA2, CDH1, CHEK2, PALB2, PTEN, and TP53.  The CustomNext-Cancer+RNAinsight panel offered by [employer] includes  sequencing and rearrangement analysis for the following 47 genes:  APC, ATM, AXIN2, BARD1, BMPR1A, BRCA1, BRCA2, BRIP1, CDH1, CDK4, CDKN2A, CHEK2, DICER1, EPCAM, GREM1, HOXB13, MEN1, MLH1, MSH2, MSH3, MSH6, MUTYH, NBN, NF1, NF2, NTHL1, PALB2, PMS2, POLD1, POLE, PTEN, RAD51C, RAD51D, RECQL, RET, SDHA, SDHAF2, SDHB, SDHC, SDHD, SMAD4, SMARCA4, STK11, TP53, TSC1, TSC2, and VHL.  RNA data is routinely analyzed for use in variant interpretation for all genes.   05/14/2022 Cancer Staging   Staging form: Breast, AJCC 8th Edition - Pathologic stage from 05/14/2022: Stage IIA (pT2, pN0, cM0, G3, ER-, PR-, HER2-) - Signed by Gabrielle [place], MD on 05/28/2022 Stage prefix: Initial diagnosis Histologic grading system: 3 grade system Residual tumor (R): R0 - None   06/13/2022 -  Chemotherapy   Patient is on Treatment Plan : BREAST Pembrolizumab  (200) D1 + Carboplatin  (1.5) D1,8,15 + Paclitaxel  (80) D1,8,15 q21d X 4 cycles / Pembrolizumab  (200) D1 + AC D1 q21d x 4 cycles     06/14/2022 - 07/05/2022 Chemotherapy   Patient is on Treatment Plan : BREAST Pembrolizumab  (200) D1 + Carboplatin  (5) D1 + Paclitaxel  (80) D1,8,15 q21d X 4 cycles / Pembrolizumab  (200) D1 + AC D1 q21d x 4 cycles     12/18/2023 Imaging   CT chest, abdomen, and pelvis with contrast  IMPRESSION: 1. Skin thickening of the left breast with multiple calcifications and asymmetric soft tissue, compatible with known left breast  malignancy. 2. No convincing evidence of metastatic disease in the chest, abdomen or pelvis. 3. Crescentic soft tissue in the anterior mediastinum which conforms to underlying vasculature, favored to reflect thymic tissue suggest attention on follow-up imaging. 4. Cholelithiasis without findings of acute cholecystitis.   01/01/2024 Surgery   Left mastectomy - Dr. Delane [name]       REVIEW OF SYSTEMS:   Constitutional: Denies fevers, chills or abnormal weight loss. Improved appetite. Eating more. Eyes: Denies blurriness of  vision Ears, nose, mouth, throat, and face: Denies mucositis or sore throat Respiratory: Denies cough, dyspnea or wheezes Cardiovascular: Denies palpitation, chest discomfort or lower extremity swelling Gastrointestinal:  Denies nausea, heartburn or change in bowel habits Skin: Denies abnormal skin rashes Lymphatics: Denies new lymphadenopathy or easy bruising Neurological:Denies numbness, tingling or new weaknesses Behavioral/Psych: Mood is stable, no new changes. Increased anxiety and depression. States that talking about problems with psychiatry and counselor only seems to make her feel more anxious.  All other systems were reviewed with the patient and are negative.   VITALS:  Today's Vitals   04/05/24 1343 04/05/24 1412  BP: 110/83   Pulse: 76   Resp: 14   Temp: 98.1 F (36.7 C)   TempSrc: Temporal   SpO2: 100%   Weight: 153 lb 11.2 oz (69.7 kg)   PainSc:  0-No pain   Body mass index is 22.7 kg/m.   Wt Readings from Last 3 Encounters:  04/05/24 153 lb 11.2 oz (69.7 kg)  01/12/24 159 lb 12.8 oz (72.5 kg)  01/01/24 167 lb 1.6 oz (75.8 kg)    Body mass index is 22.7 kg/m.  Performance status (ECOG): 1 - Symptomatic but completely ambulatory  PHYSICAL EXAM:   GENERAL:alert, no distress and comfortable SKIN: skin color, texture, turgor are normal, no rashes or significant lesions EYES: normal, Conjunctiva are pink and non-injected, sclera clear OROPHARYNX:no exudate, no erythema and lips, buccal mucosa, and tongue normal  NECK: supple, thyroid  normal size, non-tender, without nodularity LYMPH:  no palpable lymphadenopathy in the cervical, axillary or inguinal LUNGS: clear to auscultation and percussion with normal breathing effort HEART: regular rate & rhythm and no murmurs and no lower extremity edema ABDOMEN:abdomen soft, non-tender and normal bowel sounds Musculoskeletal:no cyanosis of digits and no clubbing  NEURO: alert & oriented x 3 with fluent speech, no  focal motor/sensory deficits PSYCH: seems anxious and depressed. She is not suicidal. She does not want to hurt herself of anyone else.  BREAST: Patient declined breast exam.  She does have a clean and dry dressing over her surgical wounds which is covered by post operative breast binder.  LABORATORY DATA:  I have reviewed the data as listed    Component Value Date/Time   NA 140 04/05/2024 1320   K 3.7 04/05/2024 1320   CL 108 04/05/2024 1320   CO2 27 04/05/2024 1320   GLUCOSE 79 04/05/2024 1320   BUN 15 04/05/2024 1320   CREATININE 0.75 04/05/2024 1320   CREATININE 0.68 04/10/2023 1021   CALCIUM  10.0 04/05/2024 1320   CALCIUM  NSERUM 10/24/2022 1114   PROT 7.2 04/05/2024 1320   ALBUMIN  4.1 04/05/2024 1320   AST 25 04/05/2024 1320   ALT 10 04/05/2024 1320   ALKPHOS 135 (H) 04/05/2024 1320   BILITOT 0.3 04/05/2024 1320   GFRNONAA >60 04/05/2024 1320    Lab Results  Component Value Date   WBC 6.7 04/05/2024   NEUTROABS 2.7 04/05/2024   HGB 10.4 (L) 04/05/2024   HCT 31.6 (L) 04/05/2024   MCV 80.4 04/05/2024   PLT 201 04/05/2024

## 2024-04-05 NOTE — Assessment & Plan Note (Signed)
 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 SLN biopsy on 05/14/22 was negative (0/4). -she started zoladex  on 06/03/22. -she began chemotherapy with carboplatin /taxol  and Keytruda  on 06/14/22. Taxol  switched to abraxane  with C2D8 due to itching and mild numbness. 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 -she switched to AC with continuing Keytruda  on 09/19/22. She tolerated poorly with persistent N/V, fatigue and anorexia, so treatment was stopped after one cycle, she was encouraged to continue Keytruda  for one year but she declined  -S/p radiation 01/16/23 - 03/06/2023 -she had local recurrence of metaplastic carcinoma in 11/2023, plan for mastectomy. -staging bone scan is negative, pt has been very resistant for CT, -CT CAP done 12/18/2023 was negative for convincing evidence of metastatic disease in the chest, abdomen, or pelvis. -She underwent left mastectomy on 01/01/2024 per Dr. Delane Fear. -- Pathology consistent with pT3 N0 metaplastic carcinoma with extensive necrosis.  It was negative for angiolymphatic invasion.  Tumor was ER negative, PR negative, HER2 negative, Ki-67 25%.  There were 5 benign lymph nodes removed. -Discussed risks versus benefits of pursuing treatment with Keytruda  every 3 weeks for 1 year with the patient.  She is very reluctant as she had poor tolerance of any chemotherapy. -Will schedule Signatera testing in her home in next 3 to 4 weeks.  Will repeat testing every 3 to 4 months with routine labs and surveillance.

## 2024-04-06 LAB — CANCER ANTIGEN 27.29: CA 27.29: <9 U/mL (ref 0.0–38.6)

## 2024-04-13 ENCOUNTER — Encounter: Payer: Self-pay | Admitting: Hematology

## 2024-04-13 ENCOUNTER — Encounter: Payer: Self-pay | Admitting: Nurse Practitioner

## 2024-04-13 MED ORDER — LORAZEPAM 1 MG PO TABS: 0.5000 mg | ORAL_TABLET | Freq: Three times a day (TID) | ORAL | 1 refills | Status: DC | PRN

## 2024-04-14 ENCOUNTER — Other Ambulatory Visit: Payer: Self-pay

## 2024-04-20 ENCOUNTER — Other Ambulatory Visit: Payer: Self-pay

## 2024-04-21 ENCOUNTER — Other Ambulatory Visit: Payer: Self-pay

## 2024-04-21 DIAGNOSIS — C50412 Malignant neoplasm of upper-outer quadrant of left female breast: Secondary | ICD-10-CM

## 2024-04-22 ENCOUNTER — Inpatient Hospital Stay

## 2024-04-22 ENCOUNTER — Inpatient Hospital Stay: Admitting: Hematology

## 2024-04-22 NOTE — Assessment & Plan Note (Deleted)
 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 SLN biopsy on 05/14/22 was negative (0/4). -she started zoladex  on 06/03/22. -she began chemotherapy with carboplatin /taxol  and Keytruda  on 06/14/22. Taxol  switched to abraxane  with C2D8 due to itching and mild numbness. 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 -she switched to AC with continuing Keytruda  on 09/19/22. She tolerated poorly with persistent N/V, fatigue and anorexia, so treatment was stopped after one cycle, I encourage her to continue Keytruda  for one year but she declined  -S/p radiation 01/16/23 - 03/06/2023 -she had local recurrence of metaplastic carcinoma in 11/2023, plan for mastectomy. -staging bone scan and CT negative in 11/2023 -on surveillance now

## 2024-04-28 NOTE — Therapy (Signed)
 OUTPATIENT PHYSICAL THERAPY  UPPER EXTREMITY ONCOLOGY EVALUATION  Patient Name: Gabrielle [name] MRN: [id number] DOB:06/15/1991, 33 y.o., female Today's Date: 04/29/2024  END OF SESSION:  PT End of Session - 04/29/24 1528     Visit Number 1    Number of Visits 12    Date for PT Re-Evaluation 06/10/24    Authorization Type [employer]    PT Start Time 1002    PT Stop Time 1055    PT Time Calculation (min) 53 min    Activity Tolerance Patient tolerated treatment well    Behavior During Therapy WFL for tasks assessed/performed          Past Medical History:  Diagnosis Date   Anxiety    Arthritis    Bilateral Knees   Cancer (HCC) 03/11/2022   Left Breast   Family history of breast cancer 03/21/2022   Family history of prostate cancer 03/21/2022   Hypercholesteremia    Juvenile arthritis (HCC)    Neuropathy    Personal history of chemotherapy    Personal history of radiation therapy    Past Surgical History:  Procedure Laterality Date   AXILLARY SENTINEL NODE BIOPSY Left 05/14/2022   Procedure: LEFT AXILLARY SENTINEL NODE BIOPSY;  Surgeon: Enid Harry, MD;  Location: [facility] OR;  Service: General;  Laterality: Left;  GEN w/ PEC BLOCK   BREAST BIOPSY Left 03/11/2022   BREAST BIOPSY Left 11/25/2023   US  LT BREAST BX W LOC DEV 1ST LESION IMG BX SPEC US  GUIDE 11/25/2023 [facility]   BREAST LUMPECTOMY WITH AXILLARY LYMPH NODE BIOPSY Left 04/16/2022   Procedure: LEFT BREAST LUMPECTOMY WITH LEFT AXILLARY SENTINEL LYMPH NODE BIOPSY;  Surgeon: Enid Harry, MD;  Location: [facility];  Service: General;  Laterality: Left;   DENTAL SURGERY     PORTACATH PLACEMENT Right 04/16/2022   Procedure: INSERTION PORT-A-CATH;  Surgeon: Enid Harry, MD;  Location: [facility];  Service: General;  Laterality: Right;   SENTINEL NODE BIOPSY Left 01/01/2024   Procedure: LEFT AXILLARY SENTINEL NODE BIOPSY;  Surgeon: Enid Harry, MD;  Location: [facility]  OR;  Service: General;  Laterality: Left;   SIMPLE MASTECTOMY WITH AXILLARY SENTINEL NODE BIOPSY Left 01/01/2024   Procedure: LEFT MASTECTOMY;  Surgeon: Enid Harry, MD;  Location: [facility] OR;  Service: General;  Laterality: Left;   Patient Active Problem List   Diagnosis Date Noted   S/P left mastectomy 01/01/2024   Other insomnia 08/18/2023   History of juvenile arthritis 04/10/2023   Nausea with vomiting 09/19/2022   Joint pain 09/19/2022   Encounter for antineoplastic chemotherapy 09/19/2022   Peripheral neuropathy 09/19/2022   Hypokalemia 09/19/2022   Port-A-Cath in place 06/14/2022   Genetic testing 03/29/2022   Family history of breast cancer 03/21/2022   Family history of prostate cancer 03/21/2022   Malignant neoplasm of upper-outer quadrant of left breast in female, estrogen receptor negative (HCC) 03/15/2022     REFERRING PROVIDER: Enid Harry, MD  REFERRING DIAG: s/p Left Mastectomy  THERAPY DIAG:  Malignant neoplasm of upper-outer quadrant of left breast in female, estrogen receptor negative (HCC)  Aftercare following surgery for neoplasm  Abnormal posture  Stiffness of left shoulder, not elsewhere classified  Muscle weakness (generalized)  ONSET DATE: 01/01/2024  Rationale for Evaluation and Treatment: Rehabilitation  SUBJECTIVE:  SUBJECTIVE STATEMENT:  The Doctor wanted me to come for my left shoulder because since the mastectomy I have not been able to reach and move my arm like I could before. He wants me to have some wound care, but they can't see me until July. I have trouble reaching into cabinets, performing hair care, reaching behind her back, opening jars. I still have the neuropathy in my legs/feet from prior chemo. There is no pain in my shoulder, I just can't  use it well.  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 Attempted to do SLNB but only fibroadipose tissue was removed. Returned to sx for removal of 4 negative LNs 05/14/22 . She had chemotherapy and immunotherapy that was not well tolerated and radiation. She has recurrent metaplastic cancer left breast. She underwent a mastectomy with SLNB on 01/01/2024 and had difficulty with a seroma.. She had 5 negative nodes. It was also a triple negative cancer. She has had difficulty with wound healing and continues to follow up with the surgeon. She continues with wet to dry dressings and has declined other available options. She had it drained by MD 2-3 times. Presently having difficulty with neuropathy in her feet, and stiffness pain, especially knees down with trouble standing erect and numerous falls.  PAIN:   Are you having pain? No  PRECAUTIONS:  Left UE lymphedema risk, JRA, CIPN, FALL RISK   RED FLAGS: None   WEIGHT BEARING RESTRICTIONS: No  FALLS:  Has patient fallen in last 6 months? No  LIVING ENVIRONMENT: Lives with: mom She has 2 boys 15, and 7 living with other relatives presently  OCCUPATION: not working  LEISURE: walking  HAND DOMINANCE: right   PRIOR LEVEL OF FUNCTION: Independent  PATIENT GOALS:  improve left arm ROM, strength, improve ability to use arm normally   OBJECTIVE: Note: Objective measures were completed at Evaluation unless otherwise noted.  COGNITION: Overall cognitive status: Within functional limits for tasks assessed   PALPATION: No assessed  OBSERVATIONS / OTHER ASSESSMENTS: 2.5 cm wound area by approx 1.5 cm deep area pt treating with wet to dry dressing. Remainder of mastectomy incision healing well.  SENSATION: Light touch: Deficits    POSTURE: Very flexed posture at hips with forward head, rounded shoulders; requires VC for erect  posture  UPPER EXTREMITY AROM/PROM:  A/PROM RIGHT   eval   Shoulder extension 55  Shoulder flexion 130  Shoulder abduction 120  Shoulder internal rotation 70  Shoulder external rotation 95    (Blank rows = not tested)  A/PROM LEFT   eval  Shoulder extension 45  Shoulder flexion 74 A pulls/P 105  Shoulder abduction 72 Apulls/PROM 80  Shoulder internal rotation PROM 95  Shoulder external rotation PROM at 45: 77    (Blank rows = not tested)  CERVICAL AROM: All within functional limits:     UPPER EXTREMITY STRENGTH:   LYMPHEDEMA ASSESSMENTS:   SURGERY TYPE/DATE: Left lumpectomy 04/16/2022 , Left Mastectomy 01/01/2024  NUMBER OF LYMPH NODES REMOVED: 0/4 with lumpectomy,0/5 with mastectomy, total 0/9  CHEMOTHERAPY: Yes  RADIATION:YES  HORMONE TREATMENT: NO  INFECTIONS: no   LYMPHEDEMA ASSESSMENTS:   LANDMARK RIGHT  eval  At axilla  26.2  15 cm proximal to olecranon process   10 cm proximal to olecranon process 25.2  Olecranon process 22.3  15 cm proximal to ulnar styloid process   10 cm proximal to ulnar styloid process 17.1  Just proximal  to ulnar styloid process 14.3  Across hand at thumb web space   At base of 2nd digit 5.6  (Blank rows = not tested)  LANDMARK LEFT  eval  At axilla  26.8  15 cm proximal to olecranon process   10 cm proximal to olecranon process 25.2  Olecranon process 22.3  15 cm proximal to ulnar styloid process   10 cm proximal to ulnar styloid process 16.8  Just proximal to ulnar styloid process 14.1  Across hand at thumb web space   At base of 2nd digit 5.45  (Blank rows = not tested)   FUNCTIONAL TESTS:    GAIT:flexed posture, difficulty noted rising from chairs, some gait deviation due to neuropathy from prior chemo   QUICK DASH SURVEY: 34.09%                                                                                                                            TREATMENT DATE:  04/29/2024 Educated pt in 4 post op  exercises including supine clasped  hand flexion, stargazer with hands behind head, sitting scapular retraction and wall slide for abd. She performed each x 4-5 reps and was given illustrated and written instructions. Reminded to stretch gently and not into pain. Also performed PROM left shoulder flexion, scaption, IR and ER. Watched wound area briefly;did not appear to be opening with ROM. [facility] and pt spoke to [facility] who scheduled her for 2 visits next week.   PATIENT EDUCATION:  Education details: 4 post op exercises including supine clasped  hand flexion, stargazer with hands behind head, sitting scapular retraction and wall slide for abd. She performed each x 4-5 reps into tightness only, not pain. discussed clinic in [place], LOS, treatment interventions. Spoke on phone with Pattie Borders to set up 2 appts. For next week. Person educated: Patient Education method: Explanation, Demonstration, and Handouts Education comprehension: verbalized understanding, returned demonstration, and needs further education  HOME EXERCISE PROGRAM: 4 post op exercises including supine clasped  hand flexion, stargazer with hands behind head, sitting scapular retraction and wall slide for abd. She performed each x 4-5 reps.  ASSESSMENT:  CLINICAL IMPRESSION: Patient is a 33 y.o. female who was seen today for physical therapy evaluation and treatment for limitations in left shoulder ROM and strength secondary to Mastectomy in Feb 2025, with some delayed wound healing.  She is not having any pain, but has functional limitations with reaching, hair care, bathing, etc due to stiffness/weakness.  Quick dash is with 34% functional deficit which would be higher if this was her dominant arm. She feels appropriate tightness in the axilla and lateral trunk limiting her ROM since her surgery.  She also requires guidance with posture due to significant hip flexion, forward head, rounded shoulder posture.She will  benefit from skilled PT to address deficits and return pt to her prior functional level.   OBJECTIVE IMPAIRMENTS: Abnormal gait, decreased balance, decreased knowledge of condition, decreased ROM, decreased  strength, increased fascial restrictions, impaired flexibility, impaired UE functional use, and postural dysfunction.   ACTIVITY LIMITATIONS: lifting, standing, bathing, dressing, reach over head, and hygiene/grooming  PARTICIPATION LIMITATIONS: cleaning, laundry, and driving  PERSONAL FACTORS: 3+ comorbidities: 2 breast surgeries with Mastectomy in February, chemotherapy, prior radiation, are also affecting patient's functional outcome.   REHAB POTENTIAL: Good  CLINICAL DECISION MAKING: Evolving/moderate complexity  EVALUATION COMPLEXITY: Moderate  GOALS: Goals reviewed with patient? Yes  SHORT TERM GOALS=LONG TERM GOALS: Target date: 06/15/2024  Pt will be independent with a HEP for left shoulder ROM and strengthening Baseline: Goal status: INITIAL  2.  Pt will improve bilateral shoulder  flexion AROM to atleast 135 degrees for improvement in home activities, such as reaching to cabinets Baseline:  Goal status: INITIAL  3.  Pt will improve bilateral shoulder Abduction to atleast 150 degrees for improved ability to perform hair care and reaching Baseline:  Goal status: INITIAL  4.  Quick dash will be no greater than 15 % to demonstrate improved function Baseline:  Goal status: INITIAL  5.  Pt will be independent with progressive UE strength program Baseline:  Goal status: INITIAL   PLAN:  PT FREQUENCY: 2x/week  PT DURATION: 6 weeks  PLANNED INTERVENTIONS: [id number]- PT Re-evaluation, 97750- Physical Performance Testing, 97110-Therapeutic exercises, 97530- Therapeutic activity, [id number]- Neuromuscular re-education, 97535- Self Care, [id number]- Manual therapy, and Patient/Family education  PLAN FOR NEXT SESSION: How is HEP? Review and progress as ready. How is chest wound?  Any problems since starting exercises?,PROM, and progress to strength as she improves( low resistance, low reps to start)May benefit from further therapy for LE strength/balance when improved with Ue's. Very beneficial to be able to have therapy near home.  Latisha Poland, PT 04/29/2024, 3:37 PM

## 2024-04-29 ENCOUNTER — Other Ambulatory Visit: Payer: Self-pay

## 2024-04-29 ENCOUNTER — Ambulatory Visit: Attending: General Surgery

## 2024-04-29 DIAGNOSIS — Z171 Estrogen receptor negative status [ER-]: Secondary | ICD-10-CM | POA: Diagnosis present

## 2024-04-29 DIAGNOSIS — R293 Abnormal posture: Secondary | ICD-10-CM | POA: Insufficient documentation

## 2024-04-29 DIAGNOSIS — M25612 Stiffness of left shoulder, not elsewhere classified: Secondary | ICD-10-CM | POA: Diagnosis present

## 2024-04-29 DIAGNOSIS — C50412 Malignant neoplasm of upper-outer quadrant of left female breast: Secondary | ICD-10-CM | POA: Diagnosis present

## 2024-04-29 DIAGNOSIS — Z483 Aftercare following surgery for neoplasm: Secondary | ICD-10-CM | POA: Diagnosis present

## 2024-04-29 DIAGNOSIS — M6281 Muscle weakness (generalized): Secondary | ICD-10-CM | POA: Insufficient documentation

## 2024-05-04 LAB — SIGNATERA
SIGNATERA MTM READOUT: 0 MTM/ml
SIGNATERA TEST RESULT: NEGATIVE

## 2024-05-05 ENCOUNTER — Encounter

## 2024-05-07 ENCOUNTER — Encounter

## 2024-05-11 ENCOUNTER — Encounter (HOSPITAL_COMMUNITY): Payer: Self-pay

## 2024-05-12 ENCOUNTER — Ambulatory Visit: Admitting: Family Medicine

## 2024-05-12 ENCOUNTER — Encounter: Payer: Self-pay | Admitting: General Practice

## 2024-05-25 ENCOUNTER — Telehealth: Payer: Self-pay | Admitting: Adult Health

## 2024-05-25 ENCOUNTER — Other Ambulatory Visit: Payer: Self-pay

## 2024-05-25 NOTE — Telephone Encounter (Signed)
 Called to reschedule patient appointment to due provider pal  request. I talked  to patient and they are aware of the changes that was made to the upcoming appointment

## 2024-06-01 ENCOUNTER — Encounter: Admitting: Adult Health

## 2024-06-04 NOTE — Progress Notes (Signed)
   PROVIDER:  DONNICE CARLIN BURY, MD  MRN: [id number] DOB: February 23, 1991 DATE OF ENCOUNTER: 06/04/2024 Interval History:     33 year old female who had a recurrent metaplastic cancer left breast. She underwent a mastectomy. Her drain had issues and had been removed early.  Her pathology showed a metaplastic cancer that measured 6.2 cm in greatest dimension. This is a triple negative cancer. Her margins were all clear. She had 5 negative sentinel lymph nodes.  She had a seroma and then skin/soft tissue necrosis that I have been treating in office. I discussed returning to the OR but she does not want to . I have debrided this in office and it is slowly improving. This has basically healed now    Physical Examination:   Physical Exam   Wound healed, small area of granulation tissue I placed silver  nitrate on   Assessment and Plan:     Doing great. Will return six months. Rx for prosthesis given    Return in about 6 months (around 12/05/2024).     MATTHEW CARLIN BURY, MD

## 2024-06-08 ENCOUNTER — Encounter (HOSPITAL_BASED_OUTPATIENT_CLINIC_OR_DEPARTMENT_OTHER): Attending: Internal Medicine | Admitting: Internal Medicine

## 2024-06-08 DIAGNOSIS — Z9012 Acquired absence of left breast and nipple: Secondary | ICD-10-CM | POA: Insufficient documentation

## 2024-06-08 DIAGNOSIS — C50412 Malignant neoplasm of upper-outer quadrant of left female breast: Secondary | ICD-10-CM | POA: Insufficient documentation

## 2024-06-16 ENCOUNTER — Inpatient Hospital Stay: Attending: Hematology | Admitting: Adult Health

## 2024-06-16 ENCOUNTER — Encounter: Payer: Self-pay | Admitting: Adult Health

## 2024-06-16 VITALS — BP 111/61 | HR 67 | Temp 98.7°F | Resp 17 | Ht 69.0 in | Wt 143.2 lb

## 2024-06-16 DIAGNOSIS — Z803 Family history of malignant neoplasm of breast: Secondary | ICD-10-CM | POA: Insufficient documentation

## 2024-06-16 DIAGNOSIS — C50412 Malignant neoplasm of upper-outer quadrant of left female breast: Secondary | ICD-10-CM | POA: Diagnosis present

## 2024-06-16 DIAGNOSIS — Z923 Personal history of irradiation: Secondary | ICD-10-CM | POA: Diagnosis not present

## 2024-06-16 DIAGNOSIS — Z9012 Acquired absence of left breast and nipple: Secondary | ICD-10-CM | POA: Insufficient documentation

## 2024-06-16 DIAGNOSIS — K8020 Calculus of gallbladder without cholecystitis without obstruction: Secondary | ICD-10-CM | POA: Diagnosis not present

## 2024-06-16 DIAGNOSIS — Z1231 Encounter for screening mammogram for malignant neoplasm of breast: Secondary | ICD-10-CM | POA: Diagnosis not present

## 2024-06-16 DIAGNOSIS — Z9221 Personal history of antineoplastic chemotherapy: Secondary | ICD-10-CM | POA: Insufficient documentation

## 2024-06-16 DIAGNOSIS — D649 Anemia, unspecified: Secondary | ICD-10-CM | POA: Diagnosis not present

## 2024-06-16 DIAGNOSIS — Z87891 Personal history of nicotine dependence: Secondary | ICD-10-CM | POA: Diagnosis not present

## 2024-06-16 DIAGNOSIS — Z8042 Family history of malignant neoplasm of prostate: Secondary | ICD-10-CM | POA: Diagnosis not present

## 2024-06-16 DIAGNOSIS — E876 Hypokalemia: Secondary | ICD-10-CM | POA: Insufficient documentation

## 2024-06-16 DIAGNOSIS — Z171 Estrogen receptor negative status [ER-]: Secondary | ICD-10-CM | POA: Diagnosis not present

## 2024-06-16 DIAGNOSIS — E7800 Pure hypercholesterolemia, unspecified: Secondary | ICD-10-CM | POA: Diagnosis not present

## 2024-06-16 NOTE — Progress Notes (Signed)
 [facility] Cancer Follow up:    Patient, [name] No address on file   DIAGNOSIS:  Cancer Staging  Malignant neoplasm of upper-outer quadrant of left breast in female, estrogen receptor negative (HCC) Staging form: Breast, AJCC 8th Edition - Clinical stage from 03/11/2022: Stage IIB (cT2, cN0, cM0, G3, ER-, PR-, HER2-) - Signed by Lanny Callander, MD on 03/19/2022 Stage prefix: Initial diagnosis Histologic grading system: 3 grade system - Pathologic stage from 05/14/2022: Stage IIA (pT2, pN0, cM0, G3, ER-, PR-, HER2-) - Signed by Lanny Callander, MD on 05/28/2022 Stage prefix: Initial diagnosis Histologic grading system: 3 grade system Residual tumor (R): R0 - Pathologic stage from 01/01/2024: Stage IIIA (pT3, pN0, cM0, G3, ER-, PR-, HER2-) - Unsigned Histopathologic type: Metaplastic carcinoma, NOS Stage prefix: Initial diagnosis Histologic grading system: 3 grade system    SUMMARY OF ONCOLOGIC HISTORY: Oncology History Overview Note   Cancer Staging  Malignant neoplasm of upper-outer quadrant of left breast in female, estrogen receptor negative (HCC) Staging form: Breast, AJCC 8th Edition - Clinical stage from 03/11/2022: Stage IIB (cT2, cN0, cM0, G3, ER-, PR-, HER2-) - Signed by Lanny Callander, MD on 03/19/2022    Malignant neoplasm of upper-outer quadrant of left breast in female, estrogen receptor negative (HCC)  03/08/2022 Mammogram   CLINICAL DATA:  33 year old female presenting for evaluation of a palpable lump in the left breast which she feels has increased in size since she first identified it. Her mother was diagnosed with breast cancer within the last 6 months. She also has family history of breast cancer in multiple aunts and her maternal grandmother.   EXAM: DIGITAL DIAGNOSTIC BILATERAL MAMMOGRAM WITH TOMOSYNTHESIS AND CAD; ULTRASOUND LEFT BREAST LIMITED  IMPRESSION: 1. There is a suspicious 3.7 cm mass in the left breast at 12 o'clock.   2.  No evidence of left  axillary lymphadenopathy.   3.  No evidence of malignancy in the right breast.   03/11/2022 Cancer Staging   Staging form: Breast, AJCC 8th Edition - Clinical stage from 03/11/2022: Stage IIB (cT2, cN0, cM0, G3, ER-, PR-, HER2-) - Signed by Lanny Callander, MD on 03/19/2022 Stage prefix: Initial diagnosis Histologic grading system: 3 grade system   03/11/2022 Initial Biopsy   Diagnosis Breast, left, needle core biopsy, 12:00 - METAPLASTIC CARCINOMA - SEE COMMENT  Microscopic Comment The biopsy has an invasive epithelial component admixed with chondroid deposition, consistent with a metaplastic carcinoma. Based on the biopsy, the carcinoma appears Nottingham grade 3 of 3 and measures 1.2 cm in greatest linear extent.  PROGNOSTIC INDICATORS Results: The tumor cells are NEGATIVE for Her2 (0). Estrogen Receptor: 0%, NEGATIVE Progesterone Receptor: 0%, NEGATIVE Proliferation Marker Ki67: 40%   03/15/2022 Initial Diagnosis   Malignant neoplasm of upper-outer quadrant of left breast in female, estrogen receptor negative (HCC)   03/28/2022 Genetic Testing   Negative hereditary cancer genetic testing: no pathogenic variants detected in [facility] BRCAPlus Panel or [employer] CustomNext-Cancer +RNAinsight Panel.  Report dates are 03/28/2022 and 03/31/2022. SABRA   The BRCAplus panel offered by [employer] and includes sequencing and deletion/duplication analysis for the following 8 genes: ATM, BRCA1, BRCA2, CDH1, CHEK2, PALB2, PTEN, and TP53.  The CustomNext-Cancer+RNAinsight panel offered by [employer] includes sequencing and rearrangement analysis for the following 47 genes:  APC, ATM, AXIN2, BARD1, BMPR1A, BRCA1, BRCA2, BRIP1, CDH1, CDK4, CDKN2A, CHEK2, DICER1, EPCAM, GREM1, HOXB13, MEN1, MLH1, MSH2, MSH3, MSH6, MUTYH, NBN, NF1, NF2, NTHL1, PALB2, PMS2, POLD1, POLE, PTEN, RAD51C, RAD51D, RECQL, RET, SDHA,  SDHAF2, SDHB, SDHC, SDHD, SMAD4, SMARCA4, STK11, TP53, TSC1, TSC2, and VHL.  RNA data is routinely analyzed  for use in variant interpretation for all genes.   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   05/14/2022 Cancer Staging   Staging form: Breast, AJCC 8th Edition - Pathologic stage from 05/14/2022: Stage IIA (pT2, pN0, cM0, G3, ER-, PR-, HER2-) - Signed by Lanny Callander, MD on 05/28/2022 Stage prefix: Initial diagnosis Histologic grading system: 3 grade system Residual tumor (R): R0 - None   06/14/2022 - 07/05/2022 Chemotherapy   Patient is on Treatment Plan : BREAST Pembrolizumab  (200) D1 + Carboplatin  (5) D1 + Paclitaxel  (80) D1,8,15 q21d X 4 cycles / Pembrolizumab  (200) D1 + AC D1 q21d x 4 cycles     06/14/2022 - 09/19/2022 Neo-Adjuvant Chemotherapy   Taxol , carboplatin , Keytruda  to be followed by Adriamycin , Cytoxan , Keytruda .  She was able to tolerate 9 cycles of weekly Taxol ; 4 cycles of every 3-week carboplatin  and Keytruda , and 1 cycle of Adriamycin  Cytoxan  and Keytruda .   01/16/2023 - 03/06/2023 Radiation Therapy   Plan Name: Breast_L_BH Site: Breast, Left Technique: 3D Mode: Photon Dose Per Fraction: 1.8 Gy Prescribed Dose (Delivered / Prescribed): 27 Gy / 27 Gy Prescribed Fxs (Delivered / Prescribed): 15 / 15   Plan Name: Breast_L:1 Site: Breast, Left Technique: 3D Mode: Photon Dose Per Fraction: 1.8 Gy Prescribed Dose (Delivered / Prescribed): 23.4 Gy / 23.4 Gy Prescribed Fxs (Delivered / Prescribed): 13 / 13   Plan Name: Breast_L_Bst Site: Breast, Left Technique: 3D Mode: Photon Dose Per Fraction: 2 Gy Prescribed Dose (Delivered / Prescribed): 10 Gy / 10 Gy Prescribed Fxs (Delivered / Prescribed): 5 / 5     11/25/2023 Relapse/Recurrence   Left breast needle core biopsy at 12:30 o'clock: metaplastic carcinoma, triple negative, 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     12/18/2023 Imaging   CT chest,  abdomen, and pelvis with contrast  IMPRESSION: 1. Skin thickening of the left breast with multiple calcifications and asymmetric soft tissue, compatible with known left breast  malignancy. 2. No convincing evidence of metastatic disease in the chest, abdomen or pelvis. 3. Crescentic soft tissue in the anterior mediastinum which conforms to underlying vasculature, favored to reflect thymic tissue suggest attention on follow-up imaging. 4. Cholelithiasis without findings of acute cholecystitis.   01/01/2024 Surgery   Left mastectomy - Dr. Ebbie: Metaplastic carcinoma with extensive necrosis 6.2 x 4.5 x 3.8 cm, ER negative, PR negative, Ki67 25%, HER2 negative (0+).  5 lymph nodes negative for metastasis.     CURRENT THERAPY: observation  INTERVAL HISTORY:  Discussed the use of AI scribe software for clinical note transcription with the patient, who gave verbal consent to proceed.  Gabrielle [name] 33 y.o. 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  She has been experiencing weight loss.  She has not had any lab testing recently and does not want to undergo lab testing.  She is unsure what her intake of food has been previously.  She says she eats 2 meals a day but is unsure how much she is eating.  She tells me that she is hungry.  Of note she underwent CT chest abdomen pelvis in January 2025 that demonstrated crescentic soft tissue in the anterior mediastinum favored to reflect thymic tissue however attention on follow-up was suggested.  She  undergoes Signatera testing most recently in June 2025 and this was negative.  She notes that she is fatigued and had concerns about a normocytic anemia that she had previously.  She does not want to have labs drawn today.  She also notes some achiness in her legs especially at night when she goes to fall asleep.  She still has a port in place.  She has not wanted to have this removed but is willing to  consider it.      Patient Active Problem List   Diagnosis Date Noted   S/P left mastectomy 01/01/2024   Other insomnia 08/18/2023   History of juvenile arthritis 04/10/2023   Nausea with vomiting 09/19/2022   Joint pain 09/19/2022   Encounter for antineoplastic chemotherapy 09/19/2022   Peripheral neuropathy 09/19/2022   Hypokalemia 09/19/2022   Port-A-Cath in place 06/14/2022   Genetic testing 03/29/2022   Family history of breast cancer 03/21/2022   Family history of prostate cancer 03/21/2022   Malignant neoplasm of upper-outer quadrant of left breast in female, estrogen receptor negative (HCC) 03/15/2022    is allergic to oxycodone .  MEDICAL HISTORY: Past Medical History:  Diagnosis Date   Anxiety    Arthritis    Bilateral Knees   Cancer (HCC) 03/11/2022   Left Breast   Family history of breast cancer 03/21/2022   Family history of prostate cancer 03/21/2022   Hypercholesteremia    Juvenile arthritis (HCC)    Neuropathy    Personal history of chemotherapy    Personal history of radiation therapy     SURGICAL HISTORY: Past Surgical History:  Procedure Laterality Date   AXILLARY SENTINEL NODE BIOPSY Left 05/14/2022   Procedure: LEFT AXILLARY SENTINEL NODE BIOPSY;  Surgeon: Ebbie Cough, MD;  Location: [facility] OR;  Service: General;  Laterality: Left;  GEN w/ PEC BLOCK   BREAST BIOPSY Left 03/11/2022   BREAST BIOPSY Left 11/25/2023   US  LT BREAST BX W LOC DEV 1ST LESION IMG BX SPEC US  GUIDE 11/25/2023 [facility] MAMMOGRAPHY   BREAST LUMPECTOMY WITH AXILLARY LYMPH NODE BIOPSY Left 04/16/2022   Procedure: LEFT BREAST LUMPECTOMY WITH LEFT AXILLARY SENTINEL LYMPH NODE BIOPSY;  Surgeon: Ebbie Cough, MD;  Location: [facility];  Service: General;  Laterality: Left;   DENTAL SURGERY     PORTACATH PLACEMENT Right 04/16/2022   Procedure: INSERTION PORT-A-CATH;  Surgeon: Ebbie Cough, MD;  Location: [facility];  Service: General;   Laterality: Right;   SENTINEL NODE BIOPSY Left 01/01/2024   Procedure: LEFT AXILLARY SENTINEL NODE BIOPSY;  Surgeon: Ebbie Cough, MD;  Location: [facility] OR;  Service: General;  Laterality: Left;   SIMPLE MASTECTOMY WITH AXILLARY SENTINEL NODE BIOPSY Left 01/01/2024   Procedure: LEFT MASTECTOMY;  Surgeon: Ebbie Cough, MD;  Location: [facility] OR;  Service: General;  Laterality: Left;    SOCIAL HISTORY: Social History   Socioeconomic History   Marital status: Single    Spouse name: Not on file   Number of children: 2   Years of education: Not on file   Highest education level: Not on file  Occupational History   Not on file  Tobacco Use   Smoking status: Former    Current packs/day: 0.00    Average packs/day: 0.3 packs/day for 3.0 years (0.8 ttl pk-yrs)    Types: Cigarettes    Start date: 01/17/2019    Quit date: 01/16/2022    Years since quitting: 2.4    Passive exposure: Past   Smokeless tobacco: Never  Vaping Use   Vaping status: Never Used  Substance and Sexual Activity   Alcohol use: Yes    Comment: social   Drug use: Not Currently    Types: Marijuana   Sexual activity: Yes    Birth control/protection: None  Other Topics Concern   Not on file  Social History Narrative   Right handed   Caffeine: not really   Social Drivers of Health   Financial Resource Strain: High Risk (03/20/2022)   Overall Financial Resource Strain (CARDIA)    Difficulty of Paying Living Expenses: Very hard  Food Insecurity: No Food Insecurity (01/01/2024)   Hunger Vital Sign    Worried About Running Out of Food in the Last Year: Never true    Ran Out of Food in the Last Year: Never true  Transportation Needs: No Transportation Needs (01/01/2024)   PRAPARE - Administrator, Civil Service (Medical): No    Lack of Transportation (Non-Medical): No  Physical Activity: Insufficiently Active (09/23/2018)   Received from [facility]   Exercise Vital Sign    Days of Exercise per Week:  2 days    Minutes of Exercise per Session: 30 min  Stress: Not on file  Social Connections: Unknown (01/01/2024)   Social Connection and Isolation Panel    Frequency of Communication with Friends and Family: More than three times a week    Frequency of Social Gatherings with Friends and Family: More than three times a week    Attends Religious Services: Not on file    Active Member of Clubs or Organizations: Not on file    Attends Banker Meetings: Patient declined    Marital Status: Never married  Intimate Partner Violence: Not At Risk (01/01/2024)   Humiliation, Afraid, Rape, and Kick questionnaire    Fear of Current or Ex-Partner: No    Emotionally Abused: No    Physically Abused: No    Sexually Abused: No    FAMILY HISTORY: Family History  Problem Relation Age of Onset   Breast cancer Mother 10   Other Mother        sleeping problems   Prostate cancer Father 72   Diabetes Brother    Breast cancer Paternal Grandmother        dx after 18   Prostate cancer Paternal Grandfather        dx 64s; metastatic   ADD / ADHD Son    Breast cancer Maternal Aunt 11   Breast cancer Paternal Aunt        dx unknown age   Prostate cancer Paternal Uncle        dx after 50   Cystic fibrosis Niece    Sleep apnea Neg Hx     Review of Systems  Constitutional:  Positive for fatigue. Negative for appetite change, chills, fever and unexpected weight change.  HENT:   Negative for hearing loss, lump/mass, mouth sores and trouble swallowing.   Eyes:  Negative for eye problems and icterus.  Respiratory:  Negative for chest tightness, cough and shortness of breath.   Cardiovascular:  Negative for chest pain, leg swelling and palpitations.  Gastrointestinal:  Negative for abdominal distention, abdominal pain, constipation, diarrhea, nausea and vomiting.  Endocrine: Negative for hot flashes.  Genitourinary:  Negative for difficulty urinating.   Musculoskeletal:  Negative for  arthralgias.  Skin:  Negative for itching and rash.  Neurological:  Negative for dizziness, extremity weakness, headaches and numbness.  Hematological:  Negative for adenopathy.  Does not bruise/bleed easily.  Psychiatric/Behavioral:  Negative for depression. The patient is not nervous/anxious.       PHYSICAL EXAMINATION  Vitals:   06/16/24 1054  BP: 111/61  Pulse: 67  Resp: 17  Temp: 98.7 F (37.1 C)  SpO2: 99%    Physical Exam Constitutional:      General: She is not in acute distress.    Appearance: Normal appearance. She is not toxic-appearing.  HENT:     Head: Normocephalic and atraumatic.     Mouth/Throat:     Mouth: Mucous membranes are moist.     Pharynx: Oropharynx is clear. No oropharyngeal exudate or posterior oropharyngeal erythema.  Eyes:     General: No scleral icterus. Cardiovascular:     Rate and Rhythm: Normal rate and regular rhythm.     Pulses: Normal pulses.     Heart sounds: Normal heart sounds.  Pulmonary:     Effort: Pulmonary effort is normal.     Breath sounds: Normal breath sounds.  Chest:     Comments: Right breast is benign left breast status postmastectomy there is small scabbing present at the lumpectomy site there are no nodules masses or signs of recurrence. Abdominal:     General: Abdomen is flat. Bowel sounds are normal. There is no distension.     Palpations: Abdomen is soft.     Tenderness: There is no abdominal tenderness.  Musculoskeletal:        General: No swelling.     Cervical back: Neck supple.  Lymphadenopathy:     Cervical: No cervical adenopathy.     Upper Body:     Right upper body: No supraclavicular or axillary adenopathy.     Left upper body: No supraclavicular or axillary adenopathy.  Skin:    General: Skin is warm and dry.     Findings: No rash.  Neurological:     General: No focal deficit present.     Mental Status: She is alert.  Psychiatric:        Mood and Affect: Mood normal.        Behavior: Behavior  normal.     LABORATORY DATA:  CBC    Component Value Date/Time   WBC 6.7 04/05/2024 1320   WBC 6.4 12/29/2023 0837   RBC 3.93 04/05/2024 1320   HGB 10.4 (L) 04/05/2024 1320   HCT 31.6 (L) 04/05/2024 1320   PLT 201 04/05/2024 1320   MCV 80.4 04/05/2024 1320   MCH 26.5 04/05/2024 1320   MCHC 32.9 04/05/2024 1320   RDW 15.5 04/05/2024 1320   LYMPHSABS 3.3 04/05/2024 1320   MONOABS 0.4 04/05/2024 1320   EOSABS 0.3 04/05/2024 1320   BASOSABS 0.0 04/05/2024 1320    CMP     Component Value Date/Time   NA 140 04/05/2024 1320   K 3.7 04/05/2024 1320   CL 108 04/05/2024 1320   CO2 27 04/05/2024 1320   GLUCOSE 79 04/05/2024 1320   BUN 15 04/05/2024 1320   CREATININE 0.75 04/05/2024 1320   CREATININE 0.68 04/10/2023 1021   CALCIUM  10.0 04/05/2024 1320   CALCIUM  NSERUM 10/24/2022 1114   PROT 7.2 04/05/2024 1320   ALBUMIN  4.1 04/05/2024 1320   AST 25 04/05/2024 1320   ALT 10 04/05/2024 1320   ALKPHOS 135 (H) 04/05/2024 1320   BILITOT 0.3 04/05/2024 1320   GFRNONAA >60 04/05/2024 1320     ASSESSMENT and THERAPY PLAN:   Zanovia is a 33 year old woman with recurrent stage IIIa triple negative breast  cancer.  She was initially diagnosed with breast cancer in April 2023 she underwent lumpectomy followed by adjuvant chemotherapy which she had difficulty tolerating the entirety of, adjuvant radiation, followed by recurrence in January 2025 for which she underwent mastectomy in February 2025.  Stage IIIA recurrent breast cancer.  No clinical signs of recurrence.  Most recent Signatera in June 2025 negative.  Most recent CT chest abdomen pelvis in January negative.  She will continue on observation. Weight loss: She does not want to undergo any lab testing today.  I would like to repeat CBC, c-Met, TSH, and free T4.  We agreed that she will keep dietary records and track her intake to ensure she is getting enough in every day and that she feels hungry.  If she is still losing weight at  her next appointment she will agree to labs at that time. Leg pain at night: I suggested she take magnesium  400 mg nightly. Brain fog: I recommended vitamin D3 1000 units daily. Normocytic anemia: I would like to obtain labs to evaluate for nutritional deficiencies that can lead to anemia however she declines today.  I suggested she take vitamin B12 1000 mcg sublingual daily. Port-A-Cath in place: She agrees to reach out to Dr. Gelene office to get port removal scheduled.  She understands that having the port again increases her risk for infection along with a port associated blood clot.  She continues to decline having the port flushed. CT chest in January demonstrated crescentic soft tissue in the anterior mediastinum and attention was recommended on follow-up imaging.  She is agreeable to undergo CT chest to follow-up on this incidental finding.  She will return in 12 weeks for survivorship care plan visit.  All questions were answered. The patient knows to call the clinic with any problems, questions or concerns. We can certainly see the patient much sooner if necessary.  Total encounter time:60 minutes*in face-to-face visit time, chart review, lab review, care coordination, order entry, and documentation of the encounter time.    Morna Kendall, NP 06/16/24 5:09 PM Medical Oncology and Hematology [facility] [address] [place], [place] [address] Tel. [phone]    Fax. [phone]  *Total Encounter Time as defined by the Centers for Medicare and Medicaid Services includes, in addition to the face-to-face time of a patient visit (documented in the note above) non-face-to-face time: obtaining and reviewing outside history, ordering and reviewing medications, tests or procedures, care coordination (communications with other health care professionals or caregivers) and documentation in the medical record.

## 2024-06-24 ENCOUNTER — Ambulatory Visit (HOSPITAL_COMMUNITY)

## 2024-07-05 ENCOUNTER — Other Ambulatory Visit: Payer: Self-pay | Admitting: Medical Genetics

## 2024-07-15 ENCOUNTER — Ambulatory Visit (HOSPITAL_COMMUNITY): Attending: Adult Health

## 2024-08-05 NOTE — Progress Notes (Signed)
 Subjective:  Patient ID: Gabrielle [name], female    DOB: 1991/01/07, 33 y.o.   MRN: [id number]  Patient Care Team: Deitra Morton Hummer, Nena, NP as PCP - General (Nurse Practitioner) Tyree Nanetta SAILOR, RN as Oncology Nurse Navigator Ebbie Cough, MD as Consulting Physician (General Surgery) Lanny Callander, MD as Consulting Physician (Hematology) Dewey [name], MD as Consulting Physician (Radiation Oncology) Heide Ingle, MD as Consulting Physician (Orthopedic Surgery) Neomi Johnston ONEIDA DEVONNA as Physician Assistant (Hematology and Oncology) Imaging, [facility] as Radiologist (Diagnostic Radiology)   Chief Complaint:  Establish Care   HPI: Gabrielle [name] is a 33 y.o. female presenting on 08/09/2024 for Establish Care   Discussed the use of AI scribe software for clinical note transcription with the patient, who gave verbal consent to proceed.  History of Present Illness Gabrielle [name] is a 33 year old female who presents 08/09/2024  to establish care and address her anxiety.  She experiences severe anxiety and panic attacks, describing her anxiety as 'very bad' and significantly impacting her daily life. She completed an anxiety questionnaire but feels her responses did not accurately reflect her condition. She is seeking medication to help manage her anxiety.  She has a history of breast cancer, having been diagnosed twice. Her left breast was removed during her last treatment. She is currently in remission but undergoes regular testing due to the nature of her cancer. She still has a chemo port on the right side of her chest, which was last accessed in 2023. She is awaiting further instructions from her surgeon regarding its removal.  She experiences leg pain, particularly at night, and suspects she may have restless leg syndrome. The pain disrupts her sleep, causing her to toss and turn. The pain is worse when sitting or lying down and improves with movement. She has  been taking gabapentin  since 2023, but she feels it is not effectively managing her symptoms. She takes it twice every other day as needed.  She mentions being anemic, as identified through regular blood tests at the cancer center. Her doctor noted low iron  levels, but she has not been prescribed an iron  supplement. She experiences fatigue, which she attributes to her anemia.  She quit smoking in 2024 and previously smoked infrequently. She has also experienced significant weight loss, losing over 100 pounds during chemotherapy. Her current weight is 148 pounds, and she has been trying to maintain it by consuming protein-rich foods. Her last menstrual cycle was at the beginning of September 2025, lasting three days.       08/09/2024    3:29 PM 08/09/2024    3:26 PM  GAD 7 : Generalized Anxiety Score  Nervous, Anxious, on Edge 3 2  Control/stop worrying 0 0  Worry too much - different things 0 0  Trouble relaxing 3 0  Restless 0 0  Easily annoyed or irritable 0 0  Afraid - awful might happen 0 0  Total GAD 7 Score 6 2  Anxiety Difficulty Somewhat difficult Not difficult at all        08/09/2024    3:25 PM  PHQ9 SCORE ONLY  PHQ-9 Total Score 4     Relevant past medical, surgical, family, and social history reviewed and updated as indicated.  Allergies and medications reviewed and updated. Data reviewed: Chart in Epic.   Past Medical History:  Diagnosis Date   Anxiety    Arthritis    Bilateral Knees   Cancer (HCC)  03/11/2022   Left Breast   Family history of breast cancer 03/21/2022   Family history of prostate cancer 03/21/2022   Hypercholesteremia    Juvenile arthritis (HCC)    Neuropathy    Personal history of chemotherapy    Personal history of radiation therapy     Past Surgical History:  Procedure Laterality Date   AXILLARY SENTINEL NODE BIOPSY Left 05/14/2022   Procedure: LEFT AXILLARY SENTINEL NODE BIOPSY;  Surgeon: Ebbie Cough, MD;  Location: [facility] OR;   Service: General;  Laterality: Left;  GEN w/ PEC BLOCK   BREAST BIOPSY Left 03/11/2022   BREAST BIOPSY Left 11/25/2023   US  LT BREAST BX W LOC DEV 1ST LESION IMG BX SPEC US  GUIDE 11/25/2023 [facility]   BREAST LUMPECTOMY WITH AXILLARY LYMPH NODE BIOPSY Left 04/16/2022   Procedure: LEFT BREAST LUMPECTOMY WITH LEFT AXILLARY SENTINEL LYMPH NODE BIOPSY;  Surgeon: Ebbie Cough, MD;  Location: [facility];  Service: General;  Laterality: Left;   DENTAL SURGERY     PORTACATH PLACEMENT Right 04/16/2022   Procedure: INSERTION PORT-A-CATH;  Surgeon: Ebbie Cough, MD;  Location:  SURGERY CENTER;  Service: General;  Laterality: Right;   SENTINEL NODE BIOPSY Left 01/01/2024   Procedure: LEFT AXILLARY SENTINEL NODE BIOPSY;  Surgeon: Ebbie Cough, MD;  Location: [facility] OR;  Service: General;  Laterality: Left;   SIMPLE MASTECTOMY WITH AXILLARY SENTINEL NODE BIOPSY Left 01/01/2024   Procedure: LEFT MASTECTOMY;  Surgeon: Ebbie Cough, MD;  Location: [facility] OR;  Service: General;  Laterality: Left;    Social History   Socioeconomic History   Marital status: Single    Spouse name: Not on file   Number of children: 2   Years of education: Not on file   Highest education level: Not on file  Occupational History   Not on file  Tobacco Use   Smoking status: Former    Current packs/day: 0.00    Average packs/day: 0.3 packs/day for 3.0 years (0.8 ttl pk-yrs)    Types: Cigarettes    Start date: 01/17/2019    Quit date: 01/16/2022    Years since quitting: 2.5    Passive exposure: Past   Smokeless tobacco: Never  Vaping Use   Vaping status: Never Used  Substance and Sexual Activity   Alcohol use: Yes    Comment: social   Drug use: Not Currently    Types: Marijuana   Sexual activity: Yes    Birth control/protection: None  Other Topics Concern   Not on file  Social History Narrative   Right handed   Caffeine: not really   Social Drivers of Health    Financial Resource Strain: High Risk (03/20/2022)   Overall Financial Resource Strain (CARDIA)    Difficulty of Paying Living Expenses: Very hard  Food Insecurity: No Food Insecurity (01/01/2024)   Hunger Vital Sign    Worried About Running Out of Food in the Last Year: Never true    Ran Out of Food in the Last Year: Never true  Transportation Needs: No Transportation Needs (01/01/2024)   PRAPARE - Administrator, Civil Service (Medical): No    Lack of Transportation (Non-Medical): No  Physical Activity: Insufficiently Active (09/23/2018)   Received from [facility]   Exercise Vital Sign    Days of Exercise per Week: 2 days    Minutes of Exercise per Session: 30 min  Stress: Not on file  Social Connections: Unknown (01/01/2024)   Social Connection and Isolation  Panel    Frequency of Communication with Friends and Family: More than three times a week    Frequency of Social Gatherings with Friends and Family: More than three times a week    Attends Religious Services: Not on file    Active Member of Clubs or Organizations: Not on file    Attends Banker Meetings: Patient declined    Marital Status: Never married  Intimate Partner Violence: Not At Risk (01/01/2024)   Humiliation, Afraid, Rape, and Kick questionnaire    Fear of Current or Ex-Partner: No    Emotionally Abused: No    Physically Abused: No    Sexually Abused: No    Outpatient Encounter Medications as of 08/09/2024  Medication Sig   DULoxetine  (CYMBALTA ) 20 MG capsule Take 1 capsule (20 mg total) by mouth daily.   [DISCONTINUED] gabapentin  (NEURONTIN ) 300 MG capsule Take 1 capsule (300 mg total) by mouth in the morning and at bedtime.   [DISCONTINUED] LORazepam  (ATIVAN ) 1 MG tablet Take 0.5-1 tablets (0.5-1 mg total) by mouth every 8 (eight) hours as needed for anxiety. Take 1 tab 1 hour before CT scan or breast biopsy, if not enough, OK to take second dose before CT (Patient not taking: Reported  on 08/09/2024)   [DISCONTINUED] meloxicam  (MOBIC ) 15 MG tablet Take 1 tablet (15 mg total) by mouth daily. (Patient not taking: Reported on 08/09/2024)   [DISCONTINUED] methocarbamol  (ROBAXIN ) 500 MG tablet Take 1 tablet (500 mg total) by mouth 4 (four) times daily. (Patient not taking: Reported on 08/09/2024)   [DISCONTINUED] traMADol  (ULTRAM ) 50 MG tablet Take 50 mg by mouth. (Patient not taking: Reported on 08/09/2024)   Facility-Administered Encounter Medications as of 08/09/2024  Medication   diphenhydrAMINE  (BENADRYL ) 50 MG/ML injection   famotidine  (PEPCID ) 20-0.9 MG/50ML-% IVPB    Allergies  Allergen Reactions   Oxycodone  Itching    Pertinent ROS per HPI, otherwise unremarkable      Objective:  BP 93/61   Pulse 68   Temp (!) 97.5 F (36.4 C) (Temporal)   Ht 5' 9 (1.753 m)   Wt 147 lb 12.8 oz (67 kg)   SpO2 100%   BMI 21.83 kg/m    Wt Readings from Last 3 Encounters:  08/09/24 147 lb 12.8 oz (67 kg)  06/16/24 143 lb 3.2 oz (65 kg)  04/05/24 153 lb 11.2 oz (69.7 kg)    Physical Exam Vitals and nursing note reviewed.  Constitutional:      General: She is not in acute distress. HENT:     Head: Normocephalic and atraumatic.     Right Ear: Tympanic membrane, ear canal and external ear normal. There is no impacted cerumen.     Left Ear: Tympanic membrane, ear canal and external ear normal. There is no impacted cerumen.     Nose: Nose normal.     Mouth/Throat:     Mouth: Mucous membranes are moist.  Eyes:     General: No scleral icterus.    Conjunctiva/sclera: Conjunctivae normal.     Pupils: Pupils are equal, round, and reactive to light.  Neck:     Vascular: No carotid bruit.  Cardiovascular:     Heart sounds: Normal heart sounds.  Pulmonary:     Effort: Pulmonary effort is normal.     Breath sounds: Normal breath sounds.  Abdominal:     General: Bowel sounds are normal.     Palpations: Abdomen is soft.  Musculoskeletal:        General:  Normal range of  motion.     Cervical back: Normal range of motion and neck supple. No rigidity or tenderness.     Right lower leg: No edema.     Left lower leg: No edema.  Lymphadenopathy:     Cervical: No cervical adenopathy.  Skin:    General: Skin is warm and dry.     Findings: No rash.  Neurological:     Mental Status: She is alert and oriented to person, place, and time.     Gait: Gait is intact.  Psychiatric:        Mood and Affect: Mood normal.        Thought Content: Thought content normal.        Judgment: Judgment normal.    Physical Exam MEASUREMENTS: Weight- 148.     Results for orders placed or performed in visit on 04/05/24  Genetic Screening Order   Collection Time: 04/05/24  1:03 PM  Result Value Ref Range   Genetic Screening Order Collected by Laboratory   CBC with Differential (Cancer Center Only)   Collection Time: 04/05/24  1:20 PM  Result Value Ref Range   WBC Count 6.7 4.0 - 10.5 K/uL   RBC 3.93 3.87 - 5.11 MIL/uL   Hemoglobin 10.4 (L) 12.0 - 15.0 g/dL   HCT 68.3 (L) 63.9 - 53.9 %   MCV 80.4 80.0 - 100.0 fL   MCH 26.5 26.0 - 34.0 pg   MCHC 32.9 30.0 - 36.0 g/dL   RDW 84.4 88.4 - 84.4 %   Platelet Count 201 150 - 400 K/uL   nRBC 0.0 0.0 - 0.2 %   Neutrophils Relative % 40 %   Neutro Abs 2.7 1.7 - 7.7 K/uL   Lymphocytes Relative 50 %   Lymphs Abs 3.3 0.7 - 4.0 K/uL   Monocytes Relative 6 %   Monocytes Absolute 0.4 0.1 - 1.0 K/uL   Eosinophils Relative 4 %   Eosinophils Absolute 0.3 0.0 - 0.5 K/uL   Basophils Relative 0 %   Basophils Absolute 0.0 0.0 - 0.1 K/uL   Immature Granulocytes 0 %   Abs Immature Granulocytes 0.02 0.00 - 0.07 K/uL  CMP (Cancer Center only)   Collection Time: 04/05/24  1:20 PM  Result Value Ref Range   Sodium 140 135 - 145 mmol/L   Potassium 3.7 3.5 - 5.1 mmol/L   Chloride 108 98 - 111 mmol/L   CO2 27 22 - 32 mmol/L   Glucose, Bld 79 70 - 99 mg/dL   BUN 15 6 - 20 mg/dL   Creatinine 9.24 9.55 - 1.00 mg/dL   Calcium  10.0 8.9 -  10.3 mg/dL   Total Protein 7.2 6.5 - 8.1 g/dL   Albumin  4.1 3.5 - 5.0 g/dL   AST 25 15 - 41 U/L   ALT 10 0 - 44 U/L   Alkaline Phosphatase 135 (H) 38 - 126 U/L   Total Bilirubin 0.3 0.0 - 1.2 mg/dL   GFR, Estimated >39 >39 mL/min   Anion gap 5 5 - 15  Cancer antigen 27.29   Collection Time: 04/05/24  1:20 PM  Result Value Ref Range   CA 27.29 <9.0 0.0 - 38.6 U/mL  Signatera   Collection Time: 05/04/24 12:27 AM  Result Value Ref Range   SIGNATERA TEST RESULT NEGATIVE    SIGNATERA MTM READOUT 0 MTM/ml       Pertinent labs & imaging results that were available during my care of the patient were  reviewed by me and considered in my medical decision making.  Assessment & Plan:  Gabrielle [name] was seen today for establish care.  Diagnoses and all orders for this visit:  Encounter for general adult medical examination with abnormal findings -     CMP14+EGFR -     Lipid panel -     Thyroid  Panel With TSH -     Bayer DCA Hb A1c Waived  Anemia, unspecified type -     Anemia Profile B  Recurrent breast cancer, left (HCC)  GAD (generalized anxiety disorder) -     Thyroid  Panel With TSH -     DULoxetine  (CYMBALTA ) 20 MG capsule; Take 1 capsule (20 mg total) by mouth daily.    Acsa is a 33 year old African-American female seen today to establish care, no acute distress Assessment and Plan Assessment & Plan Breast cancer, status post left mastectomy, in remission, under surveillance Currently in remission following left mastectomy, under surveillance due to cancer nature. Chemo port on right chest, last accessed in 2023, awaiting surgeon's decision on removal. - Continue regular surveillance with oncologist. - Follow up with surgeon regarding port removal.  Anemia, likely iron  deficiency, pending lab confirmation Anemia with low iron  levels noted during cancer center visits. Symptoms include fatigue and low blood pressure, possibly correlating with anemia. - Order full anemia panel. -  Consider iron  supplementation or referral for iron  infusion based on lab results.  Chronic leg pain, possible restless legs syndrome Chronic leg pain, particularly at night, suggestive of restless legs syndrome. Gabapentin  ineffective and may exacerbate anxiety. - Discontinue gabapentin . - Initiate Cymbalta  20 mg for leg pain.  Anxiety symptoms Significant anxiety and panic attacks reported, though anxiety score is low. Anxiety may be exacerbated by gabapentin . - Discontinue gabapentin , start Cymbalta  20 mg daily  Adult Wellness Visit Routine adult wellness visit to establish care. Discussed general health maintenance, including the need for a Pap smear. - Schedule Pap smear in 4- months. - Perform complete physical exam in four months.  -Labs: Anemia panel, CMP, lipid, TSH result pending    Continue all other maintenance medications.  Follow up plan: Return in about 4 months (around 12/09/2024) for physical with pap.   Continue healthy lifestyle choices, including diet (rich in fruits, vegetables, and lean proteins, and low in salt and simple carbohydrates) and exercise (at least 30 minutes of moderate physical activity daily).  Educational handout given for  linical References  Anemia  Anemia is a condition in which there are not enough red blood cells or hemoglobin in the blood. Hemoglobin is a substance in red blood cells that carries oxygen. When you do not have enough red blood cells or hemoglobin (are anemic), your body cannot get enough oxygen, and your organs may not work properly. As a result, you may feel very tired or have other problems. What are the causes? Common causes of anemia include: Excessive bleeding. Anemia can be caused by excessive bleeding inside or outside the body, including bleeding from the intestines or from heavy menstrual periods in females. Poor nutrition. Long-lasting (chronic) kidney, thyroid , and liver disease. Bone marrow disorders, spleen  problems, and blood disorders. Cancer and treatments for cancer. Human immunodeficiency virus (HIV) and acquired immunodeficiency syndrome (AIDS). Infections, medicines, and autoimmune disorders that destroy red blood cells. What are the signs or symptoms? Symptoms of this condition include: Minor weakness. Dizziness. Headache, or difficulties concentrating and sleeping. Heartbeats that feel irregular or faster than normal (palpitations). Shortness of breath, especially with exercise.  Pale skin, lips, and nails, or cold hands and feet. Upset stomach (indigestion) and nausea. Symptoms may occur suddenly or develop slowly. If your anemia is mild, you may not have symptoms. How is this diagnosed? This condition is diagnosed based on blood tests, your medical history, and a physical exam. In some cases, a test may be needed in which cells are removed from the soft tissue inside of a bone and looked at under a microscope (bone marrow biopsy). Your health care provider may also check your stool (feces) for blood and may do more testing to look for the cause of your bleeding. Other tests may include: Imaging tests, such as a CT scan or MRI. A procedure to see inside your esophagus and stomach (endoscopy). The esophagus is the part of the body that moves food from your mouth to your stomach. A procedure to see inside your colon and rectum (colonoscopy). How is this treated? Treatment for this condition depends on the cause. If you continue to lose a lot of blood, you may need to be treated at a hospital. Treatment may include: Taking supplements of iron , vitamin B12, or folic acid . Taking a hormone medicine (erythropoietin) that can help to stimulate red blood cell growth. Receiving donated blood through an IV (blood transfusion). This may be needed if you lose a lot of blood. Making changes to your diet. Having surgery to remove your spleen. Follow these instructions at home: Take  over-the-counter and prescription medicines only as told by your health care provider. Take supplements only as told by your health care provider. Follow any diet instructions that you were given by your health care provider. Keep all follow-up visits. Your health care provider will want to recheck your blood tests. Contact a health care provider if: You develop new bleeding anywhere in the body. You are very weak. Get help right away if: You are short of breath. You have pain in your abdomen or chest. You are dizzy or feel faint. You have trouble concentrating. You have bloody stools, black stools, or tarry stools. You vomit repeatedly or you vomit up blood. These symptoms may be an emergency. Get help right away. Call 911. Do not wait to see if the symptoms will go away. Do not drive yourself to the hospital. Summary Anemia is a condition in which you do not have enough red blood cells or enough of a substance in your red blood cells that carries oxygen. Symptoms may occur suddenly or develop slowly. If your anemia is mild, you may not have symptoms. This condition is diagnosed with blood tests, a medical history, and a physical exam. Other tests may be needed. Treatment for this condition depends on the cause of the anemia. This information is not intended to replace advice given to you by your health care provider. Make sure you discuss any questions you have with your health care provider. Document Revised: 01/28/2022 Document Reviewed: 01/28/2022 Elsevier Patient Education  2024 [employer] Medical Screening Exam A medical screening exam (MSE) helps to determine whether you need immediate medical treatment relating to any number of symptoms you are having. This type of exam may be done in an emergency department, an urgent care setting, or your health care provider's office. Depending on your symptoms and severity, you may need additional tests or medical therapy. It is  important to note that an MSE does not necessarily mean that you will need or receive further medical testing or interventions if your symptoms are not deemed  to be medically urgent (emergent). Tell a health care provider about: Any allergies you have. All medicines you are taking, including vitamins, herbs, eye drops, creams, and over-the-counter medicines. Any problems you or family members have had with anesthetic medicines. Any bleeding problems you have. Any surgeries you have had. Any medical conditions you have. Whether you are pregnant or may be pregnant. What happens during the test? During the exam, a health care provider does a short, often focused, physical exam and asks about your medical history to assess: Your current symptoms. Your overall health. Your need for possible further medical intervention. What can I expect after the test? If you have a regular health care provider, make an appointment for a follow-up visit with him or her. If you do not have a regular health care provider, ask about resources in your community. Your medical screening exam may determine that: You do not need emergency treatment at this time. You need treatment right away. You need to be transferred to another medical center. This may happen if you need an emergent specialist or consultant that is not available at the medical center you are at. You need to have more tests. A medical specialist may be consulted if needed. Get help right away if: Your condition gets worse. You develop new or troubling symptoms before you see your health care provider. These symptoms may represent a serious problem that is an emergency. Do not wait to see if the symptoms will go away. Get medical help right away. Call your local emergency services (911 in the [place] Do not drive yourself to the hospital. Summary A medical screening exam helps to determine whether you need medical treatment right away. This type of  exam may be done in an emergency department, an urgent care setting, or your health care provider's office. During the exam, a health care provider does a short physical exam and asks about your current symptoms and overall health. Depending on the exam, more tests or therapies may be ordered. However, an MSE does not necessarily mean that you will have further medical testing if your symptoms are not deemed to be urgent. If you need further care that is not offered at your current medical center, you may need to be transferred to another facility. This information is not intended to replace advice given to you by your health care provider. Make sure you discuss any questions you have with your health care provider. Document Revised: 07/18/2021 Document Reviewed: 03/15/2021 Elsevier Patient Education  2024 [employer]. Managing Anxiety, Adult After being diagnosed with anxiety, you may be relieved to know why you have felt or behaved a certain way. You may also feel overwhelmed about the treatment ahead and what it will mean for your life. With care and support, you can manage your anxiety. How to manage lifestyle changes Understanding the difference between stress and anxiety Although stress can play a role in anxiety, it is not the same as anxiety. Stress is your body's reaction to life changes and events, both good and bad. Stress is often caused by something external, such as a deadline, test, or competition. It normally goes away after the event has ended and will last just a few hours. But, stress can be ongoing and can lead to more than just stress. Anxiety is caused by something internal, such as imagining a terrible outcome or worrying that something will go wrong that will greatly upset you. Anxiety often does not go away even after the event  is over, and it can become a long-term (chronic) worry. Lowering stress and anxiety Talk with your health care provider or a counselor to learn more  about lowering anxiety and stress. They may suggest tension-reduction techniques, such as: Music. Spend time creating or listening to music that you enjoy and that inspires you. Mindfulness-based meditation. Practice being aware of your normal breaths while not trying to control your breathing. It can be done while sitting or walking. Centering prayer. Focus on a word, phrase, or sacred image that means something to you and brings you peace. Deep breathing. Expand your stomach and inhale slowly through your nose. Hold your breath for 3-5 seconds. Then breathe out slowly, letting your stomach muscles relax. Self-talk. Learn to notice and spot thought patterns that lead to anxiety reactions. Change those patterns to thoughts that feel peaceful. Muscle relaxation. Take time to tense muscles and then relax them. Choose a tension-reduction technique that fits your lifestyle and personality. These techniques take time and practice. Set aside 5-15 minutes a day to do them. Specialized therapists can offer counseling and training in these techniques. The training to help with anxiety may be covered by some insurance plans. Other things you can do to manage stress and anxiety include: Keeping a stress diary. This can help you learn what triggers your reaction and then learn ways to manage your response. Thinking about how you react to certain situations. You may not be able to control everything, but you can control your response. Making time for activities that help you relax and not feeling guilty about spending your time in this way. Doing visual imagery. This involves imagining or creating mental pictures to help you relax. Practicing yoga. Through yoga poses, you can lower tension and relax.   Medicines Medicines for anxiety include: Antidepressant medicines. These are usually prescribed for long-term daily control. Anti-anxiety medicines. These may be added in severe cases, especially when panic  attacks occur. When used together, medicines, psychotherapy, and tension-reduction techniques may be the most effective treatment. Relationships Relationships can play a big part in helping you recover. Spend more time connecting with trusted friends and family members. Think about going to couples counseling if you have a partner, taking family education classes, or going to family therapy. Therapy can help you and others better understand your anxiety. How to recognize changes in your anxiety Everyone responds differently to treatment for anxiety. Recovery from anxiety happens when symptoms lessen and stop interfering with your daily life at home or work. This may mean that you will start to: Have better concentration and focus. Worry will interfere less in your daily thinking. Sleep better. Be less irritable. Have more energy. Have improved memory. Try to recognize when your condition is getting worse. Contact your provider if your symptoms interfere with home or work and you feel like your condition is not improving. Follow these instructions at home: Activity Exercise. Adults should: Exercise for at least 150 minutes each week. The exercise should increase your heart rate and make you sweat (moderate-intensity exercise). Do strengthening exercises at least twice a week. Get the right amount and quality of sleep. Most adults need 7-9 hours of sleep each night. Lifestyle  Eat a healthy diet that includes plenty of vegetables, fruits, whole grains, low-fat dairy products, and lean protein. Do not eat a lot of foods that are high in fats, added sugars, or salt (sodium). Make choices that simplify your life. Do not use any products that contain nicotine or tobacco. These  products include cigarettes, chewing tobacco, and vaping devices, such as e-cigarettes. If you need help quitting, ask your provider. Avoid caffeine, alcohol, and certain over-the-counter cold medicines. These may make you  feel worse. Ask your pharmacist which medicines to avoid. General instructions Take over-the-counter and prescription medicines only as told by your provider. Keep all follow-up visits. This is to make sure you are managing your anxiety well or if you need more support. Where to find support You can get help and support from: Self-help groups. Online and [employer]. A trusted spiritual leader. Couples counseling. Family education classes. Family therapy. Where to find more information You may find that joining a support group helps you deal with your anxiety. The following sources can help you find counselors or support groups near you: Mental Health America: mentalhealthamerica.net Anxiety and Depression Association of [place] (ADAA): adaa.org The [employer] on Mental Illness (NAMI): nami.org Contact a health care provider if: You have a hard time staying focused or finishing tasks. You spend many hours a day feeling worried about everyday life. You are very tired because you cannot stop worrying. You start to have headaches or often feel tense. You have chronic nausea or diarrhea. Get help right away if: Your heart feels like it is racing. You have shortness of breath. You have thoughts of hurting yourself or others. Get help right away if you feel like you may hurt yourself or others, or have thoughts about taking your own life. Go to your nearest emergency room or: Call 911. Call the National Suicide Prevention Lifeline at 603-876-8743 or 988. This is open 24 hours a day. Text the Crisis Text Line at 561 175 9274. This information is not intended to replace advice given to you by your health care provider. Make sure you discuss any questions you have with your health care provider. Document Revised: 08/13/2022 Document Reviewed: 02/25/2021 Elsevier Patient Education  2024 Else[employer] Inc. Mindfulness-Based Stress Reduction: What to Know Mindfulness-based stress  reduction (MBSR) is a mindfulness meditation program that normally takes place over 8 weeks. It usually includes weekly group classes and daily exercises to do at home. What are the benefits of MBSR? Mindfulness meditation therapies, like MBSR, can change a person's brain and body in good ways, and make them healthier. MBSR can have many benefits, such as: Helping to lower stress hormones. Decreasing symptoms or helping to deal with symptoms of different conditions, like: Anxiety, which is feeling worried or nervous. Long-lasting pain. This is pain that lasts more than 3 months. Stress and worry. Trouble sleeping. Headaches, like migraines and tension headaches. Irritable bowel syndrome. Helping to handle stress from things you can't control, like: Long-term illnesses, especially if you have a lot of pain or other difficult symptoms. Big life events. Stress at work. Stress from taking care of someone else. Types of MBSR exercises Mindfulness. This is a common type of meditation. Meditation. It helps you focus your mind to feel calm and happy. It has two main parts: paying attention and accepting. Paying attention means focusing on what is happening right now. This usually means noticing your breathing, your thoughts, how your body feels, and your emotions. Accepting means noticing these feelings and sensations without judging them. Instead of reacting to these thoughts or feelings, you just observe them and let them pass. MSBR exercises include: Body scanning. This is a mindfulness exercise where you pay attention to how different parts of your body feel. You can do this while lying down or sitting up. Sitting meditations. In  this exercise, you focus on something like your breathing. When your mind starts to wander, gently bring it back to your breath. Keep doing this every time you notice your mind wandering. Mindful movements. This exercise involves moving and stretching your body  slowly while paying attention to how it feels. Mindful Tasks. This means paying attention to how your body feels while doing things like walking or eating. Follow these instructions at home:  Find an in-person MBSR program or find a program that is online. Find a podcast or recording that provides guidance for MSBR exercises. Look for a therapist who knows how to use MBSR. Follow your treatment plan as told by your health care provider. This may include taking regular medicines and making changes to your diet or lifestyle. Where to find more information You can find more information about MBSR from: Your provider. Community-based meditation centers or programs. American Psychological Association at http://forbes-duran.com/. This information is not intended to replace advice given to you by your health care provider. Make sure you discuss any questions you have with your health care provider. Document Revised: 01/08/2024 Document Reviewed: 01/08/2024 Elsevier Patient Education  [employer].  The above assessment and management plan was discussed with the patient. The patient verbalized understanding of and has agreed to the management plan. Patient is aware to call the clinic if they develop any new symptoms or if symptoms persist or worsen. Patient is aware when to return to the clinic for a follow-up visit. Patient educated on when it is appropriate to go to the emergency department.  Jackye Dever [place] Thompson, DNP [facility] [address] [place], [place] [address] [phone]

## 2024-08-09 ENCOUNTER — Ambulatory Visit (INDEPENDENT_AMBULATORY_CARE_PROVIDER_SITE_OTHER): Admitting: Nurse Practitioner

## 2024-08-09 ENCOUNTER — Encounter: Payer: Self-pay | Admitting: Nurse Practitioner

## 2024-08-09 VITALS — BP 93/61 | HR 68 | Temp 97.5°F | Ht 69.0 in | Wt 147.8 lb

## 2024-08-09 DIAGNOSIS — Z0001 Encounter for general adult medical examination with abnormal findings: Secondary | ICD-10-CM | POA: Diagnosis not present

## 2024-08-09 DIAGNOSIS — C50912 Malignant neoplasm of unspecified site of left female breast: Secondary | ICD-10-CM

## 2024-08-09 DIAGNOSIS — D649 Anemia, unspecified: Secondary | ICD-10-CM | POA: Diagnosis not present

## 2024-08-09 DIAGNOSIS — E782 Mixed hyperlipidemia: Secondary | ICD-10-CM | POA: Insufficient documentation

## 2024-08-09 DIAGNOSIS — F411 Generalized anxiety disorder: Secondary | ICD-10-CM

## 2024-08-09 LAB — LIPID PANEL

## 2024-08-09 LAB — BAYER DCA HB A1C WAIVED: HB A1C (BAYER DCA - WAIVED): 4.6 % — ABNORMAL LOW (ref 4.8–5.6)

## 2024-08-09 MED ORDER — DULOXETINE HCL 20 MG PO CPEP: 20.0000 mg | ORAL_CAPSULE | Freq: Every day | ORAL | 0 refills | Status: DC

## 2024-08-10 LAB — LIPID PANEL
Cholesterol, Total: 184 mg/dL (ref 100–199)
HDL: 43 mg/dL (ref >39)
LDL CALC COMMENT:: 4.3 ratio (ref 0.0–4.4)
LDL Chol Calc (NIH): 119 mg/dL — AB (ref 0–99)
Triglycerides: 123 mg/dL (ref 0–149)
VLDL Cholesterol Cal: 22 mg/dL (ref 5–40)

## 2024-08-10 LAB — ANEMIA PROFILE B
Basophils Absolute: 0 x10E3/uL (ref 0.0–0.2)
Basos: 0 %
EOS (ABSOLUTE): 0.3 x10E3/uL (ref 0.0–0.4)
Eos: 4 %
Ferritin: 305 ng/mL — AB (ref 15–150)
Folate: 8.2 ng/mL (ref >3.0)
Hematocrit: 34.8 % (ref 34.0–46.6)
Hemoglobin: 11.1 g/dL (ref 11.1–15.9)
Immature Grans (Abs): 0 x10E3/uL (ref 0.0–0.1)
Immature Granulocytes: 0 %
Iron Saturation: 40 % (ref 15–55)
Iron: 84 ug/dL (ref 27–159)
Lymphocytes Absolute: 3.6 x10E3/uL — ABNORMAL HIGH (ref 0.7–3.1)
Lymphs: 56 %
MCH: 26.6 pg (ref 26.6–33.0)
MCHC: 31.9 g/dL (ref 31.5–35.7)
MCV: 83 fL (ref 79–97)
Monocytes Absolute: 0.4 x10E3/uL (ref 0.1–0.9)
Monocytes: 7 %
Neutrophils Absolute: 2.2 x10E3/uL (ref 1.4–7.0)
Neutrophils: 33 %
Platelets: 246 x10E3/uL (ref 150–450)
RBC: 4.18 x10E6/uL (ref 3.77–5.28)
RDW: 13.7 % (ref 11.7–15.4)
Retic Ct Pct: 2.3 % (ref 0.6–2.6)
Total Iron Binding Capacity: 210 ug/dL — AB (ref 250–450)
UIBC: 126 ug/dL — AB (ref 131–425)
Vitamin B-12: 534 pg/mL (ref 232–1245)
WBC: 6.6 x10E3/uL (ref 3.4–10.8)

## 2024-08-10 LAB — CMP14+EGFR
ALT: 11 IU/L (ref 0–32)
AST: 26 IU/L (ref 0–40)
Albumin: 4.2 g/dL (ref 3.9–4.9)
Alkaline Phosphatase: 112 IU/L (ref 41–116)
BUN/Creatinine Ratio: 21 (ref 9–23)
BUN: 16 mg/dL (ref 6–20)
Bilirubin Total: 0.4 mg/dL (ref 0.0–1.2)
CO2: 20 mmol/L (ref 20–29)
Calcium: 10 mg/dL (ref 8.7–10.2)
Chloride: 104 mmol/L (ref 96–106)
Creatinine, Ser: 0.78 mg/dL (ref 0.57–1.00)
Globulin, Total: 2.5 g/dL (ref 1.5–4.5)
Glucose: 63 mg/dL — AB (ref 70–99)
Potassium: 3.6 mmol/L (ref 3.5–5.2)
Sodium: 138 mmol/L (ref 134–144)
Total Protein: 6.7 g/dL (ref 6.0–8.5)
eGFR: 102 mL/min/1.73 (ref >59)

## 2024-08-10 LAB — THYROID PANEL WITH TSH
Free Thyroxine Index: 1.7 (ref 1.2–4.9)
T3 Uptake Ratio: 22 % — AB (ref 24–39)
T4, Total: 7.5 ug/dL (ref 4.5–12.0)
TSH: 2.56 u[IU]/mL (ref 0.450–4.500)

## 2024-08-11 ENCOUNTER — Encounter: Payer: Self-pay | Admitting: Nurse Practitioner

## 2024-08-11 ENCOUNTER — Ambulatory Visit: Payer: Self-pay | Admitting: Nurse Practitioner

## 2024-08-11 ENCOUNTER — Telehealth (INDEPENDENT_AMBULATORY_CARE_PROVIDER_SITE_OTHER): Admitting: Nurse Practitioner

## 2024-08-11 ENCOUNTER — Ambulatory Visit: Payer: Self-pay

## 2024-08-11 DIAGNOSIS — D509 Iron deficiency anemia, unspecified: Secondary | ICD-10-CM | POA: Diagnosis not present

## 2024-08-11 DIAGNOSIS — R197 Diarrhea, unspecified: Secondary | ICD-10-CM | POA: Insufficient documentation

## 2024-08-11 MED ORDER — IRON (FERROUS SULFATE) 325 (65 FE) MG PO TABS: 325.0000 mg | ORAL_TABLET | Freq: Every day | ORAL | 1 refills | Status: AC

## 2024-08-11 NOTE — Progress Notes (Signed)
 Virtual Visit via video Note Due to COVID-19 pandemic this visit was conducted virtually. This visit type was conducted due to national recommendations for restrictions regarding the COVID-19 Pandemic (e.g. social distancing, sheltering in place) in an effort to limit this patient's exposure and mitigate transmission in our community. All issues noted in this document were discussed and addressed.  A physical exam was not performed with this format.   I connected with Gabrielle [name] on 08/11/2024 at 1450 by name and DOB and verified that I am speaking with the correct person using two identifiers. Gabrielle [name] is currently located at Home during visit. The provider, Nena Deitra Morton Sebastian, DNP is located in their office at time of visit.  I discussed the limitations, risks, security and privacy concerns of performing an evaluation and management service by virtual visit and the availability of in person appointments. I also discussed with the patient that there may be a patient responsible charge related to this service. The patient expressed understanding and agreed to proceed.  Subjective:  Patient ID: Gabrielle [name], female    DOB: [birth year], 33 y.o.   MRN: [id number]  Chief Complaint:  Diarrhea (Was started on Duloxetine  and reports Diarrhea after taking the first pill yesterday, and this morning took does not feel like myself)   HPI: Gabrielle [name] is a 33 y.o. female presenting on 08/11/2024 for Diarrhea (Was started on Duloxetine  and reports Diarrhea after taking the first pill yesterday, and this morning took does not feel like myself)  She was seen 08/09/2024 and was prescribed duloxetine  report that she took the first dose of the duloxetine  yesterday on an empty stomach and ended up with diarrhea and today take the second dose no diarrhea nausea or vomiting but feel like not being herself.  Discussed possible side effect of duloxetine  with the client and advised her to take the  medication with food.  She is instructed to either continue the medication or to stop it as drowsiness is one of the side effect of duloxetine  when first initiated.  She will send a MyChart message message either tomorrow or the day after tomorrow with the decision to either continue or stop the medication During this virtual visit discuss lab results from her visit on the 22nd, iron  is low iron  supplement sent to her pharmacy and she is instructed to take it with vitamin C, LDL is low instructed to take fish oil and to increase intake of good fat.  Her blood glucose was low she reports that she had a poor appetite advised her to eat small meals throughout the day as low blood glucose can cause fatigue and drowsiness.   Relevant past medical, surgical, family, and social history reviewed and updated as indicated.  Allergies and medications reviewed and updated.   Past Medical History:  Diagnosis Date   Anxiety    Arthritis    Bilateral Knees   Cancer (HCC) 03/11/2022   Left Breast   Family history of breast cancer 03/21/2022   Family history of prostate cancer 03/21/2022   Hypercholesteremia    Juvenile arthritis (HCC)    Neuropathy    Personal history of chemotherapy    Personal history of radiation therapy     Past Surgical History:  Procedure Laterality Date   AXILLARY SENTINEL NODE BIOPSY Left 05/14/2022   Procedure: LEFT AXILLARY SENTINEL NODE BIOPSY;  Surgeon: Ebbie Cough, MD;  Location: [facility] OR;  Service: General;  Laterality: Left;  GEN w/ PEC  BLOCK   BREAST BIOPSY Left 03/11/2022   BREAST BIOPSY Left 11/25/2023   US  LT BREAST BX W LOC DEV 1ST LESION IMG BX SPEC US  GUIDE 11/25/2023 [facility]   BREAST LUMPECTOMY WITH AXILLARY LYMPH NODE BIOPSY Left 04/16/2022   Procedure: LEFT BREAST LUMPECTOMY WITH LEFT AXILLARY SENTINEL LYMPH NODE BIOPSY;  Surgeon: Ebbie Cough, MD;  Location: [facility];  Service: General;  Laterality: Left;   DENTAL SURGERY      PORTACATH PLACEMENT Right 04/16/2022   Procedure: INSERTION PORT-A-CATH;  Surgeon: Ebbie Cough, MD;  Location:  SURGERY CENTER;  Service: General;  Laterality: Right;   SENTINEL NODE BIOPSY Left 01/01/2024   Procedure: LEFT AXILLARY SENTINEL NODE BIOPSY;  Surgeon: Ebbie Cough, MD;  Location: [facility];  Service: General;  Laterality: Left;   SIMPLE MASTECTOMY WITH AXILLARY SENTINEL NODE BIOPSY Left 01/01/2024   Procedure: LEFT MASTECTOMY;  Surgeon: Ebbie Cough, MD;  Location: [facility] OR;  Service: General;  Laterality: Left;    Social History   Socioeconomic History   Marital status: Single    Spouse name: Not on file   Number of children: 2   Years of education: Not on file   Highest education level: Not on file  Occupational History   Not on file  Tobacco Use   Smoking status: Former    Current packs/day: 0.00    Average packs/day: 0.3 packs/day for 3.0 years (0.8 ttl pk-yrs)    Types: Cigarettes    Start date: 01/17/2019    Quit date: 01/16/2022    Years since quitting: 2.5    Passive exposure: Past   Smokeless tobacco: Never  Vaping Use   Vaping status: Never Used  Substance and Sexual Activity   Alcohol use: Yes    Comment: social   Drug use: Not Currently    Types: Marijuana   Sexual activity: Yes    Birth control/protection: None  Other Topics Concern   Not on file  Social History Narrative   Right handed   Caffeine: not really   Social Drivers of Health   Financial Resource Strain: High Risk (03/20/2022)   Overall Financial Resource Strain (CARDIA)    Difficulty of Paying Living Expenses: Very hard  Food Insecurity: No Food Insecurity (01/01/2024)   Hunger Vital Sign    Worried About Running Out of Food in the Last Year: Never true    Ran Out of Food in the Last Year: Never true  Transportation Needs: No Transportation Needs (01/01/2024)   PRAPARE - Administrator, Civil Service (Medical): No    Lack of Transportation  (Non-Medical): No  Physical Activity: Insufficiently Active (09/23/2018)   Received from [facility]   Exercise Vital Sign    Days of Exercise per Week: 2 days    Minutes of Exercise per Session: 30 min  Stress: Not on file  Social Connections: Unknown (01/01/2024)   Social Connection and Isolation Panel    Frequency of Communication with Friends and Family: More than three times a week    Frequency of Social Gatherings with Friends and Family: More than three times a week    Attends Religious Services: Not on file    Active Member of Clubs or Organizations: Not on file    Attends Banker Meetings: Patient declined    Marital Status: Never married  Intimate Partner Violence: Not At Risk (01/01/2024)   Humiliation, Afraid, Rape, and Kick questionnaire    Fear of Current or  Ex-Partner: No    Emotionally Abused: No    Physically Abused: No    Sexually Abused: No    Outpatient Encounter Medications as of 08/11/2024  Medication Sig   DULoxetine  (CYMBALTA ) 20 MG capsule Take 1 capsule (20 mg total) by mouth daily.   Iron , Ferrous Sulfate , 325 (65 Fe) MG TABS Take 325 mg by mouth daily.   Facility-Administered Encounter Medications as of 08/11/2024  Medication   diphenhydrAMINE  (BENADRYL ) 50 MG/ML injection   famotidine  (PEPCID ) 20-0.9 MG/50ML-% IVPB    Allergies  Allergen Reactions   Oxycodone  Itching    Review of Systems  Constitutional:  Negative for chills and fatigue.  HENT:  Negative for congestion and sore throat.   Gastrointestinal:  Positive for diarrhea. Negative for blood in stool, nausea and vomiting.       1 episode yesterday no diarrhea today  Neurological:  Negative for dizziness and headaches.  Psychiatric/Behavioral:  Negative for sleep disturbance and suicidal ideas. The patient is nervous/anxious.          Observations/Objective: No vital signs or physical exam, this was a virtual health encounter.  Pt alert and oriented, answers all  questions appropriately, and able to speak in full sentences.   Physical Exam HENT:     Head: Normocephalic and atraumatic.  Eyes:     General: No scleral icterus.    Extraocular Movements: Extraocular movements intact.     Conjunctiva/sclera: Conjunctivae normal.     Pupils: Pupils are equal, round, and reactive to light.  Pulmonary:     Effort: Pulmonary effort is normal.  Neurological:     Mental Status: She is alert and oriented to person, place, and time.  Psychiatric:        Attention and Perception: Attention and perception normal.        Mood and Affect: Mood and affect normal.        Speech: Speech is not delayed.        Behavior: Behavior normal. Behavior is cooperative.        Thought Content: Thought content normal.        Cognition and Memory: Cognition and memory normal.        Judgment: Judgment normal.     Assessment and Plan: Gabrielle [name] was seen today for diarrhea.  Diagnoses and all orders for this visit:  Iron  deficiency anemia, unspecified iron  deficiency anemia type  Diarrhea, unspecified type   Gabrielle [name] is a 33 year old African-American female seen today via telehealth for medication question and lab result review, no acute distress Lab result discussed with the client ferrous sulfate  325 sent to his pharmacy instructed to take with vitamin C to increase  absorption - Duloxetine  education provided on side effect of the medication she will make a decision either to continue or stop the medication - Continue all previously prescribed medication  Follow Up Instructions: Return for As previously scheduled.    I discussed the assessment and treatment plan with the patient. The patient was provided an opportunity to ask questions and all were answered. The patient agreed with the plan and demonstrated an understanding of the instructions.   The patient was advised to call back or seek an in-person evaluation if the symptoms worsen or if the condition fails to  improve as anticipated.  The above assessment and management plan was discussed with the patient. The patient verbalized understanding of and has agreed to the management plan. Patient is aware to call the clinic if they develop any new  symptoms or if symptoms persist or worsen. Patient is aware when to return to the clinic for a follow-up visit. Patient educated on when it is appropriate to go to the emergency department.    I provided 12 minutes of time during this video encounter.   Gabrielle Montz [place] Thompson, DNP [facility] [address] [place], [place] [address] [phone] 08/11/2024 Diarrhea, Adult Diarrhea is frequent loose and sometimes watery bowel movements. Diarrhea can make you feel weak and cause you to become dehydrated. Dehydration is a condition in which there is not enough water or other fluids in the body. Dehydration can make you tired and thirsty, cause you to have a dry mouth, and decrease how often you urinate. Diarrhea typically lasts 2-3 days. However, it can last longer if it is a sign of something more serious. It is important to treat your diarrhea as told by your health care provider. Follow these instructions at home: Eating and drinking     Follow these recommendations as told by your health care provider: Take an oral rehydration solution (ORS). This is an over-the-counter medicine that helps return your body to its normal balance of nutrients and water. It is found at pharmacies and retail stores. Drink enough fluid to keep your urine pale yellow. Drink fluids such as water, diluted fruit juice, and low-calorie sports drinks. You can drink milk also, if desired. Sucking on ice chips is another way to get fluids. Avoid drinking fluids that contain a lot of sugar or caffeine, such as soda, energy drinks, and regular sports drinks. Avoid alcohol. Eat bland, easy-to-digest foods in small amounts as you are able. These foods include  bananas, applesauce, rice, lean meats, toast, and crackers. Avoid spicy or fatty foods.   Medicines Take over-the-counter and prescription medicines only as told by your health care provider. If you were prescribed antibiotics, take them as told by your health care provider. Do not stop using the antibiotic even if you start to feel better. General instructions  Wash your hands often using soap and water for at least 20 seconds. If soap and water are not available, use hand sanitizer. Others in the household should wash their hands as well. Hands should be washed: After using the toilet or changing a diaper. Before preparing, cooking, or serving food. While caring for a sick person or while visiting someone in a hospital. Rest at home while you recover. Take a warm bath to relieve any burning or pain from frequent diarrhea episodes. Watch your condition for any changes. Contact a health care provider if: You have a fever. Your diarrhea gets worse. You have new symptoms. You vomit every time you eat or drink. You feel light-headed, dizzy, or have a headache. You have muscle cramps. You have signs of dehydration, such as: Dark urine, very little urine, or no urine. Cracked lips. Dry mouth. Sunken eyes. Sleepiness. Weakness. You have bloody or black stools or stools that look like tar. You have severe pain, cramping, or bloating in your abdomen. Your skin feels cold and clammy. You feel confused. Get help right away if: You have chest pain or your heart is beating very quickly. You have trouble breathing or you are breathing very quickly. You feel extremely weak or you faint. These symptoms may be an emergency. Get help right away. Call 911. Do not wait to see if the symptoms will go away. Do not drive yourself to the hospital. This information is not intended to replace  advice given to you by your health care provider. Make sure you discuss any questions you have with your  health care provider. Document Revised: 04/23/2022 Document Reviewed: 04/23/2022 Elsevier Patient Education  2024 [employer] Anemia  Anemia is a condition in which there are not enough red blood cells or hemoglobin in the blood. Hemoglobin is a substance in red blood cells that carries oxygen. When you do not have enough red blood cells or hemoglobin (are anemic), your body cannot get enough oxygen, and your organs may not work properly. As a result, you may feel very tired or have other problems. What are the causes? Common causes of anemia include: Excessive bleeding. Anemia can be caused by excessive bleeding inside or outside the body, including bleeding from the intestines or from heavy menstrual periods in females. Poor nutrition. Long-lasting (chronic) kidney, thyroid , and liver disease. Bone marrow disorders, spleen problems, and blood disorders. Cancer and treatments for cancer. Human immunodeficiency virus (HIV) and acquired immunodeficiency syndrome (AIDS). Infections, medicines, and autoimmune disorders that destroy red blood cells. What are the signs or symptoms? Symptoms of this condition include: Minor weakness. Dizziness. Headache, or difficulties concentrating and sleeping. Heartbeats that feel irregular or faster than normal (palpitations). Shortness of breath, especially with exercise. Pale skin, lips, and nails, or cold hands and feet. Upset stomach (indigestion) and nausea. Symptoms may occur suddenly or develop slowly. If your anemia is mild, you may not have symptoms. How is this diagnosed? This condition is diagnosed based on blood tests, your medical history, and a physical exam. In some cases, a test may be needed in which cells are removed from the soft tissue inside of a bone and looked at under a microscope (bone marrow biopsy). Your health care provider may also check your stool (feces) for blood and may do more testing to look for the cause of your  bleeding. Other tests may include: Imaging tests, such as a CT scan or MRI. A procedure to see inside your esophagus and stomach (endoscopy). The esophagus is the part of the body that moves food from your mouth to your stomach. A procedure to see inside your colon and rectum (colonoscopy). How is this treated? Treatment for this condition depends on the cause. If you continue to lose a lot of blood, you may need to be treated at a hospital. Treatment may include: Taking supplements of iron , vitamin B12, or folic acid . Taking a hormone medicine (erythropoietin) that can help to stimulate red blood cell growth. Receiving donated blood through an IV (blood transfusion). This may be needed if you lose a lot of blood. Making changes to your diet. Having surgery to remove your spleen. Follow these instructions at home: Take over-the-counter and prescription medicines only as told by your health care provider. Take supplements only as told by your health care provider. Follow any diet instructions that you were given by your health care provider. Keep all follow-up visits. Your health care provider will want to recheck your blood tests. Contact a health care provider if: You develop new bleeding anywhere in the body. You are very weak. Get help right away if: You are short of breath. You have pain in your abdomen or chest. You are dizzy or feel faint. You have trouble concentrating. You have bloody stools, black stools, or tarry stools. You vomit repeatedly or you vomit up blood. These symptoms may be an emergency. Get help right away. Call 911. Do not wait to see if the symptoms will go  away. Do not drive yourself to the hospital. Summary Anemia is a condition in which you do not have enough red blood cells or enough of a substance in your red blood cells that carries oxygen. Symptoms may occur suddenly or develop slowly. If your anemia is mild, you may not have symptoms. This condition  is diagnosed with blood tests, a medical history, and a physical exam. Other tests may be needed. Treatment for this condition depends on the cause of the anemia. This information is not intended to replace advice given to you by your health care provider. Make sure you discuss any questions you have with your health care provider. Document Revised: 01/28/2022 Document Reviewed: 01/28/2022 Elsevier Patient Education  2024 [employer].

## 2024-08-11 NOTE — Telephone Encounter (Signed)
 Noted - patient has appt today 08/11/24

## 2024-08-11 NOTE — Telephone Encounter (Signed)
 FYI Only or Action Required?: FYI only for provider.  Patient was last seen in primary care on 08/09/2024 by Deitra Morton Sebastian Nena, NP.  Called Nurse Triage reporting Fatigue.  Symptoms began yesterday.  Interventions attempted: Rest, hydration, or home remedies.  Symptoms are: unchanged.  Triage Disposition: See HCP Within 4 Hours (Or PCP Triage)  Patient/caregiver understands and will follow disposition?: Yes    Message from [facility] T sent at 08/11/2024  1:51 PM EDT  DULoxetine  (CYMBALTA ) 20 MG capsule- making her feel bad, yesterday without food it made her feel sick and had diarrhea, today with food  she just feels off not feeling herself, does not want to get up and do anything- please call [phone] to advise on what to do   Reason for Disposition  [1] MODERATE weakness (e.g., interferes with work, school, normal activities) AND [2] cause unknown  (Exceptions: Weakness from acute minor illness or poor fluid intake; weakness is chronic and not worse.)  Answer Assessment - Initial Assessment Questions Yesterday first dose on empty stomach had stomach ache with diarrhea, she napped and felt better after waking up. Today she took dose with food, no stomach ache, but feels fatigued and not like herself. No change to leg symptoms.   Additional info: Patient reports she started new medication Cymbalta  on 08/10/24, this gave her physical symptoms of abdominal pain and diarrhea which has resolved, today she took her 2nd ever dose with food and now doesn't feel like herself. She is unable to describe how she is feeling other than weird, not right. She denies, headache, dizziness, tingling, numbness, breathing difficulty, chest pain, fever, and all other symptoms. She states she will no longer take any Cymbalta  and would like replacement. Refused in office follow up, scheduled video visit today with pcp.   1. DESCRIPTION: Describe how you are feeling.     Weird has no  stamina, wants to just lay down 2. SEVERITY: How bad is it?  Can you stand and walk?     Able to get up and move around  3. ONSET: When did these symptoms begin? (e.g., hours, days, weeks, months)     Yesterday  4. CAUSE: What do you think is causing the weakness or fatigue? (e.g., not drinking enough fluids, medical problem, trouble sleeping)     Cymbalta   5. NEW MEDICINES:  Have you started on any new medicines recently? (e.g., opioid pain medicines, benzodiazepines, muscle relaxants, antidepressants, antihistamines, neuroleptics, beta blockers)     Cymbalta   6. OTHER SYMPTOMS: Do you have any other symptoms? (e.g., chest pain, fever, cough, SOB, vomiting, diarrhea, bleeding, other areas of pain)     Diarrhea and abdominal pain 08/10/24 has resolved 7. PREGNANCY: Is there any chance you are pregnant? When was your last menstrual period?  Protocols used: Weakness (Generalized) and Fatigue-A-AH

## 2024-08-31 ENCOUNTER — Encounter: Payer: Self-pay | Admitting: Hematology

## 2024-08-31 NOTE — Progress Notes (Signed)
 This encounter was created in error - please disregard.

## 2024-09-08 ENCOUNTER — Other Ambulatory Visit: Payer: Self-pay | Admitting: Medical Genetics

## 2024-09-08 ENCOUNTER — Inpatient Hospital Stay: Admitting: Adult Health

## 2024-09-08 ENCOUNTER — Telehealth: Payer: Self-pay | Admitting: Adult Health

## 2024-09-08 DIAGNOSIS — Z006 Encounter for examination for normal comparison and control in clinical research program: Secondary | ICD-10-CM

## 2024-09-08 NOTE — Telephone Encounter (Signed)
 Pt called in to reschedule her appt  today. Pt is aware of her appt.

## 2024-09-10 ENCOUNTER — Ambulatory Visit: Payer: Self-pay

## 2024-09-10 NOTE — Telephone Encounter (Signed)
 Noted

## 2024-09-10 NOTE — Telephone Encounter (Signed)
 Offered patient an appointment this afternoon at her PCP office at 2:30pm but patient states she cannot do that because of waiting to get her children This RN advised her with heavy bleeding and a history of anemia in the past & with this being completely abnormal and new for her it is recommended that she be evaluated today. No other openings in PCP office or within the surrounding region. Patient states she will go to Urgent Care today.   FYI Only or Action Required?: FYI only for provider.  Patient was last seen in primary care on 08/11/2024 by Gabrielle Morton Sebastian Nena, NP.  Called Nurse Triage reporting Menstrual Problem.  Symptoms began today.  Interventions attempted: Nothing.  Symptoms are: gradually worsening.  Triage Disposition: See HCP Within 4 Hours (Or PCP Triage)  Patient/caregiver understands and will follow disposition?: Yes--Patient states that she will go to Urgent Care today            Copied from CRM #[id number]. Topic: Clinical - Red Word Triage >> Sep 10, 2024 12:29 PM Ivette P wrote: Red Word that prompted transfer to Nurse Triage: Period is heavy than usual. Blood dripping down legs.    Last month had period 2 times in a month. Reason for Disposition  MODERATE vaginal bleeding (e.g., soaking 1 pad or tampon per hour and present > 6 hours; 1 menstrual cup every 6 hours)  Answer Assessment - Initial Assessment Questions Patient states that the only change is that on 08/09/2024 patient started taking Cymbalta  and she started taking Iron  pills on 08/11/2024  Usually three days and usually always at the beginning of the month This morning--woke up and experiencing very heavy bleeding  Last month patient had a period twice Patient inquired about possibly getting on birth control  Breast Cancer two years ago--patient had to have hormone injections and those stopped around the beginning of 2024.  Offered patient an appointment this afternoon at her PCP  office at 2:30pm but patient states she cannot do that because of waiting to get her children This RN advised her with heavy bleeding and a history of anemia in the past & with this being completely abnormal and new for her it is recommended that she be evaluated today. No other openings in PCP office or within the surrounding region. Patient states she will go to Urgent Care today. Patient is advised to call us  back if anything changes or with any further questions/concerns. Patient is advised that if anything worsens to go to the Emergency Room. Patient verbalized understanding.   1. BLEEDING SEVERITY: Describe the bleeding that you are having. How much bleeding is there?      Usually three days and usually always at the beginning of the month This morning--woke up and experiencing very heavy bleeding  Last month patient had a period twice 5. ABDOMEN PAIN: Do you have any pain? How bad is the pain?  (e.g., Scale 0-10; none, mild, moderate, or severe)     Cramping --- patient states they never really hurt like that 6. PREGNANCY: Is there any chance you are pregnant? When was your last menstrual period?     I hope not----I dont think so. Doctor told me I couldn't have kids after that hormone medicine from when I had breast cancer 7. BREASTFEEDING: Are you breastfeeding?     No 8. HORMONE MEDICINES: Are you taking any hormone medicines, prescription or over-the-counter? (e.g., birth control pills, estrogen)     Breast Cancer two years ago--patient  had to have hormone injections and those stopped around the beginning of 2024. 9. BLOOD THINNER MEDICINES: Do you take any blood thinners? (e.g., Coumadin / warfarin, Pradaxa / dabigatran, aspirin)     Denies 10. CAUSE: What do you think is causing the bleeding? (e.g., recent gyn surgery, recent gyn procedure; known bleeding disorder, cervical cancer, polycystic ovarian disease, fibroids)         unknown 11. HEMODYNAMIC STATUS:  Are you weak or feeling lightheaded? If Yes, ask: Can you stand and walk normally?        Patient denies 29. OTHER SYMPTOMS: What other symptoms are you having with the bleeding? (e.g., passed tissue, vaginal discharge, fever, menstrual-type cramps)       cramps  Protocols used: Vaginal Bleeding - Abnormal-A-AH

## 2024-09-15 ENCOUNTER — Inpatient Hospital Stay: Admitting: Adult Health

## 2024-09-23 ENCOUNTER — Inpatient Hospital Stay: Admitting: Adult Health

## 2024-09-23 NOTE — Progress Notes (Deleted)
 SURVIVORSHIP VISIT:  BRIEF ONCOLOGIC HISTORY:  Oncology History Overview Note   Cancer Staging  Malignant neoplasm of upper-outer quadrant of left breast in female, estrogen receptor negative (HCC) Staging form: Breast, AJCC 8th Edition - Clinical stage from 03/11/2022: Stage IIB (cT2, cN0, cM0, G3, ER-, PR-, HER2-) - Signed by Lanny Callander, MD on 03/19/2022    Recurrent breast cancer, left (HCC)  03/08/2022 Mammogram   CLINICAL DATA:  33 year old female presenting for evaluation of a palpable lump in the left breast which she feels has increased in size since she first identified it. Her mother was diagnosed with breast cancer within the last 6 months. She also has family history of breast cancer in multiple aunts and her maternal grandmother.   EXAM: DIGITAL DIAGNOSTIC BILATERAL MAMMOGRAM WITH TOMOSYNTHESIS AND CAD; ULTRASOUND LEFT BREAST LIMITED  IMPRESSION: 1. There is a suspicious 3.7 cm mass in the left breast at 12 o'clock.   2.  No evidence of left axillary lymphadenopathy.   3.  No evidence of malignancy in the right breast.   03/11/2022 Cancer Staging   Staging form: Breast, AJCC 8th Edition - Clinical stage from 03/11/2022: Stage IIB (cT2, cN0, cM0, G3, ER-, PR-, HER2-) - Signed by Lanny Callander, MD on 03/19/2022 Stage prefix: Initial diagnosis Histologic grading system: 3 grade system   03/11/2022 Initial Biopsy   Diagnosis Breast, left, needle core biopsy, 12:00 - METAPLASTIC CARCINOMA - SEE COMMENT  Microscopic Comment The biopsy has an invasive epithelial component admixed with chondroid deposition, consistent with a metaplastic carcinoma. Based on the biopsy, the carcinoma appears Nottingham grade 3 of 3 and measures 1.2 cm in greatest linear extent.  PROGNOSTIC INDICATORS Results: The tumor cells are NEGATIVE for Her2 (0). Estrogen Receptor: 0%, NEGATIVE Progesterone Receptor: 0%, NEGATIVE Proliferation Marker Ki67: 40%   03/28/2022 Genetic Testing   Negative  hereditary cancer genetic testing: no pathogenic variants detected in [facility] BRCAPlus Panel or [employer] CustomNext-Cancer +RNAinsight Panel.  Report dates are 03/28/2022 and 03/31/2022. SABRA   The BRCAplus panel offered by [employer] and includes sequencing and deletion/duplication analysis for the following 8 genes: ATM, BRCA1, BRCA2, CDH1, CHEK2, PALB2, PTEN, and TP53.  The CustomNext-Cancer+RNAinsight panel offered by [employer] includes sequencing and rearrangement analysis for the following 47 genes:  APC, ATM, AXIN2, BARD1, BMPR1A, BRCA1, BRCA2, BRIP1, CDH1, CDK4, CDKN2A, CHEK2, DICER1, EPCAM, GREM1, HOXB13, MEN1, MLH1, MSH2, MSH3, MSH6, MUTYH, NBN, NF1, NF2, NTHL1, PALB2, PMS2, POLD1, POLE, PTEN, RAD51C, RAD51D, RECQL, RET, SDHA, SDHAF2, SDHB, SDHC, SDHD, SMAD4, SMARCA4, STK11, TP53, TSC1, TSC2, and VHL.  RNA data is routinely analyzed for use in variant interpretation for all genes.   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   05/14/2022 Cancer Staging   Staging form: Breast, AJCC 8th Edition - Pathologic stage from 05/14/2022: Stage IIA (pT2, pN0, cM0, G3, ER-, PR-, HER2-) - Signed by Lanny Callander, MD on 05/28/2022 Stage prefix: Initial diagnosis Histologic grading system: 3 grade system Residual tumor (R): R0 - None   06/14/2022 - 07/05/2022 Chemotherapy   Patient is on Treatment Plan : BREAST Pembrolizumab  (200) D1 + Carboplatin  (5) D1 + Paclitaxel  (80) D1,8,15 q21d X 4 cycles / Pembrolizumab  (200) D1 + AC D1 q21d x 4 cycles     06/14/2022 - 09/19/2022 Adjuvant Chemotherapy   Taxol , carboplatin , Keytruda  to be followed by Adriamycin , Cytoxan , Keytruda .  She was  able to tolerate 9 cycles of weekly Taxol ; 4 cycles of every 3-week carboplatin  and Keytruda , and 1 cycle of Adriamycin  Cytoxan  and Keytruda .    01/16/2023 - 03/06/2023 Radiation Therapy   Plan Name: Breast_L_BH Site: Breast, Left Technique: 3D Mode: Photon Dose Per Fraction: 1.8 Gy Prescribed Dose (Delivered / Prescribed): 27 Gy / 27 Gy Prescribed Fxs (Delivered / Prescribed): 15 / 15   Plan Name: Breast_L:1 Site: Breast, Left Technique: 3D Mode: Photon Dose Per Fraction: 1.8 Gy Prescribed Dose (Delivered / Prescribed): 23.4 Gy / 23.4 Gy Prescribed Fxs (Delivered / Prescribed): 13 / 13   Plan Name: Breast_L_Bst Site: Breast, Left Technique: 3D Mode: Photon Dose Per Fraction: 2 Gy Prescribed Dose (Delivered / Prescribed): 10 Gy / 10 Gy Prescribed Fxs (Delivered / Prescribed): 5 / 5     11/25/2023 Relapse/Recurrence   Left breast needle core biopsy at 12:30 o'clock: metaplastic carcinoma, triple negative, 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     12/18/2023 Imaging   CT chest, abdomen, and pelvis with contrast  IMPRESSION: 1. Skin thickening of the left breast with multiple calcifications and asymmetric soft tissue, compatible with known left breast  malignancy. 2. No convincing evidence of metastatic disease in the chest, abdomen or pelvis. 3. Crescentic soft tissue in the anterior mediastinum which conforms to underlying vasculature, favored to reflect thymic tissue suggest attention on follow-up imaging. 4. Cholelithiasis without findings of acute cholecystitis.   01/01/2024 Surgery   Left mastectomy - Dr. Ebbie: Metaplastic carcinoma with extensive necrosis 6.2 x 4.5 x 3.8 cm, ER negative, PR negative, Ki67 25%, HER2 negative (0+).  5 lymph nodes negative for metastasis. Stage IIIA triple negative breast cancer.      INTERVAL HISTORY:  Discussed the use of AI scribe software for clinical note transcription with the patient, who gave verbal consent to proceed.  History of Present Illness     REVIEW OF SYSTEMS:  Review of Systems  Constitutional:  Negative for appetite change, chills, fatigue, fever and unexpected weight change.   HENT:   Negative for hearing loss, lump/mass and trouble swallowing.   Eyes:  Negative for eye problems and icterus.  Respiratory:  Negative for chest tightness, cough and shortness of breath.   Cardiovascular:  Negative for chest pain, leg swelling and palpitations.  Gastrointestinal:  Negative for abdominal distention, abdominal pain, constipation, diarrhea, nausea and vomiting.  Endocrine: Negative for hot flashes.  Genitourinary:  Negative for difficulty urinating.   Musculoskeletal:  Negative for arthralgias.  Skin:  Negative for itching and rash.  Neurological:  Negative for dizziness, extremity weakness, headaches and numbness.  Hematological:  Negative for adenopathy. Does not bruise/bleed easily.  Psychiatric/Behavioral:  Negative for depression. The patient is not nervous/anxious.    Breast: Denies any new nodularity, masses, tenderness, nipple changes, or nipple discharge.       PAST MEDICAL/SURGICAL HISTORY:  Past Medical History:  Diagnosis Date   Anxiety    Arthritis    Bilateral Knees   Cancer (HCC) 03/11/2022   Left Breast   Family history of breast cancer 03/21/2022   Family history of prostate cancer 03/21/2022   Hypercholesteremia    Juvenile arthritis (HCC)    Neuropathy    Personal history of chemotherapy    Personal history of radiation therapy    Past Surgical History:  Procedure Laterality Date   AXILLARY SENTINEL NODE BIOPSY Left 05/14/2022   Procedure: LEFT AXILLARY SENTINEL NODE BIOPSY;  Surgeon: Ebbie Cough, MD;  Location: [facility] OR;  Service: General;  Laterality: Left;  GEN w/ PEC BLOCK   BREAST BIOPSY Left 03/11/2022   BREAST BIOPSY Left 11/25/2023   US  LT BREAST BX W LOC DEV 1ST LESION IMG BX SPEC US  GUIDE 11/25/2023 [facility] MAMMOGRAPHY   BREAST LUMPECTOMY WITH AXILLARY LYMPH NODE BIOPSY Left 04/16/2022   Procedure: LEFT BREAST LUMPECTOMY WITH LEFT AXILLARY SENTINEL LYMPH NODE BIOPSY;  Surgeon: Ebbie Cough, MD;  Location: [facility];  Service: General;  Laterality: Left;   DENTAL SURGERY     PORTACATH PLACEMENT Right 04/16/2022   Procedure: INSERTION PORT-A-CATH;  Surgeon: Ebbie Cough, MD;  Location: [facility];  Service: General;  Laterality: Right;   SENTINEL NODE BIOPSY Left 01/01/2024   Procedure: LEFT AXILLARY SENTINEL NODE BIOPSY;  Surgeon: Ebbie Cough, MD;  Location: [facility] OR;  Service: General;  Laterality: Left;   SIMPLE MASTECTOMY WITH AXILLARY SENTINEL NODE BIOPSY Left 01/01/2024   Procedure: LEFT MASTECTOMY;  Surgeon: Ebbie Cough, MD;  Location: [facility];  Service: General;  Laterality: Left;     ALLERGIES:  Allergies  Allergen Reactions   Oxycodone  Itching     CURRENT MEDICATIONS:  Outpatient Encounter Medications as of 09/23/2024  Medication Sig   DULoxetine  (CYMBALTA ) 20 MG capsule Take 1 capsule (20 mg total) by mouth daily.   Iron , Ferrous Sulfate , 325 (65 Fe) MG TABS Take 325 mg by mouth daily.   Facility-Administered Encounter Medications as of 09/23/2024  Medication   diphenhydrAMINE  (BENADRYL ) 50 MG/ML injection   famotidine  (PEPCID ) 20-0.9 MG/50ML-% IVPB     ONCOLOGIC FAMILY HISTORY:  Family History  Problem Relation Age of Onset   Breast cancer Mother 31   Other Mother        sleeping problems   Prostate cancer Father 67   Diabetes Brother    Breast cancer Paternal Grandmother        dx after 76   Prostate cancer Paternal Grandfather        dx 77s; metastatic   ADD / ADHD Son    Breast cancer Maternal Aunt 61   Breast cancer Paternal Aunt        dx unknown age   Prostate cancer Paternal Uncle        dx after 86   Cystic fibrosis Niece    Sleep apnea Neg Hx      SOCIAL HISTORY:  Social History   Socioeconomic History   Marital status: Single    Spouse name: Not on file   Number of children: 2   Years of education: Not on file   Highest education level: Not on file  Occupational History   Not on file  Tobacco Use    Smoking status: Former    Current packs/day: 0.00    Average packs/day: 0.3 packs/day for 3.0 years (0.8 ttl pk-yrs)    Types: Cigarettes    Start date: 01/17/2019    Quit date: 01/16/2022    Years since quitting: 2.6    Passive exposure: Past   Smokeless tobacco: Never  Vaping Use   Vaping status: Never Used  Substance and Sexual Activity   Alcohol use: Yes    Comment: social   Drug use: Not Currently    Types: Marijuana   Sexual activity: Yes    Birth control/protection: None  Other Topics Concern   Not on file  Social History Narrative   Right handed   Caffeine: not really   Social Drivers of Health   Financial Resource Strain: High Risk (03/20/2022)   Overall Financial  Resource Strain (CARDIA)    Difficulty of Paying Living Expenses: Very hard  Food Insecurity: No Food Insecurity (01/01/2024)   Hunger Vital Sign    Worried About Running Out of Food in the Last Year: Never true    Ran Out of Food in the Last Year: Never true  Transportation Needs: No Transportation Needs (01/01/2024)   PRAPARE - Administrator, Civil Service (Medical): No    Lack of Transportation (Non-Medical): No  Physical Activity: Insufficiently Active (09/23/2018)   Received from [facility]   Exercise Vital Sign    Days of Exercise per Week: 2 days    Minutes of Exercise per Session: 30 min  Stress: Not on file  Social Connections: Unknown (01/01/2024)   Social Connection and Isolation Panel    Frequency of Communication with Friends and Family: More than three times a week    Frequency of Social Gatherings with Friends and Family: More than three times a week    Attends Religious Services: Not on file    Active Member of Clubs or Organizations: Not on file    Attends Banker Meetings: Patient declined    Marital Status: Never married  Intimate Partner Violence: Not At Risk (01/01/2024)   Humiliation, Afraid, Rape, and Kick questionnaire    Fear of Current or  Ex-Partner: No    Emotionally Abused: No    Physically Abused: No    Sexually Abused: No     OBSERVATIONS/OBJECTIVE:  There were no vitals taken for this visit. GENERAL: Patient is a well appearing female in no acute distress HEENT:  Sclerae anicteric.  Oropharynx clear and moist. No ulcerations or evidence of oropharyngeal candidiasis. Neck is supple.  NODES:  No cervical, supraclavicular, or axillary lymphadenopathy palpated.  BREAST EXAM:  Deferred. LUNGS:  Clear to auscultation bilaterally.  No wheezes or rhonchi. HEART:  Regular rate and rhythm. No murmur appreciated. ABDOMEN:  Soft, nontender.  Positive, normoactive bowel sounds. No organomegaly palpated. MSK:  No focal spinal tenderness to palpation. Full range of motion bilaterally in the upper extremities. EXTREMITIES:  No peripheral edema.   SKIN:  Clear with no obvious rashes or skin changes. No nail dyscrasia. NEURO:  Nonfocal. Well oriented.  Appropriate affect.   LABORATORY DATA:  None for this visit.  DIAGNOSTIC IMAGING:  None for this visit.      ASSESSMENT AND PLAN:  Ms.. Sultan is a pleasant 33 y.o. female with Stage *** right/left breast invasive ductal carcinoma, ER+/PR+/HER2-, diagnosed in ***, treated with lumpectomy, adjuvant radiation therapy, and anti-estrogen therapy with *** beginning in ***.  She presents to the Survivorship Clinic for our initial meeting and routine follow-up post-completion of treatment for breast cancer.    1. Stage *** right/left breast cancer:  Ms. Dolson is continuing to recover from definitive treatment for breast cancer. She will follow-up with her medical oncologist, Dr.  PIERRETTE with history and physical exam per surveillance protocol.  She will continue her anti-estrogen therapy with ***. Thus far, she is tolerating the *** well, with minimal side effects. Her mammogram is due ***; orders placed today.   Today, a comprehensive survivorship care plan and treatment summary was reviewed  with the patient today detailing her breast cancer diagnosis, treatment course, potential late/long-term effects of treatment, appropriate follow-up care with recommendations for the future, and patient education resources.  A copy of this summary, along with a letter will be sent to the patient's primary care provider via mail/fax/In Basket  message after today's visit.    #. Problem(s) at Visit______________  #. Bone health:  Given Ms. Branton's age/history of breast cancer and her current treatment regimen including anti-estrogen therapy with ***, she is at risk for bone demineralization.  Her last DEXA scan was ***, which showed ***.  In the meantime, she was encouraged to increase her consumption of foods rich in calcium , as well as increase her weight-bearing activities.  She was given education on specific activities to promote bone health.  #. Cancer screening:  Due to Ms. Bassett's history and her age, she should receive screening for skin cancers, colon cancer, and gynecologic cancers.  The information and recommendations are listed on the patient's comprehensive care plan/treatment summary and were reviewed in detail with the patient.    #. Health maintenance and wellness promotion: Ms. Minarik was encouraged to consume 5-7 servings of fruits and vegetables per day. We reviewed the Nutrition Rainbow handout.  She was also encouraged to engage in moderate to vigorous exercise for 30 minutes per day most days of the week.  She was instructed to limit her alcohol consumption and continue to abstain from tobacco use/***was encouraged stop smoking.     #. Support services/counseling: It is not uncommon for this period of the patient's cancer care trajectory to be one of many emotions and stressors.   She was given information regarding our available services and encouraged to contact me with any questions or for help enrolling in any of our support group/programs.    Follow up instructions:     -Return to cancer center ***  -Mammogram due in *** -She is welcome to return back to the Survivorship Clinic at any time; no additional follow-up needed at this time.  -Consider referral back to survivorship as a long-term survivor for continued surveillance  The patient was provided an opportunity to ask questions and all were answered. The patient agreed with the plan and demonstrated an understanding of the instructions.   Total encounter time:*** minutes*in face-to-face visit time, chart review, lab review, care coordination, order entry, and documentation of the encounter time.    Morna Kendall, NP 09/23/24 12:56 PM Medical Oncology and Hematology [facility] [address] [place], [place] [address] Tel. [phone]    Fax. [phone]  *Total Encounter Time as defined by the Centers for Medicare and Medicaid Services includes, in addition to the face-to-face time of a patient visit (documented in the note above) non-face-to-face time: obtaining and reviewing outside history, ordering and reviewing medications, tests or procedures, care coordination (communications with other health care professionals or caregivers) and documentation in the medical record.

## 2024-10-08 LAB — GENECONNECT MOLECULAR SCREEN: Genetic Analysis Overall Interpretation: NEGATIVE

## 2024-10-11 ENCOUNTER — Ambulatory Visit: Admitting: Nurse Practitioner

## 2024-10-11 ENCOUNTER — Ambulatory Visit: Payer: Self-pay | Admitting: Nurse Practitioner

## 2024-10-11 ENCOUNTER — Other Ambulatory Visit: Payer: Self-pay

## 2024-10-11 ENCOUNTER — Other Ambulatory Visit

## 2024-10-11 VITALS — BP 100/67 | HR 84 | Temp 97.4°F | Ht 69.0 in | Wt 142.2 lb

## 2024-10-11 DIAGNOSIS — R8290 Unspecified abnormal findings in urine: Secondary | ICD-10-CM | POA: Diagnosis not present

## 2024-10-11 DIAGNOSIS — A5901 Trichomonal vulvovaginitis: Secondary | ICD-10-CM

## 2024-10-11 DIAGNOSIS — N3001 Acute cystitis with hematuria: Secondary | ICD-10-CM

## 2024-10-11 DIAGNOSIS — M545 Low back pain, unspecified: Secondary | ICD-10-CM

## 2024-10-11 DIAGNOSIS — F419 Anxiety disorder, unspecified: Secondary | ICD-10-CM | POA: Diagnosis not present

## 2024-10-11 LAB — MICROSCOPIC EXAMINATION
RBC, Urine: NONE SEEN /HPF (ref 0–2)
Renal Epithel, UA: NONE SEEN /HPF
Yeast, UA: NONE SEEN

## 2024-10-11 LAB — URINALYSIS, ROUTINE W REFLEX MICROSCOPIC
Glucose, UA: NEGATIVE
Nitrite, UA: NEGATIVE
RBC, UA: NEGATIVE
Specific Gravity, UA: 1.025 (ref 1.005–1.030)
Urobilinogen, Ur: 4 mg/dL — ABNORMAL HIGH (ref 0.2–1.0)
pH, UA: 5.5 (ref 5.0–7.5)

## 2024-10-11 MED ORDER — METRONIDAZOLE 500 MG PO TABS: 500.0000 mg | ORAL_TABLET | Freq: Two times a day (BID) | ORAL | 0 refills | Status: AC

## 2024-10-11 MED ORDER — SULFAMETHOXAZOLE-TRIMETHOPRIM 800-160 MG PO TABS: 1.0000 | ORAL_TABLET | Freq: Two times a day (BID) | ORAL | 0 refills | Status: AC

## 2024-10-11 MED ORDER — HYDROXYZINE HCL 10 MG PO TABS: 10.0000 mg | ORAL_TABLET | Freq: Three times a day (TID) | ORAL | 0 refills | Status: AC | PRN

## 2024-10-11 MED ORDER — FLUCONAZOLE 150 MG PO TABS: 150.0000 mg | ORAL_TABLET | Freq: Once | ORAL | 0 refills | Status: AC

## 2024-10-11 NOTE — Progress Notes (Signed)
 Subjective:  Patient ID: Gabrielle [name], female    DOB: [birth year], 33 y.o.   MRN: [id number]  Patient Care Team: Deitra Morton Hummer, Nena, NP as PCP - General (Nurse Practitioner) Tyree Nanetta SAILOR, RN as Oncology Nurse Navigator Ebbie Cough, MD as Consulting Physician (General Surgery) Lanny Callander, MD as Consulting Physician (Hematology) Dewey [name], MD as Consulting Physician (Radiation Oncology) Heide Ingle, MD as Consulting Physician (Orthopedic Surgery) Neomi Johnston ONEIDA DEVONNA as Physician Assistant (Hematology and Oncology) Imaging, [facility] as Radiologist (Diagnostic Radiology) Arelia Filippo, MD as Consulting Physician (Plastic Surgery)   Chief Complaint:  Back Pain (Low back pain started yesterday )   HPI: Gabrielle [name] is a 33 y.o. female presenting on 10/11/2024 for Back Pain (Low back pain started yesterday )   Discussed the use of AI scribe software for clinical note transcription with the patient, who gave verbal consent to proceed.  History of Present Illness Gabrielle [name] is a 33 year old female who presents with acute lower back pain.  She has been experiencing acute lower back pain since yesterday, localized to the lower back without radiation to the legs. No increased urination or pressure when urinating, but she notes a change in urine color to dark yellow. She has been attempting to stay hydrated.  She is sexually active.  She has a history of iron  deficiency anemia and has stopped taking her iron  supplements  I didn't think I needed it. Her recent menstrual period lasted four days, during which she felt weak and tired, attributing this to blood loss.  She experiences frequent panic attacks, approximately 6 times a month, characterized by feeling hot, unable to breathe, and as if the room is closing in. These occur in various situations, including when outside, in the car, or around people. Her anxiety is heightened during  medical appointments, particularly with her oncologist or when undergoing an MRI.  She reports unintentional weight loss despite having a good appetite and eating regular meals. She eats breakfast, lunch, and dinner, along with snacks throughout the day. She is concerned about her weight loss as she perceives herself to be gaining weight in certain areas.  She has a port located on the right side of her chest, placed in 2023, and reports difficulty with access, leading to multiple unsuccessful attempts to flush it.       10/11/2024   10:22 AM 08/09/2024    3:25 PM  PHQ9 SCORE ONLY  PHQ-9 Total Score 3 4       10/11/2024   10:22 AM 08/09/2024    3:29 PM 08/09/2024    3:26 PM  GAD 7 : Generalized Anxiety Score  Nervous, Anxious, on Edge 2 3 2   Control/stop worrying 0 0 0  Worry too much - different things 0 0 0  Trouble relaxing 0 3 0  Restless 0 0 0  Easily annoyed or irritable 0 0 0  Afraid - awful might happen 0 0 0  Total GAD 7 Score 2 6 2   Anxiety Difficulty Not difficult at all Somewhat difficult Not difficult at all      Relevant past medical, surgical, family, and social history reviewed and updated as indicated.  Allergies and medications reviewed and updated. Data reviewed: Chart in Epic.   Past Medical History:  Diagnosis Date   Anxiety    Arthritis    Bilateral Knees   Cancer (HCC) 03/11/2022   Left Breast   Family history  of breast cancer 03/21/2022   Family history of prostate cancer 03/21/2022   Hypercholesteremia    Juvenile arthritis (HCC)    Neuropathy    Personal history of chemotherapy    Personal history of radiation therapy     Past Surgical History:  Procedure Laterality Date   AXILLARY SENTINEL NODE BIOPSY Left 05/14/2022   Procedure: LEFT AXILLARY SENTINEL NODE BIOPSY;  Surgeon: Ebbie Cough, MD;  Location: [facility] OR;  Service: General;  Laterality: Left;  GEN w/ PEC BLOCK   BREAST BIOPSY Left 03/11/2022   BREAST BIOPSY Left 11/25/2023   US   LT BREAST BX W LOC DEV 1ST LESION IMG BX SPEC US  GUIDE 11/25/2023 [facility]   BREAST LUMPECTOMY WITH AXILLARY LYMPH NODE BIOPSY Left 04/16/2022   Procedure: LEFT BREAST LUMPECTOMY WITH LEFT AXILLARY SENTINEL LYMPH NODE BIOPSY;  Surgeon: Ebbie Cough, MD;  Location: [facility];  Service: General;  Laterality: Left;   DENTAL SURGERY     PORTACATH PLACEMENT Right 04/16/2022   Procedure: INSERTION PORT-A-CATH;  Surgeon: Ebbie Cough, MD;  Location: [facility];  Service: General;  Laterality: Right;   SENTINEL NODE BIOPSY Left 01/01/2024   Procedure: LEFT AXILLARY SENTINEL NODE BIOPSY;  Surgeon: Ebbie Cough, MD;  Location: [facility] OR;  Service: General;  Laterality: Left;   SIMPLE MASTECTOMY WITH AXILLARY SENTINEL NODE BIOPSY Left 01/01/2024   Procedure: LEFT MASTECTOMY;  Surgeon: Ebbie Cough, MD;  Location: [facility] OR;  Service: General;  Laterality: Left;    Social History   Socioeconomic History   Marital status: Single    Spouse name: Not on file   Number of children: 2   Years of education: Not on file   Highest education level: Some college, no degree  Occupational History   Not on file  Tobacco Use   Smoking status: Former    Current packs/day: 0.00    Average packs/day: 0.3 packs/day for 3.0 years (0.8 ttl pk-yrs)    Types: Cigarettes    Start date: 01/17/2019    Quit date: 01/16/2022    Years since quitting: 2.7    Passive exposure: Past   Smokeless tobacco: Never  Vaping Use   Vaping status: Never Used  Substance and Sexual Activity   Alcohol use: Yes    Comment: social   Drug use: Not Currently    Types: Marijuana   Sexual activity: Yes    Birth control/protection: None  Other Topics Concern   Not on file  Social History Narrative   Right handed   Caffeine: not really   Social Drivers of Health   Financial Resource Strain: Medium Risk (10/11/2024)   Overall Financial Resource Strain (CARDIA)    Difficulty of  Paying Living Expenses: Somewhat hard  Food Insecurity: Patient Declined (10/11/2024)   Hunger Vital Sign    Worried About Running Out of Food in the Last Year: Patient declined    Ran Out of Food in the Last Year: Patient declined  Transportation Needs: Unmet Transportation Needs (10/11/2024)   PRAPARE - Transportation    Lack of Transportation (Medical): Yes    Lack of Transportation (Non-Medical): Yes  Physical Activity: Sufficiently Active (10/11/2024)   Exercise Vital Sign    Days of Exercise per Week: 7 days    Minutes of Exercise per Session: 30 min  Stress: Stress Concern Present (10/11/2024)   [employer] of Occupational Health - Occupational Stress Questionnaire    Feeling of Stress: To some extent  Social Connections: Moderately Isolated (10/11/2024)  Social Advertising Account Executive    Frequency of Communication with Friends and Family: Once a week    Frequency of Social Gatherings with Friends and Family: Once a week    Attends Religious Services: More than 4 times per year    Active Member of [employer] or Organizations: Yes    Attends Engineer, Structural: More than 4 times per year    Marital Status: Never married  Intimate Partner Violence: Not At Risk (01/01/2024)   Humiliation, Afraid, Rape, and Kick questionnaire    Fear of Current or Ex-Partner: No    Emotionally Abused: No    Physically Abused: No    Sexually Abused: No    Outpatient Encounter Medications as of 10/11/2024  Medication Sig   fluconazole  (DIFLUCAN ) 150 MG tablet Take 1 tablet (150 mg total) by mouth once for 1 dose.   hydrOXYzine  (ATARAX ) 10 MG tablet Take 1 tablet (10 mg total) by mouth 3 (three) times daily as needed.   metroNIDAZOLE  (FLAGYL ) 500 MG tablet Take 1 tablet (500 mg total) by mouth 2 (two) times daily for 7 days.   sulfamethoxazole -trimethoprim  (BACTRIM  DS) 800-160 MG tablet Take 1 tablet by mouth 2 (two) times daily for 7 days.   Iron , Ferrous Sulfate , 325 (65 Fe)  MG TABS Take 325 mg by mouth daily. (Patient not taking: Reported on 10/11/2024)   [DISCONTINUED] DULoxetine  (CYMBALTA ) 20 MG capsule Take 1 capsule (20 mg total) by mouth daily.   Facility-Administered Encounter Medications as of 10/11/2024  Medication   diphenhydrAMINE  (BENADRYL ) 50 MG/ML injection   famotidine  (PEPCID ) 20-0.9 MG/50ML-% IVPB    Allergies  Allergen Reactions   Oxycodone  Itching    Pertinent ROS per HPI, otherwise unremarkable      Objective:  BP 100/67   Pulse 84   Temp (!) 97.4 F (36.3 C) (Temporal)   Ht 5' 9 (1.753 m)   Wt 142 lb 3.2 oz (64.5 kg)   SpO2 98%   BMI 21.00 kg/m    Wt Readings from Last 3 Encounters:  10/11/24 142 lb 3.2 oz (64.5 kg)  08/09/24 147 lb 12.8 oz (67 kg)  06/16/24 143 lb 3.2 oz (65 kg)   BP Readings from Last 3 Encounters:  10/11/24 100/67  08/09/24 93/61  06/16/24 111/61     Physical Exam Vitals and nursing note reviewed.  Constitutional:      General: She is not in acute distress.    Appearance: Normal appearance.  HENT:     Head: Normocephalic and atraumatic.     Nose: Nose normal.     Mouth/Throat:     Mouth: Mucous membranes are moist.  Eyes:     Extraocular Movements: Extraocular movements intact.     Conjunctiva/sclera: Conjunctivae normal.     Pupils: Pupils are equal, round, and reactive to light.  Cardiovascular:     Heart sounds: Normal heart sounds.  Pulmonary:     Effort: Pulmonary effort is normal.     Breath sounds: Normal breath sounds.  Abdominal:     General: Bowel sounds are normal.     Palpations: Abdomen is soft.     Tenderness: There is right CVA tenderness.  Musculoskeletal:        General: Normal range of motion.     Right lower leg: No edema.     Left lower leg: No edema.  Skin:    General: Skin is warm and dry.     Findings: No rash.  Neurological:  Mental Status: She is alert and oriented to person, place, and time.  Psychiatric:        Mood and Affect: Mood normal.         Behavior: Behavior normal.        Thought Content: Thought content normal.        Judgment: Judgment normal.    Urine dipstick shows bili +1, positive for protein +1, positive for leukocytes trace, positive for urobilinogen, and positive for ketones.  Micro exam: Mucus thread present WBC's per HPF 6-10, few + bacteria, trichomonas present and epithelial cells 0-10.  Physical Exam MEASUREMENTS: Weight- 142. GENERAL: Pale appearance.     Results for orders placed or performed in visit on 09/08/24  [employer] Molecular Screen   Collection Time: 10/01/24  4:28 PM  Result Value Ref Range   Genetic Analysis Overall Interpretation Negative    Genetic Disease Assessed      This is a screening test and does not detect all pathogenic or likely pathogenic variant(s) in the tested genes; diagnostic testing is recommended for individuals with a personal or family history of heart disease or hereditary cancer. [employer] [facility]  Population Screen is a screening test that analyzes 11 genes related to hereditary breast and ovarian cancer (HBOC) syndrome, Lynch syndrome, and familial hypercholesterolemia. This test only reports clinically significant pathogenic and likely  pathogenic variants but does not report variants of uncertain significance (VUS). In addition, analysis of the PMS2 gene excludes exons 11-15, which overlap with a known pseudogene (PMS2CL).    Genetic Analysis Report      No pathogenic or likely pathogenic variants were detected in the genes analyzed by this test.Genetic test results should be interpreted in the context of an individual's personal medical and family history. Alteration to medical management is NOT  recommended based solely on this result. Clinical correlation is advised.Additional Considerations- This is a screening test; individuals may still carry pathogenic or likely pathogenic variant(s) in the tested genes that are not detected by this test.-  For individuals at  risk for these or other related conditions based on factors including personal or family history, diagnostic testing is recommended.- The absence of pathogenic or likely pathogenic variant(s) in the analyzed genes, while reassuring,  does not eliminate the possibility of a hereditary condition; there are other variants and genes associated with heart disease and hereditary cancer that are not included in this test.    Genes Tested See Notes    Disclaimer See Notes    Sequencing Location See Notes    Interpretation Methods and Limitations See Notes        Pertinent labs & imaging results that were available during my care of the patient were reviewed by me and considered in my medical decision making.  Assessment & Plan:  Gabrielle [name] was seen today for back pain.  Diagnoses and all orders for this visit:  Anxiety disorder with panic attacks -     hydrOXYzine  (ATARAX ) 10 MG tablet; Take 1 tablet (10 mg total) by mouth 3 (three) times daily as needed.  Acute right-sided low back pain without sciatica -     Urinalysis, Routine w reflex microscopic -     sulfamethoxazole -trimethoprim  (BACTRIM  DS) 800-160 MG tablet; Take 1 tablet by mouth 2 (two) times daily for 7 days. -     fluconazole  (DIFLUCAN ) 150 MG tablet; Take 1 tablet (150 mg total) by mouth once for 1 dose. -     Urine Culture -  metroNIDAZOLE  (FLAGYL ) 500 MG tablet; Take 1 tablet (500 mg total) by mouth 2 (two) times daily for 7 days. -     GC/Chlamydia Probe Amp -     HIV antibody (with reflex); Future  Cloudy urine -     Urinalysis, Routine w reflex microscopic -     sulfamethoxazole -trimethoprim  (BACTRIM  DS) 800-160 MG tablet; Take 1 tablet by mouth 2 (two) times daily for 7 days. -     fluconazole  (DIFLUCAN ) 150 MG tablet; Take 1 tablet (150 mg total) by mouth once for 1 dose. -     Urine Culture -     metroNIDAZOLE  (FLAGYL ) 500 MG tablet; Take 1 tablet (500 mg total) by mouth 2 (two) times daily for 7 days. -      GC/Chlamydia Probe Amp -     HIV antibody (with reflex); Future  Acute cystitis with hematuria -     sulfamethoxazole -trimethoprim  (BACTRIM  DS) 800-160 MG tablet; Take 1 tablet by mouth 2 (two) times daily for 7 days. -     fluconazole  (DIFLUCAN ) 150 MG tablet; Take 1 tablet (150 mg total) by mouth once for 1 dose. -     Urine Culture -     GC/Chlamydia Probe Amp -     HIV antibody (with reflex); Future  Trichomonas vaginalis (TV) infection -     metroNIDAZOLE  (FLAGYL ) 500 MG tablet; Take 1 tablet (500 mg total) by mouth 2 (two) times daily for 7 days. -     GC/Chlamydia Probe Amp -     HIV antibody (with reflex); Future     Assessment and Plan Gabrielle [name] is a 33 year old African-American female seen today back pain, no acute distress Assessment & Plan Low back pain Acute lower back pain without leg radiation or urinary symptoms.  Acute cystitis with hematuria Dark yellow urine suggests dehydration or infection. - Prescribed Bactrim  1 tablet twice a day for 7 days. - Sent urine for culture. - Prescribed Diflucan  post-antibiotics to prevent yeast infection.  Trichomonal vulvovaginitis Positive trichomonas test. - Prescribed Flagyl  for 7 days.,  No alcohol use while on Flagyl  - Ordered chlamydia, gonorrhea, and HIV tests. - Need to inform sexual partner so he can be treated - No intercourse while on treatment and until sexual partners treated - Practice safe sex, use condom to prevent STDs  Iron  deficiency anemia Low iron  saturation and blood count, fatigue post-menstruation. - Restart iron  supplements. - Take iron  with vitamin C. - Consume iron -rich foods.  Anxiety disorder with panic attacks Frequent situational panic attacks during medical procedures. - Prescribed Vistaril  10 mg as needed, up to three times a day. - Advised to inform healthcare providers about anxiety for pre-procedure medication.  Unintentional weight loss Weight loss despite adequate intake, oncologist  concerned. - Discuss weight loss with oncologist. - Increase protein intake. - Consider nutritional supplements like Boost or Ensure.      Continue all other maintenance medications.  Follow up plan: Return if symptoms worsen or fail to improve.   Continue healthy lifestyle choices, including diet (rich in fruits, vegetables, and lean proteins, and low in salt and simple carbohydrates) and exercise (at least 30 minutes of moderate physical activity daily).  Educational handout given for    Clinical References  Urinary Tract Infection, Female A urinary tract infection (UTI) is an infection in your urinary tract. The urinary tract is made up of organs that make, store, and get rid of pee (urine) in your body. These organs include: The kidneys. The  ureters. The bladder. The urethra. What are the causes? Most UTIs are caused by germs called bacteria. They may be in or near your genitals. These germs grow and cause swelling in your urinary tract. What increases the risk? You're more likely to get a UTI if: You're a female. The urethra is shorter in females than in males. You have a soft tube called a catheter that drains your pee. You can't control when you pee or poop. You have trouble peeing because of: A kidney stone. A urinary blockage. A nerve condition that affects your bladder. Not getting enough to drink. You're sexually active. You use a birth control inside your vagina, like spermicide. You're pregnant. You have low levels of the hormone estrogen in your body. You're an older adult. You're also more likely to get a UTI if you have other health problems. These may include: Diabetes. A weak immune system. Your immune system is your body's defense system. Sickle cell disease. Injury of the spine. What are the signs or symptoms? Symptoms may include: Needing to pee right away. Peeing small amounts often. Pain or burning when you pee. Blood in your pee. Pee that  smells bad or odd. Pain in your belly or lower back. You may also: Feel confused. This may be the first symptom in older adults. Vomit. Not feel hungry. Feel tired or easily annoyed. Have a fever or chills. How is this diagnosed? A UTI is diagnosed based on your medical history and an exam. You may also have other tests. These may include: Pee tests. Blood tests. Tests for sexually transmitted infections (STIs). If you've had more than one UTI, you may need to have imaging studies done to find out why you keep getting them. How is this treated? A UTI can be treated by: Taking antibiotics or other medicines. Drinking enough fluid to keep your pee pale yellow. In rare cases, a UTI can cause a very bad condition called sepsis. Sepsis may be treated in the hospital. Follow these instructions at home: Medicines Take your medicines only as told by your health care provider. If you were given antibiotics, take them as told by your provider. Do not stop taking them even if you start to feel better. General instructions Make sure you: Pee often and fully. Do not hold your pee for a long time. Wipe from front to back after you pee or poop. Use each tissue only once when you wipe. Pee after you have sex. Do not douche or use sprays or powders in your genital area. Contact a health care provider if: Your symptoms don't get better after 1-2 days of taking antibiotics. Your symptoms go away and then come back. You have a fever or chills. You vomit or feel like you may vomit. Get help right away if: You have very bad pain in your back or lower belly. You faint. This information is not intended to replace advice given to you by your health care provider. Make sure you discuss any questions you have with your health care provider. Document Revised: 10/15/2023 Document Reviewed: 02/07/2023 Elsevier Patient Education  2025 [employer]. Acute Back Pain, Adult Acute back pain is sudden and  usually short-lived. It is often caused by an injury to the muscles and tissues in the back. The injury may result from: A muscle, tendon, or ligament getting overstretched or torn. Ligaments are tissues that connect bones to each other. Lifting something improperly can cause a back strain. Wear and tear (degeneration) of the  spinal disks. Spinal disks are circular tissue that provide cushioning between the bones of the spine (vertebrae). Twisting motions, such as while playing sports or doing yard work. A hit to the back. Arthritis. You may have a physical exam, lab tests, and imaging tests to find the cause of your pain. Acute back pain usually goes away with rest and home care. Follow these instructions at home: Managing pain, stiffness, and swelling Take over-the-counter and prescription medicines only as told by your health care provider. Treatment may include medicines for pain and inflammation that are taken by mouth or applied to the skin, or muscle relaxants. Your health care provider may recommend applying ice during the first 24-48 hours after your pain starts. To do this: Put ice in a plastic bag. Place a towel between your skin and the bag. Leave the ice on for 20 minutes, 2-3 times a day. Remove the ice if your skin turns bright red. This is very important. If you cannot feel pain, heat, or cold, you have a greater risk of damage to the area. If directed, apply heat to the affected area as often as told by your health care provider. Use the heat source that your health care provider recommends, such as a moist heat pack or a heating pad. Place a towel between your skin and the heat source. Leave the heat on for 20-30 minutes. Remove the heat if your skin turns bright red. This is especially important if you are unable to feel pain, heat, or cold. You have a greater risk of getting burned. Activity  Do not stay in bed. Staying in bed for more than 1-2 days can delay your  recovery. Sit up and stand up straight. Avoid leaning forward when you sit or hunching over when you stand. If you work at a desk, sit close to it so you do not need to lean over. Keep your chin tucked in. Keep your neck drawn back, and keep your elbows bent at a 90-degree angle (right angle). Sit high and close to the steering wheel when you drive. Add lower back (lumbar) support to your car seat, if needed. Take short walks on even surfaces as soon as you are able. Try to increase the length of time you walk each day. Do not sit, drive, or stand in one place for more than 30 minutes at a time. Sitting or standing for long periods of time can put stress on your back. Do not drive or use heavy machinery while taking prescription pain medicine. Use proper lifting techniques. When you bend and lift, use positions that put less stress on your back: [place] your knees. Keep the load close to your body. Avoid twisting. Exercise regularly as told by your health care provider. Exercising helps your back heal faster and helps prevent back injuries by keeping muscles strong and flexible. Work with a physical therapist to make a safe exercise program, as recommended by your health care provider. Do any exercises as told by your physical therapist. Lifestyle Maintain a healthy weight. Extra weight puts stress on your back and makes it difficult to have good posture. Avoid activities or situations that make you feel anxious or stressed. Stress and anxiety increase muscle tension and can make back pain worse. Learn ways to manage anxiety and stress, such as through exercise. General instructions Sleep on a firm mattress in a comfortable position. Try lying on your side with your knees slightly bent. If you lie on your back, put a  pillow under your knees. Keep your head and neck in a straight line with your spine (neutral position) when using electronic equipment like smartphones or pads. To do this: Raise your  smartphone or pad to look at it instead of bending your head or neck to look down. Put the smartphone or pad at the level of your face while looking at the screen. Follow your treatment plan as told by your health care provider. This may include: Cognitive or behavioral therapy. Acupuncture or massage therapy. Meditation or yoga. Contact a health care provider if: You have pain that is not relieved with rest or medicine. You have increasing pain going down into your legs or buttocks. Your pain does not improve after 2 weeks. You have pain at night. You lose weight without trying. You have a fever or chills. You develop nausea or vomiting. You develop abdominal pain. Get help right away if: You develop new bowel or bladder control problems. You have unusual weakness or numbness in your arms or legs. You feel faint. These symptoms may represent a serious problem that is an emergency. Do not wait to see if the symptoms will go away. Get medical help right away. Call your local emergency services (911 in the [place] Do not drive yourself to the hospital. Summary Acute back pain is sudden and usually short-lived. Use proper lifting techniques. When you bend and lift, use positions that put less stress on your back. Take over-the-counter and prescription medicines only as told by your health care provider, and apply heat or ice as told. This information is not intended to replace advice given to you by your health care provider. Make sure you discuss any questions you have with your health care provider. Document Revised: 01/26/2021 Document Reviewed: 01/26/2021 Elsevier Patient Education  2024 [employer]. Mindfulness-Based Stress Reduction: What to Know Mindfulness-based stress reduction (MBSR) is a mindfulness meditation program that normally takes place over 8 weeks. It usually includes weekly group classes and daily exercises to do at home. What are the benefits of MBSR? Mindfulness  meditation therapies, like MBSR, can change a person's brain and body in good ways, and make them healthier. MBSR can have many benefits, such as: Helping to lower stress hormones. Decreasing symptoms or helping to deal with symptoms of different conditions, like: Anxiety, which is feeling worried or nervous. Long-lasting pain. This is pain that lasts more than 3 months. Stress and worry. Trouble sleeping. Headaches, like migraines and tension headaches. Irritable bowel syndrome. Helping to handle stress from things you can't control, like: Long-term illnesses, especially if you have a lot of pain or other difficult symptoms. Big life events. Stress at work. Stress from taking care of someone else. Types of MBSR exercises Mindfulness. This is a common type of meditation. Meditation. It helps you focus your mind to feel calm and happy. It has two main parts: paying attention and accepting. Paying attention means focusing on what is happening right now. This usually means noticing your breathing, your thoughts, how your body feels, and your emotions. Accepting means noticing these feelings and sensations without judging them. Instead of reacting to these thoughts or feelings, you just observe them and let them pass. MSBR exercises include: Body scanning. This is a mindfulness exercise where you pay attention to how different parts of your body feel. You can do this while lying down or sitting up. Sitting meditations. In this exercise, you focus on something like your breathing. When your mind starts to wander, gently bring  it back to your breath. Keep doing this every time you notice your mind wandering. Mindful movements. This exercise involves moving and stretching your body slowly while paying attention to how it feels. Mindful Tasks. This means paying attention to how your body feels while doing things like walking or eating. Follow these instructions at home:  Find an in-person MBSR  program or find a program that is online. Find a podcast or recording that provides guidance for MSBR exercises. Look for a therapist who knows how to use MBSR. Follow your treatment plan as told by your health care provider. This may include taking regular medicines and making changes to your diet or lifestyle. Where to find more information You can find more information about MBSR from: Your provider. Community-based meditation centers or programs. American Psychological Association at http://forbes-duran.com/. This information is not intended to replace advice given to you by your health care provider. Make sure you discuss any questions you have with your health care provider. Document Revised: 01/08/2024 Document Reviewed: 01/08/2024 Elsevier Patient Education  2025 [employer]. Managing Anxiety, Adult After being diagnosed with anxiety, you may be relieved to know why you have felt or behaved a certain way. You may also feel overwhelmed about the treatment ahead and what it will mean for your life. With care and support, you can manage your anxiety. How to manage lifestyle changes Understanding the difference between stress and anxiety Although stress can play a role in anxiety, it is not the same as anxiety. Stress is your body's reaction to life changes and events, both good and bad. Stress is often caused by something external, such as a deadline, test, or competition. It normally goes away after the event has ended and will last just a few hours. But, stress can be ongoing and can lead to more than just stress. Anxiety is caused by something internal, such as imagining a terrible outcome or worrying that something will go wrong that will greatly upset you. Anxiety often does not go away even after the event is over, and it can become a long-term (chronic) worry. Lowering stress and anxiety Talk with your health care provider or a counselor to learn more about  lowering anxiety and stress. They may suggest tension-reduction techniques, such as: Music. Spend time creating or listening to music that you enjoy and that inspires you. Mindfulness-based meditation. Practice being aware of your normal breaths while not trying to control your breathing. It can be done while sitting or walking. Centering prayer. Focus on a word, phrase, or sacred image that means something to you and brings you peace. Deep breathing. Expand your stomach and inhale slowly through your nose. Hold your breath for 3-5 seconds. Then breathe out slowly, letting your stomach muscles relax. Self-talk. Learn to notice and spot thought patterns that lead to anxiety reactions. Change those patterns to thoughts that feel peaceful. Muscle relaxation. Take time to tense muscles and then relax them. Choose a tension-reduction technique that fits your lifestyle and personality. These techniques take time and practice. Set aside 5-15 minutes a day to do them. Specialized therapists can offer counseling and training in these techniques. The training to help with anxiety may be covered by some insurance plans. Other things you can do to manage stress and anxiety include: Keeping a stress diary. This can help you learn what triggers your reaction and then learn ways to manage your response. Thinking about how you react to certain situations. You may not be able to control  everything, but you can control your response. Making time for activities that help you relax and not feeling guilty about spending your time in this way. Doing visual imagery. This involves imagining or creating mental pictures to help you relax. Practicing yoga. Through yoga poses, you can lower tension and relax.   Medicines Medicines for anxiety include: Antidepressant medicines. These are usually prescribed for long-term daily control. Anti-anxiety medicines. These may be added in severe cases, especially when panic attacks  occur. When used together, medicines, psychotherapy, and tension-reduction techniques may be the most effective treatment. Relationships Relationships can play a big part in helping you recover. Spend more time connecting with trusted friends and family members. Think about going to couples counseling if you have a partner, taking family education classes, or going to family therapy. Therapy can help you and others better understand your anxiety. How to recognize changes in your anxiety Everyone responds differently to treatment for anxiety. Recovery from anxiety happens when symptoms lessen and stop interfering with your daily life at home or work. This may mean that you will start to: Have better concentration and focus. Worry will interfere less in your daily thinking. Sleep better. Be less irritable. Have more energy. Have improved memory. Try to recognize when your condition is getting worse. Contact your provider if your symptoms interfere with home or work and you feel like your condition is not improving. Follow these instructions at home: Activity Exercise. Adults should: Exercise for at least 150 minutes each week. The exercise should increase your heart rate and make you sweat (moderate-intensity exercise). Do strengthening exercises at least twice a week. Get the right amount and quality of sleep. Most adults need 7-9 hours of sleep each night. Lifestyle  Eat a healthy diet that includes plenty of vegetables, fruits, whole grains, low-fat dairy products, and lean protein. Do not eat a lot of foods that are high in fats, added sugars, or salt (sodium). Make choices that simplify your life. Do not use any products that contain nicotine or tobacco. These products include cigarettes, chewing tobacco, and vaping devices, such as e-cigarettes. If you need help quitting, ask your provider. Avoid caffeine, alcohol, and certain over-the-counter cold medicines. These may make you feel  worse. Ask your pharmacist which medicines to avoid. General instructions Take over-the-counter and prescription medicines only as told by your provider. Keep all follow-up visits. This is to make sure you are managing your anxiety well or if you need more support. Where to find support You can get help and support from: Self-help groups. Online and [employer]. A trusted spiritual leader. Couples counseling. Family education classes. Family therapy. Where to find more information You may find that joining a support group helps you deal with your anxiety. The following sources can help you find counselors or support groups near you: Mental Health America: mentalhealthamerica.net Anxiety and Depression Association of America (ADAA): adaa.org The [employer] on Mental Illness (NAMI): nami.org Contact a health care provider if: You have a hard time staying focused or finishing tasks. You spend many hours a day feeling worried about everyday life. You are very tired because you cannot stop worrying. You start to have headaches or often feel tense. You have chronic nausea or diarrhea. Get help right away if: Your heart feels like it is racing. You have shortness of breath. You have thoughts of hurting yourself or others. Get help right away if you feel like you may hurt yourself or others, or have thoughts about taking your  own life. Go to your nearest emergency room or: Call 911. Call the National Suicide Prevention Lifeline at 478-796-0999 or 988. This is open 24 hours a day. Text the Crisis Text Line at (872) 523-5362. This information is not intended to replace advice given to you by your health care provider. Make sure you discuss any questions you have with your health care provider. Document Revised: 08/13/2022 Document Reviewed: 02/25/2021 Elsevier Patient Education  2024 Elsevier Inc. Trichomonas Test Why am I having this test? The trichomonas test is done to diagnose  trichomoniasis, which is a sexually transmitted infection (STI). You may have this test as a part of routine screening for STIs or if you have symptoms of trichomoniasis. In females, symptoms may include: Itching, burning, redness, or soreness in the genital area. Discomfort while urinating. An abnormal vaginal discharge. In males, symptoms may include: Discharge from the penis. Burning after urination or ejaculation. Itching or discomfort inside the penis. Many people with trichomoniasis do not have any symptoms (are asymptomatic) or have only minimal symptoms. Untreated trichomoniasis can last from months to years. What is being tested? This test checks your urine or body fluid for Trichomonas vaginalis, the parasite that causes this infection. What kind of sample is taken?     The sample required depends on the type of test that will be done. Your health care provider will do one of the following: Ask you to provide a urine sample. Take a sample of discharge. Your health care provider may use a swab to collect the sample. The sample may be taken from the vagina or cervix in females and from the urethra in males. How are the results reported? Your test results will be reported as either positive or negative. What do the results mean? Negative test result A negative result means that you do not have trichomoniasis. Follow your health care provider's instructions about any follow-up testing. Positive test result A positive result means that you have an active infection that needs to be treated with antimicrobial medicine. If your test result is positive, your health care provider may tell you: Not to have sex until you have finished your medicine and your symptoms have resolved. To talk to your sexual partner or partners about any symptoms you may have. All sexual partners must also be tested and treated, if necessary. If they are not treated, you will likely get reinfected. Talk with  your health care provider about what your results mean. In some cases, your health care provider may do more testing to confirm the results. Questions to ask your health care provider Ask your health care provider or the department that is doing the test: When will my results be ready? How will I get my results? What are my treatment options? What other tests do I need? What are my next steps? Summary The trichomonas test is done to diagnose trichomoniasis, a sexually transmitted infection (STI) caused by an organism called Trichomonas vaginalis. To perform the test, your health care provider will either take a sample of discharge or ask you to provide a urine sample. A positive result means that you have an active infection that needs to be treated with antimicrobial medicine. All your current sexual partners must also be treated if they test positive. If they are not treated, you will likely get reinfected. This information is not intended to replace advice given to you by your health care provider. Make sure you discuss any questions you have with your health care provider. Document  Revised: 10/03/2021 Document Reviewed: 10/03/2021 Elsevier Patient Education  2024 Elsevier Inc. Sexually Transmitted Infection Caused by a Parasite (Trichomoniasis): What to Know Trichomoniasis is a sexually transmitted infection (STI). Many people with this infection don't have any symptoms. This means you may not know that you have it, but you can still spread the infection to other people. If it's not treated, trichomoniasis can last for months or years. This condition is treated with medicine. What are the causes? This disease is caused by a parasite called Trichomonas vaginalis. It's spread through sex. What increases the risk? You're likely to get this infection if: You have unprotected sex. You have sex with someone who has the infection. You have sex with more than one person. You have had  trichomoniasis or other STIs in the past. What are the signs or symptoms? In females, symptoms include: Itching, burning, redness, or soreness in the genital area. Discomfort while peeing. Vaginal discharge that's clear, white, gray, or yellow-green. The discharge is foamy and has a fishy odor. In males, symptoms include: Discharge from the penis. Burning after peeing or ejaculation. Itching or discomfort inside the penis. How is this diagnosed? This infection is diagnosed based on tests. Your health care provider will: Do a pee test. Test a small amount of the discharge. In females, the discharge is taken from the vagina or the cervix. In males, the discharge is taken from the urethra. The urethra is the part of the body that drains urine from the bladder. Your provider may test for other STIs, including the human immunodeficiency virus (HIV). How is this treated? You'll be treated with: Antibiotics to kill the parasite, such as metronidazole  or tinidazole. You'll take these medicines by mouth. Over-the-counter medicines or creams. These help to relieve itching or irritation. If you plan to become pregnant or think you may be pregnant, tell your provider right away. Some medicines that are used to treat the infection should not be taken during pregnancy. Follow these instructions at home: Medicines Take your medicines only as told. Take your antibiotics as told. Do not stop taking them even if you start to feel better. Use creams as told by your provider. General instructions Do not have sex until you finish your medicine and your symptoms have gone away. Do not have sex if you have symptoms of trichomoniasis or another STI. Talk with your sex partner or partners about any symptoms that either of you may have, as well as any history of STIs. Your sex partner or partners need to be tested and treated. If they have the infection and they're not treated, you can get infected again. For  females: Do not douche. Douching may increase your risk for STIs because it removes good bacteria from the vagina. Do not wear tampons while you have the infection. Keep all follow-up visits. You may be tested for the infection again 3 months after treatment. Contact a health care provider if: You still have symptoms after you finish your medicine. You get a rash. You plan to become pregnant or think you may be pregnant. This information is not intended to replace advice given to you by your health care provider. Make sure you discuss any questions you have with your health care provider. Document Revised: 02/26/2024 Document Reviewed: 02/26/2024 Elsevier Patient Education  2025 [employer].  The above assessment and management plan was discussed with the patient. The patient verbalized understanding of and has agreed to the management plan. Patient is aware to call the clinic  if they develop any new symptoms or if symptoms persist or worsen. Patient is aware when to return to the clinic for a follow-up visit. Patient educated on when it is appropriate to go to the emergency department.   Sayre Mazor [place] Thompson, DNP [facility] [address] [place], [place] [address] [phone]

## 2024-10-13 LAB — URINE CULTURE

## 2024-10-13 LAB — GC/CHLAMYDIA PROBE AMP
Chlamydia trachomatis, NAA: NEGATIVE
Neisseria Gonorrhoeae by PCR: NEGATIVE

## 2024-10-13 LAB — HIV ANTIBODY (ROUTINE TESTING W REFLEX): HIV Screen 4th Generation wRfx: NONREACTIVE

## 2024-12-06 ENCOUNTER — Other Ambulatory Visit (HOSPITAL_COMMUNITY)
Admission: RE | Admit: 2024-12-06 | Discharge: 2024-12-06 | Disposition: A | Discharge status: Home / Self Care | Source: Ambulatory Visit | Attending: Nurse Practitioner | Admitting: Nurse Practitioner

## 2024-12-06 ENCOUNTER — Ambulatory Visit: Admitting: Nurse Practitioner

## 2024-12-06 ENCOUNTER — Encounter: Payer: Self-pay | Admitting: Nurse Practitioner

## 2024-12-06 VITALS — BP 100/60 | HR 85 | Temp 98.9°F | Ht 69.0 in | Wt 147.8 lb

## 2024-12-06 DIAGNOSIS — D649 Anemia, unspecified: Secondary | ICD-10-CM

## 2024-12-06 DIAGNOSIS — N3001 Acute cystitis with hematuria: Secondary | ICD-10-CM | POA: Diagnosis not present

## 2024-12-06 DIAGNOSIS — Z124 Encounter for screening for malignant neoplasm of cervix: Secondary | ICD-10-CM

## 2024-12-06 DIAGNOSIS — Z0000 Encounter for general adult medical examination without abnormal findings: Secondary | ICD-10-CM | POA: Insufficient documentation

## 2024-12-06 DIAGNOSIS — Z0001 Encounter for general adult medical examination with abnormal findings: Secondary | ICD-10-CM

## 2024-12-06 DIAGNOSIS — E162 Hypoglycemia, unspecified: Secondary | ICD-10-CM | POA: Diagnosis not present

## 2024-12-06 DIAGNOSIS — R7989 Other specified abnormal findings of blood chemistry: Secondary | ICD-10-CM

## 2024-12-06 LAB — MICROSCOPIC EXAMINATION
Renal Epithel, UA: NONE SEEN /HPF
Yeast, UA: NONE SEEN

## 2024-12-06 LAB — URINALYSIS, ROUTINE W REFLEX MICROSCOPIC
Bilirubin, UA: NEGATIVE
Glucose, UA: NEGATIVE
Ketones, UA: NEGATIVE
Nitrite, UA: NEGATIVE
Specific Gravity, UA: 1.02 (ref 1.005–1.030)
Urobilinogen, Ur: 1 mg/dL (ref 0.2–1.0)
pH, UA: 7 (ref 5.0–7.5)

## 2024-12-06 LAB — BAYER DCA HB A1C WAIVED: HB A1C (BAYER DCA - WAIVED): 4.7 % — ABNORMAL LOW (ref 4.8–5.6)

## 2024-12-06 MED ORDER — SULFAMETHOXAZOLE-TRIMETHOPRIM 800-160 MG PO TABS: 1.0000 | ORAL_TABLET | Freq: Two times a day (BID) | ORAL | 0 refills | Status: AC

## 2024-12-06 NOTE — Progress Notes (Signed)
 "  Complete physical exam  Patient: Gabrielle [name]   DOB: 07/03/1991   33 y.o. Female  MRN: [id number]  Subjective:    Chief Complaint  Patient presents with   Annual Exam    Gabrielle [name] is a 34 y.o. female who presents today for a complete physical exam. She reports consuming a general diet. The patient does not participate in regular exercise at present. She generally feels fairly well. She reports sleeping well. She does not have additional problems to discuss today.    Most recent fall risk assessment:    12/06/2024    8:34 AM  Fall Risk   Falls in the past year? 1  Number falls in past yr: 1  Injury with Fall? 0  Risk for fall due to : Impaired balance/gait  Follow up Falls evaluation completed     Most recent depression screenings:    12/06/2024    8:34 AM 10/11/2024   10:22 AM  PHQ 2/9 Scores  PHQ - 2 Score 1 0  PHQ- 9 Score 4 3    Vision:Not within last year , Dental: No current dental problems, and STD: The patient denies history of sexually transmitted disease.  Patient Active Problem List   Diagnosis Date Noted   Cloudy urine 10/11/2024   Anxiety disorder with panic attacks 10/11/2024   Acute right-sided low back pain without sciatica 10/11/2024   Diarrhea 08/11/2024   Mixed hyperlipidemia 08/09/2024   Anemia 08/09/2024   Encounter for general adult medical examination with abnormal findings 08/09/2024   S/P left mastectomy 01/01/2024   Other insomnia 08/18/2023   History of juvenile arthritis 04/10/2023   Nausea with vomiting 09/19/2022   Joint pain 09/19/2022   Encounter for antineoplastic chemotherapy 09/19/2022   Peripheral neuropathy 09/19/2022   Hypokalemia 09/19/2022   Port-A-Cath in place 06/14/2022   Genetic testing 03/29/2022   Family history of breast cancer 03/21/2022   Family history of prostate cancer 03/21/2022   Recurrent breast cancer, left (HCC) 03/15/2022   Past Medical History:  Diagnosis Date   Anemia    Anxiety     Arthritis    Bilateral Knees   Cancer (HCC) 03/11/2022   Left Breast   Family history of breast cancer 03/21/2022   Family history of prostate cancer 03/21/2022   Hypercholesteremia    Juvenile arthritis (HCC)    Neuropathy    Personal history of chemotherapy    Personal history of radiation therapy    Past Surgical History:  Procedure Laterality Date   AXILLARY SENTINEL NODE BIOPSY Left 05/14/2022   Procedure: LEFT AXILLARY SENTINEL NODE BIOPSY;  Surgeon: Ebbie Cough, MD;  Location: [facility] OR;  Service: General;  Laterality: Left;  GEN w/ PEC BLOCK   BREAST BIOPSY Left 03/11/2022   BREAST BIOPSY Left 11/25/2023   US  LT BREAST BX W LOC DEV 1ST LESION IMG BX SPEC US  GUIDE 11/25/2023 [facility]   BREAST LUMPECTOMY WITH AXILLARY LYMPH NODE BIOPSY Left 04/16/2022   Procedure: LEFT BREAST LUMPECTOMY WITH LEFT AXILLARY SENTINEL LYMPH NODE BIOPSY;  Surgeon: Ebbie Cough, MD;  Location: [facility];  Service: General;  Laterality: Left;   DENTAL SURGERY     PORTACATH PLACEMENT Right 04/16/2022   Procedure: INSERTION PORT-A-CATH;  Surgeon: Ebbie Cough, MD;  Location:  SURGERY CENTER;  Service: General;  Laterality: Right;   SENTINEL NODE BIOPSY Left 01/01/2024   Procedure: LEFT AXILLARY SENTINEL NODE BIOPSY;  Surgeon: Ebbie Cough, MD;  Location: [facility] OR;  Service: General;  Laterality: Left;   SIMPLE MASTECTOMY WITH AXILLARY SENTINEL NODE BIOPSY Left 01/01/2024   Procedure: LEFT MASTECTOMY;  Surgeon: Ebbie Cough, MD;  Location: [facility] OR;  Service: General;  Laterality: Left;   Social History[1] Social History   Socioeconomic History   Marital status: Single    Spouse name: Not on file   Number of children: 2   Years of education: Not on file   Highest education level: Some college, no degree  Occupational History   Not on file  Tobacco Use   Smoking status: Former    Current packs/day: 0.00    Average packs/day: 0.3 packs/day for 3.3  years (0.8 ttl pk-yrs)    Types: Cigarettes    Start date: 01/17/2019    Quit date: 01/16/2022    Years since quitting: 2.8    Passive exposure: Past   Smokeless tobacco: Never  Vaping Use   Vaping status: Never Used  Substance and Sexual Activity   Alcohol use: Not Currently    Comment: social   Drug use: Yes    Frequency: 3.0 times per week    Types: Marijuana   Sexual activity: Yes    Birth control/protection: None  Other Topics Concern   Not on file  Social History Narrative   Right handed   Caffeine: not really   Social Drivers of Health   Tobacco Use: Medium Risk (12/06/2024)   Patient History    Smoking Tobacco Use: Former    Smokeless Tobacco Use: Never    Passive Exposure: Past  Physicist, Medical Strain: Medium Risk (10/11/2024)   Overall Financial Resource Strain (CARDIA)    Difficulty of Paying Living Expenses: Somewhat hard  Food Insecurity: No Food Insecurity (12/06/2024)   Epic    Worried About Programme Researcher, Broadcasting/film/video in the Last Year: Never true    Ran Out of Food in the Last Year: Never true  Transportation Needs: Unknown (12/06/2024)   Epic    Lack of Transportation (Medical): No    Lack of Transportation (Non-Medical): Not on file  Recent Concern: Transportation Needs - Unmet Transportation Needs (10/11/2024)   Epic    Lack of Transportation (Medical): Yes    Lack of Transportation (Non-Medical): Yes  Physical Activity: Sufficiently Active (10/11/2024)   Exercise Vital Sign    Days of Exercise per Week: 7 days    Minutes of Exercise per Session: 30 min  Stress: Stress Concern Present (10/11/2024)   [employer] of Occupational Health - Occupational Stress Questionnaire    Feeling of Stress: To some extent  Social Connections: Moderately Isolated (10/11/2024)   Social Connection and Isolation Panel    Frequency of Communication with Friends and Family: Once a week    Frequency of Social Gatherings with Friends and Family: Once a week    Attends  Religious Services: More than 4 times per year    Active Member of [employer] or Organizations: Yes    Attends Banker Meetings: More than 4 times per year    Marital Status: Never married  Intimate Partner Violence: Not At Risk (01/01/2024)   Humiliation, Afraid, Rape, and Kick questionnaire    Fear of Current or Ex-Partner: No    Emotionally Abused: No    Physically Abused: No    Sexually Abused: No  Depression (PHQ2-9): Low Risk (12/06/2024)   Depression (PHQ2-9)    PHQ-2 Score: 4  Alcohol Screen: Low Risk (08/09/2024)   Alcohol Screen    Last Alcohol Screening Score (  AUDIT): 0  Housing: Low Risk (12/06/2024)   Epic    Unable to Pay for Housing in the Last Year: No    Number of Times Moved in the Last Year: 0    Homeless in the Last Year: No  Recent Concern: Housing - High Risk (10/11/2024)   Epic    Unable to Pay for Housing in the Last Year: Patient declined    Number of Times Moved in the Last Year: 3    Homeless in the Last Year: Patient declined  Utilities: Not At Risk (12/06/2024)   Epic    Threatened with loss of utilities: No  Health Literacy: Not on file   Family Status  Relation Name Status   Mother Cassidie Veiga Alive   Father  Alive   Brother Verdel Haste (Not Specified)   PGM Almarie linen Deceased   PGF  Deceased   Son  Alive   Son Josefa Pacifico (Not Specified)   Mat Aunt Gerl lowe Alive   Bruna Nyhan  (Not Specified)   Bruna Brigham  (Not Specified)   Niece  Alive   Neg Hx  (Not Specified)  No partnership data on file   Family History  Problem Relation Age of Onset   Breast cancer Mother 96   Other Mother        sleeping problems   Prostate cancer Father 77   Diabetes Brother    Breast cancer Paternal Grandmother        dx after 91   Prostate cancer Paternal Grandfather        dx 1s; metastatic   ADD / ADHD Son    Breast cancer Maternal Aunt 76   Breast cancer Paternal Aunt        dx unknown age   Prostate cancer Paternal Uncle        dx  after 68   Cystic fibrosis Niece    Sleep apnea Neg Hx    Allergies[2]    Patient Care Team: [facility], Nena, NP as PCP - General (Nurse Practitioner) Tyree Nanetta SAILOR, RN as Oncology Nurse Navigator Ebbie Cough, MD as Consulting Physician (General Surgery) Lanny Callander, MD as Consulting Physician (Hematology) Dewey [name], MD as Consulting Physician (Radiation Oncology) Heide Ingle, MD as Consulting Physician (Orthopedic Surgery) Neomi Johnston ONEIDA DEVONNA as Physician Assistant (Hematology and Oncology) Imaging, [facility] as Radiologist (Diagnostic Radiology) Arelia Filippo, MD as Consulting Physician (Plastic Surgery)   Show/hide medication list[3]  Review of Systems  Constitutional:  Negative for fever and weight loss.  HENT:  Negative for congestion and sore throat.   Cardiovascular:  Negative for chest pain and leg swelling.  Gastrointestinal:  Negative for abdominal pain, blood in stool, constipation, nausea and vomiting.  Genitourinary:  Negative for dysuria, frequency and hematuria.  Musculoskeletal:  Negative for joint pain.  Skin:  Negative for itching and rash.  Neurological:  Negative for dizziness and headaches.  Psychiatric/Behavioral:  Negative for depression and suicidal ideas. The patient is not nervous/anxious and does not have insomnia.    Negative unless indicated in HPI    Objective:     BP 100/60   Pulse 85   Temp 98.9 F (37.2 C)   Ht 5' 9 (1.753 m)   Wt 147 lb 12.8 oz (67 kg)   LMP 12/03/2024   SpO2 99%   BMI 21.83 kg/m  BP Readings from Last 3 Encounters:  12/06/24 100/60  10/11/24 100/67  08/09/24 93/61  Wt Readings from Last 3 Encounters:  12/06/24 147 lb 12.8 oz (67 kg)  10/11/24 142 lb 3.2 oz (64.5 kg)  08/09/24 147 lb 12.8 oz (67 kg)      Physical Exam Vitals and nursing note reviewed. Chaperone present: no chaperon in the room, pt preference.  Constitutional:      General: She is not  in acute distress. HENT:     Head: Normocephalic and atraumatic.     Right Ear: Tympanic membrane, ear canal and external ear normal. There is no impacted cerumen.     Left Ear: Tympanic membrane, ear canal and external ear normal. There is no impacted cerumen.     Nose: Nose normal.     Mouth/Throat:     Mouth: Mucous membranes are moist.  Eyes:     Extraocular Movements: Extraocular movements intact.     Conjunctiva/sclera: Conjunctivae normal.     Pupils: Pupils are equal, round, and reactive to light.  Neck:     Vascular: No carotid bruit.  Cardiovascular:     Heart sounds: Normal heart sounds.  Pulmonary:     Effort: Pulmonary effort is normal.     Breath sounds: Normal breath sounds.  Chest:  Breasts:    Right: Normal. No swelling, inverted nipple, mass, nipple discharge or tenderness.     Left: Absent.     Comments: Mastectomy left breast Abdominal:     General: Bowel sounds are normal.     Palpations: Abdomen is soft.     Tenderness: There is no abdominal tenderness. There is no guarding or rebound.     Hernia: There is no hernia in the left inguinal area or right inguinal area.  Genitourinary:    Exam position: Prone.     Pubic Area: No rash or pubic lice.      Labia:        Right: No rash, tenderness or lesion.        Left: No rash or tenderness.      Vagina: Normal.     Cervix: Normal. No friability.     Uterus: Normal.      Adnexa: Right adnexa normal and left adnexa normal.     Comments: On cycle Musculoskeletal:        General: Normal range of motion.     Cervical back: Normal range of motion and neck supple. No rigidity or tenderness.     Right lower leg: No edema.  Lymphadenopathy:     Cervical: No cervical adenopathy.     Upper Body:     Right upper body: No supraclavicular, axillary or pectoral adenopathy.     Left upper body: No supraclavicular, axillary or pectoral adenopathy.     Lower Body: No right inguinal adenopathy. No left inguinal  adenopathy.  Skin:    General: Skin is warm and dry.     Findings: No rash.  Neurological:     Mental Status: She is alert and oriented to person, place, and time.  Psychiatric:        Mood and Affect: Mood normal.        Behavior: Behavior normal.        Thought Content: Thought content normal.        Judgment: Judgment normal.      No results found for any visits on 12/06/24. Last CBC Lab Results  Component Value Date   WBC 6.6 08/09/2024   HGB 11.1 08/09/2024   HCT 34.8 08/09/2024   MCV 83 08/09/2024  MCH 26.6 08/09/2024   RDW 13.7 08/09/2024   PLT 246 08/09/2024   Last metabolic panel Lab Results  Component Value Date   GLUCOSE 63 (L) 08/09/2024   NA 138 08/09/2024   K 3.6 08/09/2024   CL 104 08/09/2024   CO2 20 08/09/2024   BUN 16 08/09/2024   CREATININE 0.78 08/09/2024   EGFR 102 08/09/2024   CALCIUM  10.0 08/09/2024   PROT 6.7 08/09/2024   ALBUMIN  4.2 08/09/2024   LABGLOB 2.5 08/09/2024   BILITOT 0.4 08/09/2024   ALKPHOS 112 08/09/2024   AST 26 08/09/2024   ALT 11 08/09/2024   ANIONGAP 5 04/05/2024   Last lipids Lab Results  Component Value Date   CHOL 184 08/09/2024   HDL 43 08/09/2024   LDLCALC 119 (H) 08/09/2024   TRIG 123 08/09/2024   CHOLHDL 4.3 08/09/2024   Last hemoglobin A1c Lab Results  Component Value Date   HGBA1C 4.6 (L) 08/09/2024   Last thyroid  functions Lab Results  Component Value Date   TSH 2.560 08/09/2024   T4TOTAL 7.5 08/09/2024   Last vitamin D No results found for: 25OHVITD2, 25OHVITD3, VD25OH      Assessment & Plan:    Routine Health Maintenance and Physical Exam  Discussed health benefits of physical activity, and encouraged her to engage in regular exercise appropriate for her age and condition.  Anemia, unspecified type -     Anemia Profile B  Pap smear for cervical cancer screening -     Urinalysis, Routine w reflex microscopic -     Cytology - PAP  Annual physical exam -     CMP14+EGFR -      Lipid panel -     Urinalysis, Routine w reflex microscopic -     Cytology - PAP  Hypoglycemia -     Bayer DCA Hb A1c Waived  Abnormal TSH -     Thyroid  Panel With TSH   Chessie is a 34 year old African-American female seen today for annual physical with Pap, no acute distress   Anemia  continue ferrous sulfate   TSH Repeat Thyroid  function   Pap Performed, sample collected  Labs Anemia panel, TSH, A1c, CMP Lipid, UA resutls pending     Clinical References  Anemia  Anemia is a condition in which there are not enough red blood cells or hemoglobin in the blood. Hemoglobin is a substance in red blood cells that carries oxygen. When you do not have enough red blood cells or hemoglobin (are anemic), your body cannot get enough oxygen, and your organs may not work properly. As a result, you may feel very tired or have other problems. What are the causes? Common causes of anemia include: Excessive bleeding. Anemia can be caused by excessive bleeding inside or outside the body, including bleeding from the intestines or from heavy menstrual periods in females. Poor nutrition. Long-lasting (chronic) kidney, thyroid , and liver disease. Bone marrow disorders, spleen problems, and blood disorders. Cancer and treatments for cancer. Human immunodeficiency virus (HIV) and acquired immunodeficiency syndrome (AIDS). Infections, medicines, and autoimmune disorders that destroy red blood cells. What are the signs or symptoms? Symptoms of this condition include: Minor weakness. Dizziness. Headache, or difficulties concentrating and sleeping. Heartbeats that feel irregular or faster than normal (palpitations). Shortness of breath, especially with exercise. Pale skin, lips, and nails, or cold hands and feet. Upset stomach (indigestion) and nausea. Symptoms may occur suddenly or develop slowly. If your anemia is mild, you may not have symptoms. How is this  diagnosed? This condition is  diagnosed based on blood tests, your medical history, and a physical exam. In some cases, a test may be needed in which cells are removed from the soft tissue inside of a bone and looked at under a microscope (bone marrow biopsy). Your health care provider may also check your stool (feces) for blood and may do more testing to look for the cause of your bleeding. Other tests may include: Imaging tests, such as a CT scan or MRI. A procedure to see inside your esophagus and stomach (endoscopy). The esophagus is the part of the body that moves food from your mouth to your stomach. A procedure to see inside your colon and rectum (colonoscopy). How is this treated? Treatment for this condition depends on the cause. If you continue to lose a lot of blood, you may need to be treated at a hospital. Treatment may include: Taking supplements of iron , vitamin B12, or folic acid . Taking a hormone medicine (erythropoietin) that can help to stimulate red blood cell growth. Receiving donated blood through an IV (blood transfusion). This may be needed if you lose a lot of blood. Making changes to your diet. Having surgery to remove your spleen. Follow these instructions at home: Take over-the-counter and prescription medicines only as told by your health care provider. Take supplements only as told by your health care provider. Follow any diet instructions that you were given by your health care provider. Keep all follow-up visits. Your health care provider will want to recheck your blood tests. Contact a health care provider if: You develop new bleeding anywhere in the body. You are very weak. Get help right away if: You are short of breath. You have pain in your abdomen or chest. You are dizzy or feel faint. You have trouble concentrating. You have bloody stools, black stools, or tarry stools. You vomit repeatedly or you vomit up blood. These symptoms may be an emergency. Get help right away. Call  911. Do not wait to see if the symptoms will go away. Do not drive yourself to the hospital. Summary Anemia is a condition in which you do not have enough red blood cells or enough of a substance in your red blood cells that carries oxygen. Symptoms may occur suddenly or develop slowly. If your anemia is mild, you may not have symptoms. This condition is diagnosed with blood tests, a medical history, and a physical exam. Other tests may be needed. Treatment for this condition depends on the cause of the anemia. This information is not intended to replace advice given to you by your health care provider. Make sure you discuss any questions you have with your health care provider. Document Revised: 01/28/2022 Document Reviewed: 01/28/2022 Elsevier Patient Education  2024 Elsevier Inc. Health Maintenance, Female Adopting a healthy lifestyle and getting preventive care are important in promoting health and wellness. Ask your health care provider about: The right schedule for you to have regular tests and exams. Things you can do on your own to prevent diseases and keep yourself healthy. What should I know about diet, weight, and exercise? Eat a healthy diet  Eat a diet that includes plenty of vegetables, fruits, low-fat dairy products, and lean protein. Do not eat a lot of foods that are high in solid fats, added sugars, or sodium. Maintain a healthy weight Body mass index (BMI) is used to identify weight problems. It estimates body fat based on height and weight. Your health care provider can help determine  your BMI and help you achieve or maintain a healthy weight. Get regular exercise Get regular exercise. This is one of the most important things you can do for your health. Most adults should: Exercise for at least 150 minutes each week. The exercise should increase your heart rate and make you sweat (moderate-intensity exercise). Do strengthening exercises at least twice a week. This is in  addition to the moderate-intensity exercise. Spend less time sitting. Even light physical activity can be beneficial. Watch cholesterol and blood lipids Have your blood tested for lipids and cholesterol at 35 years of age, then have this test every 5 years. Have your cholesterol levels checked more often if: Your lipid or cholesterol levels are high. You are older than 35 years of age. You are at high risk for heart disease. What should I know about cancer screening? Depending on your health history and family history, you may need to have cancer screening at various ages. This may include screening for: Breast cancer. Cervical cancer. Colorectal cancer. Skin cancer. Lung cancer. What should I know about heart disease, diabetes, and high blood pressure? Blood pressure and heart disease High blood pressure causes heart disease and increases the risk of stroke. This is more likely to develop in people who have high blood pressure readings or are overweight. Have your blood pressure checked: Every 3-5 years if you are 2-80 years of age. Every year if you are 52 years old or older. Diabetes Have regular diabetes screenings. This checks your fasting blood sugar level. Have the screening done: Once every three years after age 32 if you are at a normal weight and have a low risk for diabetes. More often and at a younger age if you are overweight or have a high risk for diabetes. What should I know about preventing infection? Hepatitis B If you have a higher risk for hepatitis B, you should be screened for this virus. Talk with your health care provider to find out if you are at risk for hepatitis B infection. Hepatitis C Testing is recommended for: Everyone born from [birth year] through 1966. Anyone with known risk factors for hepatitis C. Sexually transmitted infections (STIs) Get screened for STIs, including gonorrhea and chlamydia, if: You are sexually active and are younger than 35 years of  age. You are older than 35 years of age and your health care provider tells you that you are at risk for this type of infection. Your sexual activity has changed since you were last screened, and you are at increased risk for chlamydia or gonorrhea. Ask your health care provider if you are at risk. Ask your health care provider about whether you are at high risk for HIV. Your health care provider may recommend a prescription medicine to help prevent HIV infection. If you choose to take medicine to prevent HIV, you should first get tested for HIV. You should then be tested every 3 months for as long as you are taking the medicine. Pregnancy If you are about to stop having your period (premenopausal) and you may become pregnant, seek counseling before you get pregnant. Take 400 to 800 micrograms (mcg) of folic acid  every day if you become pregnant. Ask for birth control (contraception) if you want to prevent pregnancy. Osteoporosis and menopause Osteoporosis is a disease in which the bones lose minerals and strength with aging. This can result in bone fractures. If you are 7 years old or older, or if you are at risk for osteoporosis and fractures, ask  your health care provider if you should: Be screened for bone loss. Take a calcium  or vitamin D supplement to lower your risk of fractures. Be given hormone replacement therapy (HRT) to treat symptoms of menopause. Follow these instructions at home: Alcohol use Do not drink alcohol if: Your health care provider tells you not to drink. You are pregnant, may be pregnant, or are planning to become pregnant. If you drink alcohol: Limit how much you have to: 0-1 drink a day. Know how much alcohol is in your drink. In the [place] one drink equals one 12 oz bottle of beer (355 mL), one 5 oz glass of wine (148 mL), or one 1 oz glass of hard liquor (44 mL). Lifestyle Do not use any products that contain nicotine or tobacco. These products include  cigarettes, chewing tobacco, and vaping devices, such as e-cigarettes. If you need help quitting, ask your health care provider. Do not use street drugs. Do not share needles. Ask your health care provider for help if you need support or information about quitting drugs. General instructions Schedule regular health, dental, and eye exams. Stay current with your vaccines. Tell your health care provider if: You often feel depressed. You have ever been abused or do not feel safe at home. Summary Adopting a healthy lifestyle and getting preventive care are important in promoting health and wellness. Follow your health care provider's instructions about healthy diet, exercising, and getting tested or screened for diseases. Follow your health care provider's instructions on monitoring your cholesterol and blood pressure. This information is not intended to replace advice given to you by your health care provider. Make sure you discuss any questions you have with your health care provider. Document Revised: 03/26/2021 Document Reviewed: 03/26/2021 Elsevier Patient Education  2024 [employer]. Preventive Care 47-62 Years Old, Female Preventive care refers to lifestyle choices and visits with your health care provider that can promote health and wellness. Preventive care visits are also called wellness exams. What can I expect for my preventive care visit? Counseling During your preventive care visit, your health care provider may ask about your: Medical history, including: Past medical problems. Family medical history. Pregnancy history. Current health, including: Menstrual cycle. Method of birth control. Emotional well-being. Home life and relationship well-being. Sexual activity and sexual health. Lifestyle, including: Alcohol, nicotine or tobacco, and drug use. Access to firearms. Diet, exercise, and sleep habits. Work and work astronomer. Sunscreen use. Safety issues such as  seatbelt and bike helmet use. Physical exam Your health care provider may check your: Height and weight. These may be used to calculate your BMI (body mass index). BMI is a measurement that tells if you are at a healthy weight. Waist circumference. This measures the distance around your waistline. This measurement also tells if you are at a healthy weight and may help predict your risk of certain diseases, such as type 2 diabetes and high blood pressure. Heart rate and blood pressure. Body temperature. Skin for abnormal spots. What immunizations do I need?  Vaccines are usually given at various ages, according to a schedule. Your health care provider will recommend vaccines for you based on your age, medical history, and lifestyle or other factors, such as travel or where you work. What tests do I need? Screening Your health care provider may recommend screening tests for certain conditions. This may include: Pelvic exam and Pap test. Lipid and cholesterol levels. Diabetes screening. This is done by checking your blood sugar (glucose) after you have not  eaten for a while (fasting). Hepatitis B test. Hepatitis C test. HIV (human immunodeficiency virus) test. STI (sexually transmitted infection) testing, if you are at risk. BRCA-related cancer screening. This may be done if you have a family history of breast, ovarian, tubal, or peritoneal cancers. Talk with your health care provider about your test results, treatment options, and if necessary, the need for more tests. Follow these instructions at home: Eating and drinking  Eat a healthy diet that includes fresh fruits and vegetables, whole grains, lean protein, and low-fat dairy products. Take vitamin and mineral supplements as recommended by your health care provider. Do not drink alcohol if: Your health care provider tells you not to drink. You are pregnant, may be pregnant, or are planning to become pregnant. If you drink  alcohol: Limit how much you have to 0-1 drink a day. Know how much alcohol is in your drink. In the [place] one drink equals one 12 oz bottle of beer (355 mL), one 5 oz glass of wine (148 mL), or one 1 oz glass of hard liquor (44 mL). Lifestyle Brush your teeth every morning and night with fluoride toothpaste. Floss one time each day. Exercise for at least 30 minutes 5 or more days each week. Do not use any products that contain nicotine or tobacco. These products include cigarettes, chewing tobacco, and vaping devices, such as e-cigarettes. If you need help quitting, ask your health care provider. Do not use drugs. If you are sexually active, practice safe sex. Use a condom or other form of protection to prevent STIs. If you do not wish to become pregnant, use a form of birth control. If you plan to become pregnant, see your health care provider for a prepregnancy visit. Find healthy ways to manage stress, such as: Meditation, yoga, or listening to music. Journaling. Talking to a trusted person. Spending time with friends and family. Minimize exposure to UV radiation to reduce your risk of skin cancer. Safety Always wear your seat belt while driving or riding in a vehicle. Do not drive: If you have been drinking alcohol. Do not ride with someone who has been drinking. If you have been using any mind-altering substances or drugs. While texting. When you are tired or distracted. Wear a helmet and other protective equipment during sports activities. If you have firearms in your house, make sure you follow all gun safety procedures. Seek help if you have been physically or sexually abused. What's next? Go to your health care provider once a year for an annual wellness visit. Ask your health care provider how often you should have your eyes and teeth checked. Stay up to date on all vaccines. This information is not intended to replace advice given to you by your health care provider. Make  sure you discuss any questions you have with your health care provider. Document Revised: 05/02/2021 Document Reviewed: 05/02/2021 Elsevier Patient Education  2024 [employer] Hypoglycemia Hypoglycemia is when the amount of sugar, or glucose, in your blood is too low. Low blood sugar can happen if you have diabetes or if you don't have diabetes. It may be an emergency. What are the causes? Low blood sugar happens most often in people who have diabetes. It may be caused by: Diabetes medicine. Not eating enough, or not eating often enough. Being more active than normal. If you don't have diabetes, you may still get low blood sugar if: There's a tumor in your pancreas. A tumor is a growth of cells that isn't  normal. You don't eat enough, or you fast. Fasting is when you don't eat for long periods at a time. You have a bad infection or illness. You have problems after weight loss surgery. You have kidney or liver problems. You take certain medicines. What increases the risk? You're more likely to have low blood sugar if: You have diabetes and take medicine for it. You drink a lot of alcohol. You get sick. What are the signs or symptoms? Mild Hunger or feeling like you may vomit. Sweating and feeling cold to the touch. Feeling dizzy or light-headed. Being sleepy or having trouble sleeping. A headache. Blurry vision. Mood changes. These include feeling worried, nervous, or easily annoyed. Moderate Feeling confused. Changes in the way you act. Weakness. An uneven heartbeat. Very bad Having very low blood sugar is an emergency. It can cause: Fainting. Seizures. A coma. Death. How is this diagnosed?  Low blood sugar can be found with a blood test. This test tells you how much sugar is in your blood. It's done while you're having symptoms. Your health care provider may also do an exam and look at your medical history. How is this treated? Treating low blood sugar If you have  low blood sugar, eat or drink something with sugar in it right away. The food or drink should have 15 grams of a fast-acting carbohydrate (carb). Options include: 4 oz (120 mL) of fruit juice. 4 oz (120 mL) of soda (not diet soda). A few pieces of hard candy. Check food labels to see how many pieces to eat. 1 Tbsp (15 mL) of sugar or honey. 4 glucose tablets. 1 tube of glucose gel. Treating low blood sugar if you have diabetes Talk with your provider about how much carb you should take. If you're alert and can swallow safely, you may follow the 15:15 rule: Take 15 grams of a fast-acting carb. Check your blood sugar 15 minutes after you take the carb. If your blood sugar is still at or below 70 mg/dL (3.9 mmol/L), take 15 grams of a carb again. If your blood sugar doesn't go above 70 mg/dL (3.9 mmol/L) after 3 tries, get help right away. After your blood sugar goes back to normal, eat a meal or a snack within 1 hour. Always keep 15 grams of a fast-acting carb with you. This could be: 4 glucose tablets. A few pieces of hard candy. 1 Tbsp (15 mL) of honey or sugar. 1 tube of glucose gel. Treating very low blood sugar If your blood sugar is less than 54 mg/dL (3 mmol/L), it's an emergency. Get help right away. If you can't eat or drink, you will need to be given glucagon. A family member or friend should learn how to check your blood sugar and give you glucagon. Ask your provider if you should keep a glucagon kit at home. You may also need to be treated in a hospital. Follow these instructions at home: If you have diabetes: Always keep a fast-acting carb (15 grams) with you. Follow your diabetes care plan. Make sure you: Know the symptoms of low blood sugar. Check your blood sugar as often as told. Always check it before and after you exercise. Always check your blood sugar before you drive. Take your medicines as told. Eat on time. Do not skip meals. Share your diabetes care plan  with: Your work or school. The people you live with. Wear an alert bracelet or carry a card that says you have diabetes. General instructions  If you drink alcohol: Limit how much you have to: 0-1 drink a day if you're female. 0-2 drinks a day if you're female. Know how much alcohol is in your drink. In the [place] one drink is one 12 oz bottle of beer (355 mL), one 5 oz glass of wine (148 mL), or one 1 oz glass of hard liquor (44 mL). Be sure to eat food when you drink alcohol. Be sure to check your blood sugar after you drink. Alcohol may lead to low blood sugar later. Where to find more information American Diabetes Association (ADA): diabetes.org Contact a health care provider if: You have low blood sugar often. You have diabetes and are having trouble keeping your blood sugar in the right range. Get help right away if: You can't get your blood sugar above 70 mg/dL (3.9 mmol/L) after 3 tries. Your blood sugar is below 54 mg/dL (3 mmol/L). You have a seizure. You faint. These symptoms may be an emergency. Call 911 right away. Do not wait to see if the symptoms will go away. Do not drive yourself to the hospital. This information is not intended to replace advice given to you by your health care provider. Make sure you discuss any questions you have with your health care provider. Document Revised: 08/07/2023 Document Reviewed: 01/22/2023 Elsevier Patient Education  2024 [employer]. Return in about 6 months (around 06/05/2025) for Chronic disease management.     Nena Cassis Morton Hummer, [place] [facility] [address] [place], [place] [address] [phone]      [1]  Social History Tobacco Use   Smoking status: Former    Current packs/day: 0.00    Average packs/day: 0.3 packs/day for 3.3 years (0.8 ttl pk-yrs)    Types: Cigarettes    Start date: 01/17/2019    Quit date: 01/16/2022    Years since quitting: 2.8    Passive exposure: Past    Smokeless tobacco: Never  Vaping Use   Vaping status: Never Used  Substance Use Topics   Alcohol use: Not Currently    Comment: social   Drug use: Yes    Frequency: 3.0 times per week    Types: Marijuana  [2]  Allergies Allergen Reactions   Oxycodone  Itching  [3]  Outpatient Medications Prior to Visit  Medication Sig   hydrOXYzine  (ATARAX ) 10 MG tablet Take 1 tablet (10 mg total) by mouth 3 (three) times daily as needed.   Iron , Ferrous Sulfate , 325 (65 Fe) MG TABS Take 325 mg by mouth daily. (Patient not taking: Reported on 12/06/2024)   [DISCONTINUED] diphenhydrAMINE  (BENADRYL ) 50 MG/ML injection    [DISCONTINUED] famotidine  (PEPCID ) 20-0.9 MG/50ML-% IVPB    No facility-administered medications prior to visit.   "

## 2024-12-06 NOTE — Addendum Note (Signed)
 Addended by: DEITRA MORTON HUMMER, NENA on: 12/06/2024 11:05 AM   Modules accepted: Orders

## 2024-12-07 ENCOUNTER — Ambulatory Visit: Payer: Self-pay | Admitting: Nurse Practitioner

## 2024-12-07 LAB — ANEMIA PROFILE B
Basophils Absolute: 0 x10E3/uL (ref 0.0–0.2)
Basos: 0 %
EOS (ABSOLUTE): 0.3 x10E3/uL (ref 0.0–0.4)
Eos: 5 %
Ferritin: 255 ng/mL — AB (ref 15–150)
Folate: 4.8 ng/mL
Hematocrit: 33.5 % — ABNORMAL LOW (ref 34.0–46.6)
Hemoglobin: 10.5 g/dL — ABNORMAL LOW (ref 11.1–15.9)
Immature Grans (Abs): 0 x10E3/uL (ref 0.0–0.1)
Immature Granulocytes: 0 %
Iron Saturation: 28 % (ref 15–55)
Iron: 53 ug/dL (ref 27–159)
Lymphocytes Absolute: 3.4 x10E3/uL — ABNORMAL HIGH (ref 0.7–3.1)
Lymphs: 60 %
MCH: 26.5 pg — ABNORMAL LOW (ref 26.6–33.0)
MCHC: 31.3 g/dL — ABNORMAL LOW (ref 31.5–35.7)
MCV: 85 fL (ref 79–97)
Monocytes Absolute: 0.4 x10E3/uL (ref 0.1–0.9)
Monocytes: 7 %
Neutrophils Absolute: 1.6 x10E3/uL (ref 1.4–7.0)
Neutrophils: 28 %
Platelets: 205 x10E3/uL (ref 150–450)
RBC: 3.96 x10E6/uL (ref 3.77–5.28)
RDW: 13.6 % (ref 11.7–15.4)
Retic Ct Pct: 1.9 % (ref 0.6–2.6)
Total Iron Binding Capacity: 189 ug/dL — ABNORMAL LOW (ref 250–450)
UIBC: 136 ug/dL (ref 131–425)
Vitamin B-12: 519 pg/mL (ref 232–1245)
WBC: 5.7 x10E3/uL (ref 3.4–10.8)

## 2024-12-07 LAB — CMP14+EGFR
ALT: 10 IU/L (ref 0–32)
AST: 24 IU/L (ref 0–40)
Albumin: 3.9 g/dL (ref 3.9–4.9)
Alkaline Phosphatase: 156 IU/L — ABNORMAL HIGH (ref 41–116)
BUN/Creatinine Ratio: 24 — ABNORMAL HIGH (ref 9–23)
BUN: 18 mg/dL (ref 6–20)
Bilirubin Total: <0.2 mg/dL (ref 0.0–1.2)
CO2: 21 mmol/L (ref 20–29)
Calcium: 9.7 mg/dL (ref 8.7–10.2)
Chloride: 109 mmol/L — ABNORMAL HIGH (ref 96–106)
Creatinine, Ser: 0.74 mg/dL (ref 0.57–1.00)
Globulin, Total: 2.2 g/dL (ref 1.5–4.5)
Glucose: 82 mg/dL (ref 70–99)
Potassium: 3.8 mmol/L (ref 3.5–5.2)
Sodium: 141 mmol/L (ref 134–144)
Total Protein: 6.1 g/dL (ref 6.0–8.5)
eGFR: 109 mL/min/1.73

## 2024-12-07 LAB — CYTOLOGY - PAP
Chlamydia: NEGATIVE
Comment: NEGATIVE
Comment: NEGATIVE
Comment: NORMAL
Diagnosis: NEGATIVE
Neisseria Gonorrhea: NEGATIVE
Trichomonas: NEGATIVE

## 2024-12-07 LAB — LIPID PANEL
Chol/HDL Ratio: 4.9 ratio — ABNORMAL HIGH (ref 0.0–4.4)
Cholesterol, Total: 181 mg/dL (ref 100–199)
HDL: 37 mg/dL — ABNORMAL LOW
LDL Chol Calc (NIH): 118 mg/dL — ABNORMAL HIGH (ref 0–99)
Triglycerides: 143 mg/dL (ref 0–149)
VLDL Cholesterol Cal: 26 mg/dL (ref 5–40)

## 2024-12-07 LAB — THYROID PANEL WITH TSH
Free Thyroxine Index: 1.5 (ref 1.2–4.9)
T3 Uptake Ratio: 23 % — AB (ref 24–39)
T4, Total: 6.6 ug/dL (ref 4.5–12.0)
TSH: 3.24 u[IU]/mL (ref 0.450–4.500)

## 2024-12-09 ENCOUNTER — Encounter: Payer: Self-pay | Admitting: Nurse Practitioner

## 2025-06-07 ENCOUNTER — Ambulatory Visit: Admitting: Family Medicine
